# Patient Record
Sex: Female | Born: 1945 | Race: White | Hispanic: No | Smoking: Never smoker
Health system: Southern US, Community
[De-identification: ages and names within clinical notes are randomized; demographics above are authoritative.]

## PROBLEM LIST (undated history)

## (undated) DIAGNOSIS — M1990 Unspecified osteoarthritis, unspecified site: Secondary | ICD-10-CM

## (undated) DIAGNOSIS — E785 Hyperlipidemia, unspecified: Secondary | ICD-10-CM

## (undated) DIAGNOSIS — N189 Chronic kidney disease, unspecified: Secondary | ICD-10-CM

## (undated) DIAGNOSIS — F32A Depression, unspecified: Secondary | ICD-10-CM

## (undated) DIAGNOSIS — I10 Essential (primary) hypertension: Secondary | ICD-10-CM

## (undated) DIAGNOSIS — M810 Age-related osteoporosis without current pathological fracture: Secondary | ICD-10-CM

## (undated) DIAGNOSIS — H269 Unspecified cataract: Secondary | ICD-10-CM

## (undated) DIAGNOSIS — F329 Major depressive disorder, single episode, unspecified: Secondary | ICD-10-CM

## (undated) DIAGNOSIS — R0602 Shortness of breath: Secondary | ICD-10-CM

## (undated) HISTORY — DX: Age-related osteoporosis without current pathological fracture: M81.0

## (undated) HISTORY — DX: Unspecified cataract: H26.9

## (undated) HISTORY — PX: CATARACT EXTRACTION: SUR2

## (undated) HISTORY — PX: ABDOMINAL HYSTERECTOMY: SHX81

## (undated) HISTORY — PX: CHOLECYSTECTOMY: SHX55

## (undated) HISTORY — DX: Shortness of breath: R06.02

## (undated) HISTORY — DX: Essential (primary) hypertension: I10

## (undated) HISTORY — DX: Unspecified osteoarthritis, unspecified site: M19.90

## (undated) HISTORY — PX: BREAST REDUCTION SURGERY: SHX8

## (undated) HISTORY — DX: Hyperlipidemia, unspecified: E78.5

## (undated) HISTORY — PX: REDUCTION MAMMAPLASTY: SUR839

## (undated) HISTORY — PX: LIPOSUCTION EXTREMITIES: SUR830

---

## 2005-02-13 HISTORY — PX: KNEE ARTHROSCOPY: SUR90

## 2008-01-28 ENCOUNTER — Ambulatory Visit (HOSPITAL_COMMUNITY): Admission: RE | Admit: 2008-01-28 | Discharge: 2008-01-28 | Payer: Self-pay | Admitting: Internal Medicine

## 2008-03-16 HISTORY — PX: FACIAL COSMETIC SURGERY: SHX629

## 2008-03-31 ENCOUNTER — Other Ambulatory Visit: Admission: RE | Admit: 2008-03-31 | Discharge: 2008-03-31 | Payer: Self-pay | Admitting: Internal Medicine

## 2008-03-31 ENCOUNTER — Ambulatory Visit: Payer: Self-pay | Admitting: Internal Medicine

## 2008-05-04 ENCOUNTER — Ambulatory Visit: Payer: Self-pay | Admitting: Internal Medicine

## 2008-08-03 ENCOUNTER — Ambulatory Visit: Payer: Self-pay | Admitting: Internal Medicine

## 2008-10-27 ENCOUNTER — Ambulatory Visit: Payer: Self-pay | Admitting: Internal Medicine

## 2009-02-04 ENCOUNTER — Ambulatory Visit (HOSPITAL_COMMUNITY): Admission: RE | Admit: 2009-02-04 | Discharge: 2009-02-04 | Payer: Self-pay | Admitting: Internal Medicine

## 2009-02-17 ENCOUNTER — Encounter: Admission: RE | Admit: 2009-02-17 | Discharge: 2009-02-17 | Payer: Self-pay | Admitting: Internal Medicine

## 2009-06-13 HISTORY — PX: CARPAL TUNNEL RELEASE: SHX101

## 2009-06-13 HISTORY — PX: TRIGGER FINGER RELEASE: SHX641

## 2009-06-22 ENCOUNTER — Ambulatory Visit (HOSPITAL_BASED_OUTPATIENT_CLINIC_OR_DEPARTMENT_OTHER): Admission: RE | Admit: 2009-06-22 | Discharge: 2009-06-22 | Payer: Self-pay | Admitting: Orthopedic Surgery

## 2009-08-03 ENCOUNTER — Ambulatory Visit: Payer: Self-pay | Admitting: Internal Medicine

## 2009-09-07 ENCOUNTER — Ambulatory Visit: Payer: Self-pay | Admitting: Internal Medicine

## 2009-09-16 ENCOUNTER — Ambulatory Visit: Payer: Self-pay | Admitting: Internal Medicine

## 2010-01-19 ENCOUNTER — Ambulatory Visit
Admission: RE | Admit: 2010-01-19 | Discharge: 2010-01-19 | Payer: Self-pay | Source: Home / Self Care | Attending: Orthopedic Surgery | Admitting: Orthopedic Surgery

## 2010-02-08 ENCOUNTER — Ambulatory Visit
Admission: RE | Admit: 2010-02-08 | Discharge: 2010-02-08 | Payer: Self-pay | Source: Home / Self Care | Attending: Orthopedic Surgery | Admitting: Orthopedic Surgery

## 2010-02-11 ENCOUNTER — Ambulatory Visit (HOSPITAL_COMMUNITY)
Admission: RE | Admit: 2010-02-11 | Discharge: 2010-02-11 | Payer: Self-pay | Source: Home / Self Care | Attending: Internal Medicine | Admitting: Internal Medicine

## 2010-02-11 LAB — HM MAMMOGRAPHY

## 2010-03-01 ENCOUNTER — Ambulatory Visit
Admission: RE | Admit: 2010-03-01 | Discharge: 2010-03-01 | Payer: Self-pay | Source: Home / Self Care | Attending: Internal Medicine | Admitting: Internal Medicine

## 2010-03-07 ENCOUNTER — Ambulatory Visit (HOSPITAL_COMMUNITY)
Admission: RE | Admit: 2010-03-07 | Discharge: 2010-03-07 | Payer: Self-pay | Source: Home / Self Care | Attending: Orthopaedic Surgery | Admitting: Orthopaedic Surgery

## 2010-03-10 ENCOUNTER — Ambulatory Visit: Admit: 2010-03-10 | Payer: Self-pay | Admitting: Internal Medicine

## 2010-04-04 NOTE — Op Note (Signed)
  NAME:  Kelly [name], Kelly [name]             ACCOUNT NO.:  [id number]  MEDICAL RECORD NO.:  [id number]          PATIENT TYPE:  AMB  LOCATION:  DSC                          FACILITY:  [facility]  PHYSICIAN:  Cindee Salt, M.D.       DATE OF BIRTH:  10/31/1945  DATE OF PROCEDURE:  01/19/2010 DATE OF DISCHARGE:                              OPERATIVE REPORT   PREOPERATIVE DIAGNOSIS:  Stenosing tenosynovitis, right thumb.  POSTOPERATIVE DIAGNOSIS:  Stenosing tenosynovitis, right thumb.  OPERATION:  Release of A1 pulley right thumb.  SURGEON:  Cindee Salt, MD  ASSISTANT:  Carolyne Fiscal, RN  ANESTHESIA:  General with local infiltration.  ANESTHESIOLOGIST:  Bedelia Person, MD  HISTORY:  The patient is a 65 year old female with a history of triggering of the right thumb.  This has not responded to conservative treatment.  She has elected to undergo surgical decompression.  Pre, peri, postoperative course have been discussed along with risks and complications.  She is aware that there is no guarantee with the surgery, possibility of infection, recurrence of injury to arteries, nerves, tendons, incomplete relief of symptoms, dystrophy.  In the preoperative area, the patient is seen, the extremity marked by both the patient and surgeon, antibiotic given.  PROCEDURE:  The patient was brought to the operating room where a general anesthetic was carried out without difficulty.  She was prepped using ChloraPrep, supine position, right arm free.  A 3-minute dry time was allowed, time-out taken, confirming the patient and procedure, and a transverse incision was made over the A1 pulley of the right thumb, carried down through subcutaneous tissue.  Bleeders were electrocauterized.  Neurovascular structures identified and protected with a right-angle retractors.  A1 pulley was isolated.  An incision was then made on its radial aspect.  This was found to be markedly thickened with the deformity present to the FPL tendon.   On release, full flexion and extension without any further triggering was noted.  The wound was irrigated.  The skin closed with interrupted 5-0 Vicryl Rapide sutures. A local infiltration with 0.25% Marcaine without epinephrine was given, approximately 2 mL was used.  A sterile compressive dressing was applied with the fingers free.  The patient tolerated the procedure well and was taken to the recovery room for observation in satisfactory condition. She will be discharged home to return to the [facility] [place] in 1 week on Vicodin.          ______________________________ Cindee Salt, M.D.     GK/MEDQ  D:  01/19/2010  T:  01/19/2010  Job:  [id number]  cc:   Luanna Cole. Lenord Fellers, M.D.  Electronically Signed by Cindee Salt M.D. on 04/04/2010 12:32:26 PM

## 2010-04-04 NOTE — Op Note (Signed)
NAME:  Kelly [name], Kelly [name]             ACCOUNT NO.:  [id number]  MEDICAL RECORD NO.:  [id number]          PATIENT TYPE:  AMB  LOCATION:  DSC                          FACILITY:  [facility]  PHYSICIAN:  Cindee Salt, M.D.       DATE OF BIRTH:  [birth year]  DATE OF PROCEDURE:  02/08/2010 DATE OF DISCHARGE:                              OPERATIVE REPORT   PREOPERATIVE DIAGNOSES: 1. Carpal tunnel syndrome, left hand. 2. Stenosing tenosynovitis, left thumb.  POSTOPERATIVE DIAGNOSES: 1. Carpal tunnel syndrome, left hand. 2. Stenosing tenosynovitis, left thumb.  OPERATIONS: 1. Release of carpal tunnel, left hand. 2. Release of A1 pulley, left thumb.  SURGEON:  Cindee Salt, MD  ASSISTANT:  Carolyne Fiscal, RN  ANESTHESIA:  Forearm-based IV regional.  ANESTHESIOLOGIST:  Sheldon Silvan, MD  HISTORY:  The patient is a 65 year old female with a history of carpal tunnel syndrome, EMG nerve conductions positive.  She has elected to undergo surgical decompression in the preoperative area.  She is complaining of catching of her left thumb and is desirous having this released having had release done on the opposite side.  Pre, peri, and postoperative course have been discussed along with risks and complications.  She is aware that there is no guarantee with surgery, possibility of infection, recurrence injury to arteries, nerves, and tendons, incomplete relief of symptoms, and dystrophy.  In the preoperative area, the patient is seen, the extremity is marked by both the patient and surgeon, and antibiotic is given.  PROCEDURE:  The patient was brought to the operating room where a forearm-based IV regional anesthetic was carried out without difficulty. She was prepped using ChloraPrep, supine position with left arm free.  A 3-minute dry time was allowed.  A time-out was taken confirming the patient and procedure.  A transverse incision was made over the A1 pulley of left thumb and carried down through the  subcutaneous tissue. Bleeders were electrocauterized with bipolar.  Radial and ulnar neurovascular bundles were identified and protected to the radial side of the A1 pulley.  The A1 pulley was released with sharp dissection. Significant thickening was present.  No further lesions were identified. The thumb was placed through full range motion.  No further triggering was evident.  The wound was irrigated and closed with interrupted 5-0 Vicryl Rapide sutures.  A separate incision was then made longitudinally in the palm and carried down through the subcutaneous tissue.  Bleeders were again electrocauterized with bipolar.  Palmar fascia was split. She had some feeling prior to the incisions.  Both were injected with 0.25% Marcaine without epinephrine.  The wound was then deepened,  the flexor tendon to the ring little finger was identified after identification of the superficial palmar arch to the ulnar side of the carpal retinaculum.  This was incised with sharp dissection.  Right angle and Sewall retractor were placed between skin and forearm fascia. The fascia was released for approximately a centimeter and half proximal to the wrist crease under direct vision.  Canal was explored.  Air compression to the nerve was apparent with thickening of the tenosynovium tissue.  No further lesions were identified.  The wound  was irrigated.  The skin was closed with interrupted 5-0 Vicryl Rapide suture.  A further infiltration with 0.25% Marcaine was given, approximately 6 mL was used in total.  A sterile compressive dressing and splint to the wrist with the fingers and thumb free was applied.  On deflation of the tourniquet, all fingers immediately pinked.  She was taken to the recovery room for observation in satisfactory condition. She will be discharged home to return the [facility] [place] in 1 week on Vicodin.          ______________________________ Cindee Salt, M.D.     GK/MEDQ   D:  02/08/2010  T:  02/08/2010  Job:  [id number]  Electronically Signed by Cindee Salt M.D. on 04/04/2010 12:32:42 PM

## 2010-04-11 ENCOUNTER — Encounter: Payer: Self-pay | Admitting: Internal Medicine

## 2010-04-11 ENCOUNTER — Ambulatory Visit (INDEPENDENT_AMBULATORY_CARE_PROVIDER_SITE_OTHER): Payer: Medicare Other | Admitting: Internal Medicine

## 2010-04-11 ENCOUNTER — Other Ambulatory Visit: Payer: Self-pay | Admitting: Internal Medicine

## 2010-04-11 DIAGNOSIS — R197 Diarrhea, unspecified: Secondary | ICD-10-CM

## 2010-04-11 DIAGNOSIS — D509 Iron deficiency anemia, unspecified: Secondary | ICD-10-CM

## 2010-04-11 DIAGNOSIS — Z9283 Personal history of failed moderate sedation: Secondary | ICD-10-CM

## 2010-04-11 LAB — FERRITIN: Ferritin: 19.1 ng/mL (ref 10.0–291.0)

## 2010-04-12 ENCOUNTER — Ambulatory Visit (INDEPENDENT_AMBULATORY_CARE_PROVIDER_SITE_OTHER): Payer: Medicare Other | Admitting: Internal Medicine

## 2010-04-12 DIAGNOSIS — D649 Anemia, unspecified: Secondary | ICD-10-CM

## 2010-04-14 ENCOUNTER — Telehealth (INDEPENDENT_AMBULATORY_CARE_PROVIDER_SITE_OTHER): Payer: Self-pay

## 2010-04-15 ENCOUNTER — Ambulatory Visit: Payer: Medicare Other | Admitting: Nurse Practitioner

## 2010-04-15 ENCOUNTER — Telehealth: Payer: Self-pay | Admitting: Internal Medicine

## 2010-04-21 NOTE — Assessment & Plan Note (Signed)
Summary:  Iron deficiency anemia , diarrhea. Requesting ECL  (Dr.Baxley)   History of Present Illness Visit Type: Initial Consult Primary GI MD: Yancey Flemings MD Primary Provider: Luanna Cole. Lenord Fellers, MD Requesting Provider: Luanna Cole. Baxley, MD Chief Complaint: Patient complains of diarrhea for about 7 months and she has started taking hydrocodone for her knee and her diarrhea has resolved. She now says that she is constipated. History of Present Illness:    65 year old female with hypertension, hyperlipidemia, and osteoarthritis. She is sent today regarding recent problems with diarrhea and iron deficiency anemia. patient reports chronic use of Aleve for joint pain. She also takes a baby aspirin daily. she was having diarrhea for about 7 months.  due to renal insufficiency, leave was discontinued and hydrocodone started. Diarrhea resolved. Now some constipation. She apparently had a routine colonoscopy greater than 5 years ago [place]. The details. Recent laboratories revealed anemia with hemoglobin of 10.6. Normal MCV. Low at 25. No ferritin. Normal B12. No folate. GI review of systems is otherwise negative. She has had mild weight gain. No melena or hematochezia. No blood  donation.   GI Review of Systems      Denies abdominal pain, acid reflux, belching, bloating, chest pain, dysphagia with liquids, dysphagia with solids, heartburn, loss of appetite, nausea, vomiting, vomiting blood, weight loss, and  weight gain.      Reports change in bowel habits, constipation, and  diarrhea.     Denies anal fissure, black tarry stools, diverticulosis, fecal incontinence, heme positive stool, hemorrhoids, irritable bowel syndrome, jaundice, light color stool, liver problems, rectal bleeding, and  rectal pain. Preventive Screening-Counseling & Management  Alcohol-Tobacco     Smoking Status: never      Drug Use:  no.      Current Medications (verified): 1)  Vicodin 5-500 Mg Tabs  (Hydrocodone-Acetaminophen) .... Take One By Mouth Two Times A Day 2)  Lisinopril 20 Mg Tabs (Lisinopril) .Marland Kitchen.. 1 By Mouth Once Daily 3)  Crestor 20 Mg Tabs (Rosuvastatin Calcium) .Marland Kitchen.. 1 By Mouth Once Daily 4)  Fish Oil 1000 Mg Caps (Omega-3 Fatty Acids) .Marland Kitchen.. 1 By Mouth Once Daily 5)  Calcium 600 1500 Mg Tabs (Calcium Carbonate) .Marland Kitchen.. 1 By Mouth Once Daily 6)  Vitamin D3 1000 Unit Tabs (Cholecalciferol) .Marland Kitchen.. 1 By Mouth Once Daily 7)  Multivitamins  Tabs (Multiple Vitamin) .Marland Kitchen.. 1 By Mouth Once Daily 8)  Vitamin B-12 Cr 2000 Mcg Cr-Tabs (Cyanocobalamin) .Marland Kitchen.. 1 By Mouth Once Daily 9)  Aspirin 81 Mg Tbec (Aspirin) .Marland Kitchen.. 1 By Mouth Once Daily  Allergies (verified): No Known Drug Allergies  Past History:  Past Medical History: Iron Deficiency Anemia HTN Hyperlipidemia  Past Surgical History: Knee Replacement left Carpal Tunnel Release ad trigger fingers bilateral   Family History: No FH of Colon Cancer: Family History of Lung Cancer: Sister (smoker) Family History of Ovarian Cancer: daughter deceased Family History of Heart Disease: sister  Social History: Occupation: retired Patient has never smoked.  Alcohol Use - yes 2 per day Daily Caffeine Use 2 per day Illicit Drug Use - no Smoking Status:  never Drug Use:  no  Review of Systems       The patient complains of arthritis/joint pain and swelling of feet/legs.  The patient denies allergy/sinus, anemia, anxiety-new, back pain, blood in urine, breast changes/lumps, change in vision, confusion, cough, coughing up blood, depression-new, fainting, fatigue, fever, headaches-new, hearing problems, heart murmur, heart rhythm changes, itching, menstrual pain, muscle pains/cramps, night sweats, nosebleeds, pregnancy symptoms,  shortness of breath, skin rash, sleeping problems, sore throat, swollen lymph glands, thirst - excessive , urination - excessive , urination changes/pain, urine leakage, vision changes, and voice change.    Vital  Signs:  Patient profile:   65 year old female Height:      61 inches Weight:      204.6 pounds BMI:     38.80 Pulse rate:   76 / minute Pulse rhythm:   regular BP sitting:   120 / 62  (left arm) Cuff size:   regular  Vitals Entered By: Harlow Mares CMA Duncan Dull) (April 11, 2010 3:44 PM)  Physical Exam  General:  Well developed, well nourished, no acute distress. Head:  Normocephalic and atraumatic. Eyes:  PERRLA, no icterus. Ears:  Normal auditory acuity. Nose:  No deformity, discharge,  or lesions. Mouth:  No deformity or lesions, dentition normal. Neck:  Supple; no masses or thyromegaly. Lungs:  Clear throughout to auscultation. Heart:  Regular rate and rhythm; no murmurs, rubs,  or bruits. Abdomen:  Soft, nontender and nondistended. No masses, hepatosplenomegaly or hernias noted. Normal bowel sounds. Rectal:   deferred until colonoscopy Msk:  Symmetrical with no gross deformities. Normal posture. Pulses:  Normal pulses noted. Extremities:  No clubbing, cyanosis, edema or deformities noted. Neurologic:  Alert and  oriented x4;  grossly normal neurologically. Skin:  Intact without significant lesions or rashes. Psych:  Alert and cooperative. Normal mood and affect.   Impression & Recommendations:  Problem # 1:  UNSPECIFIED IRON DEFICIENCY ANEMIA (ICD-280.9)  anemia, possibly iron deficiency. This in the face of renal insufficiency. rule out GI blood loss. Risk factors include Ansaid/aspirin. Also, with diarrhea , rule out iron absorption problem such as celiac sprue   Plan : #1. tissue trans- glutaminase antibodies , ferritin level  #2. colonoscopy and upper endoscopy. The nature of the procedures as well as the risks, benefits, and alternatives were reviewed. she understood and agreed to proceed.  #3. movi the prep prescribed. The patient instructed on  its use  Problem # 2:  DIARRHEA (ICD-787.91)  previous problems with chronic diarrhea. They have been a side effect  of NSAIDs, such as microscopic colitis. Improve thereafter and with  hydrocodone. also, rule out celiac disease. plan as outlined above.  Problem # 3:  PERSONAL HISTORY OF FAILED MODERATE SEDATION (ICD-V15.80)  difficulties with her last procedure and [place] regarding adequate sedation. Will perform the exam with propofol /MAC  Other Orders: Colon/Endo (Colon/Endo) TLB-Ferritin ([id number]) T-Tissue Transglutamase Ab IgA ([id number])  Patient Instructions: 1)  Colon/Endo [facility] 04/22/10 with propoful at 11:00 am arrive at 10:00 am 2)  Movi prep instructions given. 3)  Movi prep prescription sent to your pharmacy for you to pick up. 4)  Labs ordered for you to have drawn today on basement floor. 5)  Colonoscopy and Flexible Sigmoidoscopy brochure given.  6)  Upper Endoscopy brochure given.  7)  Copy sent to : Luanna Cole. Lenord Fellers, MD 8)  The medication list was reviewed and reconciled.  All changed / newly prescribed medications were explained.  A complete medication list was provided to the patient / caregiver.

## 2010-04-21 NOTE — Letter (Signed)
Summary: [facility] Instructions  [facility]  [address] [place], [place] [address]   Phone: [phone]  Fax: [phone]       Kelly [name]    [birth year]    MRN: [id number]        Procedure Day /Date:FRIDAY, 04/22/10     Arrival Time:10:00 AM     Procedure Time:11:00 AM     Location of Procedure:                    X  [facility] (4th Floor)  PREPARATION FOR COLONOSCOPY WITH MOVIPREP/ENDO/WITH PROPOFUL   Starting 5 days prior to your procedure 04/17/10 do not eat nuts, seeds, popcorn, corn, beans, peas,  salads, or any raw vegetables.  Do not take any fiber supplements (e.g. Metamucil, Citrucel, and Benefiber).  THE DAY BEFORE YOUR PROCEDURE         DATE: 04/21/10 DAY: THURSDAY  1.  Drink clear liquids the entire day-NO SOLID FOOD  2.  Do not drink anything colored red or purple.  Avoid juices with pulp.  No orange juice.  3.  Drink at least 64 oz. (8 glasses) of fluid/clear liquids during the day to prevent dehydration and help the prep work efficiently.  CLEAR LIQUIDS INCLUDE: Water Jello Ice Popsicles Tea (sugar ok, no milk/cream) Powdered fruit flavored drinks Coffee (sugar ok, no milk/cream) Gatorade Juice: apple, white grape, white cranberry  Lemonade Clear bullion, consomm, broth Carbonated beverages (any kind) Strained chicken noodle soup Hard Candy                             4.  In the morning, mix first dose of MoviPrep solution:    Empty 1 Pouch A and 1 Pouch B into the disposable container    Add lukewarm drinking water to the top line of the container. Mix to dissolve    Refrigerate (mixed solution should be used within 24 hrs)  5.  Begin drinking the prep at 5:00 p.m. The MoviPrep container is divided by 4 marks.   Every 15 minutes drink the solution down to the next mark (approximately 8 oz) until the full liter is complete.   6.  Follow completed prep with 16 oz of clear liquid of your choice (Nothing red or  purple).  Continue to drink clear liquids until bedtime.  7.  Before going to bed, mix second dose of MoviPrep solution:    Empty 1 Pouch A and 1 Pouch B into the disposable container    Add lukewarm drinking water to the top line of the container. Mix to dissolve    Refrigerate  THE DAY OF YOUR PROCEDURE      DATE: 3/9/12DAY:FRIDAY  Beginning at 6:00 a.m. (5 hours before procedure):         1. Every 15 minutes, drink the solution down to the next mark (approx 8 oz) until the full liter is complete.  2. Follow completed prep with 16 oz. of clear liquid of your choice.    3. You may drink clear liquids until 9:00 AM (2 HOURS BEFORE PROCEDURE).   MEDICATION INSTRUCTIONS  Unless otherwise instructed, you should take regular prescription medications with a small sip of water   as early as possible the morning of your procedure.           OTHER INSTRUCTIONS  You will need a responsible adult at least 67 years of age to  accompany you and drive you home.   This person must remain in the waiting room during your procedure.  Wear loose fitting clothing that is easily removed.  Leave jewelry and other valuables at home.  However, you may wish to bring a book to read or  an iPod/MP3 player to listen to music as you wait for your procedure to start.  Remove all body piercing jewelry and leave at home.  Total time from sign-in until discharge is approximately 2-3 hours.  You should go home directly after your procedure and rest.  You can resume normal activities the  day after your procedure.  The day of your procedure you should not:   Drive   Make legal decisions   Operate machinery   Drink alcohol   Return to work  You will receive specific instructions about eating, activities and medications before you leave.    The above instructions have been reviewed and explained to me by   _______________________    I fully understand and can verbalize these  instructions _____________________________ Date _________

## 2010-04-21 NOTE — Progress Notes (Signed)
Summary: Diarrhea/abdominal pain  Phone Note Call from Patient Call back at Home Phone [phone]   Caller: Patient Call For: Dr. Marina Goodell Summary of Call: Diarrhea/Cramping  Initial call taken by: Selinda Michaels RN,  April 14, 2010 4:12 PM  Follow-up for Phone Call        Patient states that when she saw Dr. Marina Goodell she was not having pain or diarrhea. Patient states that Tuesday she started having diarrhea and abdominal pain. States that the pain is above her belly button all across her abdomen and it hits her right before she has diarrhea. Patient states she took a whole bottle of Kaopectate Tuesday. She did not eat anything yesterday but today she had her coffee, cereal, and fruit. She has been having diarrhea ever since, about every 10-15 min. Patient scheduled for colon with propofol on 04/22/10. Dr. Marina Goodell please advise. Follow-up by: Selinda Michaels RN,  April 14, 2010 4:15 PM  Additional Follow-up for Phone Call Additional follow up Details #1::        likely a gastroenteritis. Recommend fluids, immodium;  and rest. Make an appt w/ extender tomorrow or Monday. If she's better, she can cancel the extender visit... keep plans for scheduled procedures for now. Additional Follow-up by: Hilarie Fredrickson MD,  April 14, 2010 4:32 PM    Additional Follow-up for Phone Call Additional follow up Details #2::    Patient aware of Dr. Lamar Sprinkles recommendations. Patient given an appointment to see Willette Cluster, RNP 04/15/10@10am . Patient aware of appointment date and time. Follow-up by: Selinda Michaels RN,  April 14, 2010 4:40 PM

## 2010-04-21 NOTE — Progress Notes (Signed)
Summary: Appts  Phone Note Call from Patient Call back at Home Phone [phone]   Caller: Patient Call For: Willette Cluster Reason for Call: Talk to Nurse Summary of Call: Patient canceled her appt for today because she says that she is feeling better but she wants to move her procedure to earlier next week so she doesnt have to wait untill friday Initial call taken by: [place] Johnson,  April 15, 2010 8:29 AM  Follow-up for Phone Call        Spoke with patient and explained that with the "special anesthesia" she would be getting, we cannot move her procedure to eariler in the week.  Follow-up by: Jesse Fall RN,  April 15, 2010 10:37 AM    New/Updated Medications: MOVIPREP 100 GM  SOLR (PEG-KCL-NACL-NASULF-NA ASC-C) As per prep instructions. Prescriptions: MOVIPREP 100 GM  SOLR (PEG-KCL-NACL-NASULF-NA ASC-C) As per prep instructions.  #1 x 0   Entered by:   Milford Cage NCMA   Authorized by:   Hilarie Fredrickson MD   Signed by:   Milford Cage NCMA on 04/15/2010   Method used:   Electronically to        [id number] (retail)       [address] [place], [place]  [address]       Ph: [phone]       Fax: [phone]   RxID:   [id number]

## 2010-04-22 ENCOUNTER — Encounter: Payer: Self-pay | Admitting: Internal Medicine

## 2010-04-22 ENCOUNTER — Other Ambulatory Visit: Payer: Self-pay | Admitting: Internal Medicine

## 2010-04-22 ENCOUNTER — Encounter (AMBULATORY_SURGERY_CENTER): Payer: Medicare Other | Admitting: Internal Medicine

## 2010-04-22 DIAGNOSIS — D649 Anemia, unspecified: Secondary | ICD-10-CM

## 2010-04-22 DIAGNOSIS — D126 Benign neoplasm of colon, unspecified: Secondary | ICD-10-CM

## 2010-04-22 DIAGNOSIS — R197 Diarrhea, unspecified: Secondary | ICD-10-CM

## 2010-04-25 LAB — BASIC METABOLIC PANEL
BUN: 24 mg/dL — ABNORMAL HIGH (ref 6–23)
CO2: 27 mEq/L (ref 19–32)
Calcium: 9.3 mg/dL (ref 8.4–10.5)
Creatinine, Ser: 1.73 mg/dL — ABNORMAL HIGH (ref 0.4–1.2)
GFR calc non Af Amer: 29 mL/min — ABNORMAL LOW (ref >60)
Glucose, Bld: 118 mg/dL — ABNORMAL HIGH (ref 70–99)

## 2010-04-26 LAB — BASIC METABOLIC PANEL
BUN: 14 mg/dL (ref 6–23)
CO2: 26 mEq/L (ref 19–32)
Calcium: 9.3 mg/dL (ref 8.4–10.5)
Creatinine, Ser: 1.36 mg/dL — ABNORMAL HIGH (ref 0.4–1.2)
Glucose, Bld: 98 mg/dL (ref 70–99)
Sodium: 138 mEq/L (ref 135–145)

## 2010-04-26 NOTE — Procedures (Addendum)
Summary: Upper Endoscopy  Patient: Kelly [name] Note: All result statuses are Final unless otherwise noted.  Tests: (1) Upper Endoscopy (EGD)   EGD Upper Endoscopy       DONE     [facility]     [address]     [place], [place]  [address]          ENDOSCOPY PROCEDURE REPORT          PATIENT:  Kelly, [name]  MR#:  [id number]     BIRTHDATE:  April 10, 1945, 64 yrs. old  GENDER:  female          ENDOSCOPIST:  Wilhemina Bonito. Eda Keys, MD     Referred by:  Sharlet Salina, M.D.          PROCEDURE DATE:  04/22/2010     PROCEDURE:  EGD with biopsy, [id number]     ASA CLASS:  Class II     INDICATIONS:  normocytic anemia, diarrhea .  Normal B12, Ferritin,     and TTG-ab          MEDICATIONS:   MAC sedation, administered by CRNA, There was     residual sedation effect present from prior procedure., propofol     (Diprivan) 180 mg IV     TOPICAL ANESTHETIC:  Exactacain Spray          DESCRIPTION OF PROCEDURE:   After the risks benefits and     alternatives of the procedure were thoroughly explained, informed     consent was obtained.  The LB GIF-H180 [id number] endoscope was     introduced through the mouth and advanced to the third portion of     the duodenum, without limitations.  The instrument was slowly     withdrawn as the mucosa was fully examined.     <<PROCEDUREIMAGES>>          The upper, middle, and distal third of the esophagus were     carefully inspected and no abnormalities were noted. The z-line     was well seen at the GEJ. The endoscope was pushed into the fundus     which was normal including a retroflexed view. The antrum,gastric     body (somewhat atrophic), first and second part of the duodenum     were unremarkable.    Retroflexed views revealed no abnormalities.     The scope was then withdrawn from the patient and the procedure     completed.          COMPLICATIONS:  None          ENDOSCOPIC IMPRESSION:     1) Normal EGD     2) No GI cause for  normocytic anemia found          RECOMMENDATIONS:     1) Await biopsy results     2) Return to the care of Dr. Lenord Fellers for further assessment of     anemia (might consider hematology opinion)          ______________________________     Wilhemina Bonito. Eda Keys, MD          CC:  Sharlet Salina, MD;  The Patient          n.     eSIGNED:   Wilhemina Bonito. Eda Keys at 04/22/2010 12:09 PM          Merril Abbe, [id number]  Note: An exclamation mark (!) indicates a result that was not dispersed into  the flowsheet. Document Creation Date: 04/22/2010 12:09 PM _______________________________________________________________________  (1) Order result status: Final Collection or observation date-time: 04/22/2010 11:56 Requested date-time:  Receipt date-time:  Reported date-time:  Referring Physician:   Ordering Physician: Fransico Setters [phone]) Specimen Source:  Source: Launa Grill Order Number: [id number] Lab site:

## 2010-04-26 NOTE — Procedures (Addendum)
Summary: Colonoscopy  Patient: Kelly [name] Note: All result statuses are Final unless otherwise noted.  Tests: (1) Colonoscopy (COL)   COL Colonoscopy           DONE     [facility]     [address]     [place], [place]  [address]          COLONOSCOPY PROCEDURE REPORT          PATIENT:  [name], [name]  MR#:  [id number]     BIRTHDATE:  27-Dec-1945, 64 yrs. old  GENDER:  female     ENDOSCOPIST:  Wilhemina Bonito. Eda Keys, MD     REF. BY:  Sharlet Salina, M.D.     PROCEDURE DATE:  04/22/2010     PROCEDURE:  Colonoscopy with biopsies     ASA CLASS:  Class II     INDICATIONS:  Anemia, unexplained diarrhea     MEDICATIONS:   MAC sedation, administered by CRNA, propofol     (Diprivan) 230 mcg IV          DESCRIPTION OF PROCEDURE:   After the risks benefits and     alternatives of the procedure were thoroughly explained, informed     consent was obtained.  Digital rectal exam was performed and     revealed no abnormalities.   The LB 180AL [id number] endoscope was     introduced through the anus and advanced to the cecum, which was     identified by both the appendix and ileocecal valve, without     limitations.Time to cecum = 7:49 min. The quality of the prep was     good, using MiraLax.  The instrument was then slowly withdrawn     (time = 13:52 min) as the colon was fully examined.     <<PROCEDUREIMAGES>>          FINDINGS:  The terminal ileum appeared normal.  A normal appearing     cecum, ileocecal valve, and appendiceal orifice were identified.     The ascending, hepatic flexure, transverse, splenic flexure,     descending, sigmoid colon, and rectum appeared unremarkable.     Retroflexed views in the rectum revealed no abnormalities.Random     colon bx taken.    The scope was then withdrawn from the patient     and the procedure completed.          COMPLICATIONS:  None          ENDOSCOPIC IMPRESSION:     1) Normal terminal ileum     2) Normal colon       RECOMMENDATIONS:     1) Await biopsy results     2) Continue current colorectal screening recommendations for     "routine risk" patients with a repeat colonoscopy in 10 years.     3) Upper endoscopy will be performed today          ______________________________     Wilhemina Bonito. Eda Keys, MD          CC:  Sharlet Salina, MD The Patient          n.     eSIGNED:   Wilhemina Bonito. Eda Keys at 04/22/2010 11:51 AM          Merril Abbe, [id number]  Note: An exclamation mark (!) indicates a result that was not dispersed into the flowsheet. Document Creation Date: 04/22/2010 11:52 AM _______________________________________________________________________  (1) Order result status: Final Collection  or observation date-time: 04/22/2010 11:43 Requested date-time:  Receipt date-time:  Reported date-time:  Referring Physician:   Ordering Physician: Fransico Setters [phone]) Specimen Source:  Source: Launa Grill Order Number: [id number] Lab site:   Appended Document: Colonoscopy     Procedures Next Due Date:    Colonoscopy: 04/2020

## 2010-04-30 ENCOUNTER — Encounter: Payer: Self-pay | Admitting: Internal Medicine

## 2010-05-03 LAB — BASIC METABOLIC PANEL
CO2: 29 mEq/L (ref 19–32)
Chloride: 108 mEq/L (ref 96–112)
Creatinine, Ser: 1.31 mg/dL — ABNORMAL HIGH (ref 0.4–1.2)
GFR calc Af Amer: 49 mL/min — ABNORMAL LOW (ref >60)
Glucose, Bld: 97 mg/dL (ref 70–99)

## 2010-05-03 NOTE — Letter (Addendum)
Summary: Patient Notice- Colon Biospy Results  [facility]  [address] [place], [place] [address]   Phone: [phone]  Fax: [phone]        April 30, 2010 MRN: [id number]    [facility] [address] [place], [place]  [address]    Dear Kelly [name],  I am pleased to inform you that the biopsies taken during your recent colonoscopy and upper endoscopy were normal did not show any evidence of problems leading to anemia or diarrhea.  Additional information/recommendations:  __No further action is needed at this time.  Please follow-up with      your primary care physician for further assessment of anemia and your other healthcare needs.   __You should have a repeat colonoscopy examination for routine cancer screening in 10 years.    Please call us if you are having persistent problems or have questions about your condition that have not been fully answered at this time.  Sincerely,  Hilarie Fredrickson MD   This letter has been electronically signed by your physician.  Appended Document: Patient Notice- Colon Biospy Results letter mailed

## 2010-06-21 ENCOUNTER — Telehealth: Payer: Self-pay | Admitting: Internal Medicine

## 2010-06-21 NOTE — Telephone Encounter (Signed)
Call in Rx to [facility] as we have no samples. Give 3 month supply. Dose is 5 mg daily.

## 2010-07-13 ENCOUNTER — Other Ambulatory Visit: Payer: Self-pay | Admitting: Internal Medicine

## 2010-07-14 ENCOUNTER — Other Ambulatory Visit: Payer: Self-pay

## 2010-07-14 MED ORDER — HYDROCODONE-ACETAMINOPHEN 5-500 MG PO TABS: 1.0000 | ORAL_TABLET | Freq: Four times a day (QID) | ORAL | Status: DC | PRN

## 2010-10-07 ENCOUNTER — Encounter: Payer: Self-pay | Admitting: Internal Medicine

## 2010-10-11 ENCOUNTER — Other Ambulatory Visit: Payer: Medicare Other | Admitting: Internal Medicine

## 2010-10-11 DIAGNOSIS — E785 Hyperlipidemia, unspecified: Secondary | ICD-10-CM

## 2010-10-11 DIAGNOSIS — E119 Type 2 diabetes mellitus without complications: Secondary | ICD-10-CM

## 2010-10-12 LAB — HEPATIC FUNCTION PANEL
ALT: 11 U/L (ref 0–35)
AST: 16 U/L (ref 0–37)
Albumin: 4.2 g/dL (ref 3.5–5.2)
Total Bilirubin: 0.6 mg/dL (ref 0.3–1.2)

## 2010-10-12 LAB — LIPID PANEL
HDL: 29 mg/dL — ABNORMAL LOW (ref >39)
Total CHOL/HDL Ratio: 6.3 Ratio
VLDL: 45 mg/dL — ABNORMAL HIGH (ref 0–40)

## 2010-10-12 LAB — HEMOGLOBIN A1C: Hgb A1c MFr Bld: 5.7 % — ABNORMAL HIGH (ref <5.7)

## 2010-10-13 ENCOUNTER — Ambulatory Visit (INDEPENDENT_AMBULATORY_CARE_PROVIDER_SITE_OTHER): Payer: Medicare Other | Admitting: Internal Medicine

## 2010-10-13 ENCOUNTER — Encounter: Payer: Self-pay | Admitting: Internal Medicine

## 2010-10-13 VITALS — BP 118/72 | HR 72 | Temp 97.2°F | Ht 61.0 in | Wt 189.0 lb

## 2010-10-13 DIAGNOSIS — D649 Anemia, unspecified: Secondary | ICD-10-CM

## 2010-10-13 DIAGNOSIS — M1990 Unspecified osteoarthritis, unspecified site: Secondary | ICD-10-CM | POA: Insufficient documentation

## 2010-10-13 DIAGNOSIS — I10 Essential (primary) hypertension: Secondary | ICD-10-CM | POA: Insufficient documentation

## 2010-10-13 DIAGNOSIS — E785 Hyperlipidemia, unspecified: Secondary | ICD-10-CM | POA: Insufficient documentation

## 2010-10-13 DIAGNOSIS — E119 Type 2 diabetes mellitus without complications: Secondary | ICD-10-CM

## 2010-10-13 DIAGNOSIS — N289 Disorder of kidney and ureter, unspecified: Secondary | ICD-10-CM

## 2010-10-13 LAB — CBC WITH DIFFERENTIAL/PLATELET
Basophils Relative: 0 % (ref 0–1)
Eosinophils Absolute: 0.1 10*3/uL (ref 0.0–0.7)
MCH: 29.4 pg (ref 26.0–34.0)
MCHC: 32.3 g/dL (ref 30.0–36.0)
Neutrophils Relative %: 50 % (ref 43–77)
Platelets: 245 10*3/uL (ref 150–400)
RBC: 4.25 MIL/uL (ref 3.87–5.11)

## 2010-10-13 NOTE — Progress Notes (Signed)
  Subjective:    Patient ID: Kelly [name], female    DOB: 12/08/1945, 65 y.o.   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 She has had recent fasting lab work which was reviewed with her today and is within normal limits. Hemoglobin A1c is stable on diet alone. She's taking Crestor 10 mg daily because she said Lipitor caused arthralgias. Still has musculoskeletal pain in her knees due to osteoarthritis. We don't want her taking anti-inflammatory medications to have prescribed Vicodin 5/500 #61 by mouth every 12 hours when necessary pain with 3 refills. She had arthroscopic surgery of her left knee January 2012.  History of face lift with laser peel February 2010, total left knee replacement and right knee arthroscopy 2007, cholecystectomy 1965, hysterectomy without oophorectomy at age 28, liposuction of the thighs breast reduction and lift in the remote past, right carpal tunnel release right fourth finger trigger finger performed May 2011    Review of Systems     Objective:   Physical Exam  chest clear to auscultation; cardiac exam regular rate and rhythm; extremities without edema; diabetic foot exam negative        Assessment & Plan:    Hypertension  Hyperlipidemia  Osteoarthritis of the knees  Mild renal insufficiency  Iron 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

## 2010-10-14 ENCOUNTER — Other Ambulatory Visit: Payer: Self-pay | Admitting: Internal Medicine

## 2011-01-09 ENCOUNTER — Other Ambulatory Visit: Payer: Self-pay | Admitting: Internal Medicine

## 2011-03-15 ENCOUNTER — Other Ambulatory Visit: Payer: Self-pay | Admitting: Internal Medicine

## 2011-03-15 DIAGNOSIS — Z1231 Encounter for screening mammogram for malignant neoplasm of breast: Secondary | ICD-10-CM

## 2011-04-11 ENCOUNTER — Ambulatory Visit (HOSPITAL_COMMUNITY)
Admission: RE | Admit: 2011-04-11 | Discharge: 2011-04-11 | Disposition: A | Discharge status: Home / Self Care | Payer: Medicare Other | Source: Ambulatory Visit | Attending: Internal Medicine | Admitting: Internal Medicine

## 2011-04-11 DIAGNOSIS — Z1231 Encounter for screening mammogram for malignant neoplasm of breast: Secondary | ICD-10-CM | POA: Diagnosis not present

## 2011-04-12 ENCOUNTER — Ambulatory Visit (HOSPITAL_COMMUNITY): Payer: Medicare Other

## 2011-04-17 ENCOUNTER — Other Ambulatory Visit: Payer: Self-pay | Admitting: Internal Medicine

## 2011-04-20 ENCOUNTER — Ambulatory Visit (INDEPENDENT_AMBULATORY_CARE_PROVIDER_SITE_OTHER): Payer: Medicare Other | Admitting: Internal Medicine

## 2011-04-20 ENCOUNTER — Encounter: Payer: Self-pay | Admitting: Internal Medicine

## 2011-04-20 DIAGNOSIS — I10 Essential (primary) hypertension: Secondary | ICD-10-CM | POA: Diagnosis not present

## 2011-04-20 DIAGNOSIS — E559 Vitamin D deficiency, unspecified: Secondary | ICD-10-CM | POA: Diagnosis not present

## 2011-04-20 DIAGNOSIS — Z0000 Encounter for general adult medical examination without abnormal findings: Secondary | ICD-10-CM

## 2011-04-20 DIAGNOSIS — E1169 Type 2 diabetes mellitus with other specified complication: Secondary | ICD-10-CM

## 2011-04-20 DIAGNOSIS — E119 Type 2 diabetes mellitus without complications: Secondary | ICD-10-CM

## 2011-04-20 DIAGNOSIS — E785 Hyperlipidemia, unspecified: Secondary | ICD-10-CM | POA: Diagnosis not present

## 2011-04-20 DIAGNOSIS — E669 Obesity, unspecified: Secondary | ICD-10-CM

## 2011-04-20 DIAGNOSIS — Z23 Encounter for immunization: Secondary | ICD-10-CM | POA: Diagnosis not present

## 2011-04-20 LAB — POCT URINALYSIS DIPSTICK
Bilirubin, UA: NEGATIVE
Blood, UA: NEGATIVE
Glucose, UA: NEGATIVE
Nitrite, UA: NEGATIVE
Spec Grav, UA: 1.02

## 2011-04-20 NOTE — Patient Instructions (Signed)
We will review your lab work and make further recommendations. Return in 6 months for labs and office visit. He had been given pneumonia immunization and tetanus immunization today.

## 2011-04-20 NOTE — Progress Notes (Signed)
Subjective:    Patient ID: Kelly [name], female    DOB: 1945-06-15, 66 y.o.   MRN: [id number]  HPI pleasant 66 year old white female presents for health maintenance exam. History of diabetes mellitus, hypertension, hyperlipidemia. History of iron deficiency resolved when she stopped taking NSAIDS. Patient had colonoscopy by Dr. Yancey Flemings on 04/22/2010. She is on lisinopril, Crestor 10 mg daily, baby aspirin daily, hydrocodone 5/500 daily for osteoarthritis pain. History of chronic kidney disease stage I. Creatinine runs around 1.5.  Patient's daughter died of metastatic ovarian cancer. Patient was raised in an orphanage. She formerly worked for the [employer] and has had several jobs since. Was unemployed for a while and just recently found a job at [employer]. It's part-time work but she's pleased to find work at all. Patient says she went in today it to pay for her daughter's medical expenses as she was uninsured.  Patient has had lots of plastic surgery 2002 and 2003 including an abdominoplasty, thigh liposuction, mastopexy, brachioplasties. 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 Merlyn Lot December 2011. History of carpal tunnel syndrome right hand.  History of face lift with laser peel 2010, total left knee replacement and right knee arthroscopy 2007, cholecystectomy 1965, hysterectomy without oophorectomy at age 39.  Family history father died of alcoholism. Mother died of heart disease. One sister with lung cancer. 3 other sisters in good health.  Social history retired from [employer]. Is divorced. Drinks 2 glasses of wine daily. Does not smoke.    Review of Systems only complaints are musculoskeletal pain controlled with one tablet daily of Vicodin. Also beginning to have hot flashes once again.     Objective:   Physical Exam  Nursing note and vitals reviewed. Constitutional: She is oriented to person, place, and time. She  appears well-developed and well-nourished.  HENT:  Head: Normocephalic and atraumatic.  Right Ear: External ear normal.  Left Ear: External ear normal.  Mouth/Throat: Oropharynx is clear and moist.  Eyes: Conjunctivae and EOM are normal. Pupils are equal, round, and reactive to light. Right eye exhibits no discharge. Left eye exhibits no discharge.  Neck: Neck supple. No JVD present. No thyromegaly present.  Cardiovascular: Normal rate, regular rhythm and normal heart sounds.   No murmur heard. Pulmonary/Chest: Effort normal and breath sounds normal. She has no wheezes. She has no rales.       Breasts normal female status post breast reduction  Abdominal: Soft. Bowel sounds are normal. She exhibits no mass. There is no rebound.  Genitourinary:       Bimanual exam normal. Patient is status post hysterectomy. Last Pap smear was 03/31/2008.  Musculoskeletal: She exhibits no edema.  Lymphadenopathy:    She has no cervical adenopathy.  Neurological: She is alert and oriented to person, place, and time. She has normal reflexes. No cranial nerve deficit.  Skin: Skin is warm and dry.  Psychiatric: She has a normal mood and affect. Her behavior is normal.          Assessment & Plan:  Osteoarthritis  Hypertension  Hyperlipidemia  Adult onset diabetes mellitus  Chronic kidney disease stage I  Remote history of iron deficiency anemia likely related to NSAID therapy.  Plan: Tetanus immunization update and Pneumovax immunization given today. She had shingles vaccine done in 2009.  Crestor samples and Crestor savings card given today. New prescription for Crestor 10 mg daily with when necessary one year refills. New prescription for Prinzide  20/25 with refills. Continue to watch serum creatinine. Return in 6 months. Will need lipid panel liver functions hemoglobin A1c in be met. I do note creatinine and February 2010 was 1.23. He July 2011 she had an episode of food poisoning with acute  dehydration and creatinine was elevated at 2.77 but returned to 1.57 on January 2012. Iron and iron binding capacity were normal in February 2012. I do not have old lab records prior to 2010. We started seeing her in 2010. Labs are drawn today and are pending. Will need to make a decision as to whether to keep her on ACE inhibitor or not based on creatinine.

## 2011-04-21 LAB — LIPID PANEL
Cholesterol: 164 mg/dL (ref 0–200)
HDL: 35 mg/dL — ABNORMAL LOW (ref >39)
Total CHOL/HDL Ratio: 4.7 Ratio

## 2011-04-21 LAB — CBC WITH DIFFERENTIAL/PLATELET
Basophils Relative: 0 % (ref 0–1)
Eosinophils Absolute: 0.2 10*3/uL (ref 0.0–0.7)
HCT: 38.4 % (ref 36.0–46.0)
Hemoglobin: 11.7 g/dL — ABNORMAL LOW (ref 12.0–15.0)
MCH: 29.1 pg (ref 26.0–34.0)
MCHC: 30.5 g/dL (ref 30.0–36.0)
Monocytes Absolute: 0.3 10*3/uL (ref 0.1–1.0)
Monocytes Relative: 5 % (ref 3–12)
Neutrophils Relative %: 52 % (ref 43–77)

## 2011-04-21 LAB — COMPREHENSIVE METABOLIC PANEL
Alkaline Phosphatase: 96 U/L (ref 39–117)
BUN: 23 mg/dL (ref 6–23)
Creat: 1.41 mg/dL — ABNORMAL HIGH (ref 0.50–1.10)
Glucose, Bld: 85 mg/dL (ref 70–99)
Total Bilirubin: 0.6 mg/dL (ref 0.3–1.2)

## 2011-04-21 LAB — MICROALBUMIN, URINE: Microalb, Ur: 0.85 mg/dL (ref 0.00–1.89)

## 2011-04-21 LAB — TSH: TSH: 2.468 u[IU]/mL (ref 0.350–4.500)

## 2011-04-21 LAB — HEMOGLOBIN A1C: Mean Plasma Glucose: 114 mg/dL (ref <117)

## 2011-07-08 ENCOUNTER — Other Ambulatory Visit: Payer: Self-pay | Admitting: Internal Medicine

## 2011-07-29 ENCOUNTER — Other Ambulatory Visit: Payer: Self-pay | Admitting: Internal Medicine

## 2011-08-04 DIAGNOSIS — IMO0002 Reserved for concepts with insufficient information to code with codable children: Secondary | ICD-10-CM | POA: Diagnosis not present

## 2011-08-04 DIAGNOSIS — M5137 Other intervertebral disc degeneration, lumbosacral region: Secondary | ICD-10-CM | POA: Diagnosis not present

## 2011-08-04 DIAGNOSIS — M999 Biomechanical lesion, unspecified: Secondary | ICD-10-CM | POA: Diagnosis not present

## 2011-08-04 DIAGNOSIS — M5380 Other specified dorsopathies, site unspecified: Secondary | ICD-10-CM | POA: Diagnosis not present

## 2011-08-07 DIAGNOSIS — M999 Biomechanical lesion, unspecified: Secondary | ICD-10-CM | POA: Diagnosis not present

## 2011-08-07 DIAGNOSIS — M5380 Other specified dorsopathies, site unspecified: Secondary | ICD-10-CM | POA: Diagnosis not present

## 2011-08-07 DIAGNOSIS — IMO0002 Reserved for concepts with insufficient information to code with codable children: Secondary | ICD-10-CM | POA: Diagnosis not present

## 2011-08-07 DIAGNOSIS — M5137 Other intervertebral disc degeneration, lumbosacral region: Secondary | ICD-10-CM | POA: Diagnosis not present

## 2011-08-09 DIAGNOSIS — M5137 Other intervertebral disc degeneration, lumbosacral region: Secondary | ICD-10-CM | POA: Diagnosis not present

## 2011-08-09 DIAGNOSIS — IMO0002 Reserved for concepts with insufficient information to code with codable children: Secondary | ICD-10-CM | POA: Diagnosis not present

## 2011-08-09 DIAGNOSIS — M999 Biomechanical lesion, unspecified: Secondary | ICD-10-CM | POA: Diagnosis not present

## 2011-08-09 DIAGNOSIS — M5380 Other specified dorsopathies, site unspecified: Secondary | ICD-10-CM | POA: Diagnosis not present

## 2011-08-10 DIAGNOSIS — M999 Biomechanical lesion, unspecified: Secondary | ICD-10-CM | POA: Diagnosis not present

## 2011-08-10 DIAGNOSIS — IMO0002 Reserved for concepts with insufficient information to code with codable children: Secondary | ICD-10-CM | POA: Diagnosis not present

## 2011-08-10 DIAGNOSIS — M5137 Other intervertebral disc degeneration, lumbosacral region: Secondary | ICD-10-CM | POA: Diagnosis not present

## 2011-08-10 DIAGNOSIS — M5380 Other specified dorsopathies, site unspecified: Secondary | ICD-10-CM | POA: Diagnosis not present

## 2011-08-14 DIAGNOSIS — M5137 Other intervertebral disc degeneration, lumbosacral region: Secondary | ICD-10-CM | POA: Diagnosis not present

## 2011-08-14 DIAGNOSIS — M5380 Other specified dorsopathies, site unspecified: Secondary | ICD-10-CM | POA: Diagnosis not present

## 2011-08-14 DIAGNOSIS — IMO0002 Reserved for concepts with insufficient information to code with codable children: Secondary | ICD-10-CM | POA: Diagnosis not present

## 2011-08-14 DIAGNOSIS — M999 Biomechanical lesion, unspecified: Secondary | ICD-10-CM | POA: Diagnosis not present

## 2011-08-21 DIAGNOSIS — M5137 Other intervertebral disc degeneration, lumbosacral region: Secondary | ICD-10-CM | POA: Diagnosis not present

## 2011-08-21 DIAGNOSIS — M5380 Other specified dorsopathies, site unspecified: Secondary | ICD-10-CM | POA: Diagnosis not present

## 2011-08-21 DIAGNOSIS — IMO0002 Reserved for concepts with insufficient information to code with codable children: Secondary | ICD-10-CM | POA: Diagnosis not present

## 2011-08-21 DIAGNOSIS — M999 Biomechanical lesion, unspecified: Secondary | ICD-10-CM | POA: Diagnosis not present

## 2011-08-24 DIAGNOSIS — M5137 Other intervertebral disc degeneration, lumbosacral region: Secondary | ICD-10-CM | POA: Diagnosis not present

## 2011-08-24 DIAGNOSIS — M5380 Other specified dorsopathies, site unspecified: Secondary | ICD-10-CM | POA: Diagnosis not present

## 2011-08-24 DIAGNOSIS — IMO0002 Reserved for concepts with insufficient information to code with codable children: Secondary | ICD-10-CM | POA: Diagnosis not present

## 2011-08-24 DIAGNOSIS — M999 Biomechanical lesion, unspecified: Secondary | ICD-10-CM | POA: Diagnosis not present

## 2011-08-27 ENCOUNTER — Other Ambulatory Visit: Payer: Self-pay | Admitting: Internal Medicine

## 2011-08-28 DIAGNOSIS — M5137 Other intervertebral disc degeneration, lumbosacral region: Secondary | ICD-10-CM | POA: Diagnosis not present

## 2011-08-28 DIAGNOSIS — IMO0002 Reserved for concepts with insufficient information to code with codable children: Secondary | ICD-10-CM | POA: Diagnosis not present

## 2011-08-28 DIAGNOSIS — M5380 Other specified dorsopathies, site unspecified: Secondary | ICD-10-CM | POA: Diagnosis not present

## 2011-08-28 DIAGNOSIS — M999 Biomechanical lesion, unspecified: Secondary | ICD-10-CM | POA: Diagnosis not present

## 2011-08-31 DIAGNOSIS — IMO0002 Reserved for concepts with insufficient information to code with codable children: Secondary | ICD-10-CM | POA: Diagnosis not present

## 2011-08-31 DIAGNOSIS — M999 Biomechanical lesion, unspecified: Secondary | ICD-10-CM | POA: Diagnosis not present

## 2011-08-31 DIAGNOSIS — M5380 Other specified dorsopathies, site unspecified: Secondary | ICD-10-CM | POA: Diagnosis not present

## 2011-08-31 DIAGNOSIS — M5137 Other intervertebral disc degeneration, lumbosacral region: Secondary | ICD-10-CM | POA: Diagnosis not present

## 2011-09-04 DIAGNOSIS — M5137 Other intervertebral disc degeneration, lumbosacral region: Secondary | ICD-10-CM | POA: Diagnosis not present

## 2011-09-04 DIAGNOSIS — M999 Biomechanical lesion, unspecified: Secondary | ICD-10-CM | POA: Diagnosis not present

## 2011-09-04 DIAGNOSIS — IMO0002 Reserved for concepts with insufficient information to code with codable children: Secondary | ICD-10-CM | POA: Diagnosis not present

## 2011-09-04 DIAGNOSIS — M5380 Other specified dorsopathies, site unspecified: Secondary | ICD-10-CM | POA: Diagnosis not present

## 2011-09-06 DIAGNOSIS — M5137 Other intervertebral disc degeneration, lumbosacral region: Secondary | ICD-10-CM | POA: Diagnosis not present

## 2011-09-06 DIAGNOSIS — IMO0002 Reserved for concepts with insufficient information to code with codable children: Secondary | ICD-10-CM | POA: Diagnosis not present

## 2011-09-06 DIAGNOSIS — M5380 Other specified dorsopathies, site unspecified: Secondary | ICD-10-CM | POA: Diagnosis not present

## 2011-09-06 DIAGNOSIS — M999 Biomechanical lesion, unspecified: Secondary | ICD-10-CM | POA: Diagnosis not present

## 2011-09-11 DIAGNOSIS — M5380 Other specified dorsopathies, site unspecified: Secondary | ICD-10-CM | POA: Diagnosis not present

## 2011-09-11 DIAGNOSIS — M5137 Other intervertebral disc degeneration, lumbosacral region: Secondary | ICD-10-CM | POA: Diagnosis not present

## 2011-09-11 DIAGNOSIS — M999 Biomechanical lesion, unspecified: Secondary | ICD-10-CM | POA: Diagnosis not present

## 2011-09-11 DIAGNOSIS — IMO0002 Reserved for concepts with insufficient information to code with codable children: Secondary | ICD-10-CM | POA: Diagnosis not present

## 2011-09-22 DIAGNOSIS — M25469 Effusion, unspecified knee: Secondary | ICD-10-CM | POA: Diagnosis not present

## 2011-09-22 DIAGNOSIS — Z96659 Presence of unspecified artificial knee joint: Secondary | ICD-10-CM | POA: Diagnosis not present

## 2011-09-22 DIAGNOSIS — M25569 Pain in unspecified knee: Secondary | ICD-10-CM | POA: Diagnosis not present

## 2011-09-27 ENCOUNTER — Other Ambulatory Visit (HOSPITAL_COMMUNITY): Payer: Self-pay | Admitting: Orthopaedic Surgery

## 2011-09-27 ENCOUNTER — Other Ambulatory Visit: Payer: Self-pay

## 2011-09-27 DIAGNOSIS — M25569 Pain in unspecified knee: Secondary | ICD-10-CM | POA: Diagnosis not present

## 2011-09-27 DIAGNOSIS — M25469 Effusion, unspecified knee: Secondary | ICD-10-CM | POA: Diagnosis not present

## 2011-09-27 MED ORDER — ROSUVASTATIN CALCIUM 10 MG PO TABS: 10.0000 mg | ORAL_TABLET | Freq: Every day | ORAL | Status: DC

## 2011-09-29 ENCOUNTER — Encounter (HOSPITAL_COMMUNITY): Payer: Self-pay

## 2011-10-05 NOTE — Patient Instructions (Signed)
20 Kelly [name]  10/05/2011   Your procedure is scheduled on:  10/12/11 1000am-1248pm  Report to [facility] at 0730 AM.  Call this number if you have problems the morning of surgery: [phone]   Remember:   Do not eat food:After Midnight.  May have clear liquids:until Midnight .  Marland Kitchen  Take these medicines the morning of surgery with A SIP OF WATER:   Do not wear jewelry, make-up or nail polish.  Do not wear lotions, powders, or perfumes. .  Do not shave 48 hours prior to surgery.   Do not bring valuables to the hospital.  Contacts, dentures or bridgework may not be worn into surgery.  Leave suitcase in the car. After surgery it may be brought to your room.  For patients admitted to the hospital, checkout time is 11:00 AM the day of discharge.     Special Instructions: CHG Shower Use Special Wash: 1/2 bottle night before surgery and 1/2 bottle morning of surgery. shower chin to toes with CHG.  Wash face and private parts with regular soap.    Please read over the following fact sheets that you were given: MRSA Information, coughing and deep breathing exercises, leg exercises, Blood Transfusion Fact Sheet,

## 2011-10-06 ENCOUNTER — Encounter (HOSPITAL_COMMUNITY)
Admission: RE | Admit: 2011-10-06 | Discharge: 2011-10-06 | Disposition: A | Discharge status: Home / Self Care | Payer: Medicare Other | Source: Ambulatory Visit | Attending: Orthopaedic Surgery | Admitting: Orthopaedic Surgery

## 2011-10-06 ENCOUNTER — Other Ambulatory Visit: Payer: Self-pay

## 2011-10-06 ENCOUNTER — Encounter (HOSPITAL_COMMUNITY): Payer: Self-pay

## 2011-10-06 ENCOUNTER — Ambulatory Visit (HOSPITAL_COMMUNITY)
Admission: RE | Admit: 2011-10-06 | Discharge: 2011-10-06 | Disposition: A | Discharge status: Home / Self Care | Payer: Medicare Other | Source: Ambulatory Visit | Attending: Orthopaedic Surgery | Admitting: Orthopaedic Surgery

## 2011-10-06 DIAGNOSIS — Z01818 Encounter for other preprocedural examination: Secondary | ICD-10-CM | POA: Diagnosis not present

## 2011-10-06 DIAGNOSIS — T84039A Mechanical loosening of unspecified internal prosthetic joint, initial encounter: Secondary | ICD-10-CM | POA: Insufficient documentation

## 2011-10-06 DIAGNOSIS — Y831 Surgical operation with implant of artificial internal device as the cause of abnormal reaction of the patient, or of later complication, without mention of misadventure at the time of the procedure: Secondary | ICD-10-CM | POA: Insufficient documentation

## 2011-10-06 DIAGNOSIS — I10 Essential (primary) hypertension: Secondary | ICD-10-CM | POA: Insufficient documentation

## 2011-10-06 DIAGNOSIS — Z96659 Presence of unspecified artificial knee joint: Secondary | ICD-10-CM | POA: Insufficient documentation

## 2011-10-06 DIAGNOSIS — Z01812 Encounter for preprocedural laboratory examination: Secondary | ICD-10-CM | POA: Diagnosis not present

## 2011-10-06 LAB — URINALYSIS, ROUTINE W REFLEX MICROSCOPIC
Glucose, UA: NEGATIVE mg/dL
Hgb urine dipstick: NEGATIVE
Nitrite: NEGATIVE
Protein, ur: NEGATIVE mg/dL
Specific Gravity, Urine: 1.023 (ref 1.005–1.030)
Urobilinogen, UA: 0.2 mg/dL (ref 0.0–1.0)
pH: 5 (ref 5.0–8.0)

## 2011-10-06 LAB — CBC
HCT: 35.9 % — ABNORMAL LOW (ref 36.0–46.0)
Hemoglobin: 11.9 g/dL — ABNORMAL LOW (ref 12.0–15.0)
MCH: 30 pg (ref 26.0–34.0)
MCHC: 33.1 g/dL (ref 30.0–36.0)
MCV: 90.4 fL (ref 78.0–100.0)
Platelets: 240 10*3/uL (ref 150–400)
RBC: 3.97 MIL/uL (ref 3.87–5.11)
RDW: 13.9 % (ref 11.5–15.5)
WBC: 6.7 10*3/uL (ref 4.0–10.5)

## 2011-10-06 LAB — PROTIME-INR
INR: 1 (ref 0.00–1.49)
Prothrombin Time: 13.4 seconds (ref 11.6–15.2)

## 2011-10-06 LAB — SURGICAL PCR SCREEN: Staphylococcus aureus: NEGATIVE

## 2011-10-06 LAB — BASIC METABOLIC PANEL
BUN: 29 mg/dL — ABNORMAL HIGH (ref 6–23)
Calcium: 9.9 mg/dL (ref 8.4–10.5)
Creatinine, Ser: 1.55 mg/dL — ABNORMAL HIGH (ref 0.50–1.10)
GFR calc non Af Amer: 34 mL/min — ABNORMAL LOW (ref >90)
Glucose, Bld: 90 mg/dL (ref 70–99)

## 2011-10-06 LAB — APTT: aPTT: 37 seconds (ref 24–37)

## 2011-10-06 LAB — URINE MICROSCOPIC-ADD ON

## 2011-10-06 NOTE — Progress Notes (Signed)
Faxed and confirmation received to Dr Doneen Poisson regarding abnormal lab results.

## 2011-10-12 ENCOUNTER — Encounter (HOSPITAL_COMMUNITY): Payer: Self-pay | Admitting: Anesthesiology

## 2011-10-12 ENCOUNTER — Inpatient Hospital Stay (HOSPITAL_COMMUNITY): Payer: Medicare Other

## 2011-10-12 ENCOUNTER — Encounter (HOSPITAL_COMMUNITY): Payer: Self-pay | Admitting: *Deleted

## 2011-10-12 ENCOUNTER — Inpatient Hospital Stay (HOSPITAL_COMMUNITY)
Admission: RE | Admit: 2011-10-12 | Discharge: 2011-10-17 | DRG: 467 | Disposition: A | Discharge status: Skilled Nursing Facility | Payer: Medicare Other | Source: Ambulatory Visit | Attending: Orthopaedic Surgery | Admitting: Orthopaedic Surgery

## 2011-10-12 ENCOUNTER — Other Ambulatory Visit: Payer: Self-pay

## 2011-10-12 ENCOUNTER — Encounter (HOSPITAL_COMMUNITY)
Admission: RE | Disposition: A | Discharge status: Skilled Nursing Facility | Payer: Self-pay | Source: Ambulatory Visit | Attending: Orthopaedic Surgery

## 2011-10-12 ENCOUNTER — Inpatient Hospital Stay (HOSPITAL_COMMUNITY): Payer: Medicare Other | Admitting: Anesthesiology

## 2011-10-12 DIAGNOSIS — D62 Acute posthemorrhagic anemia: Secondary | ICD-10-CM | POA: Diagnosis not present

## 2011-10-12 DIAGNOSIS — M1710 Unilateral primary osteoarthritis, unspecified knee: Secondary | ICD-10-CM | POA: Diagnosis not present

## 2011-10-12 DIAGNOSIS — Z96659 Presence of unspecified artificial knee joint: Secondary | ICD-10-CM

## 2011-10-12 DIAGNOSIS — E785 Hyperlipidemia, unspecified: Secondary | ICD-10-CM | POA: Diagnosis not present

## 2011-10-12 DIAGNOSIS — N289 Disorder of kidney and ureter, unspecified: Secondary | ICD-10-CM | POA: Diagnosis not present

## 2011-10-12 DIAGNOSIS — I129 Hypertensive chronic kidney disease with stage 1 through stage 4 chronic kidney disease, or unspecified chronic kidney disease: Secondary | ICD-10-CM | POA: Diagnosis present

## 2011-10-12 DIAGNOSIS — N181 Chronic kidney disease, stage 1: Secondary | ICD-10-CM | POA: Diagnosis present

## 2011-10-12 DIAGNOSIS — Y831 Surgical operation with implant of artificial internal device as the cause of abnormal reaction of the patient, or of later complication, without mention of misadventure at the time of the procedure: Secondary | ICD-10-CM | POA: Diagnosis present

## 2011-10-12 DIAGNOSIS — T84099A Other mechanical complication of unspecified internal joint prosthesis, initial encounter: Secondary | ICD-10-CM | POA: Diagnosis not present

## 2011-10-12 DIAGNOSIS — M25569 Pain in unspecified knee: Secondary | ICD-10-CM | POA: Diagnosis not present

## 2011-10-12 DIAGNOSIS — Z471 Aftercare following joint replacement surgery: Secondary | ICD-10-CM | POA: Diagnosis not present

## 2011-10-12 DIAGNOSIS — E119 Type 2 diabetes mellitus without complications: Secondary | ICD-10-CM | POA: Diagnosis present

## 2011-10-12 DIAGNOSIS — T84039A Mechanical loosening of unspecified internal prosthetic joint, initial encounter: Secondary | ICD-10-CM | POA: Diagnosis not present

## 2011-10-12 DIAGNOSIS — S8990XA Unspecified injury of unspecified lower leg, initial encounter: Secondary | ICD-10-CM | POA: Diagnosis not present

## 2011-10-12 DIAGNOSIS — Z5189 Encounter for other specified aftercare: Secondary | ICD-10-CM | POA: Diagnosis not present

## 2011-10-12 DIAGNOSIS — I10 Essential (primary) hypertension: Secondary | ICD-10-CM | POA: Diagnosis not present

## 2011-10-12 DIAGNOSIS — M25469 Effusion, unspecified knee: Secondary | ICD-10-CM | POA: Diagnosis not present

## 2011-10-12 DIAGNOSIS — D649 Anemia, unspecified: Secondary | ICD-10-CM | POA: Diagnosis not present

## 2011-10-12 DIAGNOSIS — M129 Arthropathy, unspecified: Secondary | ICD-10-CM | POA: Diagnosis not present

## 2011-10-12 DIAGNOSIS — T84038A Mechanical loosening of other internal prosthetic joint, initial encounter: Secondary | ICD-10-CM

## 2011-10-12 HISTORY — PX: TOTAL KNEE REVISION: SHX996

## 2011-10-12 LAB — GRAM STAIN: Gram Stain: NONE SEEN

## 2011-10-12 LAB — ABO/RH: ABO/RH(D): O POS

## 2011-10-12 SURGERY — TOTAL KNEE REVISION
Anesthesia: Spinal | Site: Knee | Laterality: Left | Wound class: Clean

## 2011-10-12 MED ORDER — BUPIVACAINE HCL (PF) 0.5 % IJ SOLN
INTRAMUSCULAR | Status: DC | PRN
  Administered 2011-10-12: 3 mL

## 2011-10-12 MED ORDER — FENTANYL CITRATE 0.05 MG/ML IJ SOLN
INTRAMUSCULAR | Status: DC | PRN
  Administered 2011-10-12: 100 ug via INTRAVENOUS

## 2011-10-12 MED ORDER — ONDANSETRON HCL 4 MG/2ML IJ SOLN
INTRAMUSCULAR | Status: DC | PRN
  Administered 2011-10-12: 4 mg via INTRAVENOUS

## 2011-10-12 MED ORDER — PROMETHAZINE HCL 25 MG/ML IJ SOLN: 6.2500 mg | INTRAMUSCULAR | Status: DC | PRN

## 2011-10-12 MED ORDER — LACTATED RINGERS IV SOLN: INTRAVENOUS | Status: DC

## 2011-10-12 MED ORDER — PHENOL 1.4 % MT LIQD
1.0000 | OROMUCOSAL | Status: DC | PRN
  Filled 2011-10-12: qty 177

## 2011-10-12 MED ORDER — MENTHOL 3 MG MT LOZG
1.0000 | LOZENGE | OROMUCOSAL | Status: DC | PRN
  Administered 2011-10-14: 3 mg via ORAL
  Filled 2011-10-12 (×2): qty 9

## 2011-10-12 MED ORDER — MIDAZOLAM HCL 5 MG/5ML IJ SOLN
INTRAMUSCULAR | Status: DC | PRN
  Administered 2011-10-12: 2 mg via INTRAVENOUS

## 2011-10-12 MED ORDER — ALUM & MAG HYDROXIDE-SIMETH 200-200-20 MG/5ML PO SUSP: 30.0000 mL | ORAL | Status: DC | PRN

## 2011-10-12 MED ORDER — LACTATED RINGERS IV SOLN
INTRAVENOUS | Status: DC
  Administered 2011-10-12: 1000 mL via INTRAVENOUS

## 2011-10-12 MED ORDER — ACETAMINOPHEN 10 MG/ML IV SOLN
INTRAVENOUS | Status: DC | PRN
  Administered 2011-10-12: 1000 mg via INTRAVENOUS

## 2011-10-12 MED ORDER — PROPOFOL INFUSION 10 MG/ML OPTIME
INTRAVENOUS | Status: DC | PRN
  Administered 2011-10-12: 140 ug/kg/min via INTRAVENOUS

## 2011-10-12 MED ORDER — MORPHINE SULFATE 2 MG/ML IJ SOLN
2.0000 mg | INTRAMUSCULAR | Status: DC | PRN
  Administered 2011-10-12 – 2011-10-17 (×2): 2 mg via INTRAVENOUS
  Filled 2011-10-12 (×2): qty 1

## 2011-10-12 MED ORDER — ACETAMINOPHEN 325 MG PO TABS
650.0000 mg | ORAL_TABLET | Freq: Four times a day (QID) | ORAL | Status: DC | PRN
  Administered 2011-10-14 – 2011-10-15 (×3): 650 mg via ORAL
  Filled 2011-10-12 (×3): qty 2

## 2011-10-12 MED ORDER — DIPHENHYDRAMINE HCL 12.5 MG/5ML PO ELIX: 12.5000 mg | ORAL_SOLUTION | Freq: Four times a day (QID) | ORAL | Status: DC | PRN

## 2011-10-12 MED ORDER — ONDANSETRON HCL 4 MG PO TABS: 4.0000 mg | ORAL_TABLET | Freq: Four times a day (QID) | ORAL | Status: DC | PRN

## 2011-10-12 MED ORDER — LACTATED RINGERS IV SOLN
INTRAVENOUS | Status: DC | PRN
  Administered 2011-10-12 (×3): via INTRAVENOUS

## 2011-10-12 MED ORDER — HYDROMORPHONE HCL PF 1 MG/ML IJ SOLN
0.2500 mg | INTRAMUSCULAR | Status: DC | PRN
  Administered 2011-10-12 (×2): 0.5 mg via INTRAVENOUS
  Administered 2011-10-12: 0.25 mg via INTRAVENOUS
  Administered 2011-10-12 (×2): 0.5 mg via INTRAVENOUS

## 2011-10-12 MED ORDER — EPHEDRINE SULFATE 50 MG/ML IJ SOLN
INTRAMUSCULAR | Status: DC | PRN
  Administered 2011-10-12 (×4): 5 mg via INTRAVENOUS

## 2011-10-12 MED ORDER — HYDROMORPHONE 0.3 MG/ML IV SOLN
INTRAVENOUS | Status: DC
  Administered 2011-10-12: 3.9 mg via INTRAVENOUS
  Administered 2011-10-12 – 2011-10-13 (×2): via INTRAVENOUS
  Administered 2011-10-13: 3.3 mg via INTRAVENOUS
  Administered 2011-10-13: 2.55 mg via INTRAVENOUS
  Administered 2011-10-13: 2.33 mg via INTRAVENOUS
  Administered 2011-10-13: 3.3 mg via INTRAVENOUS
  Administered 2011-10-14: 2.4 mg via INTRAVENOUS
  Administered 2011-10-14: 3.6 mg via INTRAVENOUS
  Administered 2011-10-14: 0.72 mg via INTRAVENOUS
  Administered 2011-10-14: 1.8 mg via INTRAVENOUS
  Administered 2011-10-14: 2.7 mg via INTRAVENOUS
  Administered 2011-10-14: 02:00:00 via INTRAVENOUS
  Filled 2011-10-12 (×4): qty 25

## 2011-10-12 MED ORDER — SODIUM CHLORIDE 0.9 % IR SOLN
Status: DC | PRN
  Administered 2011-10-12 (×2): 3000 mL
  Administered 2011-10-12: 1000 mL
  Administered 2011-10-12: 3000 mL

## 2011-10-12 MED ORDER — NALOXONE HCL 0.4 MG/ML IJ SOLN: 0.4000 mg | INTRAMUSCULAR | Status: DC | PRN

## 2011-10-12 MED ORDER — LISINOPRIL 20 MG PO TABS
20.0000 mg | ORAL_TABLET | Freq: Every morning | ORAL | Status: DC
  Administered 2011-10-13 – 2011-10-17 (×4): 20 mg via ORAL
  Filled 2011-10-12 (×5): qty 1

## 2011-10-12 MED ORDER — ADULT MULTIVITAMIN W/MINERALS CH
1.0000 | ORAL_TABLET | Freq: Every day | ORAL | Status: DC
  Administered 2011-10-12 – 2011-10-17 (×6): 1 via ORAL
  Filled 2011-10-12 (×6): qty 1

## 2011-10-12 MED ORDER — METHOCARBAMOL 500 MG PO TABS
500.0000 mg | ORAL_TABLET | Freq: Four times a day (QID) | ORAL | Status: DC | PRN
  Administered 2011-10-13 – 2011-10-17 (×14): 500 mg via ORAL
  Filled 2011-10-12 (×15): qty 1

## 2011-10-12 MED ORDER — SODIUM CHLORIDE 0.9 % IJ SOLN: 9.0000 mL | INTRAMUSCULAR | Status: DC | PRN

## 2011-10-12 MED ORDER — DOCUSATE SODIUM 100 MG PO CAPS
100.0000 mg | ORAL_CAPSULE | Freq: Two times a day (BID) | ORAL | Status: DC
  Administered 2011-10-12 – 2011-10-17 (×10): 100 mg via ORAL
  Filled 2011-10-12 (×7): qty 1

## 2011-10-12 MED ORDER — ONDANSETRON HCL 4 MG/2ML IJ SOLN
4.0000 mg | Freq: Four times a day (QID) | INTRAMUSCULAR | Status: DC | PRN
  Administered 2011-10-13 – 2011-10-14 (×3): 4 mg via INTRAVENOUS
  Filled 2011-10-12: qty 2

## 2011-10-12 MED ORDER — ZOLPIDEM TARTRATE 5 MG PO TABS
5.0000 mg | ORAL_TABLET | Freq: Every evening | ORAL | Status: DC | PRN
  Administered 2011-10-16: 5 mg via ORAL
  Filled 2011-10-12: qty 1

## 2011-10-12 MED ORDER — LISINOPRIL 20 MG PO TABS: 20.0000 mg | ORAL_TABLET | Freq: Every morning | ORAL | Status: DC

## 2011-10-12 MED ORDER — FENTANYL CITRATE 0.05 MG/ML IJ SOLN
25.0000 ug | INTRAMUSCULAR | Status: DC | PRN
  Administered 2011-10-12 (×2): 25 ug via INTRAVENOUS

## 2011-10-12 MED ORDER — METHOCARBAMOL 100 MG/ML IJ SOLN
500.0000 mg | Freq: Four times a day (QID) | INTRAVENOUS | Status: DC | PRN
  Administered 2011-10-12: 500 mg via INTRAVENOUS
  Filled 2011-10-12: qty 5

## 2011-10-12 MED ORDER — ASPIRIN EC 325 MG PO TBEC
325.0000 mg | DELAYED_RELEASE_TABLET | Freq: Two times a day (BID) | ORAL | Status: DC
  Administered 2011-10-13 – 2011-10-17 (×9): 325 mg via ORAL
  Filled 2011-10-12 (×13): qty 1

## 2011-10-12 MED ORDER — 0.9 % SODIUM CHLORIDE (POUR BTL) OPTIME
TOPICAL | Status: DC | PRN
  Administered 2011-10-12: 1000 mL

## 2011-10-12 MED ORDER — SODIUM CHLORIDE 0.9 % IV SOLN
INTRAVENOUS | Status: DC
  Administered 2011-10-12 – 2011-10-13 (×2): via INTRAVENOUS

## 2011-10-12 MED ORDER — ONDANSETRON HCL 4 MG/2ML IJ SOLN
4.0000 mg | Freq: Four times a day (QID) | INTRAMUSCULAR | Status: DC | PRN
  Filled 2011-10-12 (×2): qty 2

## 2011-10-12 MED ORDER — DIPHENHYDRAMINE HCL 50 MG/ML IJ SOLN: 12.5000 mg | Freq: Four times a day (QID) | INTRAMUSCULAR | Status: DC | PRN

## 2011-10-12 MED ORDER — MIDAZOLAM HCL 2 MG/2ML IJ SOLN
0.5000 mg | Freq: Once | INTRAMUSCULAR | Status: AC | PRN
Start: 2011-10-12 — End: 2011-10-12
  Administered 2011-10-12 (×2): 1 mg via INTRAVENOUS

## 2011-10-12 MED ORDER — OXYCODONE HCL 5 MG PO TABS
5.0000 mg | ORAL_TABLET | ORAL | Status: DC | PRN
  Administered 2011-10-12: 5 mg via ORAL
  Administered 2011-10-13 – 2011-10-16 (×15): 10 mg via ORAL
  Administered 2011-10-16: 5 mg via ORAL
  Administered 2011-10-16 – 2011-10-17 (×5): 10 mg via ORAL
  Filled 2011-10-12 (×21): qty 2
  Filled 2011-10-12: qty 1

## 2011-10-12 MED ORDER — MEPERIDINE HCL 50 MG/ML IJ SOLN: 6.2500 mg | INTRAMUSCULAR | Status: DC | PRN

## 2011-10-12 MED ORDER — VANCOMYCIN HCL IN DEXTROSE 1-5 GM/200ML-% IV SOLN
1000.0000 mg | Freq: Two times a day (BID) | INTRAVENOUS | Status: AC
  Administered 2011-10-12: 1000 mg via INTRAVENOUS
  Filled 2011-10-12: qty 200

## 2011-10-12 MED ORDER — ATORVASTATIN CALCIUM 20 MG PO TABS
20.0000 mg | ORAL_TABLET | Freq: Every day | ORAL | Status: DC
  Administered 2011-10-12 – 2011-10-16 (×5): 20 mg via ORAL
  Filled 2011-10-12 (×6): qty 1

## 2011-10-12 MED ORDER — METOCLOPRAMIDE HCL 5 MG/ML IJ SOLN
5.0000 mg | Freq: Three times a day (TID) | INTRAMUSCULAR | Status: DC | PRN
  Administered 2011-10-13: 5 mg via INTRAVENOUS
  Filled 2011-10-12: qty 2

## 2011-10-12 MED ORDER — ACETAMINOPHEN 650 MG RE SUPP: 650.0000 mg | Freq: Four times a day (QID) | RECTAL | Status: DC | PRN

## 2011-10-12 MED ORDER — METOCLOPRAMIDE HCL 10 MG PO TABS: 5.0000 mg | ORAL_TABLET | Freq: Three times a day (TID) | ORAL | Status: DC | PRN

## 2011-10-12 MED ORDER — VANCOMYCIN HCL IN DEXTROSE 1-5 GM/200ML-% IV SOLN
1000.0000 mg | INTRAVENOUS | Status: AC
  Administered 2011-10-12: 1000 mg via INTRAVENOUS

## 2011-10-12 MED ORDER — DIPHENHYDRAMINE HCL 12.5 MG/5ML PO ELIX: 12.5000 mg | ORAL_SOLUTION | ORAL | Status: DC | PRN

## 2011-10-12 SURGICAL SUPPLY — 73 items
ADAPTER BOLT FEMORAL +2/-2 (Knees) ×1 IMPLANT
ADPR FEM +2/-2 OFST BOLT (Knees) ×1 IMPLANT
ADPR FEM 5D STRL KN PFC SGM (Orthopedic Implant) ×1 IMPLANT
BAG SPEC THK2 15X12 ZIP CLS (MISCELLANEOUS) ×1
BAG ZIPLOCK 12X15 (MISCELLANEOUS) ×2 IMPLANT
BANDAGE ELASTIC 4 VELCRO ST LF (GAUZE/BANDAGES/DRESSINGS) ×2 IMPLANT
BANDAGE ELASTIC 6 VELCRO ST LF (GAUZE/BANDAGES/DRESSINGS) ×2 IMPLANT
BANDAGE ESMARK 6X9 LF (GAUZE/BANDAGES/DRESSINGS) ×1 IMPLANT
BLADE SAG 18X100X1.27 (BLADE) ×2 IMPLANT
BLADE SAW SGTL 13.0X1.19X90.0M (BLADE) ×2 IMPLANT
BNDG CMPR 9X6 STRL LF SNTH (GAUZE/BANDAGES/DRESSINGS) ×1
BNDG ESMARK 6X9 LF (GAUZE/BANDAGES/DRESSINGS) ×2
BONE CEMENT GENTAMICIN (Cement) ×6 IMPLANT
CEMENT BONE GENTAMICIN 40 (Cement) IMPLANT
CEMENT RESTRICTOR DEPUY SZ 4 (Cement) ×1 IMPLANT
CLOTH BEACON ORANGE TIMEOUT ST (SAFETY) ×2 IMPLANT
CONT SPECI 4OZ STER CLIK (MISCELLANEOUS) ×1 IMPLANT
CUFF TOURN SGL QUICK 34 (TOURNIQUET CUFF) ×2
CUFF TRNQT CYL 34X4X40X1 (TOURNIQUET CUFF) ×1 IMPLANT
DRAPE EXTREMITY T 121X128X90 (DRAPE) ×2 IMPLANT
DRAPE POUCH INSTRU U-SHP 10X18 (DRAPES) ×2 IMPLANT
DRAPE U-SHAPE 47X51 STRL (DRAPES) ×2 IMPLANT
DRSG PAD ABDOMINAL 8X10 ST (GAUZE/BANDAGES/DRESSINGS) ×3 IMPLANT
DURAPREP 26ML APPLICATOR (WOUND CARE) ×2 IMPLANT
ELECT REM PT RETURN 9FT ADLT (ELECTROSURGICAL) ×2
ELECTRODE REM PT RTRN 9FT ADLT (ELECTROSURGICAL) ×1 IMPLANT
EVACUATOR 1/8 PVC DRAIN (DRAIN) ×2 IMPLANT
FACESHIELD LNG OPTICON STERILE (SAFETY) ×10 IMPLANT
FEMORAL ADAPTER (Orthopedic Implant) ×1 IMPLANT
FEMORAL PFC TC3 (Orthopedic Implant) ×1 IMPLANT
GAUZE XEROFORM 5X9 LF (GAUZE/BANDAGES/DRESSINGS) ×2 IMPLANT
GLOVE BIO SURGEON STRL SZ7.5 (GLOVE) ×3 IMPLANT
GLOVE BIOGEL PI IND STRL 7.5 (GLOVE) ×1 IMPLANT
GLOVE BIOGEL PI IND STRL 8 (GLOVE) ×1 IMPLANT
GLOVE BIOGEL PI INDICATOR 7.5 (GLOVE) ×1
GLOVE BIOGEL PI INDICATOR 8 (GLOVE) ×1
GLOVE ECLIPSE 7.0 STRL STRAW (GLOVE) ×2 IMPLANT
GLOVE SURG SS PI 6.5 STRL IVOR (GLOVE) ×2 IMPLANT
GLOVE SURG SS PI 7.5 STRL IVOR (GLOVE) ×2 IMPLANT
GOWN STRL REIN XL XLG (GOWN DISPOSABLE) ×4 IMPLANT
HANDPIECE INTERPULSE COAX TIP (DISPOSABLE) ×2
IMMOBILIZER KNEE 20 (SOFTGOODS) ×2
IMMOBILIZER KNEE 20 THIGH 36 (SOFTGOODS) ×1 IMPLANT
INSERT TC3 TIBIAL SZ2.5 10.0 (Knees) ×1 IMPLANT
KIT BASIN OR (CUSTOM PROCEDURE TRAY) ×2 IMPLANT
NS IRRIG 1000ML POUR BTL (IV SOLUTION) ×2 IMPLANT
PACK TOTAL JOINT (CUSTOM PROCEDURE TRAY) ×2 IMPLANT
PADDING CAST COTTON 6X4 STRL (CAST SUPPLIES) ×3 IMPLANT
POSITIONER SURGICAL ARM (MISCELLANEOUS) ×2 IMPLANT
SET HNDPC FAN SPRY TIP SCT (DISPOSABLE) ×1 IMPLANT
SET PAD KNEE POSITIONER (MISCELLANEOUS) ×2 IMPLANT
SLEEVE UNIV FEM DIST PRO SZ 31 (Sleeve) ×1 IMPLANT
SPONGE GAUZE 4X4 12PLY (GAUZE/BANDAGES/DRESSINGS) ×1 IMPLANT
SPONGE LAP 18X18 X RAY DECT (DISPOSABLE) ×2 IMPLANT
STAPLER VISISTAT 35W (STAPLE) ×2 IMPLANT
STEM TIBIA PFC 13X30MM (Stem) ×1 IMPLANT
STEM UNIVERSAL REVISION 75X14 (Stem) ×1 IMPLANT
SUCTION FRAZIER 12FR DISP (SUCTIONS) ×2 IMPLANT
SUT VIC AB 0 CT1 27 (SUTURE)
SUT VIC AB 0 CT1 27XBRD ANTBC (SUTURE) ×3 IMPLANT
SUT VIC AB 1 CT1 27 (SUTURE) ×6
SUT VIC AB 1 CT1 27XBRD ANTBC (SUTURE) ×3 IMPLANT
SUT VIC AB 2-0 CT1 27 (SUTURE) ×8
SUT VIC AB 2-0 CT1 TAPERPNT 27 (SUTURE) ×3 IMPLANT
SUT VICRYL 4-0 (SUTURE) ×1 IMPLANT
SUT VLOC 180 0 24IN GS25 (SUTURE) ×1 IMPLANT
SWAB COLLECTION DEVICE MRSA (MISCELLANEOUS) ×2 IMPLANT
TOWEL OR 17X26 10 PK STRL BLUE (TOWEL DISPOSABLE) ×6 IMPLANT
TRAY FOLEY CATH 14FRSI W/METER (CATHETERS) ×2 IMPLANT
TRAY REVISION SZ 2.5 (Knees) ×1 IMPLANT
TRAY SLEEVE CEM ML (Knees) ×1 IMPLANT
TUBE ANAEROBIC SPECIMEN COL (MISCELLANEOUS) ×2 IMPLANT
WATER STERILE IRR 1500ML POUR (IV SOLUTION) ×3 IMPLANT

## 2011-10-12 NOTE — Transfer of Care (Signed)
Immediate Anesthesia Transfer of Care Note  Patient: Kelly [name]  Procedure(s) Performed: Procedure(s) (LRB): TOTAL KNEE REVISION (Left)  Patient Location: PACU  Anesthesia Type: Regional  Level of Consciousness: awake, alert  and patient cooperative  Airway & Oxygen Therapy: Patient Spontanous Breathing and Patient connected to face mask oxygen  Post-op Assessment: Report given to PACU RN and Post -op Vital signs reviewed and stable  Post vital signs: Reviewed and stable  Complications: No apparent anesthesia complications

## 2011-10-12 NOTE — Brief Op Note (Signed)
10/12/2011  1:14 PM  PATIENT:  Kelly [name]  66 y.o. female  PRE-OPERATIVE DIAGNOSIS:  Failed, loose total knee replacement  POST-OPERATIVE DIAGNOSIS:  Failed, loose total knee replacement  PROCEDURE:  Procedure(s) (LRB): TOTAL KNEE REVISION (Left)  SURGEON:  Surgeon(s) and Role:    * Kathryne Hitch, MD - Primary  PHYSICIAN ASSISTANT:   ASSISTANTS: OR staff   ANESTHESIA:   spinal  EBL:  Total I/O In: 2400 [I.V.:2400] Out: 600 [Urine:400; Blood:200]  BLOOD ADMINISTERED:none  DRAINS: (medium) Hemovact drain(s) in the knee joint with  Suction Open   LOCAL MEDICATIONS USED:  NONE  SPECIMEN:  No Specimen  DISPOSITION OF SPECIMEN:  N/A  COUNTS:  YES  TOURNIQUET:   Total Tourniquet Time Documented: Thigh (Left) - 122 minutes  DICTATION: .Other Dictation: Dictation Number [id number]  PLAN OF CARE: Admit to inpatient   PATIENT DISPOSITION:  PACU - hemodynamically stable.   Delay start of Pharmacological VTE agent (>24hrs) due to surgical blood loss or risk of bleeding: no

## 2011-10-12 NOTE — Anesthesia Preprocedure Evaluation (Addendum)
Anesthesia Evaluation  Patient identified by MRN, date of birth, ID band Patient awake    Reviewed: Allergy & Precautions, H&P , NPO status , Patient's Chart, lab work & pertinent test results  Airway Mallampati: II TM Distance: >3 FB Neck ROM: Full    Dental No notable dental hx.    Pulmonary neg pulmonary ROS,  breath sounds clear to auscultation  Pulmonary exam normal       Cardiovascular hypertension, Pt. on medications Rhythm:Regular Rate:Normal     Neuro/Psych negative neurological ROS  negative psych ROS   GI/Hepatic negative GI ROS, Neg liver ROS,   Endo/Other  negative endocrine ROS  Renal/GU negative Renal ROS  negative genitourinary   Musculoskeletal negative musculoskeletal ROS (+)   Abdominal   Peds negative pediatric ROS (+)  Hematology negative hematology ROS (+)   Anesthesia Other Findings   Reproductive/Obstetrics negative OB ROS                          Anesthesia Physical Anesthesia Plan  ASA: II  Anesthesia Plan: Spinal   Post-op Pain Management:    Induction:   Airway Management Planned:   Additional Equipment:   Intra-op Plan:   Post-operative Plan:   Informed Consent: I have reviewed the patients History and Physical, chart, labs and discussed the procedure including the risks, benefits and alternatives for the proposed anesthesia with the patient or authorized representative who has indicated his/her understanding and acceptance.   Dental advisory given  Plan Discussed with: CRNA  Anesthesia Plan Comments:         Anesthesia Quick Evaluation

## 2011-10-12 NOTE — Anesthesia Postprocedure Evaluation (Signed)
  Anesthesia Post-op Note  Patient: Kelly [name]  Procedure(s) Performed: Procedure(s) (LRB): TOTAL KNEE REVISION (Left)  Patient Location: PACU  Anesthesia Type: General  Level of Consciousness: awake and alert   Airway and Oxygen Therapy: Patient Spontanous Breathing  Post-op Pain: mild  Post-op Assessment: Post-op Vital signs reviewed, Patient's Cardiovascular Status Stable, Respiratory Function Stable, Patent Airway and No signs of Nausea or vomiting  Post-op Vital Signs: stable  Complications: No apparent anesthesia complications

## 2011-10-12 NOTE — H&P (Signed)
Kelly [name] is an 66 y.o. female.   Chief Complaint:   Left knee pain with recurrent effusions HPI:   66 yo female a few years out from a left total knee replacement done in [facility].  She has several synovectomies and lysis of adhesions as well as scopes of that knee in the year following.  She now has a recurrent effusion and x-rays worrisome for prosthetic loosening vs infection.  She understands the need for an excision of the implants followed by revision pending our intraoperative findings and if infection is present.  She understands fully the risks of blood loss, nerve injury, fracture and the need for a two stage operation.  Past Medical History  Diagnosis Date  . Hyperlipidemia   . Hypertension   . Arthritis   . Anemia   . Diabetes mellitus     not on meds last Aic in [facility] 04/20/11   . Renal insufficiency, mild     CKD- stage I per office visit note of PCP     Past Surgical History  Procedure Date  . Abdominal hysterectomy   . Cholecystectomy   . Facial cosmetic surgery 2/10  . Knee arthroscopy 2007    left & right  . Liposuction extremities     thighs  . Breast reduction surgery     and lift  . Carpal tunnel release 05/11    right  . Trigger finger release 5/11    right 4th finger    Family History  Problem Relation Age of Onset  . Heart disease Mother   . Alcohol abuse Father   . Hypertension Father   . Cancer Sister   . Hypertension Sister    Social History:  reports that she has never smoked. She has never used smokeless tobacco. She reports that she drinks about 8.4 ounces of alcohol per week. She reports that she does not use illicit drugs.  Allergies:  Allergies  Allergen Reactions  . Penicillins     Medications Prior to Admission  Medication Sig Dispense Refill  . aspirin 81 MG tablet Take 81 mg by mouth every morning.       . calcium carbonate (OS-CAL) 600 MG TABS Take 600 mg by mouth every morning.       . Cholecalciferol (VITAMIN D-3 PO)  Take 1 % by mouth daily.       . Cholecalciferol (VITAMIN D3) 1000 UNITS CAPS Take 2,000 Units by mouth every morning.       . Glucosamine-Chondroit-Vit C-Mn (GLUCOSAMINE 1500 COMPLEX PO) Take 1 tablet by mouth every morning.      Marland Kitchen HYDROcodone-acetaminophen (NORCO) 7.5-325 MG per tablet Take 1 tablet by mouth every 6 (six) hours as needed. pain      . ibuprofen (ADVIL,MOTRIN) 800 MG tablet Take 800 mg by mouth every 8 (eight) hours as needed. pain      . lisinopril (PRINIVIL,ZESTRIL) 20 MG tablet TAKE 1 TABLET BY MOUTH EVERY DAY  90 tablet  0  . Multiple Vitamin (MULTIVITAMIN) tablet Take 1 tablet by mouth daily.       . rosuvastatin (CRESTOR) 10 MG tablet Take 1 tablet (10 mg total) by mouth daily.  30 tablet  3  . vitamin B-12 (CYANOCOBALAMIN) 1000 MCG tablet Take 1,000 mcg by mouth 2 (two) times daily.       Marland Kitchen lisinopril (PRINIVIL,ZESTRIL) 20 MG tablet Take 20 mg by mouth every morning.      . rosuvastatin (CRESTOR) 10 MG tablet Take  10 mg by mouth daily.        Results for orders placed during the hospital encounter of 10/12/11 (from the past 48 hour(s))  ABO/RH     Status: Normal   Collection Time   10/12/11  8:00 AM      Component Value Range Comment   ABO/RH(D) O POS     TYPE AND SCREEN     Status: Normal   Collection Time   10/12/11  8:05 AM      Component Value Range Comment   ABO/RH(D) O POS      Antibody Screen NEG      Sample Expiration 10/15/2011      No results found.  Review of Systems  All other systems reviewed and are negative.    There were no vitals taken for this visit. Physical Exam  Constitutional: She is oriented to person, place, and time. She appears well-developed and well-nourished.  HENT:  Head: Normocephalic and atraumatic.  Eyes: EOM are normal. Pupils are equal, round, and reactive to light.  Neck: Normal range of motion. Neck supple.  Cardiovascular: Normal rate and regular rhythm.   Respiratory: Effort normal and breath sounds normal.  GI:  Soft. Bowel sounds are normal.  Musculoskeletal:       Left knee: She exhibits effusion. tenderness found.  Neurological: She is alert and oriented to person, place, and time.  Skin: Skin is warm and dry.  Psychiatric: She has a normal mood and affect.     Assessment/Plan: Questionable loose vs infected left total knee replacement 1) to the OR for removal of left total knee replacement (excision arthroplasty) with possible revision pending findings   Cherokee Clowers Y 10/12/2011, 10:00 AM

## 2011-10-12 NOTE — Anesthesia Procedure Notes (Signed)
Spinal Patient location during procedure: OR Staffing Anesthesiologist: Cyrus Ramsburg Performed by: anesthesiologist  Preanesthetic Checklist Completed: patient identified, site marked, surgical consent, pre-op evaluation, timeout performed, IV checked, risks and benefits discussed and monitors and equipment checked Spinal Block Patient position: sitting Prep: Betadine Patient monitoring: heart rate, continuous pulse ox and blood pressure Approach: right paramedian Location: L4-5 Injection technique: single-shot Needle Needle type: Spinocan  Needle gauge: 22 G Needle length: 9 cm Additional Notes Expiration date of kit checked and confirmed. Patient tolerated procedure well, without complications.     

## 2011-10-13 ENCOUNTER — Encounter (HOSPITAL_COMMUNITY): Payer: Self-pay | Admitting: Orthopaedic Surgery

## 2011-10-13 LAB — BASIC METABOLIC PANEL
BUN: 19 mg/dL (ref 6–23)
CO2: 24 mEq/L (ref 19–32)
Calcium: 8.7 mg/dL (ref 8.4–10.5)
Chloride: 99 mEq/L (ref 96–112)
Creatinine, Ser: 1.21 mg/dL — ABNORMAL HIGH (ref 0.50–1.10)
GFR calc Af Amer: 52 mL/min — ABNORMAL LOW (ref >90)

## 2011-10-13 LAB — CBC
HCT: 26.7 % — ABNORMAL LOW (ref 36.0–46.0)
MCH: 29.9 pg (ref 26.0–34.0)
MCV: 88.7 fL (ref 78.0–100.0)
Platelets: 179 10*3/uL (ref 150–400)
RDW: 13.9 % (ref 11.5–15.5)
WBC: 6 10*3/uL (ref 4.0–10.5)

## 2011-10-13 NOTE — Progress Notes (Signed)
Patient ID: Kelly [name], female   DOB: [birth year], 66 y.o.   MRN: [id number] Although Kelly [name]' sodium is down, this is dilutional and not hyponatremia.  She does not show signs or symptoms of hyponatremia.

## 2011-10-13 NOTE — Clinical Documentation Improvement (Signed)
Abnormal Labs Clarification  THIS DOCUMENT IS NOT A PERMANENT PART OF THE MEDICAL RECORD  TO RESPOND TO THE THIS QUERY, FOLLOW THE INSTRUCTIONS BELOW:  1. If needed, update documentation for the patient's encounter via the notes activity.  2. Access this query again and click edit on the [employer].  3. After updating, or not, click F2 to complete all highlighted (required) fields concerning your review. Select "additional documentation in the medical record" OR "no additional documentation provided".  4. Click Sign note button.  5. The deficiency will fall out of your InBasket *Please let us know if you are not able to complete this workflow by phone or e-mail (listed below).  Please update your documentation within the medical record to reflect your response to this query.                                                                                   10/13/11  Dear Dr. Thayer [facility]  In a better effort to capture your patient's severity of illness, reflect appropriate length of stay and utilization of resources, a review of the medical record has revealed the following indicators.    Based on your clinical judgment, please clarify and document in a progress note and/or discharge summary the clinical condition associated with the following supporting information:  In responding to this query please exercise your independent judgment.  The fact that a query is asked, does not imply that any particular answer is desired or expected.  Abnormal findings (laboratory, x-ray, pathologic, and other diagnostic results) are not coded and reported unless the physician indicates their clinical significance.   The medical record reflects the following clinical findings, please clarify the diagnostic and/or clinical significance:       Noted NA level 132 p/op (8/30)  and 141 on (8/23),  135 on (01/2010),  patient continues on NS @ 75 ml/ hr.  Please document secondary diagnosis  if appropriate.  Thank you   Possible Clinical Conditions?                                   Expected post op Hyponatremia  Mild post op  Hyponatremia  Hyposomolality  Abnormal lab finding  Other Condition               Cannot Clinically Determine     Evaluated: BMET  Treatment: NS @ 75 ml /hr   Reviewed: additional documentation in the medical record   Thank You,  Leonette Most Addison  Clinical Documentation Specialist RN, BSN:  Pager [phone] HIM off # [phone] Health Information Management [place]

## 2011-10-13 NOTE — Progress Notes (Signed)
Clinical Social Work Department BRIEF PSYCHOSOCIAL ASSESSMENT 10/13/2011  Patient:  JANNA, OAK     Account Number:  [id number]     Admit date:  10/12/2011  Clinical Social Worker:  Candie Chroman  Date/Time:  10/13/2011 03:45 PM  Referred by:  Physician  Date Referred:  10/13/2011 Referred for  SNF Placement   Other Referral:   Interview type:  Patient Other interview type:    PSYCHOSOCIAL DATA Living Status:  ALONE Admitted from facility:   Level of care:   Primary support name:   Primary support relationship to patient:  SIBLING Degree of support available:   unclear    CURRENT CONCERNS Current Concerns  Post-Acute Placement   Other Concerns:    SOCIAL WORK ASSESSMENT / PLAN Pt is a 66 yr old female living at home prior to hospitalization. CSW met with pt to assist with d/c planning. ST SNF placement is needed following hospital d/c. Pt is in agreement with plan. SNF search has been initiated and bed offers provided. CSW will continue to follow to assist with d/c planning needs.   Assessment/plan status:  Psychosocial Support/Ongoing Assessment of Needs Other assessment/ plan:   Information/referral to community resources:   SNF list provided.    PATIENT'S/FAMILY'S RESPONSE TO PLAN OF CARE: Pt feels ST rehab will be helpful prior to returning home.    Cori Razor LCSW [phone]

## 2011-10-13 NOTE — Progress Notes (Signed)
Subjective: 1 Day Post-Op Procedure(s) (LRB): TOTAL KNEE REVISION (Left) Patient reports pain as moderate.  Acute blood loss anemia from surgery.  Objective: Vital signs in last 24 hours: Temp:  [97.1 F (36.2 C)-98.7 F (37.1 C)] 98.4 F (36.9 C) (08/30 0504) Pulse Rate:  [57-88] 80  (08/30 0504) Resp:  [5-20] 13  (08/30 0504) BP: (97-142)/(53-101) 117/77 mmHg (08/30 0504) SpO2:  [99 %-100 %] 100 % (08/30 0504) Weight:  [83.915 kg (185 lb)] 83.915 kg (185 lb) (08/29 1833)  Intake/Output from previous day: 08/29 0701 - 08/30 0700 In: 3943.8 [P.O.:600; I.V.:3343.8] Out: 2725 [Urine:1925; Drains:600; Blood:200] Intake/Output this shift: Total I/O In: 1466.3 [P.O.:600; I.V.:866.3] Out: 1305 [Urine:1050; Drains:255]   Basename 10/13/11 0423  HGB 9.0*    Basename 10/13/11 0423  WBC 6.0  RBC 3.01*  HCT 26.7*  PLT 179    Basename 10/13/11 0423  NA 132*  K 4.1  CL 99  CO2 24  BUN 19  CREATININE 1.21*  GLUCOSE 119*  CALCIUM 8.7   No results found for this basename: LABPT:2,INR:2 in the last 72 hours  Sensation intact distally Intact pulses distally Dorsiflexion/Plantar flexion intact Incision: dressing C/D/I Compartment soft  Assessment/Plan: 1 Day Post-Op Procedure(s) (LRB): TOTAL KNEE REVISION (Left) Up with therapy, WBAT, ADL's D/C hemovac Continue PCA  Trenden Hazelrigg Y 10/13/2011, 6:51 AM

## 2011-10-13 NOTE — Progress Notes (Signed)
Physical Therapy Treatment Patient Details Name: Kelly [name] MRN: [id number] DOB: Oct 02, 1945 Today's Date: 10/13/2011 Time: 7829-5621 PT Time Calculation (min): 37 min  PT Assessment / Plan / Recommendation Comments on Treatment Session  Pt c/o dizziness during ambulation.  BP 104/69, HR 91    Follow Up Recommendations  Skilled nursing facility    Barriers to Discharge        Equipment Recommendations  Defer to next venue    Recommendations for Other Services OT consult  Frequency 7X/week   Plan Discharge plan remains appropriate    Precautions / Restrictions Precautions Precautions: Knee Required Braces or Orthoses: Knee Immobilizer - Left Knee Immobilizer - Left: Discontinue once straight leg raise with < 10 degree lag Restrictions Weight Bearing Restrictions: No Other Position/Activity Restrictions: WBAT   Pertinent Vitals/Pain     Mobility  Bed Mobility Bed Mobility: Sit to Supine Sit to Supine: 4: Min assist;3: Mod assist Details for Bed Mobility Assistance: cues for sequence and use of R LE to self assist Transfers Transfers: Stand to Sit;Sit to Stand Sit to Stand: 4: Min assist;3: Mod assist Stand to Sit: 4: Min assist;3: Mod assist Details for Transfer Assistance: cues for use of UEs and for LE management Ambulation/Gait Ambulation/Gait Assistance: 1: +2 Total assist Ambulation/Gait: Patient Percentage: 80% Ambulation Distance (Feet): 35 Feet (and 5) Assistive device: Rolling walker Ambulation/Gait Assistance Details: cues for sequence, posture, position from RW and stride length Gait Pattern: Step-to pattern Gait velocity: sloooow    Exercises     PT Diagnosis:    PT Problem List:   PT Treatment Interventions:     PT Goals Acute Rehab PT Goals PT Goal Formulation: With patient Time For Goal Achievement: 10/19/11 Potential to Achieve Goals: Good Pt will go Supine/Side to Sit: with supervision PT Goal: Supine/Side to Sit - Progress: Goal  set today Pt will go Sit to Supine/Side: with supervision PT Goal: Sit to Supine/Side - Progress: Goal set today Pt will go Sit to Stand: with supervision PT Goal: Sit to Stand - Progress: Progressing toward goal Pt will go Stand to Sit: with supervision PT Goal: Stand to Sit - Progress: Progressing toward goal Pt will Ambulate: 51 - 150 feet;with supervision;with rolling walker PT Goal: Ambulate - Progress: Progressing toward goal  Visit Information  Last PT Received On: 10/13/11 Assistance Needed: +2    Subjective Data  Subjective: I'm doing better.  I want to try Patient Stated Goal: Go to rehab and then home alone   Cognition  Overall Cognitive Status: Appears within functional limits for tasks assessed/performed Arousal/Alertness: Awake/alert Orientation Level: Appears intact for tasks assessed Behavior During Session: [facility] for tasks performed    Balance     End of Session PT - End of Session Equipment Utilized During Treatment: Left knee immobilizer Activity Tolerance: Patient tolerated treatment well Patient left: in bed;with call bell/phone within reach Nurse Communication: Mobility status   GP     Kainen Struckman 10/13/2011, 5:18 PM

## 2011-10-13 NOTE — Evaluation (Signed)
Physical Therapy Evaluation Patient Details Name: Kelly [name] MRN: [id number] DOB: 1946-02-07 Today's Date: 10/13/2011 Time:  -     PT Assessment / Plan / Recommendation Clinical Impression       PT Assessment  Patient needs continued PT services    Follow Up Recommendations  Skilled nursing facility    Barriers to Discharge        Equipment Recommendations  Defer to next venue    Recommendations for Other Services OT consult   Frequency 7X/week    Precautions / Restrictions Precautions Precautions: Knee Required Braces or Orthoses: Knee Immobilizer - Left Knee Immobilizer - Left: Discontinue once straight leg raise with < 10 degree lag Restrictions Weight Bearing Restrictions: No Other Position/Activity Restrictions: WBAT   Pertinent Vitals/Pain       Mobility  Bed Mobility Bed Mobility: Supine to Sit Supine to Sit: 3: Mod assist Details for Bed Mobility Assistance: cues for sequence and use of R LE to self assist Transfers Transfers: Stand to Sit;Sit to Stand Sit to Stand: 3: Mod assist Stand to Sit: 3: Mod assist Details for Transfer Assistance: cues for use of UEs and for LE management Ambulation/Gait Ambulation/Gait Assistance: 1: +2 Total assist Ambulation/Gait: Patient Percentage: 70% Assistive device: Rolling walker Gait Pattern: Step-to pattern    Exercises Total Joint Exercises Ankle Circles/Pumps: AROM;10 reps;Supine;Both Quad Sets: AROM;Both;10 reps;Supine Heel Slides: AAROM;10 reps;Supine;Left Straight Leg Raises: AAROM;10 reps;Left;Supine   PT Diagnosis: Difficulty walking  PT Problem List: Decreased strength;Decreased range of motion;Decreased activity tolerance;Decreased knowledge of use of DME;Pain;Decreased mobility PT Treatment Interventions: DME instruction;Gait training;Stair training;Functional mobility training;Therapeutic activities;Therapeutic exercise;Patient/family education   PT Goals Acute Rehab PT Goals PT Goal  Formulation: With patient Time For Goal Achievement: 10/19/11 Potential to Achieve Goals: Good Pt will go Supine/Side to Sit: with supervision Pt will go Sit to Supine/Side: with supervision Pt will go Sit to Stand: with supervision Pt will go Stand to Sit: with supervision Pt will Ambulate: 51 - 150 feet;with supervision;with rolling walker  Visit Information  Last PT Received On: 10/13/11 Assistance Needed: +2    Subjective Data  Subjective: I had a rough night Patient Stated Goal: Go to rehab and then home alone   Prior Functioning  Home Living Lives With: Alone Communication Communication: No difficulties    Cognition  Overall Cognitive Status: Appears within functional limits for tasks assessed/performed Arousal/Alertness: Awake/alert Orientation Level: Appears intact for tasks assessed Behavior During Session: [facility] for tasks performed    Extremity/Trunk Assessment Right Upper Extremity Assessment RUE ROM/Strength/Tone: [facility] for tasks assessed Left Upper Extremity Assessment LUE ROM/Strength/Tone: WFL for tasks assessed Right Lower Extremity Assessment RLE ROM/Strength/Tone: [facility] for tasks assessed Left Lower Extremity Assessment LLE ROM/Strength/Tone: Deficits LLE ROM/Strength/Tone Deficits: 2-/5 quads, aarom at knee -10 - 30 pain limited   Balance    End of Session PT - End of Session Equipment Utilized During Treatment: Left knee immobilizer Activity Tolerance: Patient tolerated treatment well Patient left: in chair;with call bell/phone within reach Nurse Communication: Mobility status  GP     Kelly [name] 10/13/2011, 12:48 PM

## 2011-10-13 NOTE — Op Note (Signed)
[name], [name]             ACCOUNT NO.:  [id number]  MEDICAL RECORD NO.:  [id number]  LOCATION:  1614                         FACILITY:  [facility]  PHYSICIAN:  Vanita Panda. Magnus Ivan, M.D.DATE OF BIRTH:  02-13-1946  DATE OF PROCEDURE:  10/12/2011 DATE OF DISCHARGE:                              OPERATIVE REPORT   PREOPERATIVE DIAGNOSIS:  Failed/loose left total knee arthroplasty.  POSTOPERATIVE DIAGNOSIS:  Failed/loose left total knee arthroplasty.  PROCEDURE:  Revision total knee arthroplasty, left knee.  IMPLANTS:  DePuy Revision femur size 2.5, Revision tibial tray size 2.5, 10-mm polyethylene insert.  SURGEON:  Vanita Panda. Magnus Ivan, MD  ANESTHESIA:  Spinal.  BLOOD LOSS:  Less than 500 mL.  TOURNIQUET TIME:  2 hours.  ANTIBIOTICS:  1 g IV vancomycin after cultures obtained.  FINDINGS:  Loose femoral and tibial components with large effusion and no evidence of infection.  Stat Gram stain was negative for organisms and white blood cells.  INDICATIONS:  Kelly [name] is a 66 year old female, who several years ago in [facility], underwent a left total knee arthroplasty. 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  I saw her in my office a couple of years ago and recommended she undergo at least an arthroscopic intervention.  She had been doing well for the last 2 years, then developed swelling in her knee.  Radiographic evidence showed evidence of loosening of the components.  Her range of motion is also limited at -5 degrees of full extension to only about 90 degrees of flexion.  I talked about revision arthroplasty, and we drew fluid from the knee to make sure there was no infection and there was infection.  Her infectious labs were all normal as well.  She still could end up needing to have a two-staged revision pending our  intraoperative findings.  She gives informed consent for surgery.  PROCEDURE DESCRIPTION:  After informed consent was obtained, appropriate left leg was marked.  She was brought to the operating room and placed supine on the operating table.  Spinal anesthesia was then obtained.  A Foley catheter was placed.  A nonsterile tourniquet around her upper left thigh.  Her left leg was prepped and draped from the thigh down to the toes with DuraPrep and sterile drapes including sterile stockinette. Time-out was called.  She was identified as correct patient and correct left knee.  We then used an Esmarch to wrap out the leg and tourniquet was elevated to 300 mm of pressure.  A midline incision was then made over previous incision.  We dissected down to the knee joint, and then, I performed a medial parapatellar arthrotomy and encountered a large yellow effusion from the knee.  Cultures were sent, and after cultures were sent, she was given a gram of vancomycin.  I then used 3 L of normal saline solution to lavage the knee using pulsatile lavage.  I did not find any gross evidence of infection, and stat Gram stain came back for negative organisms and white blood cells.  I then removed the polyethylene easily and the tibial tray came out  with just a few hits as well the femur showing as a definite loosening component.  I then cleaned the scar tissue from around the knee and began with a revision process.  I freshened up my tibial cut as well as my distal femoral cut. I drilled for tibia and femoral components as well as made my anterior and posterior femoral cuts and my chamfer cuts.  On the tibia, we drilled for a Revision tibia stem as well as the femoral stem as well. We then trialed a size 2.5 femur followed by 2.5 tibial tray, and I trialed a size 10 mm and 12.5 mm inserts.  I then removed all trial instrumentation and copiously irrigated the knee again with another 3 L of normal saline  solution.  We then cemented the real size 2.5 tibial tray with a stem followed by the real 2.5 femur.  I trialed the 10 and 12.5 mm insert since I had to go with a 10 mm insert because this did give her full extension and the fact that she scarred down so much.  We then let the tourniquet down.  Hemostasis was obtained with electrocautery.  I placed a medium Hemovac drain in the arthrotomy and closed the arthrotomy with interrupted #1 Vicryl suture and a 0 V-Loc suture followed by 2-0 Vicryl in subcutaneous tissue and staples on the skin.  A well-padded sterile dressing was applied.  She was taken to the recovery room in stable condition.  All final counts were correct. There were no complications noted.     Vanita Panda. Magnus Ivan, M.D.     CYB/MEDQ  D:  10/12/2011  T:  10/13/2011  Job:  [id number]

## 2011-10-13 NOTE — Progress Notes (Signed)
Clinical Social Work Department CLINICAL SOCIAL WORK PLACEMENT NOTE 10/13/2011  Patient:  Kelly [name], Kelly [name]  Account Number:  [id number] Admit date:  10/12/2011  Clinical Social Worker:  Cori Razor, LCSW  Date/time:  10/13/2011 04:01 PM  Clinical Social Work is seeking post-discharge placement for this patient at the following level of care:   SKILLED NURSING   (*CSW will update this form in Epic as items are completed)   10/13/2011  Patient/family provided with [facility] Department of Clinical Social Work's list of facilities offering this level of care within the geographic area requested by the patient (or if unable, by the patient's family).  10/13/2011  Patient/family informed of their freedom to choose among providers that offer the needed level of care, that participate in Medicare, Medicaid or managed care program needed by the patient, have an available bed and are willing to accept the patient.    Patient/family informed of [facility]' ownership interest in [facility], as well as of the fact that they are under no obligation to receive care at this facility.  PASARR submitted to EDS on 10/13/2011 PASARR number received from EDS on 10/13/2011  FL2 transmitted to all facilities in geographic area requested by pt/family on  10/13/2011 FL2 transmitted to all facilities within larger geographic area on   Patient informed that his/her managed care company has contracts with or will negotiate with  certain facilities, including the following:     Patient/family informed of bed offers received:  10/13/2011 Patient chooses bed at  Physician recommends and patient chooses bed at    Patient to be transferred to  on   Patient to be transferred to facility by   The following physician request were entered in Epic:   Additional Comments:  Cori Razor LCSW [phone]

## 2011-10-13 NOTE — Progress Notes (Signed)
FL2 in  chart for MD signature. SNF search initiated and bed offers provided. Pt is interested in [facility] ( [facility] ). CSW is waiting for a response from [facility]. CSW will continue to follow to assist with d/c planning to SNF when stable.  Cori Razor LCSW [phone]

## 2011-10-14 LAB — CBC
HCT: 25.6 % — ABNORMAL LOW (ref 36.0–46.0)
MCHC: 33.2 g/dL (ref 30.0–36.0)
MCV: 89.2 fL (ref 78.0–100.0)
Platelets: 169 10*3/uL (ref 150–400)
RDW: 14.1 % (ref 11.5–15.5)

## 2011-10-14 LAB — WOUND CULTURE
Culture: NO GROWTH
Gram Stain: NONE SEEN

## 2011-10-14 NOTE — Evaluation (Signed)
Occupational Therapy Evaluation Patient Details Name: Kelly [name] MRN: [id number] DOB: Aug 08, 1945 Today's Date: 10/14/2011 Time: 4696-2952 OT Time Calculation (min): 44 min  OT Assessment / Plan / Recommendation Clinical Impression  Pt is recovering from L TKA revision.  She progression steadily in mobility in preparation for ADL.  Plans to go to SNF for ST rehab.  Will defer further OT to SNF.    OT Assessment  All further OT needs can be met in the next venue of care    Follow Up Recommendations  Skilled nursing facility    Barriers to Discharge      Equipment Recommendations  Defer to next venue    Recommendations for Other Services    Frequency       Precautions / Restrictions Precautions Precautions: Knee;Fall Required Braces or Orthoses: Knee Immobilizer - Left Knee Immobilizer - Left: Discontinue once straight leg raise with < 10 degree lag Restrictions Weight Bearing Restrictions: No Other Position/Activity Restrictions: WBAT   Pertinent Vitals/Pain     ADL  Eating/Feeding: Simulated;Independent Where Assessed - Eating/Feeding: Chair Grooming: Simulated;Set up Where Assessed - Grooming: Unsupported sitting Upper Body Bathing: Simulated;Set up Where Assessed - Upper Body Bathing: Unsupported sitting Lower Body Bathing: Simulated;Maximal assistance Where Assessed - Lower Body Bathing: Supported sit to stand Upper Body Dressing: Performed;Set up Where Assessed - Upper Body Dressing: Unsupported sitting Lower Body Dressing: Simulated;Maximal assistance Where Assessed - Lower Body Dressing: Supported sit to stand Equipment Used: Rolling walker;Gait belt;Knee Immobilizer Transfers/Ambulation Related to ADLs: Min assist to ambulate with RW, verbal cues for technique. ADL Comments: Pt has had multiple knee surgeries, but does not have AE at home.    OT Diagnosis: Generalized weakness;Acute pain  OT Problem List: Decreased activity tolerance;Decreased  strength;Pain;Impaired balance (sitting and/or standing);Decreased knowledge of use of DME or AE OT Treatment Interventions:     OT Goals    Visit Information  Last OT Received On: 10/14/11 Assistance Needed: +2 PT/OT Co-Evaluation/Treatment: Yes    Subjective Data  Subjective: "Do you want to see my tatoos." Patient Stated Goal: ST rehab at SNF   Prior Functioning  Vision/Perception  Home Living Lives With: Alone Available Help at Discharge: Skilled Nursing Facility Prior Function Level of Independence: Independent with assistive device(s) Communication Communication: No difficulties Dominant Hand: Right      Cognition  Overall Cognitive Status: Appears within functional limits for tasks assessed/performed Arousal/Alertness: Awake/alert Orientation Level: Appears intact for tasks assessed Behavior During Session: [facility] for tasks performed    Extremity/Trunk Assessment Right Upper Extremity Assessment RUE ROM/Strength/Tone: [facility] for tasks assessed Left Upper Extremity Assessment LUE ROM/Strength/Tone: [facility] for tasks assessed   Mobility  Shoulder Instructions  Bed Mobility Bed Mobility: Sit to Supine Supine to Sit: 3: Mod assist Transfers Transfers: Sit to Stand;Stand to Sit Sit to Stand: From bed;4: Min assist Stand to Sit: To chair/3-in-1;3: Mod assist Details for Transfer Assistance: cues for use of UEs and for LE management       Exercise     Balance     End of Session OT - End of Session Activity Tolerance: Patient tolerated treatment well Patient left: in chair;with call bell/phone within reach  GO     Evern Bio 10/14/2011, 1:22 PM [phone]

## 2011-10-14 NOTE — Progress Notes (Signed)
Subjective: 2 Days Post-Op Procedure(s) (LRB): TOTAL KNEE REVISION (Left) Patient reports pain as moderate.  Poor pain tolerance in general that limits her mobility.  Hgb down, but asymptomatic ABLA.  Objective: Vital signs in last 24 hours: Temp:  [97.2 F (36.2 C)-99.5 F (37.5 C)] 99.5 F (37.5 C) (08/31 0659) Pulse Rate:  [79-88] 87  (08/31 0659) Resp:  [10-21] 10  (08/31 0749) BP: (93-123)/(61-76) 93/66 mmHg (08/31 0659) SpO2:  [98 %-100 %] 100 % (08/31 0749)  Intake/Output from previous day: 08/30 0701 - 08/31 0700 In: 2200.2 [P.O.:720; I.V.:1477.2; IV Piggyback:3] Out: 2000 [Urine:2000] Intake/Output this shift: Total I/O In: 240 [P.O.:240] Out: -    Basename 10/14/11 0610 10/13/11 0423  HGB 8.5* 9.0*    Basename 10/14/11 0610 10/13/11 0423  WBC 7.0 6.0  RBC 2.87* 3.01*  HCT 25.6* 26.7*  PLT 169 179    Basename 10/13/11 0423  NA 132*  K 4.1  CL 99  CO2 24  BUN 19  CREATININE 1.21*  GLUCOSE 119*  CALCIUM 8.7   No results found for this basename: LABPT:2,INR:2 in the last 72 hours  Sensation intact distally Intact pulses distally Dorsiflexion/Plantar flexion intact Incision: scant drainage Compartment soft  Assessment/Plan: 2 Days Post-Op Procedure(s) (LRB): TOTAL KNEE REVISION (Left) Up with therapy Discharge to SNF the first of next week.  Kelly [name] 10/14/2011, 9:20 AM

## 2011-10-14 NOTE — Progress Notes (Signed)
Pt transferred to 1527. 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 Shonteria Abeln, [employer]

## 2011-10-14 NOTE — Progress Notes (Signed)
Physical Therapy Treatment Patient Details Name: Kelly [name] MRN: [id number] DOB: [birth year] Today's Date: 10/14/2011 Time: 1440-1540 PT Time Calculation (min): 60 min  PT Assessment / Plan / Recommendation Comments on Treatment Session  Pt continues to progress slowly.  Mentioned that she wants to progress to oral pain meds vs PCA.  RN notified.     Follow Up Recommendations  Skilled nursing facility    Barriers to Discharge        Equipment Recommendations  Defer to next venue    Recommendations for Other Services    Frequency 7X/week   Plan Discharge plan remains appropriate    Precautions / Restrictions Precautions Precautions: Knee;Fall Required Braces or Orthoses: Knee Immobilizer - Left Knee Immobilizer - Left: Discontinue once straight leg raise with < 10 degree lag Restrictions Weight Bearing Restrictions: No Other Position/Activity Restrictions: WBAT   Pertinent Vitals/Pain 10/10, RN aware, ice applied    Mobility  Bed Mobility Bed Mobility: Sit to Supine Supine to Sit: 3: Mod assist Sit to Supine: 4: Min assist;HOB elevated;3: Mod assist Details for Bed Mobility Assistance: Assist for LLE into bed with cues for hand placement for controlled descent of trunk.  Transfers Transfers: Stand to Sit;Sit to Stand Sit to Stand: 4: Min assist;With upper extremity assist;With armrests;From chair/3-in-1 Stand to Sit: 4: Min assist;With upper extremity assist;To bed;To elevated surface Details for Transfer Assistance: Performed x 2 in order to use 3in1 in restroom.  cues for hand placement, safety and LE management.  Ambulation/Gait Ambulation/Gait Assistance: 4: Min assist Ambulation Distance (Feet): 60 Feet Assistive device: Rolling walker Ambulation/Gait Assistance Details: Min cues for sequencing/technique and upright posture.  Gait Pattern: Step-to pattern Gait velocity: sloooow    Exercises Total Joint Exercises Ankle Circles/Pumps: AROM;Both;20  reps Quad Sets: AROM;Left;10 reps Heel Slides: AAROM;10 reps;Supine;Left Hip ABduction/ADduction: AAROM;Left;10 reps Straight Leg Raises: AAROM;10 reps;Left;Supine   PT Diagnosis:    PT Problem List:   PT Treatment Interventions:     PT Goals Acute Rehab PT Goals PT Goal Formulation: With patient Time For Goal Achievement: 10/19/11 Potential to Achieve Goals: Good Pt will go Supine/Side to Sit: with supervision PT Goal: Supine/Side to Sit - Progress: Progressing toward goal Pt will go Sit to Supine/Side: with supervision PT Goal: Sit to Supine/Side - Progress: Progressing toward goal Pt will go Sit to Stand: with supervision PT Goal: Sit to Stand - Progress: Progressing toward goal Pt will go Stand to Sit: with supervision PT Goal: Stand to Sit - Progress: Progressing toward goal Pt will Ambulate: 51 - 150 feet;with supervision;with rolling walker PT Goal: Ambulate - Progress: Progressing toward goal  Visit Information  Last PT Received On: 10/14/11 Assistance Needed: +1    Subjective Data  Subjective: I'm ready to get up again Patient Stated Goal: Go to rehab and then home alone   Cognition  Overall Cognitive Status: Appears within functional limits for tasks assessed/performed Arousal/Alertness: Awake/alert Orientation Level: Appears intact for tasks assessed Behavior During Session: [facility] for tasks performed    Balance     End of Session PT - End of Session Equipment Utilized During Treatment: Left knee immobilizer Activity Tolerance: Patient limited by pain Patient left: in bed;with call bell/phone within reach Nurse Communication: Mobility status   GP     Page, Meribeth Mattes 10/14/2011, 5:02 PM

## 2011-10-14 NOTE — Progress Notes (Signed)
Physical Therapy Treatment Patient Details Name: Kelly [name] MRN: [id number] DOB: 1945/09/04 Today's Date: 10/14/2011 Time: 4098-1191 PT Time Calculation (min): 44 min  PT Assessment / Plan / Recommendation Comments on Treatment Session  Pt with less c/o dizziness, however with increased pain and very slow gait speed.     Follow Up Recommendations  Skilled nursing facility    Barriers to Discharge        Equipment Recommendations  Defer to next venue    Recommendations for Other Services    Frequency 7X/week   Plan Discharge plan remains appropriate    Precautions / Restrictions Precautions Precautions: Knee;Fall Required Braces or Orthoses: Knee Immobilizer - Left Knee Immobilizer - Left: Discontinue once straight leg raise with < 10 degree lag Restrictions Weight Bearing Restrictions: No Other Position/Activity Restrictions: WBAT   Pertinent Vitals/Pain 10/10, ice applied, repositioned.     Mobility  Bed Mobility Bed Mobility: Supine to Sit Supine to Sit: 3: Mod assist Details for Bed Mobility Assistance: Assist for LLE and for trunk with cues for hand placement and technique to self assist trunk.  Transfers Transfers: Stand to Sit;Sit to Stand Sit to Stand: 4: Min assist;From elevated surface;With upper extremity assist;From bed Stand to Sit: 4: Min assist;With upper extremity assist;With armrests;To chair/3-in-1 Details for Transfer Assistance: Cues for hand placement and LE management when sitting/standing.  Ambulation/Gait Ambulation/Gait Assistance: 4: Min assist;3: Mod assist Ambulation Distance (Feet): 60 Feet Assistive device: Rolling walker Ambulation/Gait Assistance Details: Cues for sequencing/technique with RW, upright posture and not to step too far inside of RW.  Gait Pattern: Step-to pattern Gait velocity: sloooow    Exercises     PT Diagnosis:    PT Problem List:   PT Treatment Interventions:     PT Goals Acute Rehab PT Goals PT Goal  Formulation: With patient Time For Goal Achievement: 10/19/11 Potential to Achieve Goals: Good Pt will go Supine/Side to Sit: with supervision PT Goal: Supine/Side to Sit - Progress: Progressing toward goal Pt will go Sit to Stand: with supervision PT Goal: Sit to Stand - Progress: Progressing toward goal Pt will go Stand to Sit: with supervision PT Goal: Stand to Sit - Progress: Progressing toward goal Pt will Ambulate: 51 - 150 feet;with supervision;with rolling walker PT Goal: Ambulate - Progress: Progressing toward goal  Visit Information  Last PT Received On: 10/14/11 Assistance Needed: +2    Subjective Data  Subjective: As long as I can tolerate the pain, I can do it.  Patient Stated Goal: Go to rehab and then home alone   Cognition  Overall Cognitive Status: Appears within functional limits for tasks assessed/performed Arousal/Alertness: Awake/alert Orientation Level: Appears intact for tasks assessed Behavior During Session: [facility] for tasks performed    Balance     End of Session PT - End of Session Equipment Utilized During Treatment: Left knee immobilizer Activity Tolerance: Patient limited by pain Patient left: in chair;with call bell/phone within reach Nurse Communication: Mobility status   GP     Page, Meribeth Mattes 10/14/2011, 1:30 PM

## 2011-10-15 LAB — CBC
MCH: 30.1 pg (ref 26.0–34.0)
MCHC: 33.8 g/dL (ref 30.0–36.0)
Platelets: 154 10*3/uL (ref 150–400)

## 2011-10-15 NOTE — Progress Notes (Signed)
Subjective: 3 Days Post-Op Procedure(s) (LRB): TOTAL KNEE REVISION (Left) Patient reports pain as mild.    Objective: Vital signs in last 24 hours: Temp:  [98.8 F (37.1 C)] 98.8 F (37.1 C) (08/31 2202) Pulse Rate:  [80-86] 80  (08/31 2202) Resp:  [9-18] 16  (09/01 0747) BP: (91-102)/(52-60) 102/52 mmHg (08/31 2202) SpO2:  [95 %-99 %] 97 % (09/01 0413)  Intake/Output from previous day: 08/31 0701 - 09/01 0700 In: 703.3 [P.O.:240; I.V.:463.3] Out: 2350 [Urine:2350] Intake/Output this shift:     Basename 10/15/11 0452 10/14/11 0610 10/13/11 0423  HGB 7.8* 8.5* 9.0*    Basename 10/15/11 0452 10/14/11 0610  WBC 5.8 7.0  RBC 2.59* 2.87*  HCT 23.1* 25.6*  PLT 154 169    Basename 10/13/11 0423  NA 132*  K 4.1  CL 99  CO2 24  BUN 19  CREATININE 1.21*  GLUCOSE 119*  CALCIUM 8.7   No results found for this basename: LABPT:2,INR:2 in the last 72 hours  Neurologically intact  Assessment/Plan: 3 Days Post-Op Procedure(s) (LRB): TOTAL KNEE REVISION (Left) Up with therapy dressing change . Acute blood loss anemia. She is feeling tired , dizzy.   D/C PCA and IV  YATES,MARK C 10/15/2011, 7:56 AM

## 2011-10-15 NOTE — Progress Notes (Signed)
Physical Therapy Treatment Patient Details Name: Kelly [name] MRN: [id number] DOB: [birth year] Today's Date: 10/15/2011 Time: 1324-4010 PT Time Calculation (min): 44 min  PT Assessment / Plan / Recommendation Comments on Treatment Session  Pt progressing with ambulation distance, however very slow gait speed.  Pt c/o some dizziness during ambulation, however was able to continue until end of ambulation and needed to sit.  BP was 91/53 once in chair.     Follow Up Recommendations  Skilled nursing facility    Barriers to Discharge        Equipment Recommendations  Defer to next venue    Recommendations for Other Services    Frequency 7X/week   Plan Discharge plan remains appropriate    Precautions / Restrictions Precautions Precautions: Knee;Fall Required Braces or Orthoses: Knee Immobilizer - Left Knee Immobilizer - Left: Discontinue once straight leg raise with < 10 degree lag Restrictions Weight Bearing Restrictions: No Other Position/Activity Restrictions: WBAT   Pertinent Vitals/Pain 10/10, pain medication given by RN during ambulation, ice packs applied     Mobility  Bed Mobility Bed Mobility: Supine to Sit Supine to Sit: 4: Min assist Details for Bed Mobility Assistance: Assist for LLE out of bed with cues for hand placement on bed vs rails to better simulate home environment.  Transfers Transfers: Stand to Sit;Sit to Stand Sit to Stand: 4: Min assist;With upper extremity assist;From bed;From elevated surface Stand to Sit: 4: Min assist;With armrests;To chair/3-in-1 Details for Transfer Assistance: Min cues for hand placement, LE management and safety.  Ambulation/Gait Ambulation/Gait Assistance: 4: Min assist Ambulation Distance (Feet): 200 Feet (Took several standing rest breaks) Assistive device: Rolling walker Ambulation/Gait Assistance Details: Min cues for relaxed posture, sequencing/technique. Again, took several standing rest breaks due to some  dizziness/fatigue/UE weakness.   Gait Pattern: Step-to pattern Gait velocity: sloooow    Exercises     PT Diagnosis:    PT Problem List:   PT Treatment Interventions:     PT Goals Acute Rehab PT Goals PT Goal Formulation: With patient Time For Goal Achievement: 10/19/11 Potential to Achieve Goals: Good Pt will go Supine/Side to Sit: with supervision PT Goal: Supine/Side to Sit - Progress: Progressing toward goal Pt will go Sit to Stand: with supervision PT Goal: Sit to Stand - Progress: Progressing toward goal Pt will go Stand to Sit: with supervision PT Goal: Stand to Sit - Progress: Progressing toward goal Pt will Ambulate: 51 - 150 feet;with supervision;with rolling walker PT Goal: Ambulate - Progress: Progressing toward goal  Visit Information  Last PT Received On: 10/15/11 Assistance Needed: +1    Subjective Data  Subjective: I'm on less pain medication today, so I want to see how I do.  Patient Stated Goal: Go to rehab and then home alone   Cognition  Overall Cognitive Status: Appears within functional limits for tasks assessed/performed Arousal/Alertness: Awake/alert Orientation Level: Appears intact for tasks assessed Behavior During Session: [facility] for tasks performed    Balance     End of Session PT - End of Session Equipment Utilized During Treatment: Left knee immobilizer Activity Tolerance: Patient limited by pain Patient left: in chair;with call bell/phone within reach Nurse Communication: Mobility status;Other (comment) (Pts BP)   GP     Page, Meribeth Mattes 10/15/2011, 10:27 AM

## 2011-10-15 NOTE — Progress Notes (Signed)
Physical Therapy Treatment Patient Details Name: Kelly [name] MRN: [id number] DOB: 1945/12/28 Today's Date: 10/15/2011 Time: 4098-1191 PT Time Calculation (min): 21 min  PT Assessment / Plan / Recommendation Comments on Treatment Session  Pt with increased pain during pm from earlier ambulation in hallway, therefore deferred second ambulation.  Pt very motivated to do her best, however PT educated on balance of activity to prevent increased soreness.     Follow Up Recommendations  Skilled nursing facility    Barriers to Discharge        Equipment Recommendations  Defer to next venue    Recommendations for Other Services    Frequency 7X/week   Plan Discharge plan remains appropriate    Precautions / Restrictions Precautions Precautions: Knee;Fall Required Braces or Orthoses: Knee Immobilizer - Left Knee Immobilizer - Left: Discontinue once straight leg raise with < 10 degree lag Restrictions Weight Bearing Restrictions: No Other Position/Activity Restrictions: WBAT   Pertinent Vitals/Pain 10/10    Mobility  Bed Mobility Bed Mobility: Not assessed Supine to Sit: 4: Min assist Details for Bed Mobility Assistance: Assist for LLE out of bed with cues for hand placement on bed vs rails to better simulate home environment.  Transfers Transfers: Not assessed Sit to Stand: 4: Min assist;With upper extremity assist;From bed;From elevated surface Stand to Sit: 4: Min assist;With armrests;To chair/3-in-1 Details for Transfer Assistance: Min cues for hand placement, LE management and safety.  Ambulation/Gait Ambulation/Gait Assistance: Not tested (comment) Ambulation Distance (Feet): 200 Feet (Took several standing rest breaks) Assistive device: Rolling walker Ambulation/Gait Assistance Details: Min cues for relaxed posture, sequencing/technique. Again, took several standing rest breaks due to some dizziness/fatigue/UE weakness.   Gait Pattern: Step-to pattern Gait velocity:  sloooow    Exercises Total Joint Exercises Ankle Circles/Pumps: AROM;Both;20 reps Quad Sets: AROM;Left;10 reps Short Arc QuadBarbaraann [name];Left;10 reps Heel Slides: AAROM;Supine;Left;15 reps Hip ABduction/ADduction: AAROM;Left;10 reps Straight Leg Raises: AAROM;10 reps;Left;Supine   PT Diagnosis:    PT Problem List:   PT Treatment Interventions:     PT Goals Acute Rehab PT Goals PT Goal Formulation: With patient Time For Goal Achievement: 10/19/11 Potential to Achieve Goals: Good Pt will go Supine/Side to Sit: with supervision PT Goal: Supine/Side to Sit - Progress: Progressing toward goal Pt will go Sit to Stand: with supervision PT Goal: Sit to Stand - Progress: Progressing toward goal Pt will go Stand to Sit: with supervision PT Goal: Stand to Sit - Progress: Progressing toward goal Pt will Ambulate: 51 - 150 feet;with supervision;with rolling walker PT Goal: Ambulate - Progress: Progressing toward goal  Visit Information  Last PT Received On: 10/15/11 Assistance Needed: +1    Subjective Data  Subjective: I'm just having a moment (started crying while performing exercises).  I'm ok Patient Stated Goal: Go to rehab and then home alone   Cognition  Overall Cognitive Status: Appears within functional limits for tasks assessed/performed Arousal/Alertness: Awake/alert Orientation Level: Appears intact for tasks assessed Behavior During Session: [facility] for tasks performed    Balance     End of Session PT - End of Session Equipment Utilized During Treatment: Left knee immobilizer Activity Tolerance: Patient limited by pain Patient left: in bed;with call bell/phone within reach Nurse Communication: Mobility status;Other (comment) (Pts BP)   GP     Kelly [name], Kelly [name] 10/15/2011, 2:08 PM

## 2011-10-16 LAB — PREPARE RBC (CROSSMATCH)

## 2011-10-16 NOTE — Progress Notes (Signed)
Physical Therapy Treatment Patient Details Name: Kelly [name] MRN: [id number] DOB: Aug 29, 1945 Today's Date: 10/16/2011 Time: 4098-1191 PT Time Calculation (min): 24 min  PT Assessment / Plan / Recommendation Comments on Treatment Session       Follow Up Recommendations  Skilled nursing facility    Barriers to Discharge        Equipment Recommendations  Defer to next venue    Recommendations for Other Services OT consult  Frequency 7X/week   Plan Discharge plan remains appropriate    Precautions / Restrictions Precautions Precautions: Knee;Fall Required Braces or Orthoses: Knee Immobilizer - Left Knee Immobilizer - Left: Discontinue once straight leg raise with < 10 degree lag Restrictions Weight Bearing Restrictions: No Other Position/Activity Restrictions: WBAT   Pertinent Vitals/Pain 10/10 with knee flex; pt premedicated, RN aware, cold packs provided    Mobility  Bed Mobility Bed Mobility: Sit to Supine Sit to Supine: 4: Min assist Details for Bed Mobility Assistance: assist for L LE with min cues for use of UEs Transfers Transfers: Sit to Stand;Stand to Sit Sit to Stand: 4: Min assist Stand to Sit: 4: Min assist Details for Transfer Assistance: min cues for use of UEs; min assist for LE management Ambulation/Gait Ambulation/Gait Assistance: 4: Min assist Ambulation Distance (Feet): 5 Feet Assistive device: Rolling walker Ambulation/Gait Assistance Details: cues for sequence and position from RW Gait Pattern: Step-to pattern    Exercises Total Joint Exercises Ankle Circles/Pumps: AROM;Both;20 reps Quad Sets: AROM;20 reps;Supine;Both Heel Slides: 20 reps;Supine;Left;AAROM Straight Leg Raises: AAROM;20 reps;Left;Supine   PT Diagnosis:    PT Problem List:   PT Treatment Interventions:     PT Goals Acute Rehab PT Goals PT Goal Formulation: With patient Time For Goal Achievement: 10/19/11 Potential to Achieve Goals: Good Pt will go Supine/Side to  Sit: with supervision PT Goal: Supine/Side to Sit - Progress: Progressing toward goal Pt will go Sit to Supine/Side: with supervision PT Goal: Sit to Supine/Side - Progress: Progressing toward goal Pt will go Sit to Stand: with supervision PT Goal: Sit to Stand - Progress: Progressing toward goal Pt will go Stand to Sit: with supervision PT Goal: Stand to Sit - Progress: Progressing toward goal Pt will Ambulate: 51 - 150 feet;with supervision;with rolling walker PT Goal: Ambulate - Progress: Progressing toward goal  Visit Information  Last PT Received On: 10/16/11 Assistance Needed: +1    Subjective Data  Subjective: I'm just so cold Patient Stated Goal: Go to rehab and then home alone   Cognition  Overall Cognitive Status: Appears within functional limits for tasks assessed/performed Arousal/Alertness: Awake/alert Orientation Level: Appears intact for tasks assessed Behavior During Session: [facility] for tasks performed    Balance     End of Session PT - End of Session Equipment Utilized During Treatment: Left knee immobilizer Activity Tolerance: Patient limited by pain Patient left: in bed;with call bell/phone within reach Nurse Communication: Mobility status;Other (comment) CPM Left Knee CPM Left Knee: Off   GP     Yorley Buch 10/16/2011, 9:01 AM

## 2011-10-16 NOTE — Progress Notes (Signed)
Subjective: 4 Days Post-Op Procedure(s) (LRB): TOTAL KNEE REVISION (Left) Patient reports pain as mild.  Hgb down yesterday to 7.8.  She also seemed to be light-headed when up with PT suggesting symptomatic acute blood loss anemia.  Objective: Vital signs in last 24 hours: Temp:  [97.7 F (36.5 C)-99.1 F (37.3 C)] 98.6 F (37 C) (09/02 0428) Pulse Rate:  [76-93] 86  (09/02 0428) Resp:  [16-18] 16  (09/02 0428) BP: (84-121)/(56-65) 106/65 mmHg (09/02 0428) SpO2:  [90 %-95 %] 90 % (09/02 0428)  Intake/Output from previous day: 09/01 0701 - 09/02 0700 In: 889 [P.O.:840; I.V.:49] Out: 2025 [Urine:2025] Intake/Output this shift:     Basename 10/15/11 0452 10/14/11 0610  HGB 7.8* 8.5*    Basename 10/15/11 0452 10/14/11 0610  WBC 5.8 7.0  RBC 2.59* 2.87*  HCT 23.1* 25.6*  PLT 154 169   No results found for this basename: NA:2,K:2,CL:2,CO2:2,BUN:2,CREATININE:2,GLUCOSE:2,CALCIUM:2 in the last 72 hours No results found for this basename: LABPT:2,INR:2 in the last 72 hours  Sensation intact distally Intact pulses distally Dorsiflexion/Plantar flexion intact Incision: dressing C/D/I Compartment soft  Assessment/Plan: 4 Days Post-Op Procedure(s) (LRB): TOTAL KNEE REVISION (Left) Up with therapy Will transfuse one unit of blood today and d/c to ST-SNF tomorrow.  Devinne Epstein Y 10/16/2011, 8:14 AM

## 2011-10-16 NOTE — Progress Notes (Signed)
CSW assisting with d/c planning. Pt would like to have ST rehab at [facility] ( [place] [facility] SNF will have an opening for pt on Tuesday 9/3. CSW will assist with d/c planning to [facility] when ready for d/c.   Cori Razor LCSW [phone]

## 2011-10-16 NOTE — Progress Notes (Signed)
Physical Therapy Treatment Patient Details Name: Kelly [name] MRN: [id number] DOB: [birth year] Today's Date: 10/16/2011 Time: 4098-1191 PT Time Calculation (min): 33 min  PT Assessment / Plan / Recommendation Comments on Treatment Session       Follow Up Recommendations  Skilled nursing facility    Barriers to Discharge        Equipment Recommendations  Defer to next venue    Recommendations for Other Services OT consult  Frequency 7X/week   Plan Discharge plan remains appropriate    Precautions / Restrictions Precautions Precautions: Knee;Fall Required Braces or Orthoses: Knee Immobilizer - Left Knee Immobilizer - Left: Discontinue once straight leg raise with < 10 degree lag Restrictions Weight Bearing Restrictions: No Other Position/Activity Restrictions: WBAT   Pertinent Vitals/Pain 4/10    Mobility  Bed Mobility Bed Mobility: Sit to Supine;Supine to Sit Supine to Sit: 4: Min assist Sit to Supine: 4: Min assist Details for Bed Mobility Assistance: assist for L LE with min cues for use of UEs Transfers Transfers: Sit to Stand;Stand to Sit Sit to Stand: 4: Min assist Stand to Sit: 4: Min assist Details for Transfer Assistance: min cues for use of UEs; min assist for LE management Ambulation/Gait Ambulation/Gait Assistance: 4: Min assist Ambulation Distance (Feet): 176 Feet Assistive device: Rolling walker Ambulation/Gait Assistance Details: min cues for posture and position from RW Gait Pattern: Step-to pattern Gait velocity: sloooow    Exercises     PT Diagnosis:    PT Problem List:   PT Treatment Interventions:     PT Goals Acute Rehab PT Goals PT Goal Formulation: With patient Time For Goal Achievement: 10/19/11 Potential to Achieve Goals: Good Pt will go Supine/Side to Sit: with supervision PT Goal: Supine/Side to Sit - Progress: Progressing toward goal Pt will go Sit to Supine/Side: with supervision PT Goal: Sit to Supine/Side - Progress:  Progressing toward goal Pt will go Sit to Stand: with supervision PT Goal: Sit to Stand - Progress: Progressing toward goal Pt will go Stand to Sit: with supervision PT Goal: Stand to Sit - Progress: Progressing toward goal Pt will Ambulate: 51 - 150 feet;with supervision;with rolling walker PT Goal: Ambulate - Progress: Progressing toward goal  Visit Information  Last PT Received On: 10/16/11 Assistance Needed: +1    Subjective Data  Subjective: I'm feeling much better than this morning Patient Stated Goal: Go to rehab and then home alone   Cognition  Overall Cognitive Status: Appears within functional limits for tasks assessed/performed Arousal/Alertness: Awake/alert Orientation Level: Appears intact for tasks assessed Behavior During Session: [facility] for tasks performed    Balance     End of Session PT - End of Session Equipment Utilized During Treatment: Left knee immobilizer Activity Tolerance: Patient tolerated treatment well Patient left: in bed;with call bell/phone within reach Nurse Communication: Mobility status;Other (comment) CPM Left Knee CPM Left Knee: On   GP     Kelly [name] 10/16/2011, 3:57 PM

## 2011-10-17 DIAGNOSIS — Z5189 Encounter for other specified aftercare: Secondary | ICD-10-CM | POA: Diagnosis not present

## 2011-10-17 DIAGNOSIS — E119 Type 2 diabetes mellitus without complications: Secondary | ICD-10-CM | POA: Diagnosis not present

## 2011-10-17 DIAGNOSIS — S99919A Unspecified injury of unspecified ankle, initial encounter: Secondary | ICD-10-CM | POA: Diagnosis not present

## 2011-10-17 DIAGNOSIS — Z471 Aftercare following joint replacement surgery: Secondary | ICD-10-CM | POA: Diagnosis not present

## 2011-10-17 DIAGNOSIS — M25469 Effusion, unspecified knee: Secondary | ICD-10-CM | POA: Diagnosis not present

## 2011-10-17 DIAGNOSIS — E785 Hyperlipidemia, unspecified: Secondary | ICD-10-CM | POA: Diagnosis not present

## 2011-10-17 DIAGNOSIS — Z96659 Presence of unspecified artificial knee joint: Secondary | ICD-10-CM | POA: Diagnosis not present

## 2011-10-17 DIAGNOSIS — S8990XA Unspecified injury of unspecified lower leg, initial encounter: Secondary | ICD-10-CM | POA: Diagnosis not present

## 2011-10-17 DIAGNOSIS — M25569 Pain in unspecified knee: Secondary | ICD-10-CM | POA: Diagnosis not present

## 2011-10-17 DIAGNOSIS — I10 Essential (primary) hypertension: Secondary | ICD-10-CM | POA: Diagnosis not present

## 2011-10-17 DIAGNOSIS — N181 Chronic kidney disease, stage 1: Secondary | ICD-10-CM | POA: Diagnosis not present

## 2011-10-17 DIAGNOSIS — R112 Nausea with vomiting, unspecified: Secondary | ICD-10-CM | POA: Diagnosis not present

## 2011-10-17 DIAGNOSIS — N289 Disorder of kidney and ureter, unspecified: Secondary | ICD-10-CM | POA: Diagnosis not present

## 2011-10-17 DIAGNOSIS — M129 Arthropathy, unspecified: Secondary | ICD-10-CM | POA: Diagnosis not present

## 2011-10-17 DIAGNOSIS — D649 Anemia, unspecified: Secondary | ICD-10-CM | POA: Diagnosis not present

## 2011-10-17 LAB — TYPE AND SCREEN

## 2011-10-17 LAB — ANAEROBIC CULTURE

## 2011-10-17 MED ORDER — METHOCARBAMOL 500 MG PO TABS: 500.0000 mg | ORAL_TABLET | Freq: Four times a day (QID) | ORAL | Status: AC | PRN

## 2011-10-17 MED ORDER — ASPIRIN 325 MG PO TBEC: 325.0000 mg | DELAYED_RELEASE_TABLET | Freq: Two times a day (BID) | ORAL | Status: AC

## 2011-10-17 MED ORDER — OXYCODONE-ACETAMINOPHEN 5-325 MG PO TABS: 1.0000 | ORAL_TABLET | ORAL | Status: AC | PRN

## 2011-10-17 NOTE — Progress Notes (Signed)
Physical Therapy Treatment Patient Details Name: Kelly [name] MRN: [id number] DOB: 07-02-1945 Today's Date: 10/17/2011 Time: 1000-1055 PT Time Calculation (min): 55 min  PT Assessment / Plan / Recommendation Comments on Treatment Session  POD # 5 TKRevision.  Pt plans to D/C today to [facility] in [place].    Follow Up Recommendations  Skilled nursing facility    Barriers to Discharge        Equipment Recommendations  Defer to next venue    Recommendations for Other Services    Frequency 7X/week   Plan Discharge plan remains appropriate    Precautions / Restrictions Precautions Precautions: Knee Precaution Comments: Pt instructed on KI use for amb Restrictions Weight Bearing Restrictions: No Other Position/Activity Restrictions: WBAT    Pertinent Vitals/Pain C/o 6/10 L knee pain meds requested Applied ICE    Mobility  Bed Mobility Bed Mobility: Supine to Sit Supine to Sit: 4: Min assist Sit to Supine: 4: Min assist Details for Bed Mobility Assistance: Min assist to support L LE on/off bed  Transfers Transfers: Sit to Stand;Stand to Sit Sit to Stand: 4: Min assist;From toilet;From bed;From chair/3-in-1 Stand to Sit: 4: Min assist;To toilet;To chair/3-in-1;To bed Details for Transfer Assistance: <25% VC's on proper tech and hand placement  Ambulation/Gait Ambulation/Gait Assistance: 4: Min guard Ambulation Distance (Feet): 250 Feet Assistive device: Rolling walker Ambulation/Gait Assistance Details: <25% VC's on proper walker to self distance and to increase WB thru L LE. Gait Pattern: Step-to pattern;Decreased stance time - left;Trunk flexed Gait velocity: decreased    Exercises Total Joint Exercises Ankle Circles/Pumps: AROM;Both;10 reps;Supine Quad Sets: AROM;Both;10 reps;Supine Gluteal Sets: AROM;Both;10 reps;Supine Towel Squeeze: AROM;Both;10 reps;Supine Heel Slides: AAROM;Left;10 reps;Supine Hip ABduction/ADduction: AAROM;Left;10 reps;Supine Straight Leg  Raises: AAROM;Left;10 reps;Supine    PT Goals                            progressing    Visit Information  Last PT Received On: 10/17/11                   End of Session PT - End of Session Equipment Utilized During Treatment: Gait belt;Left knee immobilizer Activity Tolerance: Patient tolerated treatment well Patient left: in bed;with call bell/phone within reach (ICE to L knee)   Felecia Shelling  PTA [facility]  Acute  Rehab Pager     [phone]

## 2011-10-17 NOTE — Progress Notes (Signed)
CSW will assist with d/c planning to [facility] ( [place] ) today.  Cori Razor LCSW [phone]

## 2011-10-17 NOTE — Discharge Summary (Signed)
Patient ID: Kelly [name] MRN: [id number] DOB/AGE: Jul 31, 1945 66 y.o.  Admit date: 10/12/2011 Discharge date: 10/17/2011  Admission Diagnoses:  Principal Problem:  *Loose total knee arthroplasty   Discharge Diagnoses:  Same  Past Medical History  Diagnosis Date  . Hyperlipidemia   . Hypertension   . Arthritis   . Anemia   . Diabetes mellitus     not on meds last [facility] in [facility] 04/20/11   . Renal insufficiency, mild     CKD- stage I per office visit note of PCP     Surgeries: Procedure(s): TOTAL KNEE REVISION on 10/12/2011   Consultants:    Discharged Condition: Improved  Hospital Course: Kelly [name] is an 66 y.o. female who was admitted 10/12/2011 for operative treatment ofLoose total knee arthroplasty. Patient has severe unremitting pain that affects sleep, daily activities, and work/hobbies. After pre-op clearance the patient was taken to the operating room on 10/12/2011 and underwent  Procedure(s): TOTAL KNEE REVISION.    Patient was given perioperative antibiotics: Anti-infectives     Start     Dose/Rate Route Frequency Ordered Stop   10/12/11 2200   vancomycin (VANCOCIN) IVPB 1000 mg/200 mL premix        1,000 mg 200 mL/hr over 60 Minutes Intravenous Every 12 hours 10/12/11 1654 10/12/11 2230   10/12/11 0744   vancomycin (VANCOCIN) IVPB 1000 mg/200 mL premix        1,000 mg 200 mL/hr over 60 Minutes Intravenous 60 min pre-op 10/12/11 0744 10/12/11 1054           Patient was given sequential compression devices, early ambulation, and chemoprophylaxis to prevent DVT.  She did receive a transfusion due to symptomatic acute blood loss anemia.  Patient benefited maximally from hospital stay and there were no complications.    Recent vital signs: Patient Vitals for the past 24 hrs:  BP Temp Temp src Pulse Resp SpO2  10/17/11 0423 114/73 mmHg 98.6 F (37 C) Oral 91  20  97 %  10/16/11 2105 93/58 mmHg 99 F (37.2 C) Oral - 18  94 %  10/16/11 1800 98/62 mmHg  98.4 F (36.9 C) Oral 84  18  98 %  10/16/11 1510 89/52 mmHg 98.8 F (37.1 C) Oral 81  18  -  10/16/11 1430 94/54 mmHg 98.5 F (36.9 C) Oral 82  20  -  10/16/11 1330 102/66 mmHg 98.6 F (37 C) Oral 81  17  -  10/16/11 1230 101/65 mmHg 98.5 F (36.9 C) Oral 79  18  -  10/16/11 1158 101/65 mmHg 99.1 F (37.3 C) Oral 80  20  -     Recent laboratory studies:  Basename 10/15/11 0452  WBC 5.8  HGB 7.8*  HCT 23.1*  PLT 154  NA --  K --  CL --  CO2 --  BUN --  CREATININE --  GLUCOSE --  INR --  CALCIUM --     Discharge Medications:   Medication List  As of 10/17/2011  6:46 AM   STOP taking these medications         aspirin 81 MG tablet      HYDROcodone-acetaminophen 7.5-325 MG per tablet      ibuprofen 800 MG tablet         TAKE these medications         aspirin 325 MG EC tablet   Take 1 tablet (325 mg total) by mouth 2 (two) times daily.      calcium  carbonate 600 MG Tabs   Commonly known as: OS-CAL   Take 600 mg by mouth every morning.      GLUCOSAMINE 1500 COMPLEX PO   Take 1 tablet by mouth every morning.      lisinopril 20 MG tablet   Commonly known as: PRINIVIL,ZESTRIL   Take 1 tablet (20 mg total) by mouth every morning.      methocarbamol 500 MG tablet   Commonly known as: ROBAXIN   Take 1 tablet (500 mg total) by mouth every 6 (six) hours as needed.      multivitamin tablet   Take 1 tablet by mouth daily.      oxyCODONE-acetaminophen 5-325 MG per tablet   Commonly known as: PERCOCET/ROXICET   Take 1-2 tablets by mouth every 4 (four) hours as needed for pain.      rosuvastatin 10 MG tablet   Commonly known as: CRESTOR   Take 1 tablet (10 mg total) by mouth daily.      rosuvastatin 10 MG tablet   Commonly known as: CRESTOR   Take 10 mg by mouth daily.      vitamin B-12 1000 MCG tablet   Commonly known as: CYANOCOBALAMIN   Take 1,000 mcg by mouth 2 (two) times daily.      VITAMIN D-3 PO   Take 1 % by mouth daily.      Vitamin D3 1000  UNITS Caps   Take 2,000 Units by mouth every morning.            Diagnostic Studies: Dg Chest 2 View  10/06/2011  *RADIOLOGY REPORT*  Clinical Data: Preop  CHEST - 2 VIEW  Comparison: None.  Findings: Cardiomediastinal silhouette is unremarkable.  No acute infiltrate or pleural effusion.  No pulmonary edema.  Mild degenerative changes mid thoracic spine.  IMPRESSION: No active disease.   Original Report Authenticated By: Natasha Mead, M.D.    X-ray Knee Left Port  10/12/2011  *RADIOLOGY REPORT*  Clinical Data: Postop left knee arthroplasty.  PORTABLE LEFT KNEE - 1-2 VIEW 10/12/2011 1341 hours:  Comparison: None.  Findings: Anatomic alignment post left total knee arthroplasty.  No acute complicating features.  Surgical drain in place.  IMPRESSION: Anatomic alignment post left total knee arthroplasty without acute complicating features.   Original Report Authenticated By: Arnell Sieving, M.D.     Disposition: to skilled nursing facility  Discharge Orders    Future Appointments: Provider: Department: Dept Phone: Center:   10/23/2011 9:45 AM Margaree Mackintosh, MD [facility] [phone]     Future Orders Please Complete By Expires   Diet - low sodium heart healthy      Call MD / Call 911      Comments:   If you experience chest pain or shortness of breath, CALL 911 and be transported to the hospital emergency room.  If you develope a fever above 101 F, pus (white drainage) or increased drainage or redness at the wound, or calf pain, call your surgeon's office.   Constipation Prevention      Comments:   Drink plenty of fluids.  Prune juice may be helpful.  You may use a stool softener, such as Colace (over the counter) 100 mg twice a day.  Use MiraLax (over the counter) for constipation as needed.   Increase activity slowly as tolerated      Discharge instructions      Comments:   Full weight bearing as tolerated left knee; can work on knee  motion. Dry dressing daily left  knee. Can get incision wet in the shower starting 10/19/11. Follow-up in 2 weeks.   Discharge patient         Follow-up Information    Follow up with Kathryne Hitch, MD in 2 weeks.   Contact information:   [facility] [address] [place] [address] [phone]           Signed: Kathryne Hitch 10/17/2011, 6:46 AM

## 2011-10-17 NOTE — Progress Notes (Signed)
Pt to be discharged to SNF for rehab.  Report called to Lawson Fiscal, nurse at [facility], where pt is discharging to.  Pt is to be transported to facility via ambulance.  Discharged instructions reviewed with pt.  No concerns or comments at this time.

## 2011-10-17 NOTE — Care Management Note (Signed)
    Page 1 of 1   10/17/2011     9:16:13 AM   CARE MANAGEMENT NOTE 10/17/2011  Patient:  Kelly [name], Kelly [name]   Account Number:  [id number]  Date Initiated:  10/13/2011  Documentation initiated by:  PEARSON,COOKIE  Subjective/Objective Assessment:   pt admitted for revision total left knee arthroplasty     Action/Plan:   plan to dc to SNF   Anticipated DC Date:  10/17/2011   Anticipated DC Plan:  SKILLED NURSING FACILITY  In-house referral  Clinical Social Worker      DC Planning Services  CM consult      Choice offered to / List presented to:             Status of service:  Completed, signed off Medicare Important Message given?  NA - LOS <3 / Initial given by admissions (If response is "NO", the following Medicare IM given date fields will be blank) Date Medicare IM given:   Date Additional Medicare IM given:    Discharge Disposition:  SKILLED NURSING FACILITY  Per UR Regulation:  Reviewed for med. necessity/level of care/duration of stay  If discussed at Long Length of Stay Meetings, dates discussed:    Comments:  10/13/11 MPearson, RN, BSN Pt plan to dc to SNF.

## 2011-10-18 NOTE — Progress Notes (Signed)
Clinical Social Work Department CLINICAL SOCIAL WORK PLACEMENT NOTE 10/18/2011  Patient:  Kelly [name], Kelly [name]  Account Number:  [id number] Admit date:  10/12/2011  Clinical Social Worker:  Cori Razor, LCSW  Date/time:  10/13/2011 04:01 PM  Clinical Social Work is seeking post-discharge placement for this patient at the following level of care:   SKILLED NURSING   (*CSW will update this form in Epic as items are completed)   10/13/2011  Patient/family provided with [facility] Department of Clinical Social Work's list of facilities offering this level of care within the geographic area requested by the patient (or if unable, by the patient's family).  10/13/2011  Patient/family informed of their freedom to choose among providers that offer the needed level of care, that participate in Medicare, Medicaid or managed care program needed by the patient, have an available bed and are willing to accept the patient.    Patient/family informed of [facility]' ownership interest in [facility], as well as of the fact that they are under no obligation to receive care at this facility.  PASARR submitted to EDS on 10/13/2011 PASARR number received from EDS on 10/13/2011  FL2 transmitted to all facilities in geographic area requested by pt/family on  10/13/2011 FL2 transmitted to all facilities within larger geographic area on   Patient informed that his/her managed care company has contracts with or will negotiate with  certain facilities, including the following:     Patient/family informed of bed offers received:  10/13/2011 Patient chooses bed at [facility], [place] Physician recommends and patient chooses bed at    [facility] [place] on  10/17/2011 Patient to be transferred to facility by [facility]  The following physician request were entered in Epic:   Additional Comments:  Cori Razor LCSW [phone]

## 2011-10-22 DIAGNOSIS — D649 Anemia, unspecified: Secondary | ICD-10-CM | POA: Diagnosis not present

## 2011-10-22 DIAGNOSIS — E119 Type 2 diabetes mellitus without complications: Secondary | ICD-10-CM | POA: Diagnosis not present

## 2011-10-22 DIAGNOSIS — Z96659 Presence of unspecified artificial knee joint: Secondary | ICD-10-CM | POA: Diagnosis not present

## 2011-10-22 DIAGNOSIS — R112 Nausea with vomiting, unspecified: Secondary | ICD-10-CM | POA: Diagnosis not present

## 2011-10-23 ENCOUNTER — Ambulatory Visit: Payer: Medicare Other | Admitting: Internal Medicine

## 2011-10-26 DIAGNOSIS — M25569 Pain in unspecified knee: Secondary | ICD-10-CM | POA: Diagnosis not present

## 2011-10-26 DIAGNOSIS — M25469 Effusion, unspecified knee: Secondary | ICD-10-CM | POA: Diagnosis not present

## 2011-11-02 ENCOUNTER — Other Ambulatory Visit: Payer: Medicare Other | Admitting: Internal Medicine

## 2011-11-02 DIAGNOSIS — R7301 Impaired fasting glucose: Secondary | ICD-10-CM

## 2011-11-02 DIAGNOSIS — Z471 Aftercare following joint replacement surgery: Secondary | ICD-10-CM | POA: Diagnosis not present

## 2011-11-02 DIAGNOSIS — Z96659 Presence of unspecified artificial knee joint: Secondary | ICD-10-CM | POA: Diagnosis not present

## 2011-11-02 DIAGNOSIS — Z79899 Other long term (current) drug therapy: Secondary | ICD-10-CM

## 2011-11-02 DIAGNOSIS — I10 Essential (primary) hypertension: Secondary | ICD-10-CM | POA: Diagnosis not present

## 2011-11-02 DIAGNOSIS — E785 Hyperlipidemia, unspecified: Secondary | ICD-10-CM

## 2011-11-02 DIAGNOSIS — R269 Unspecified abnormalities of gait and mobility: Secondary | ICD-10-CM | POA: Diagnosis not present

## 2011-11-02 DIAGNOSIS — E119 Type 2 diabetes mellitus without complications: Secondary | ICD-10-CM | POA: Diagnosis not present

## 2011-11-02 LAB — LIPID PANEL
HDL: 24 mg/dL — ABNORMAL LOW (ref >39)
LDL Cholesterol: 100 mg/dL — ABNORMAL HIGH (ref 0–99)
Triglycerides: 223 mg/dL — ABNORMAL HIGH (ref <150)
VLDL: 45 mg/dL — ABNORMAL HIGH (ref 0–40)

## 2011-11-02 LAB — HEPATIC FUNCTION PANEL
Albumin: 3.7 g/dL (ref 3.5–5.2)
Alkaline Phosphatase: 117 U/L (ref 39–117)
Indirect Bilirubin: 0.4 mg/dL (ref 0.0–0.9)
Total Bilirubin: 0.5 mg/dL (ref 0.3–1.2)
Total Protein: 6.7 g/dL (ref 6.0–8.3)

## 2011-11-02 LAB — HEMOGLOBIN A1C: Mean Plasma Glucose: 111 mg/dL (ref <117)

## 2011-11-03 DIAGNOSIS — R269 Unspecified abnormalities of gait and mobility: Secondary | ICD-10-CM | POA: Diagnosis not present

## 2011-11-03 DIAGNOSIS — Z96659 Presence of unspecified artificial knee joint: Secondary | ICD-10-CM | POA: Diagnosis not present

## 2011-11-03 DIAGNOSIS — Z471 Aftercare following joint replacement surgery: Secondary | ICD-10-CM | POA: Diagnosis not present

## 2011-11-03 DIAGNOSIS — E119 Type 2 diabetes mellitus without complications: Secondary | ICD-10-CM | POA: Diagnosis not present

## 2011-11-03 DIAGNOSIS — I10 Essential (primary) hypertension: Secondary | ICD-10-CM | POA: Diagnosis not present

## 2011-11-06 ENCOUNTER — Ambulatory Visit (INDEPENDENT_AMBULATORY_CARE_PROVIDER_SITE_OTHER): Payer: Medicare Other | Admitting: Internal Medicine

## 2011-11-06 ENCOUNTER — Encounter: Payer: Self-pay | Admitting: Internal Medicine

## 2011-11-06 VITALS — BP 114/62 | HR 76 | Temp 98.5°F | Ht 60.25 in | Wt 182.0 lb

## 2011-11-06 DIAGNOSIS — E119 Type 2 diabetes mellitus without complications: Secondary | ICD-10-CM

## 2011-11-06 DIAGNOSIS — E785 Hyperlipidemia, unspecified: Secondary | ICD-10-CM

## 2011-11-06 DIAGNOSIS — K5909 Other constipation: Secondary | ICD-10-CM

## 2011-11-06 DIAGNOSIS — F419 Anxiety disorder, unspecified: Secondary | ICD-10-CM

## 2011-11-06 DIAGNOSIS — I10 Essential (primary) hypertension: Secondary | ICD-10-CM

## 2011-11-06 DIAGNOSIS — Z96652 Presence of left artificial knee joint: Secondary | ICD-10-CM | POA: Insufficient documentation

## 2011-11-06 DIAGNOSIS — F32A Depression, unspecified: Secondary | ICD-10-CM | POA: Insufficient documentation

## 2011-11-06 DIAGNOSIS — Z471 Aftercare following joint replacement surgery: Secondary | ICD-10-CM | POA: Diagnosis not present

## 2011-11-06 DIAGNOSIS — Z96659 Presence of unspecified artificial knee joint: Secondary | ICD-10-CM

## 2011-11-06 DIAGNOSIS — F329 Major depressive disorder, single episode, unspecified: Secondary | ICD-10-CM | POA: Insufficient documentation

## 2011-11-06 DIAGNOSIS — K5903 Drug induced constipation: Secondary | ICD-10-CM

## 2011-11-06 DIAGNOSIS — F341 Dysthymic disorder: Secondary | ICD-10-CM

## 2011-11-06 DIAGNOSIS — R269 Unspecified abnormalities of gait and mobility: Secondary | ICD-10-CM | POA: Diagnosis not present

## 2011-11-06 NOTE — Patient Instructions (Addendum)
Continue to look for new employment. May consider vocational rehabilitation as an option. Continue to monitor blood pressure at home. Call if blood pressure becomes elevated. For now stay off of lisinopril and Crestor. Return in 6 months for six-month recheck. For constipation takes Senokot and MiraLAX.

## 2011-11-06 NOTE — Progress Notes (Signed)
  Subjective:    Patient ID: Kelly [name], female    DOB: [birth year], 66 y.o.   MRN: [id number]  HPI 66 year old white female who had recent revision of left knee replacement which was done 5 years ago. Dr. Allie Bossier did the surgery in late August. She then went [facility] for rehabilitation. She has been on Coumadin for a couple of weeks. Home health nurse inquired recently about her being on Crestor and Coumadin but it turns out she's not taking Crestor or any of her medications at the present time except for hydrocodone, Coumadin, and Robaxin. She has stopped Crestor, baby aspirin, and calcium as well as vitamin D. She has also stopped lisinopril. Her blood pressure readings have been very good but reminded her she's not been very physically active. Says she had influenza immunization it [facility] nursing home about a week ago. I think it is entirely possible her blood pressure will elevate once again when she is back doing daily activities as usual. She is a bit depressed today. She recently lost her part-time job prior to her surgery. Says she wants to work and has her daughter's medical bills to pay. Is frustrated with not having full-time employment.    Review of Systems     Objective:   Physical Exam skin is warm and dry. Chest is clear to auscultation. Cardiac exam regular rate and rhythm normal S1 and S2. Neck is supple without JVD thyromegaly or carotid bruits. Extremities without edema.        Assessment & Plan:  History of hypertension-now off lisinopril and blood pressure readings have been acceptable. We'll continue to monitor off lisinopril  Hyperlipidemia-not taking Crestor patient was reminded off of Crestor for now  Recent revision left knee replacement-now on Coumadin therapy and recovering well. Is now back in her home with home health assistance.  Depression over job loss  History of diabetes mellitus diet controlled  Constipation related to pain  medication. Recommend 2 Senokot tablets followed by daily MiraLAX as long as she remains on pain medication.  History of mild renal insufficiency  History of osteoarthritis  History of iron deficiency anemia  Plan: Patient is to return in 6 months for office visit, blood pressure check, fasting lipid panel and hemoglobin A1c. Continue diet and exercise. Expect patient will not remain on Coumadin for more than 2 more weeks. Continue off of Crestor and lisinopril for now. I do think her blood pressure may increase when she becomes more physically active and she may need to go back on lisinopril.

## 2011-11-07 DIAGNOSIS — Z96659 Presence of unspecified artificial knee joint: Secondary | ICD-10-CM | POA: Diagnosis not present

## 2011-11-07 DIAGNOSIS — R269 Unspecified abnormalities of gait and mobility: Secondary | ICD-10-CM | POA: Diagnosis not present

## 2011-11-07 DIAGNOSIS — I10 Essential (primary) hypertension: Secondary | ICD-10-CM | POA: Diagnosis not present

## 2011-11-07 DIAGNOSIS — E119 Type 2 diabetes mellitus without complications: Secondary | ICD-10-CM | POA: Diagnosis not present

## 2011-11-07 DIAGNOSIS — Z471 Aftercare following joint replacement surgery: Secondary | ICD-10-CM | POA: Diagnosis not present

## 2011-11-08 DIAGNOSIS — Z96659 Presence of unspecified artificial knee joint: Secondary | ICD-10-CM | POA: Diagnosis not present

## 2011-11-08 DIAGNOSIS — I10 Essential (primary) hypertension: Secondary | ICD-10-CM | POA: Diagnosis not present

## 2011-11-08 DIAGNOSIS — E119 Type 2 diabetes mellitus without complications: Secondary | ICD-10-CM | POA: Diagnosis not present

## 2011-11-08 DIAGNOSIS — R269 Unspecified abnormalities of gait and mobility: Secondary | ICD-10-CM | POA: Diagnosis not present

## 2011-11-08 DIAGNOSIS — Z471 Aftercare following joint replacement surgery: Secondary | ICD-10-CM | POA: Diagnosis not present

## 2011-11-09 DIAGNOSIS — I10 Essential (primary) hypertension: Secondary | ICD-10-CM | POA: Diagnosis not present

## 2011-11-09 DIAGNOSIS — Z471 Aftercare following joint replacement surgery: Secondary | ICD-10-CM | POA: Diagnosis not present

## 2011-11-09 DIAGNOSIS — Z96659 Presence of unspecified artificial knee joint: Secondary | ICD-10-CM | POA: Diagnosis not present

## 2011-11-09 DIAGNOSIS — E119 Type 2 diabetes mellitus without complications: Secondary | ICD-10-CM | POA: Diagnosis not present

## 2011-11-09 DIAGNOSIS — R269 Unspecified abnormalities of gait and mobility: Secondary | ICD-10-CM | POA: Diagnosis not present

## 2011-11-10 DIAGNOSIS — E119 Type 2 diabetes mellitus without complications: Secondary | ICD-10-CM | POA: Diagnosis not present

## 2011-11-10 DIAGNOSIS — Z471 Aftercare following joint replacement surgery: Secondary | ICD-10-CM | POA: Diagnosis not present

## 2011-11-10 DIAGNOSIS — R269 Unspecified abnormalities of gait and mobility: Secondary | ICD-10-CM | POA: Diagnosis not present

## 2011-11-10 DIAGNOSIS — Z96659 Presence of unspecified artificial knee joint: Secondary | ICD-10-CM | POA: Diagnosis not present

## 2011-11-10 DIAGNOSIS — I10 Essential (primary) hypertension: Secondary | ICD-10-CM | POA: Diagnosis not present

## 2011-11-13 DIAGNOSIS — Z96659 Presence of unspecified artificial knee joint: Secondary | ICD-10-CM | POA: Diagnosis not present

## 2011-11-13 DIAGNOSIS — Z471 Aftercare following joint replacement surgery: Secondary | ICD-10-CM | POA: Diagnosis not present

## 2011-11-13 DIAGNOSIS — E119 Type 2 diabetes mellitus without complications: Secondary | ICD-10-CM | POA: Diagnosis not present

## 2011-11-13 DIAGNOSIS — I10 Essential (primary) hypertension: Secondary | ICD-10-CM | POA: Diagnosis not present

## 2011-11-13 DIAGNOSIS — R269 Unspecified abnormalities of gait and mobility: Secondary | ICD-10-CM | POA: Diagnosis not present

## 2011-11-14 DIAGNOSIS — E119 Type 2 diabetes mellitus without complications: Secondary | ICD-10-CM | POA: Diagnosis not present

## 2011-11-14 DIAGNOSIS — Z96659 Presence of unspecified artificial knee joint: Secondary | ICD-10-CM | POA: Diagnosis not present

## 2011-11-14 DIAGNOSIS — Z471 Aftercare following joint replacement surgery: Secondary | ICD-10-CM | POA: Diagnosis not present

## 2011-11-14 DIAGNOSIS — R269 Unspecified abnormalities of gait and mobility: Secondary | ICD-10-CM | POA: Diagnosis not present

## 2011-11-14 DIAGNOSIS — I10 Essential (primary) hypertension: Secondary | ICD-10-CM | POA: Diagnosis not present

## 2011-11-17 ENCOUNTER — Other Ambulatory Visit: Payer: Self-pay | Admitting: Gastroenterology

## 2011-11-17 DIAGNOSIS — E119 Type 2 diabetes mellitus without complications: Secondary | ICD-10-CM | POA: Diagnosis not present

## 2011-11-17 DIAGNOSIS — I10 Essential (primary) hypertension: Secondary | ICD-10-CM | POA: Diagnosis not present

## 2011-11-17 DIAGNOSIS — R269 Unspecified abnormalities of gait and mobility: Secondary | ICD-10-CM | POA: Diagnosis not present

## 2011-11-17 DIAGNOSIS — Z96659 Presence of unspecified artificial knee joint: Secondary | ICD-10-CM | POA: Diagnosis not present

## 2011-11-17 DIAGNOSIS — Z471 Aftercare following joint replacement surgery: Secondary | ICD-10-CM | POA: Diagnosis not present

## 2011-11-20 ENCOUNTER — Other Ambulatory Visit: Payer: Self-pay

## 2011-11-20 DIAGNOSIS — Z471 Aftercare following joint replacement surgery: Secondary | ICD-10-CM | POA: Diagnosis not present

## 2011-11-20 DIAGNOSIS — Z96659 Presence of unspecified artificial knee joint: Secondary | ICD-10-CM | POA: Diagnosis not present

## 2011-11-20 DIAGNOSIS — R269 Unspecified abnormalities of gait and mobility: Secondary | ICD-10-CM | POA: Diagnosis not present

## 2011-11-20 DIAGNOSIS — I10 Essential (primary) hypertension: Secondary | ICD-10-CM | POA: Diagnosis not present

## 2011-11-20 DIAGNOSIS — E119 Type 2 diabetes mellitus without complications: Secondary | ICD-10-CM | POA: Diagnosis not present

## 2011-11-21 NOTE — Telephone Encounter (Signed)
Patient calls to report her BP is going up. 140/92 today. Thinks she needs to resume her Lisinopril. OK per Dr. Lenord Fellers to restart Lisinopril 20mg  po daily. Refills at pharmacy.

## 2011-11-22 DIAGNOSIS — Z96659 Presence of unspecified artificial knee joint: Secondary | ICD-10-CM | POA: Diagnosis not present

## 2011-11-22 DIAGNOSIS — R269 Unspecified abnormalities of gait and mobility: Secondary | ICD-10-CM | POA: Diagnosis not present

## 2011-11-22 DIAGNOSIS — Z471 Aftercare following joint replacement surgery: Secondary | ICD-10-CM | POA: Diagnosis not present

## 2011-11-22 DIAGNOSIS — E119 Type 2 diabetes mellitus without complications: Secondary | ICD-10-CM | POA: Diagnosis not present

## 2011-11-22 DIAGNOSIS — I10 Essential (primary) hypertension: Secondary | ICD-10-CM | POA: Diagnosis not present

## 2011-11-24 DIAGNOSIS — E119 Type 2 diabetes mellitus without complications: Secondary | ICD-10-CM | POA: Diagnosis not present

## 2011-11-24 DIAGNOSIS — I10 Essential (primary) hypertension: Secondary | ICD-10-CM | POA: Diagnosis not present

## 2011-11-24 DIAGNOSIS — Z471 Aftercare following joint replacement surgery: Secondary | ICD-10-CM | POA: Diagnosis not present

## 2011-11-24 DIAGNOSIS — R269 Unspecified abnormalities of gait and mobility: Secondary | ICD-10-CM | POA: Diagnosis not present

## 2011-11-24 DIAGNOSIS — Z96659 Presence of unspecified artificial knee joint: Secondary | ICD-10-CM | POA: Diagnosis not present

## 2011-11-28 ENCOUNTER — Other Ambulatory Visit: Payer: Self-pay | Admitting: Gastroenterology

## 2011-11-29 DIAGNOSIS — Z471 Aftercare following joint replacement surgery: Secondary | ICD-10-CM | POA: Diagnosis not present

## 2011-11-29 DIAGNOSIS — E119 Type 2 diabetes mellitus without complications: Secondary | ICD-10-CM | POA: Diagnosis not present

## 2011-11-29 DIAGNOSIS — Z96659 Presence of unspecified artificial knee joint: Secondary | ICD-10-CM | POA: Diagnosis not present

## 2011-11-29 DIAGNOSIS — I10 Essential (primary) hypertension: Secondary | ICD-10-CM | POA: Diagnosis not present

## 2011-11-29 DIAGNOSIS — R269 Unspecified abnormalities of gait and mobility: Secondary | ICD-10-CM | POA: Diagnosis not present

## 2011-12-01 DIAGNOSIS — I10 Essential (primary) hypertension: Secondary | ICD-10-CM | POA: Diagnosis not present

## 2011-12-01 DIAGNOSIS — R269 Unspecified abnormalities of gait and mobility: Secondary | ICD-10-CM | POA: Diagnosis not present

## 2011-12-01 DIAGNOSIS — E119 Type 2 diabetes mellitus without complications: Secondary | ICD-10-CM | POA: Diagnosis not present

## 2011-12-01 DIAGNOSIS — Z471 Aftercare following joint replacement surgery: Secondary | ICD-10-CM | POA: Diagnosis not present

## 2011-12-01 DIAGNOSIS — Z96659 Presence of unspecified artificial knee joint: Secondary | ICD-10-CM | POA: Diagnosis not present

## 2011-12-04 DIAGNOSIS — I10 Essential (primary) hypertension: Secondary | ICD-10-CM | POA: Diagnosis not present

## 2011-12-04 DIAGNOSIS — Z471 Aftercare following joint replacement surgery: Secondary | ICD-10-CM | POA: Diagnosis not present

## 2011-12-04 DIAGNOSIS — Z96659 Presence of unspecified artificial knee joint: Secondary | ICD-10-CM | POA: Diagnosis not present

## 2011-12-04 DIAGNOSIS — E119 Type 2 diabetes mellitus without complications: Secondary | ICD-10-CM | POA: Diagnosis not present

## 2011-12-04 DIAGNOSIS — R269 Unspecified abnormalities of gait and mobility: Secondary | ICD-10-CM | POA: Diagnosis not present

## 2011-12-11 DIAGNOSIS — Z96659 Presence of unspecified artificial knee joint: Secondary | ICD-10-CM | POA: Diagnosis not present

## 2011-12-11 DIAGNOSIS — I10 Essential (primary) hypertension: Secondary | ICD-10-CM | POA: Diagnosis not present

## 2011-12-11 DIAGNOSIS — E119 Type 2 diabetes mellitus without complications: Secondary | ICD-10-CM | POA: Diagnosis not present

## 2011-12-11 DIAGNOSIS — Z471 Aftercare following joint replacement surgery: Secondary | ICD-10-CM | POA: Diagnosis not present

## 2011-12-11 DIAGNOSIS — R269 Unspecified abnormalities of gait and mobility: Secondary | ICD-10-CM | POA: Diagnosis not present

## 2011-12-18 DIAGNOSIS — Z96659 Presence of unspecified artificial knee joint: Secondary | ICD-10-CM | POA: Diagnosis not present

## 2011-12-18 DIAGNOSIS — R269 Unspecified abnormalities of gait and mobility: Secondary | ICD-10-CM | POA: Diagnosis not present

## 2011-12-18 DIAGNOSIS — E119 Type 2 diabetes mellitus without complications: Secondary | ICD-10-CM | POA: Diagnosis not present

## 2011-12-18 DIAGNOSIS — Z471 Aftercare following joint replacement surgery: Secondary | ICD-10-CM | POA: Diagnosis not present

## 2011-12-18 DIAGNOSIS — I10 Essential (primary) hypertension: Secondary | ICD-10-CM | POA: Diagnosis not present

## 2011-12-25 DIAGNOSIS — R269 Unspecified abnormalities of gait and mobility: Secondary | ICD-10-CM | POA: Diagnosis not present

## 2011-12-25 DIAGNOSIS — Z96659 Presence of unspecified artificial knee joint: Secondary | ICD-10-CM | POA: Diagnosis not present

## 2011-12-25 DIAGNOSIS — Z471 Aftercare following joint replacement surgery: Secondary | ICD-10-CM | POA: Diagnosis not present

## 2011-12-25 DIAGNOSIS — I10 Essential (primary) hypertension: Secondary | ICD-10-CM | POA: Diagnosis not present

## 2011-12-25 DIAGNOSIS — E119 Type 2 diabetes mellitus without complications: Secondary | ICD-10-CM | POA: Diagnosis not present

## 2012-02-03 DIAGNOSIS — IMO0002 Reserved for concepts with insufficient information to code with codable children: Secondary | ICD-10-CM | POA: Diagnosis not present

## 2012-02-03 DIAGNOSIS — M5137 Other intervertebral disc degeneration, lumbosacral region: Secondary | ICD-10-CM | POA: Diagnosis not present

## 2012-02-03 DIAGNOSIS — M999 Biomechanical lesion, unspecified: Secondary | ICD-10-CM | POA: Diagnosis not present

## 2012-02-03 DIAGNOSIS — M5380 Other specified dorsopathies, site unspecified: Secondary | ICD-10-CM | POA: Diagnosis not present

## 2012-02-06 DIAGNOSIS — M5137 Other intervertebral disc degeneration, lumbosacral region: Secondary | ICD-10-CM | POA: Diagnosis not present

## 2012-02-06 DIAGNOSIS — M999 Biomechanical lesion, unspecified: Secondary | ICD-10-CM | POA: Diagnosis not present

## 2012-02-06 DIAGNOSIS — M47817 Spondylosis without myelopathy or radiculopathy, lumbosacral region: Secondary | ICD-10-CM | POA: Diagnosis not present

## 2012-02-06 DIAGNOSIS — IMO0002 Reserved for concepts with insufficient information to code with codable children: Secondary | ICD-10-CM | POA: Diagnosis not present

## 2012-02-15 DIAGNOSIS — M47817 Spondylosis without myelopathy or radiculopathy, lumbosacral region: Secondary | ICD-10-CM | POA: Diagnosis not present

## 2012-02-15 DIAGNOSIS — M5137 Other intervertebral disc degeneration, lumbosacral region: Secondary | ICD-10-CM | POA: Diagnosis not present

## 2012-02-15 DIAGNOSIS — M999 Biomechanical lesion, unspecified: Secondary | ICD-10-CM | POA: Diagnosis not present

## 2012-02-15 DIAGNOSIS — IMO0002 Reserved for concepts with insufficient information to code with codable children: Secondary | ICD-10-CM | POA: Diagnosis not present

## 2012-02-23 DIAGNOSIS — M5137 Other intervertebral disc degeneration, lumbosacral region: Secondary | ICD-10-CM | POA: Diagnosis not present

## 2012-02-23 DIAGNOSIS — M47817 Spondylosis without myelopathy or radiculopathy, lumbosacral region: Secondary | ICD-10-CM | POA: Diagnosis not present

## 2012-02-23 DIAGNOSIS — IMO0002 Reserved for concepts with insufficient information to code with codable children: Secondary | ICD-10-CM | POA: Diagnosis not present

## 2012-02-23 DIAGNOSIS — M999 Biomechanical lesion, unspecified: Secondary | ICD-10-CM | POA: Diagnosis not present

## 2012-03-15 ENCOUNTER — Other Ambulatory Visit: Payer: Self-pay | Admitting: Internal Medicine

## 2012-03-15 DIAGNOSIS — Z1231 Encounter for screening mammogram for malignant neoplasm of breast: Secondary | ICD-10-CM

## 2012-04-02 DIAGNOSIS — M1710 Unilateral primary osteoarthritis, unspecified knee: Secondary | ICD-10-CM | POA: Diagnosis not present

## 2012-04-02 DIAGNOSIS — M25569 Pain in unspecified knee: Secondary | ICD-10-CM | POA: Diagnosis not present

## 2012-04-11 ENCOUNTER — Ambulatory Visit (HOSPITAL_COMMUNITY)
Admission: RE | Admit: 2012-04-11 | Discharge: 2012-04-11 | Disposition: A | Discharge status: Home / Self Care | Payer: Medicare Other | Source: Ambulatory Visit | Attending: Internal Medicine | Admitting: Internal Medicine

## 2012-04-11 DIAGNOSIS — Z1231 Encounter for screening mammogram for malignant neoplasm of breast: Secondary | ICD-10-CM

## 2012-04-12 ENCOUNTER — Ambulatory Visit (INDEPENDENT_AMBULATORY_CARE_PROVIDER_SITE_OTHER): Payer: Medicare Other | Admitting: Internal Medicine

## 2012-04-12 ENCOUNTER — Telehealth: Payer: Self-pay | Admitting: Internal Medicine

## 2012-04-12 ENCOUNTER — Encounter: Payer: Self-pay | Admitting: Internal Medicine

## 2012-04-12 VITALS — BP 146/100 | HR 76 | Temp 98.5°F | Wt 190.0 lb

## 2012-04-12 DIAGNOSIS — F341 Dysthymic disorder: Secondary | ICD-10-CM

## 2012-04-12 DIAGNOSIS — F32A Depression, unspecified: Secondary | ICD-10-CM

## 2012-04-12 NOTE — Progress Notes (Signed)
  Subjective:    Patient ID: Kelly [name], female    DOB: 1945/05/13, 67 y.o.   MRN: [id number]  HPI 67 year old white female with history of hyperlipidemia, status post revision of prior left knee replacement August 2013, hypertension, type 2 diabetes mellitus. History of anxiety and depression. 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 Has been unable to find work since that time. Her daughter passed away a few years ago and patient agreed to pay her medical bills. These expenses occurred [place] of state and patient evidently signed an agreement to pay those expenses. Says she has about $56,000 left to pay on that debt. She is tearful in the office today. She does have an interview with [employer] next week. Says that she really can't get herself together and has been very depressed. No suicidal ideations. Currently not on any antidepressant therapy. Says she's never taken any antidepressants. Not sleeping well. Doesn't want to file bankruptcy.    Review of Systems     Objective:   Physical Exam Alert and oriented x3. Affect is depressed. She is crying in the office today. Able to articulate her feelings. Feels that she has been subject age discrimination in the job market.        Assessment & Plan:  Anxiety  Depression  Hypertension  Hyperlipidemia  History of type 2 diabetes mellitus  Plan: Begin Lexapro 10 mg daily generic #30 with 1 refill. Xanax 0.5 mg #60 one half to one by mouth twice a day when necessary anxiety with 1 refill. Followup in 6 weeks. TSH checked March 2013 and was within normal limits

## 2012-04-12 NOTE — Patient Instructions (Addendum)
Begin Lexapro 10 mg daily. Take Xanax 0.5 mg one half to one tablet twice daily as needed for anxiety. Return in 6 weeks.

## 2012-05-07 ENCOUNTER — Ambulatory Visit: Payer: Medicare Other | Admitting: Internal Medicine

## 2012-05-27 ENCOUNTER — Other Ambulatory Visit: Payer: Medicare Other | Admitting: Internal Medicine

## 2012-05-27 DIAGNOSIS — E119 Type 2 diabetes mellitus without complications: Secondary | ICD-10-CM

## 2012-05-27 DIAGNOSIS — E785 Hyperlipidemia, unspecified: Secondary | ICD-10-CM

## 2012-05-27 LAB — LIPID PANEL
Cholesterol: 230 mg/dL — ABNORMAL HIGH (ref 0–200)
Total CHOL/HDL Ratio: 7.9 Ratio
Triglycerides: 173 mg/dL — ABNORMAL HIGH (ref <150)
VLDL: 35 mg/dL (ref 0–40)

## 2012-05-27 LAB — HEMOGLOBIN A1C: Mean Plasma Glucose: 117 mg/dL — ABNORMAL HIGH (ref <117)

## 2012-05-28 ENCOUNTER — Ambulatory Visit (INDEPENDENT_AMBULATORY_CARE_PROVIDER_SITE_OTHER): Payer: Medicare Other | Admitting: Internal Medicine

## 2012-05-28 ENCOUNTER — Encounter: Payer: Self-pay | Admitting: Internal Medicine

## 2012-05-28 VITALS — BP 108/72 | HR 80 | Temp 98.3°F | Wt 193.0 lb

## 2012-05-28 DIAGNOSIS — E119 Type 2 diabetes mellitus without complications: Secondary | ICD-10-CM

## 2012-05-28 DIAGNOSIS — I10 Essential (primary) hypertension: Secondary | ICD-10-CM

## 2012-05-28 DIAGNOSIS — E785 Hyperlipidemia, unspecified: Secondary | ICD-10-CM | POA: Diagnosis not present

## 2012-05-28 MED ORDER — LISINOPRIL 20 MG PO TABS: 20.0000 mg | ORAL_TABLET | Freq: Every morning | ORAL | Status: DC

## 2012-05-28 MED ORDER — METHOCARBAMOL 500 MG PO TABS: 500.0000 mg | ORAL_TABLET | Freq: Four times a day (QID) | ORAL | Status: DC

## 2012-05-28 NOTE — Patient Instructions (Addendum)
Continue Lisinopril and Robaxin. Crestor samples provided. Return in 6 months.

## 2012-05-29 LAB — MICROALBUMIN, URINE: Microalb, Ur: 0.55 mg/dL (ref 0.00–1.89)

## 2012-06-05 ENCOUNTER — Other Ambulatory Visit: Payer: Self-pay

## 2012-06-05 MED ORDER — ROSUVASTATIN CALCIUM 10 MG PO TABS: 10.0000 mg | ORAL_TABLET | Freq: Every day | ORAL | Status: DC

## 2012-09-17 DIAGNOSIS — M1710 Unilateral primary osteoarthritis, unspecified knee: Secondary | ICD-10-CM | POA: Diagnosis not present

## 2012-09-17 DIAGNOSIS — M25569 Pain in unspecified knee: Secondary | ICD-10-CM | POA: Diagnosis not present

## 2012-11-14 ENCOUNTER — Encounter: Payer: Self-pay | Admitting: Internal Medicine

## 2012-11-14 NOTE — Progress Notes (Signed)
  Subjective:    Patient ID: Kelly [name], female    DOB: [birth year], 67 y.o.   MRN: [id number]  HPI 67 year old white female in today for followup on type 2 diabetes mellitus, hypertension, hyperlipidemia, anxiety and depression. Is status post left unicompartmental knee replacement. Her left knee has improved considerably. Takes lisinopril for hypertension. I have been providing her with Crestor samples for hyperlipidemia but apparently she is out. Lab work done yesterday shows total cholesterol of 230 with an LDL cholesterol of 166 and triglycerides of 173. Blood pressures under good control on current regimen. Taking Robaxin for musculoskeletal pain.    Review of Systems     Objective:   Physical Exam Neck is supple without JVD thyromegaly or carotid bruits. Chest clear to auscultation. Cardiac exam regular rate and rhythm normal S1 and S2. Extremities without edema. Skin is warm and dry. See diabetic foot exam.        Assessment & Plan:  Type 2 diabetes mellitus control with diet.    A1c 5.7%. Urine for microalbumin is normal  Hypertension-controlled with lisinopril 20 mg daily.  Hyperlipidemia-out of Crestor. Samples provided.  Anxiety depression  History of left unicompartmental knee replacement  Plan: Patient is to return in 6 months for physical examination. Finances are a problem for her.

## 2012-11-22 ENCOUNTER — Other Ambulatory Visit: Payer: Medicare Other | Admitting: Internal Medicine

## 2012-11-22 DIAGNOSIS — Z1329 Encounter for screening for other suspected endocrine disorder: Secondary | ICD-10-CM

## 2012-11-22 DIAGNOSIS — I10 Essential (primary) hypertension: Secondary | ICD-10-CM | POA: Diagnosis not present

## 2012-11-22 DIAGNOSIS — E119 Type 2 diabetes mellitus without complications: Secondary | ICD-10-CM | POA: Diagnosis not present

## 2012-11-22 DIAGNOSIS — Z13228 Encounter for screening for other metabolic disorders: Secondary | ICD-10-CM

## 2012-11-22 DIAGNOSIS — Z130 Encounter for screening for diseases of the blood and blood-forming organs and certain disorders involving the immune mechanism: Secondary | ICD-10-CM

## 2012-11-22 DIAGNOSIS — Z13220 Encounter for screening for lipoid disorders: Secondary | ICD-10-CM

## 2012-11-22 DIAGNOSIS — M810 Age-related osteoporosis without current pathological fracture: Secondary | ICD-10-CM | POA: Diagnosis not present

## 2012-11-22 LAB — LIPID PANEL
LDL Cholesterol: 97 mg/dL (ref 0–99)
Total CHOL/HDL Ratio: 5 Ratio
Triglycerides: 129 mg/dL (ref <150)

## 2012-11-22 LAB — COMPREHENSIVE METABOLIC PANEL
ALT: 13 U/L (ref 0–35)
AST: 18 U/L (ref 0–37)
Albumin: 3.9 g/dL (ref 3.5–5.2)
Alkaline Phosphatase: 103 U/L (ref 39–117)
BUN: 24 mg/dL — ABNORMAL HIGH (ref 6–23)
CO2: 27 mEq/L (ref 19–32)
Calcium: 9.8 mg/dL (ref 8.4–10.5)
Chloride: 104 mEq/L (ref 96–112)
Creat: 1.67 mg/dL — ABNORMAL HIGH (ref 0.50–1.10)
Glucose, Bld: 90 mg/dL (ref 70–99)
Potassium: 4.5 mEq/L (ref 3.5–5.3)
Sodium: 139 mEq/L (ref 135–145)
Total Bilirubin: 0.6 mg/dL (ref 0.3–1.2)
Total Protein: 6.8 g/dL (ref 6.0–8.3)

## 2012-11-22 LAB — CBC WITH DIFFERENTIAL/PLATELET
Basophils Absolute: 0 10*3/uL (ref 0.0–0.1)
HCT: 38.2 % (ref 36.0–46.0)
Hemoglobin: 13.1 g/dL (ref 12.0–15.0)
Lymphocytes Relative: 38 % (ref 12–46)
MCV: 86.6 fL (ref 78.0–100.0)
Monocytes Absolute: 0.4 10*3/uL (ref 0.1–1.0)
Monocytes Relative: 8 % (ref 3–12)
Neutro Abs: 2.6 10*3/uL (ref 1.7–7.7)
WBC: 5 10*3/uL (ref 4.0–10.5)

## 2012-11-22 LAB — TSH: TSH: 1.966 u[IU]/mL (ref 0.350–4.500)

## 2012-11-22 LAB — HEMOGLOBIN A1C
Hgb A1c MFr Bld: 5.9 % — ABNORMAL HIGH (ref <5.7)
Mean Plasma Glucose: 123 mg/dL — ABNORMAL HIGH (ref <117)

## 2012-11-23 LAB — VITAMIN D 25 HYDROXY (VIT D DEFICIENCY, FRACTURES): Vit D, 25-Hydroxy: 65 ng/mL (ref 30–89)

## 2012-11-25 ENCOUNTER — Other Ambulatory Visit (HOSPITAL_COMMUNITY)
Admission: RE | Admit: 2012-11-25 | Discharge: 2012-11-25 | Disposition: A | Discharge status: Home / Self Care | Payer: Medicare Other | Source: Ambulatory Visit | Attending: Internal Medicine | Admitting: Internal Medicine

## 2012-11-25 ENCOUNTER — Ambulatory Visit (INDEPENDENT_AMBULATORY_CARE_PROVIDER_SITE_OTHER): Payer: Medicare Other | Admitting: Internal Medicine

## 2012-11-25 ENCOUNTER — Encounter: Payer: Self-pay | Admitting: Internal Medicine

## 2012-11-25 VITALS — BP 132/80 | HR 68 | Temp 97.8°F | Ht 61.25 in | Wt 195.0 lb

## 2012-11-25 DIAGNOSIS — Z0000 Encounter for general adult medical examination without abnormal findings: Secondary | ICD-10-CM | POA: Diagnosis not present

## 2012-11-25 DIAGNOSIS — Z23 Encounter for immunization: Secondary | ICD-10-CM | POA: Diagnosis not present

## 2012-11-25 DIAGNOSIS — E559 Vitamin D deficiency, unspecified: Secondary | ICD-10-CM | POA: Diagnosis not present

## 2012-11-25 DIAGNOSIS — E785 Hyperlipidemia, unspecified: Secondary | ICD-10-CM | POA: Diagnosis not present

## 2012-11-25 DIAGNOSIS — I10 Essential (primary) hypertension: Secondary | ICD-10-CM | POA: Diagnosis not present

## 2012-11-25 DIAGNOSIS — Z124 Encounter for screening for malignant neoplasm of cervix: Secondary | ICD-10-CM | POA: Diagnosis not present

## 2012-11-25 DIAGNOSIS — E119 Type 2 diabetes mellitus without complications: Secondary | ICD-10-CM

## 2012-11-25 LAB — POCT URINALYSIS DIPSTICK
Glucose, UA: NEGATIVE
Nitrite, UA: NEGATIVE
Spec Grav, UA: 1.02
Urobilinogen, UA: NEGATIVE
pH, UA: 6

## 2012-11-25 NOTE — Progress Notes (Signed)
Subjective:    Patient ID: Kelly [name], female    DOB: [birth year], 67 y.o.   MRN: [id number]  HPI 67 year old White female for health maintenance and evaluation of medical problems. Says she is to have  Right knee replacement by Dr. Magnus Ivan after the first of the year. History of hyperlipidemia, hypertension, controlled type 2 diabetes mellitus. Crestor samples given today.   History of iron deficiency anemia which resolved when she stopped taking nonsteroidal anti-inflammatory medication. She had colonoscopy 04/22/2010. She is on lisinopril, Crestor, baby aspirin, hydrocodone for osteoarthritis pain. History of chronic kidney disease stage I.   Creatinine runs around 1.5.  Patient has had lots of plastic surgery 2002 in 2003 including of abdominoplasty, thigh liposuction, mastopexy ( breast reduction), brachioplasty's. History of release of A1 pulley right thumb for stenosing  tenosynovitis by Dr. Merlyn Lot December 2011. History of carpal tunnel syndrome right hand. History of face lift with laser peel 2010. Total left knee replacement and right knee arthroscopy 2007. Cholecystectomy 1965. Hysterectomy without oophorectomy at age 54.  Social history: Patient's daughter died of metastatic ovarian cancer. Patient was raised in an orphanage. She formerly worked for the [employer] and has had several jobs since. Retired from [employer]. Is divorced. Drinks 2 glasses of wine daily. Does not smoke She was unemployed for a while. 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  Family history: Father died of alcoholism. Mother died of heart disease. One sister with lung cancer still living. 3 other sisters in good health.       Review of Systems     Objective:   Physical Exam  Vitals reviewed. Constitutional: She is oriented to person, place, and time. She appears well-developed and well-nourished. No distress.  HENT:  Head: Normocephalic and atraumatic.   Right Ear: External ear normal.  Left Ear: External ear normal.  Mouth/Throat: Oropharynx is clear and moist.  Eyes: Conjunctivae are normal. Pupils are equal, round, and reactive to light. Right eye exhibits no discharge. Left eye exhibits no discharge. No scleral icterus.  Neck: Neck supple. No JVD present. No thyromegaly present.  Cardiovascular: Normal rate, regular rhythm, normal heart sounds and intact distal pulses.   No murmur heard. Pulmonary/Chest: Effort normal and breath sounds normal. No respiratory distress. She has no wheezes. She has no rales. She exhibits no tenderness.  Breasts normal female status post breast reduction  Abdominal: Soft. Bowel sounds are normal. She exhibits no distension and no mass. There is no tenderness. There is no rebound and no guarding.  Genitourinary:  Status post hysterectomy. Pap taken of vaginal cuff. Bimanual normal. Does not need Pap in the near future just annual bimanual exam  Musculoskeletal: Normal range of motion. She exhibits no edema.  Lymphadenopathy:    She has no cervical adenopathy.  Neurological: She is alert and oriented to person, place, and time. She has normal reflexes. She displays normal reflexes. No cranial nerve deficit. Coordination normal.  Skin: Skin is warm and dry. No rash noted. She is not diaphoretic.  Psychiatric: She has a normal mood and affect. Her behavior is normal. Judgment and thought content normal.          Assessment & Plan:  Hypertension-stable  Controlled type 2 diabetes  Hyperlipidemia-stable on Crestor  History of vitamin D deficiency  Obesity  Osteoarthritis right knee-to have knee replacement in 2015  Plan: RTC in 6 months. Diet exercise and weight loss. Crestor samples given.  Continue same meds. Flu vaccine given.      Subjective:   Patient presents for Medicare Annual/Subsequent preventive examination.   Review Past Medical/Family/Social: see above   Risk Factors  Current  exercise habits: sedentary but does stationary bike 20 minutes daily and gardening Dietary issues discussed: low fat low carb  Cardiac risk factors:HTN, hyperlipidemia, DM  Depression Screen  (Note: if answer to either of the following is "Yes", a more complete depression screening is indicated)   Over the past two weeks, have you felt down, depressed or hopeless? No  Over the past two weeks, have you felt little interest or pleasure in doing things? No Have you lost interest or pleasure in daily life? No Do you often feel hopeless? No Do you cry easily over simple problems? No   Activities of Daily Living  In your present state of health, do you have any difficulty performing the following activities?:   Driving? No  Managing money? No  Feeding yourself? No  Getting from bed to chair? No  Climbing a flight of stairs? No  Preparing food and eating?: No  Bathing or showering? No  Getting dressed: No  Getting to the toilet? No  Using the toilet:No  Moving around from place to place: No  In the past year have you fallen or had a near fall?:No  Are you sexually active? No  Do you have more than one partner? No   Hearing Difficulties: No  Do you often ask people to speak up or repeat themselves? No  Do you experience ringing or noises in your ears? No  Do you have difficulty understanding soft or whispered voices? No  Do you feel that you have a problem with memory? No Do you often misplace items? No    Home Safety:  Do you have a smoke alarm at your residence? Yes Do you have grab bars in the bathroom?yes Do you have throw rugs in your house? yes   Cognitive Testing  Alert? Yes Normal Appearance?Yes  Oriented to person? Yes Place? Yes  Time? Yes  Recall of three objects? Yes  Can perform simple calculations? Yes  Displays appropriate judgment?Yes  Can read the correct time from a watch face?Yes   List the Names of Other Physician/Practitioners you currently use:   See referral list for the physicians patient is currently seeing.   Dr. Allie Bossier- [facility]  Review of Systems: see above   Objective:     General appearance: Appears stated age and mildly obese  Head: Normocephalic, without obvious abnormality, atraumatic  Eyes: conj clear, EOMi PEERLA  Ears: normal TM's and external ear canals both ears  Nose: Nares normal. Septum midline. Mucosa normal. No drainage or sinus tenderness.  Throat: lips, mucosa, and tongue normal; teeth and gums normal  Neck: no adenopathy, no carotid bruit, no JVD, supple, symmetrical, trachea midline and thyroid not enlarged, symmetric, no tenderness/mass/nodules  No CVA tenderness.  Lungs: clear to auscultation bilaterally  Breasts: normal appearance, no masses or tenderness Heart: regular rate and rhythm, S1, S2 normal, no murmur, click, rub or gallop  Abdomen: soft, non-tender; bowel sounds normal; no masses, no organomegaly  Musculoskeletal: ROM normal in all joints, no crepitus, no deformity, Normal muscle strengthen. Back  is symmetric, no curvature. Skin: Skin color, texture, turgor normal. No rashes or lesions  Lymph nodes: Cervical, supraclavicular, and axillary nodes normal.  Neurologic: CN 2 -12 Normal, Normal symmetric reflexes. Normal coordination and gait  Psych: Alert & Oriented  x 3, Mood appear stable.    Assessment:    Annual wellness medicare exam   Plan:    During the course of the visit the patient was educated and counseled about appropriate screening and preventive services including:   Annual mammogram Flu vaccine given Colonoscopy up to date  Status post hysterectomy. Pap of vaginal cuff done today. Does not need repeat Pap smear. Just needs bimanual exam on an annual basis.     Patient Instructions (the written plan) was given to the patient.  Medicare Attestation  I have personally reviewed:  The patient's medical and social history  Their use of alcohol,  tobacco or illicit drugs  Their current medications and supplements  The patient's functional ability including ADLs,fall risks, home safety risks, cognitive, and hearing and visual impairment  Diet and physical activities  Evidence for depression or mood disorders  The patient's weight, height, BMI, and visual acuity have been recorded in the chart. I have made referrals, counseling, and provided education to the patient based on review of the above and I have provided the patient with a written personalized care plan for preventive services.     And

## 2012-11-25 NOTE — Patient Instructions (Signed)
Continue same medications and return in 6 months.  Flu vaccine given today. 

## 2012-11-26 DIAGNOSIS — E785 Hyperlipidemia, unspecified: Secondary | ICD-10-CM | POA: Diagnosis not present

## 2012-11-26 DIAGNOSIS — E119 Type 2 diabetes mellitus without complications: Secondary | ICD-10-CM | POA: Diagnosis not present

## 2012-11-26 DIAGNOSIS — Z23 Encounter for immunization: Secondary | ICD-10-CM | POA: Diagnosis not present

## 2012-11-26 DIAGNOSIS — I10 Essential (primary) hypertension: Secondary | ICD-10-CM | POA: Diagnosis not present

## 2012-11-26 DIAGNOSIS — E559 Vitamin D deficiency, unspecified: Secondary | ICD-10-CM | POA: Diagnosis not present

## 2012-11-26 DIAGNOSIS — Z0000 Encounter for general adult medical examination without abnormal findings: Secondary | ICD-10-CM | POA: Diagnosis not present

## 2013-01-02 ENCOUNTER — Other Ambulatory Visit: Payer: Self-pay | Admitting: Internal Medicine

## 2013-01-11 IMAGING — NM NM BONE 3 PHASE
1 series · 6 of 6 positions shown · non-contrast
Comparison: none

[Series 1: fl flow and static · 4.75mm/px · 6 of 40 frames shown]
[frame 4/40  full-range]
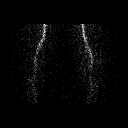
[frame 10/40  full-range]
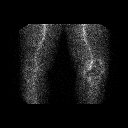
[frame 17/40  full-range]
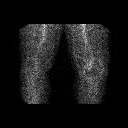
[frame 24/40  full-range]
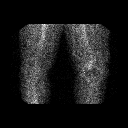
[frame 30/40  full-range]
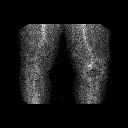
[frame 37/40  full-range]
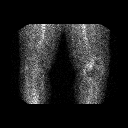

[6 of 6 positions shown; findings below may reference images not displayed]

Canned report from images found in remote index.

Refer to host system for actual result text.

## 2013-03-10 ENCOUNTER — Other Ambulatory Visit: Payer: Self-pay | Admitting: Internal Medicine

## 2013-03-10 DIAGNOSIS — Z1231 Encounter for screening mammogram for malignant neoplasm of breast: Secondary | ICD-10-CM

## 2013-04-14 ENCOUNTER — Other Ambulatory Visit: Payer: Self-pay | Admitting: Internal Medicine

## 2013-04-14 ENCOUNTER — Ambulatory Visit (HOSPITAL_COMMUNITY)
Admission: RE | Admit: 2013-04-14 | Discharge: 2013-04-14 | Disposition: A | Discharge status: Home / Self Care | Payer: Medicare Other | Source: Ambulatory Visit | Attending: Internal Medicine | Admitting: Internal Medicine

## 2013-04-14 DIAGNOSIS — Z1231 Encounter for screening mammogram for malignant neoplasm of breast: Secondary | ICD-10-CM | POA: Insufficient documentation

## 2013-05-26 ENCOUNTER — Other Ambulatory Visit: Payer: Medicare Other | Admitting: Internal Medicine

## 2013-05-27 ENCOUNTER — Ambulatory Visit: Payer: Medicare Other | Admitting: Internal Medicine

## 2013-06-10 ENCOUNTER — Other Ambulatory Visit: Payer: Medicare Other | Admitting: Internal Medicine

## 2013-06-10 DIAGNOSIS — I10 Essential (primary) hypertension: Secondary | ICD-10-CM | POA: Diagnosis not present

## 2013-06-10 DIAGNOSIS — E785 Hyperlipidemia, unspecified: Secondary | ICD-10-CM | POA: Diagnosis not present

## 2013-06-10 DIAGNOSIS — E119 Type 2 diabetes mellitus without complications: Secondary | ICD-10-CM

## 2013-06-10 LAB — HEMOGLOBIN A1C
Hgb A1c MFr Bld: 5.9 % — ABNORMAL HIGH (ref <5.7)
MEAN PLASMA GLUCOSE: 123 mg/dL — AB (ref <117)

## 2013-06-10 LAB — LIPID PANEL
CHOL/HDL RATIO: 6 ratio
CHOLESTEROL: 209 mg/dL — AB (ref 0–200)
HDL: 35 mg/dL — ABNORMAL LOW (ref >39)
LDL Cholesterol: 144 mg/dL — ABNORMAL HIGH (ref 0–99)
TRIGLYCERIDES: 148 mg/dL (ref <150)
VLDL: 30 mg/dL (ref 0–40)

## 2013-06-10 LAB — HEPATIC FUNCTION PANEL
ALT: 15 U/L (ref 0–35)
AST: 19 U/L (ref 0–37)
Albumin: 4 g/dL (ref 3.5–5.2)
Alkaline Phosphatase: 106 U/L (ref 39–117)
Bilirubin, Direct: 0.1 mg/dL (ref 0.0–0.3)
Indirect Bilirubin: 0.5 mg/dL (ref 0.2–1.2)
Total Bilirubin: 0.6 mg/dL (ref 0.2–1.2)
Total Protein: 6.5 g/dL (ref 6.0–8.3)

## 2013-06-13 ENCOUNTER — Ambulatory Visit: Payer: Medicare Other | Admitting: Internal Medicine

## 2013-06-16 ENCOUNTER — Encounter: Payer: Self-pay | Admitting: Internal Medicine

## 2013-06-16 ENCOUNTER — Ambulatory Visit (INDEPENDENT_AMBULATORY_CARE_PROVIDER_SITE_OTHER): Payer: Medicare Other | Admitting: Internal Medicine

## 2013-06-16 VITALS — BP 122/78 | HR 76 | Temp 98.5°F | Wt 196.0 lb

## 2013-06-16 DIAGNOSIS — I10 Essential (primary) hypertension: Secondary | ICD-10-CM

## 2013-06-16 DIAGNOSIS — M1710 Unilateral primary osteoarthritis, unspecified knee: Secondary | ICD-10-CM

## 2013-06-16 DIAGNOSIS — IMO0002 Reserved for concepts with insufficient information to code with codable children: Secondary | ICD-10-CM | POA: Diagnosis not present

## 2013-06-16 DIAGNOSIS — M1712 Unilateral primary osteoarthritis, left knee: Secondary | ICD-10-CM

## 2013-06-16 DIAGNOSIS — E119 Type 2 diabetes mellitus without complications: Secondary | ICD-10-CM

## 2013-06-16 DIAGNOSIS — E785 Hyperlipidemia, unspecified: Secondary | ICD-10-CM | POA: Diagnosis not present

## 2013-06-17 NOTE — Progress Notes (Signed)
   Subjective:    Patient ID: Kelly [name], female    DOB: [birth year], 68 y.o.   MRN: [id number]  HPI 68 year old Female with history of hypertension and hyperlipidemia in today for six-month recheck. 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 She's working out of [employer]. She continues to be active in [place]. Unfortunately has run out of Crestor. She was scheduled to have knee replacement earlier this year but she found full-time employment and put that off and is doing okay with the knee. No complaints or problems. Her blood pressures under excellent control. Good control of diabetes. Course lipid panel is elevated because she's not been taking Crestor. I usually supply her with samples. Have asked her to call me if she runs out of given her more samples today.    Review of Systems     Objective:   Physical Exam Skin warm and dry. Nodes none. Neck supple without JVD thyromegaly or carotid bruits. Chest clear. Cardiac exam regular rate and rhythm normal S1 and S2. Extremities without edema.        Assessment & Plan:  Hypertension-stable on current regimen  Hyperlipidemia-LDL and total cholesterol elevated because she was out of Crestor  Controlled type 2 diabetes mellitus-hemoglobin A1c stable at 5.9%  Osteoarthritis left knee-has deferred knee replacement for now.  Plan: Return in 6 months for physical examination and fasting lab work. Reminded about annual diabetic eye exam.

## 2013-06-21 NOTE — Patient Instructions (Signed)
Take medications as directed. Return in 6 months for physical examination. Crestor samples provided. Call me if you run out of these.

## 2013-07-04 ENCOUNTER — Other Ambulatory Visit: Payer: Self-pay | Admitting: Internal Medicine

## 2013-07-15 ENCOUNTER — Other Ambulatory Visit: Payer: Self-pay | Admitting: Internal Medicine

## 2013-07-22 ENCOUNTER — Telehealth: Payer: Self-pay | Admitting: Internal Medicine

## 2013-07-22 NOTE — Telephone Encounter (Signed)
Pt called has been taking Crestor 10 mg tablet 1 daily from the samples given.  However [employer] has sent her a letter and they will not cover Crestor, needs to be a generic.  Pt would like to know if she can continue to get Crestor samples, or if not, please send in a new script to [address].  Best number to call pt is [phone]

## 2013-07-22 NOTE — Telephone Encounter (Signed)
Patient informed. 

## 2013-07-22 NOTE — Telephone Encounter (Signed)
We have samples for now. Can pick up.

## 2013-07-22 NOTE — Telephone Encounter (Signed)
Patient informed. Will pick up this week.

## 2013-09-23 DIAGNOSIS — M1710 Unilateral primary osteoarthritis, unspecified knee: Secondary | ICD-10-CM | POA: Diagnosis not present

## 2013-09-23 DIAGNOSIS — M25569 Pain in unspecified knee: Secondary | ICD-10-CM | POA: Diagnosis not present

## 2013-10-01 ENCOUNTER — Encounter: Payer: Self-pay | Admitting: Internal Medicine

## 2013-10-09 ENCOUNTER — Other Ambulatory Visit: Payer: Self-pay | Admitting: Internal Medicine

## 2013-11-19 DIAGNOSIS — M65322 Trigger finger, left index finger: Secondary | ICD-10-CM | POA: Diagnosis not present

## 2013-12-09 ENCOUNTER — Encounter: Payer: Self-pay | Admitting: Internal Medicine

## 2013-12-16 ENCOUNTER — Other Ambulatory Visit: Payer: Self-pay | Admitting: Internal Medicine

## 2013-12-16 ENCOUNTER — Other Ambulatory Visit: Payer: Medicare Other | Admitting: Internal Medicine

## 2013-12-16 DIAGNOSIS — E785 Hyperlipidemia, unspecified: Secondary | ICD-10-CM | POA: Diagnosis not present

## 2013-12-16 DIAGNOSIS — Z0000 Encounter for general adult medical examination without abnormal findings: Secondary | ICD-10-CM

## 2013-12-16 DIAGNOSIS — I10 Essential (primary) hypertension: Secondary | ICD-10-CM

## 2013-12-16 DIAGNOSIS — N289 Disorder of kidney and ureter, unspecified: Secondary | ICD-10-CM

## 2013-12-16 DIAGNOSIS — E119 Type 2 diabetes mellitus without complications: Secondary | ICD-10-CM

## 2013-12-16 DIAGNOSIS — D649 Anemia, unspecified: Secondary | ICD-10-CM

## 2013-12-16 LAB — CBC WITH DIFFERENTIAL/PLATELET
BASOS PCT: 0 % (ref 0–1)
Basophils Absolute: 0 10*3/uL (ref 0.0–0.1)
EOS ABS: 0.1 10*3/uL (ref 0.0–0.7)
Eosinophils Relative: 3 % (ref 0–5)
HEMATOCRIT: 32.8 % — AB (ref 36.0–46.0)
Hemoglobin: 10.8 g/dL — ABNORMAL LOW (ref 12.0–15.0)
Lymphocytes Relative: 29 % (ref 12–46)
Lymphs Abs: 1.2 10*3/uL (ref 0.7–4.0)
MCH: 29 pg (ref 26.0–34.0)
MCHC: 32.9 g/dL (ref 30.0–36.0)
MCV: 88.2 fL (ref 78.0–100.0)
MONO ABS: 0.3 10*3/uL (ref 0.1–1.0)
Monocytes Relative: 6 % (ref 3–12)
NEUTROS ABS: 2.7 10*3/uL (ref 1.7–7.7)
Neutrophils Relative %: 62 % (ref 43–77)
Platelets: 217 10*3/uL (ref 150–400)
RBC: 3.72 MIL/uL — ABNORMAL LOW (ref 3.87–5.11)
RDW: 15.3 % (ref 11.5–15.5)
WBC: 4.3 10*3/uL (ref 4.0–10.5)

## 2013-12-16 LAB — LIPID PANEL
CHOL/HDL RATIO: 4.5 ratio
Cholesterol: 153 mg/dL (ref 0–200)
HDL: 34 mg/dL — AB (ref >39)
LDL CALC: 85 mg/dL (ref 0–99)
TRIGLYCERIDES: 168 mg/dL — AB (ref <150)
VLDL: 34 mg/dL (ref 0–40)

## 2013-12-16 LAB — COMPREHENSIVE METABOLIC PANEL
ALBUMIN: 3.9 g/dL (ref 3.5–5.2)
ALK PHOS: 96 U/L (ref 39–117)
ALT: 12 U/L (ref 0–35)
AST: 18 U/L (ref 0–37)
BILIRUBIN TOTAL: 0.4 mg/dL (ref 0.2–1.2)
BUN: 22 mg/dL (ref 6–23)
CO2: 21 mEq/L (ref 19–32)
Calcium: 9.4 mg/dL (ref 8.4–10.5)
Chloride: 107 mEq/L (ref 96–112)
Creat: 1.42 mg/dL — ABNORMAL HIGH (ref 0.50–1.10)
Glucose, Bld: 86 mg/dL (ref 70–99)
POTASSIUM: 3.9 meq/L (ref 3.5–5.3)
Sodium: 138 mEq/L (ref 135–145)
Total Protein: 6.5 g/dL (ref 6.0–8.3)

## 2013-12-16 LAB — HEMOGLOBIN A1C
Hgb A1c MFr Bld: 5.6 % (ref <5.7)
Mean Plasma Glucose: 114 mg/dL (ref <117)

## 2013-12-16 LAB — TSH: TSH: 1.5 u[IU]/mL (ref 0.350–4.500)

## 2013-12-16 NOTE — Addendum Note (Signed)
Addended by: Amado Coe on: 12/16/2013 09:39 AM   Modules accepted: Orders

## 2013-12-17 LAB — MICROALBUMIN, URINE: MICROALB UR: 0.7 mg/dL (ref <2.0)

## 2013-12-18 ENCOUNTER — Ambulatory Visit (INDEPENDENT_AMBULATORY_CARE_PROVIDER_SITE_OTHER): Payer: Medicare Other | Admitting: Internal Medicine

## 2013-12-18 ENCOUNTER — Encounter: Payer: Self-pay | Admitting: Internal Medicine

## 2013-12-18 VITALS — BP 110/68 | HR 75 | Temp 97.9°F | Ht 61.0 in | Wt 193.0 lb

## 2013-12-18 DIAGNOSIS — R319 Hematuria, unspecified: Secondary | ICD-10-CM | POA: Diagnosis not present

## 2013-12-18 DIAGNOSIS — D509 Iron deficiency anemia, unspecified: Secondary | ICD-10-CM

## 2013-12-18 DIAGNOSIS — Z0000 Encounter for general adult medical examination without abnormal findings: Secondary | ICD-10-CM

## 2013-12-18 DIAGNOSIS — I10 Essential (primary) hypertension: Secondary | ICD-10-CM

## 2013-12-18 DIAGNOSIS — R7989 Other specified abnormal findings of blood chemistry: Secondary | ICD-10-CM | POA: Diagnosis not present

## 2013-12-18 DIAGNOSIS — R748 Abnormal levels of other serum enzymes: Secondary | ICD-10-CM | POA: Diagnosis not present

## 2013-12-18 DIAGNOSIS — E119 Type 2 diabetes mellitus without complications: Secondary | ICD-10-CM | POA: Diagnosis not present

## 2013-12-18 DIAGNOSIS — E785 Hyperlipidemia, unspecified: Secondary | ICD-10-CM | POA: Diagnosis not present

## 2013-12-18 LAB — POCT URINALYSIS DIPSTICK
BILIRUBIN UA: NEGATIVE
Glucose, UA: NEGATIVE
KETONES UA: NEGATIVE
LEUKOCYTES UA: NEGATIVE
Nitrite, UA: NEGATIVE
PROTEIN UA: NEGATIVE
Spec Grav, UA: <=1.005
Urobilinogen, UA: NEGATIVE
pH, UA: 5

## 2013-12-18 LAB — IRON AND TIBC
%SAT: 12 % — ABNORMAL LOW (ref 20–55)
Iron: 39 ug/dL — ABNORMAL LOW (ref 42–145)
TIBC: 325 ug/dL (ref 250–470)
UIBC: 286 ug/dL (ref 125–400)

## 2013-12-18 NOTE — Patient Instructions (Addendum)
Repeat B-met today.  Anemia studies to be done. Stop lisinopril and return in December for follow-up. Take iron supplement. Urine culture pending because of abnormal urine dipstick.

## 2013-12-19 LAB — BASIC METABOLIC PANEL
BUN: 22 mg/dL (ref 6–23)
CHLORIDE: 109 meq/L (ref 96–112)
CO2: 25 meq/L (ref 19–32)
CREATININE: 1.49 mg/dL — AB (ref 0.50–1.10)
Calcium: 9.1 mg/dL (ref 8.4–10.5)
GLUCOSE: 90 mg/dL (ref 70–99)
Potassium: 3.9 mEq/L (ref 3.5–5.3)
SODIUM: 139 meq/L (ref 135–145)

## 2013-12-19 LAB — URINALYSIS, MICROSCOPIC ONLY
BACTERIA UA: NONE SEEN
Casts: NONE SEEN
Crystals: NONE SEEN

## 2013-12-19 LAB — CREATININE WITH EST GFR
Creat: 1.57 mg/dL — ABNORMAL HIGH (ref 0.50–1.10)
GFR, EST AFRICAN AMERICAN: 39 mL/min — AB
GFR, EST NON AFRICAN AMERICAN: 33 mL/min — AB

## 2013-12-20 LAB — URINE CULTURE
COLONY COUNT: NO GROWTH
Organism ID, Bacteria: NO GROWTH

## 2013-12-22 ENCOUNTER — Telehealth: Payer: Self-pay

## 2013-12-22 NOTE — Telephone Encounter (Signed)
B12 added.  Patient informed to stop Lisinopril.  Appt schedule for 12/25/13 at 12.  Patient aware.

## 2013-12-22 NOTE — Telephone Encounter (Signed)
-----  Message from Elby Showers, MD sent at 12/22/2013  4:26 PM EST ----- Pt is slightly anemic. Need to add Fe/TIBC to labs as well as B12 level. Dx is anemia. Needs to stop Lisinopril and return to office Friday for follow up. Creatinine is elevated and we will likely need another med plus need to repeat B-met off Lisinopril.

## 2013-12-23 LAB — VITAMIN B12: VITAMIN B 12: 333 pg/mL (ref 211–911)

## 2013-12-25 ENCOUNTER — Encounter: Payer: Self-pay | Admitting: Internal Medicine

## 2013-12-25 ENCOUNTER — Ambulatory Visit (INDEPENDENT_AMBULATORY_CARE_PROVIDER_SITE_OTHER): Payer: Medicare Other | Admitting: Internal Medicine

## 2013-12-25 VITALS — BP 118/76 | HR 73 | Temp 97.8°F | Ht 61.0 in | Wt 193.0 lb

## 2013-12-25 DIAGNOSIS — D509 Iron deficiency anemia, unspecified: Secondary | ICD-10-CM | POA: Diagnosis not present

## 2013-12-25 DIAGNOSIS — R7989 Other specified abnormal findings of blood chemistry: Secondary | ICD-10-CM

## 2013-12-25 DIAGNOSIS — E785 Hyperlipidemia, unspecified: Secondary | ICD-10-CM | POA: Diagnosis not present

## 2013-12-25 DIAGNOSIS — I10 Essential (primary) hypertension: Secondary | ICD-10-CM | POA: Diagnosis not present

## 2013-12-25 DIAGNOSIS — E119 Type 2 diabetes mellitus without complications: Secondary | ICD-10-CM

## 2013-12-25 DIAGNOSIS — N189 Chronic kidney disease, unspecified: Secondary | ICD-10-CM

## 2013-12-25 DIAGNOSIS — R197 Diarrhea, unspecified: Secondary | ICD-10-CM

## 2013-12-25 DIAGNOSIS — A084 Viral intestinal infection, unspecified: Secondary | ICD-10-CM

## 2013-12-25 DIAGNOSIS — R748 Abnormal levels of other serum enzymes: Secondary | ICD-10-CM

## 2013-12-25 LAB — HEMOCCULT GUIAC POC 1CARD (OFFICE)
Card #2 Fecal Occult Blod, POC: POSITIVE
Card #3 Fecal Occult Blood, POC: POSITIVE
FECAL OCCULT BLD: POSITIVE

## 2013-12-25 NOTE — Progress Notes (Signed)
   Subjective:    Patient ID: Kelly [name], female    DOB: 01/29/1946, 68 y.o.   MRN: [id number]  HPI After last visit, lisinopril was stopped due to elevated serum creatinine. Creatinine was 1.57 and previously had been in the 1.4 range. She is here today for follow-up of chronic kidney disease. However, she's had recent bout of diarrhea. Probably has volume depletion and it would be wise not to repeat basic metabolic panel today. Would go to defer that for a month. She was also found to be iron deficient at physical exam at last visit and was started back on iron supplementation.    Review of Systems     Objective:   Physical Exam  Spent 15 minutes speaking with patient. Abdominal exam is unremarkable      Assessment & Plan:  Diarrhea-likely viral. Recommend clear liquids and advance diet slowly  Elevated serum creatinine/chronic kidney disease  Hypertension-stable off lisinopril  Controlled type 2 diabetes mellitus-stable with diet  Hyperlipidemia-treated with Crestor  Iron deficiency anemia-has started iron supplementation  Plan: Patient is to return for repeat basic metabolic panel and approximate 4 weeks. Appointment to follow-up with me in February. Consider nephrology consultation.

## 2014-01-01 ENCOUNTER — Other Ambulatory Visit (INDEPENDENT_AMBULATORY_CARE_PROVIDER_SITE_OTHER): Payer: Medicare Other | Admitting: Internal Medicine

## 2014-01-01 DIAGNOSIS — Z1211 Encounter for screening for malignant neoplasm of colon: Secondary | ICD-10-CM | POA: Diagnosis not present

## 2014-01-01 LAB — HEMOCCULT GUIAC POC 1CARD (OFFICE)
Card #3 Fecal Occult Blood, POC: NEGATIVE
FECAL OCCULT BLD: NEGATIVE
FECAL OCCULT BLD: NEGATIVE

## 2014-01-20 ENCOUNTER — Other Ambulatory Visit: Payer: Medicare Other | Admitting: Internal Medicine

## 2014-01-20 DIAGNOSIS — I10 Essential (primary) hypertension: Secondary | ICD-10-CM

## 2014-01-20 DIAGNOSIS — N289 Disorder of kidney and ureter, unspecified: Secondary | ICD-10-CM

## 2014-01-20 LAB — BASIC METABOLIC PANEL
BUN: 19 mg/dL (ref 6–23)
CALCIUM: 9.7 mg/dL (ref 8.4–10.5)
CO2: 24 mEq/L (ref 19–32)
CREATININE: 1.35 mg/dL — AB (ref 0.50–1.10)
Chloride: 106 mEq/L (ref 96–112)
GLUCOSE: 83 mg/dL (ref 70–99)
Potassium: 4.8 mEq/L (ref 3.5–5.3)
Sodium: 140 mEq/L (ref 135–145)

## 2014-01-27 ENCOUNTER — Telehealth: Payer: Self-pay

## 2014-01-27 NOTE — Telephone Encounter (Signed)
-----   Message from Elby Showers, MD sent at 01/27/2014  9:09 AM EST ----- Creatinine has improved off Lisinopril. How is her blood pressure doing off this med? Has appt in February. Bring in sooner if BP is not doing well off Lisinopril.

## 2014-01-27 NOTE — Telephone Encounter (Signed)
Left message informing patient of lab results.  Advised her that if she is having blood pressure issues to call and make appointment.  Otherwise follow up in February.

## 2014-02-12 DIAGNOSIS — M1711 Unilateral primary osteoarthritis, right knee: Secondary | ICD-10-CM | POA: Diagnosis not present

## 2014-02-17 DIAGNOSIS — H2513 Age-related nuclear cataract, bilateral: Secondary | ICD-10-CM | POA: Diagnosis not present

## 2014-03-17 DIAGNOSIS — L821 Other seborrheic keratosis: Secondary | ICD-10-CM | POA: Diagnosis not present

## 2014-03-17 DIAGNOSIS — L814 Other melanin hyperpigmentation: Secondary | ICD-10-CM | POA: Diagnosis not present

## 2014-03-17 DIAGNOSIS — D225 Melanocytic nevi of trunk: Secondary | ICD-10-CM | POA: Diagnosis not present

## 2014-03-22 NOTE — Patient Instructions (Addendum)
Continue off lisinopril. Return in early December for repeat basic metabolic panel. See me again in February. Monitor blood pressure at home. Clear liquids for diarrhea and advance diet slowly.

## 2014-03-22 NOTE — Progress Notes (Addendum)
Subjective:    Patient ID: Kelly [name], female    DOB: 19-Jul-1945, 69 y.o.   MRN: [id number]  HPI  Pleasant 69 year old White Female in today for health maintenance and evaluation of medical issues. Had flu vaccine in October. History of right knee replacement by Dr. Ninfa Linden . History of hyperlipidemia, hypertension, controlled type 2 diabetes mellitus. Has difficulty affording Crestor so samples given today. History by deficiency anemia which resolved after stopping nonsteroidal anti-inflammatory medication. She had colonoscopy 04/22/2010 which was negative.  History of chronic kidney disease. Creatinine runs around 1.5.  Patient has had lots of plastic surgery in 2002 and 2003 including an abdominoplasty, thigh liposuction, mastopexy (breast reduction), brachioplasties. 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 Ardis Hughs 2011. History of right carpal tunnel syndrome. History of face lift with laser peel 2010. Total left knee replacement and right knee arthroscopy 2007. Cholecystectomy 1965. Hysterectomy without oophorectomy at age 40.  Social history: Patient's daughter died of metastatic ovarian cancer. Patient was left with a lot of bills to pay for her daughter. Patient was raised in an orphanage in [place]. She formerly worked for the [employer] and has had several jobs since that time. She is retired from the [employer]. Now working as a Land. She is divorced. Drinks 2 glasses of wine daily. She does not smoke.  Family history: Father died of alcoholism. Mother died of heart disease. One sister with history of lung cancer still living. 3 other sisters in good health.    Review of Systems     Objective:   Physical Exam  Constitutional: She is oriented to person, place, and time. She appears well-developed and well-nourished. No distress.  HENT:  Head: Normocephalic and atraumatic.  Right Ear: External ear normal.    Left Ear: External ear normal.  Mouth/Throat: Oropharynx is clear and moist. No oropharyngeal exudate.  Eyes: Conjunctivae and EOM are normal. Pupils are equal, round, and reactive to light. Right eye exhibits no discharge. Left eye exhibits no discharge. No scleral icterus.  Neck: Neck supple. No JVD present. No thyromegaly present.  Cardiovascular: Normal rate, regular rhythm, normal heart sounds and intact distal pulses.   No murmur heard. Pulmonary/Chest: Effort normal and breath sounds normal. No respiratory distress. She has no wheezes. She has no rales. She exhibits no tenderness.  Breasts normal female  Abdominal: Soft. Bowel sounds are normal. She exhibits no distension and no mass. There is no tenderness. There is no rebound and no guarding.  Genitourinary:  Bimanual normal. Pap taken 2014. Status post hysterectomy  Lymphadenopathy:    She has no cervical adenopathy.  Neurological: She is alert and oriented to person, place, and time. She has normal reflexes. No cranial nerve deficit.  Skin: Skin is warm and dry. No rash noted. She is not diaphoretic.  Psychiatric: She has a normal mood and affect. Her behavior is normal. Judgment and thought content normal.  Vitals reviewed.         Assessment & Plan:  Essential hypertension  Hyperlipidemia-treated with Crestor -- samples provided  Controlled type 2 diabetes mellitus-stable and well controlled  Remote history of iron deficiency anemia related to NSAIDS-now anemic once again. Has iron deficiency. Recommend iron supplement.  Chronic kidney disease-consider nephrology consult. Likely related to NSAID use, diabetes, hypertension  Plan: Stop ACE inhibitor and follow-up next week.  Subjective:   Patient presents for Medicare Annual/Subsequent preventive examination.   Review Past Medical/Family/Social: See above  Risk Factors  Current exercise habits: Sedentary Dietary issues discussed: Low fat low car  Cardiac  risk factors: Obesity, diabetes, hyperlipidemia  Depression Screen  (Note: if answer to either of the following is "Yes", a more complete depression screening is indicated)  Over the past two weeks, have you felt down, depressed or hopeless? no Over the past two weeks, have you felt little interest or pleasure in doing things? no Have you lost interest or pleasure in daily life? Yes  Do you often feel hopeless? no Do you cry easily over simple problems? No   Activities of Daily Living  In your present state of health, do you have any difficulty performing the following activities?:  Driving? No  Managing money? No  Feeding yourself? No  Getting from bed to chair? No  Climbing a flight of stairs? No  Preparing food and eating?: No  Bathing or showering? No  Getting dressed: No  Getting to the toilet? No  Using the toilet:No  Moving around from place to place: No  In the past year have you fallen or had a near fall?:No  Are you sexually active? yes Do you have more than one partner? No   Hearing Difficulties: No  Do you often ask people to speak up or repeat themselves? No  Do you experience ringing or noises in your ears? no Do you have difficulty understanding soft or whispered voices? No  Do you feel that you have a problem with memory? no  Do you often misplace items? No  Do you feel safe at home? Yes   Cognitive Testing  Alert? Yes Normal Appearance?Yes  Oriented to person? Yes Place? Yes  Time? Yes  Recall of three objects? Yes  Can perform simple calculations? Yes  Displays appropriate judgment?Yes  Can read the correct time from a watch face?Yes      Indicate any recent Medical Services you may have received from other than [facility] providers in the past year (date may be approximate).  N/A Screening Tests / Date see Epic Colonoscopy                     Zostavax  Mammogram  Influenza Vaccine  Tetanus/tdap   Objective:       General appearance: Appears  stated age and mildly obese  Head: Normocephalic, without obvious abnormality, atraumatic  Eyes: conj clear, EOMi PEERLA  Ears: normal TM's and external ear canals both ears  Nose: Nares normal. Septum midline. Mucosa normal. No drainage or sinus tenderness.  Throat: lips, mucosa, and tongue normal; teeth and gums normal  Neck: no adenopathy, no carotid bruit, no JVD, supple, symmetrical, trachea midline and thyroid not enlarged, symmetric, no tenderness/mass/nodules  No CVA tenderness.  Lungs: clear to auscultation bilaterally  Breasts: normal appearance, no masses or tenderness Heart: regular rate and rhythm, S1, S2 normal, no murmur, click, rub or gallop  Abdomen: soft, non-tender; bowel sounds normal; no masses, no organomegaly  Musculoskeletal: ROM normal in all joints, no crepitus, no deformity, Normal muscle strengthen. Back  is symmetric, no curvature. Skin: Skin color, texture, turgor normal. No rashes or lesions  Lymph nodes: Cervical, supraclavicular, and axillary nodes normal.  Neurologic: CN 2 -12 Normal, Normal symmetric reflexes. Normal coordination and gait  Psych: Alert & Oriented x 3, Mood appear stable.    Assessment:    Annual wellness medicare exam   Plan:    During the course of the visit the patient was educated and counseled about  appropriate screening and preventive services including:  Screening mammography  Colorectal cancer screening Diet review for nutrition referral? Yes ____ Not Indicated __x__  Patient Instructions (the written plan) was given to the patient.  Medicare Attestation  I have personally reviewed:  The patient's medical and social history  Their use of alcohol, tobacco or illicit drugs  Their current medications and supplements  The patient's functional ability including ADLs,fall risks, home safety risks, cognitive, and hearing and visual impairment  Diet and physical activities  Evidence for depression or mood disorders  The  patient's weight, height, BMI, and visual acuity have been recorded in the chart. I have made referrals, counseling, and provided education to the patient based on review of the above and I have provided the patient with a written personalized care plan for preventive services.    !

## 2014-03-24 ENCOUNTER — Other Ambulatory Visit: Payer: Medicare Other | Admitting: Internal Medicine

## 2014-03-24 DIAGNOSIS — R7989 Other specified abnormal findings of blood chemistry: Secondary | ICD-10-CM | POA: Diagnosis not present

## 2014-03-24 DIAGNOSIS — R748 Abnormal levels of other serum enzymes: Secondary | ICD-10-CM | POA: Diagnosis not present

## 2014-03-24 DIAGNOSIS — D649 Anemia, unspecified: Secondary | ICD-10-CM

## 2014-03-24 LAB — CBC WITH DIFFERENTIAL/PLATELET
Basophils Absolute: 0 10*3/uL (ref 0.0–0.1)
Basophils Relative: 0 % (ref 0–1)
EOS ABS: 0.1 10*3/uL (ref 0.0–0.7)
Eosinophils Relative: 2 % (ref 0–5)
HCT: 40 % (ref 36.0–46.0)
Hemoglobin: 13.2 g/dL (ref 12.0–15.0)
Lymphocytes Relative: 35 % (ref 12–46)
Lymphs Abs: 1.9 10*3/uL (ref 0.7–4.0)
MCH: 29.2 pg (ref 26.0–34.0)
MCHC: 33 g/dL (ref 30.0–36.0)
MCV: 88.5 fL (ref 78.0–100.0)
MPV: 10.6 fL (ref 8.6–12.4)
Monocytes Absolute: 0.3 10*3/uL (ref 0.1–1.0)
Monocytes Relative: 6 % (ref 3–12)
NEUTROS PCT: 57 % (ref 43–77)
Neutro Abs: 3.1 10*3/uL (ref 1.7–7.7)
Platelets: 202 10*3/uL (ref 150–400)
RBC: 4.52 MIL/uL (ref 3.87–5.11)
RDW: 15 % (ref 11.5–15.5)
WBC: 5.5 10*3/uL (ref 4.0–10.5)

## 2014-03-24 LAB — BASIC METABOLIC PANEL
BUN: 19 mg/dL (ref 6–23)
CALCIUM: 9.6 mg/dL (ref 8.4–10.5)
CO2: 27 mEq/L (ref 19–32)
Chloride: 105 mEq/L (ref 96–112)
Creat: 1.18 mg/dL — ABNORMAL HIGH (ref 0.50–1.10)
GLUCOSE: 91 mg/dL (ref 70–99)
Potassium: 4 mEq/L (ref 3.5–5.3)
Sodium: 141 mEq/L (ref 135–145)

## 2014-04-07 ENCOUNTER — Encounter: Payer: Self-pay | Admitting: Internal Medicine

## 2014-04-07 ENCOUNTER — Ambulatory Visit (INDEPENDENT_AMBULATORY_CARE_PROVIDER_SITE_OTHER): Payer: Medicare Other | Admitting: Internal Medicine

## 2014-04-07 VITALS — BP 144/98 | HR 68 | Temp 97.7°F | Wt 200.0 lb

## 2014-04-07 DIAGNOSIS — R748 Abnormal levels of other serum enzymes: Secondary | ICD-10-CM

## 2014-04-07 DIAGNOSIS — I10 Essential (primary) hypertension: Secondary | ICD-10-CM

## 2014-04-07 DIAGNOSIS — E785 Hyperlipidemia, unspecified: Secondary | ICD-10-CM | POA: Diagnosis not present

## 2014-04-07 DIAGNOSIS — E119 Type 2 diabetes mellitus without complications: Secondary | ICD-10-CM | POA: Diagnosis not present

## 2014-04-07 DIAGNOSIS — R7989 Other specified abnormal findings of blood chemistry: Secondary | ICD-10-CM

## 2014-04-07 MED ORDER — AMLODIPINE BESYLATE 5 MG PO TABS: 5.0000 mg | ORAL_TABLET | Freq: Every day | ORAL | Status: DC

## 2014-04-07 NOTE — Patient Instructions (Signed)
Start Norvasc 5 mg daily and return in 4 weeks.

## 2014-04-07 NOTE — Progress Notes (Signed)
   Subjective:    Patient ID: Kelly [name], female    DOB: [birth year], 69 y.o.   MRN: [id number]  HPI  In today to follow-up on hypertension. Her serum creatinine decreased off lisinopril to 1.18 which was much better. She has not been on any antihypertensive medication for several weeks. She feels well. Samples of Crestor 10 mg given today for hyperlipidemia. However blood pressure is elevated today at 144/98.     Review of Systems     Objective:   Physical Exam  Skin warm and dry. Nodes none. Neck is supple. Chest clear to auscultation. Extremities without edema      Assessment & Plan:  Hypertension  Type 2 diabetes mellitus-controlled  Hyperlipidemia-samples of Crestor given  Plan: She will start Norvasc 5 mg daily and return in 4 weeks for office visit, basic metabolic panel and blood pressure check.

## 2014-05-05 ENCOUNTER — Other Ambulatory Visit: Payer: Medicare Other | Admitting: Internal Medicine

## 2014-05-05 DIAGNOSIS — R7989 Other specified abnormal findings of blood chemistry: Secondary | ICD-10-CM

## 2014-05-05 DIAGNOSIS — R748 Abnormal levels of other serum enzymes: Secondary | ICD-10-CM | POA: Diagnosis not present

## 2014-05-05 LAB — BASIC METABOLIC PANEL
BUN: 23 mg/dL (ref 6–23)
CALCIUM: 9.8 mg/dL (ref 8.4–10.5)
CO2: 30 mEq/L (ref 19–32)
Chloride: 105 mEq/L (ref 96–112)
Creat: 1.15 mg/dL — ABNORMAL HIGH (ref 0.50–1.10)
GLUCOSE: 95 mg/dL (ref 70–99)
Potassium: 4.6 mEq/L (ref 3.5–5.3)
SODIUM: 141 meq/L (ref 135–145)

## 2014-05-06 DIAGNOSIS — M1711 Unilateral primary osteoarthritis, right knee: Secondary | ICD-10-CM | POA: Diagnosis not present

## 2014-05-12 ENCOUNTER — Encounter: Payer: Self-pay | Admitting: Internal Medicine

## 2014-05-12 ENCOUNTER — Ambulatory Visit (INDEPENDENT_AMBULATORY_CARE_PROVIDER_SITE_OTHER): Payer: Medicare Other | Admitting: Internal Medicine

## 2014-05-12 VITALS — BP 132/86 | HR 73 | Temp 98.0°F | Wt 200.0 lb

## 2014-05-12 DIAGNOSIS — R748 Abnormal levels of other serum enzymes: Secondary | ICD-10-CM

## 2014-05-12 DIAGNOSIS — I10 Essential (primary) hypertension: Secondary | ICD-10-CM

## 2014-05-12 DIAGNOSIS — R7989 Other specified abnormal findings of blood chemistry: Secondary | ICD-10-CM

## 2014-05-12 NOTE — Progress Notes (Signed)
   Subjective:    Patient ID: Kelly [name], female    DOB: 02-11-1946, 69 y.o.   MRN: [id number]  HPI  In today to follow-up on elevated serum creatinine. ACE inhibitor was discontinued several weeks ago. Creatinine has now improved. Creatinine is 1.15. Blood pressure is stable. She feels well. She had right knee injected last week with Synovisc. Says diabetes is stable at the present time. Hemoglobin A1c not checked this time. Lipid panel not checked this time. Samples of Crestor provided.    Review of Systems     Objective:   Physical Exam  Skin warm and dry. Nodes none. Chest clear. Cardiac exam regular rate and rhythm. Extremities without edema.        Assessment & Plan:  Elevated serum creatinine-improving off ACE inhibitor  Type 2 diabetes mellitus  Hypertension  Right knee osteoarthritis  Hyperlipidemia  Plan: Return in 4 months at which time she'll have drawn he will have an A1c lipid panel, liver functions and basic metabolic panel.

## 2014-05-12 NOTE — Patient Instructions (Signed)
Continue same medications and return in 4 months

## 2014-06-03 ENCOUNTER — Other Ambulatory Visit: Payer: Self-pay | Admitting: Internal Medicine

## 2014-06-10 ENCOUNTER — Other Ambulatory Visit: Payer: Self-pay | Admitting: Internal Medicine

## 2014-06-10 DIAGNOSIS — Z1231 Encounter for screening mammogram for malignant neoplasm of breast: Secondary | ICD-10-CM

## 2014-06-17 ENCOUNTER — Ambulatory Visit (HOSPITAL_COMMUNITY)
Admission: RE | Admit: 2014-06-17 | Discharge: 2014-06-17 | Disposition: A | Discharge status: Home / Self Care | Payer: Medicare Other | Source: Ambulatory Visit | Attending: Internal Medicine | Admitting: Internal Medicine

## 2014-06-17 DIAGNOSIS — Z1231 Encounter for screening mammogram for malignant neoplasm of breast: Secondary | ICD-10-CM | POA: Diagnosis not present

## 2014-06-18 DIAGNOSIS — M1711 Unilateral primary osteoarthritis, right knee: Secondary | ICD-10-CM | POA: Diagnosis not present

## 2014-08-03 ENCOUNTER — Ambulatory Visit (INDEPENDENT_AMBULATORY_CARE_PROVIDER_SITE_OTHER): Payer: Medicare Other | Admitting: Internal Medicine

## 2014-08-03 ENCOUNTER — Encounter: Payer: Self-pay | Admitting: Internal Medicine

## 2014-08-03 VITALS — BP 120/84 | HR 70 | Temp 97.2°F | Wt 194.5 lb

## 2014-08-03 DIAGNOSIS — M62838 Other muscle spasm: Secondary | ICD-10-CM

## 2014-08-03 DIAGNOSIS — M6248 Contracture of muscle, other site: Secondary | ICD-10-CM

## 2014-08-03 DIAGNOSIS — S1096XA Insect bite of unspecified part of neck, initial encounter: Secondary | ICD-10-CM | POA: Diagnosis not present

## 2014-08-03 DIAGNOSIS — W57XXXA Bitten or stung by nonvenomous insect and other nonvenomous arthropods, initial encounter: Secondary | ICD-10-CM

## 2014-08-03 MED ORDER — DOXYCYCLINE HYCLATE 100 MG PO TABS: 100.0000 mg | ORAL_TABLET | Freq: Two times a day (BID) | ORAL | Status: DC

## 2014-08-03 NOTE — Patient Instructions (Signed)
Take doxycycline as prescribed. Apply ice to neck 20 minutes twice daily. Call if not better in 5-7 days or sooner if worse

## 2014-08-03 NOTE — Progress Notes (Signed)
   Subjective:    Patient ID: Kelly [name], female    DOB: 07-24-1945, 69 y.o.   MRN: [id number]  HPI 69 year old Female was out in the yard and noticed a pimple-like lesion back of neck. Subsequently began to feel discomfort in left neck as well as right neck. Thought there was some fullness in the right neck area. No fever or chills. No headache.    Review of Systems     Objective:   Physical Exam  She has an erythematous macular lesion midline back of neck does not appear to be secondarily infected. She has palpable muscle spasm in the left sternocleidomastoid muscle area that is tender to touch. Similar area right sternocleidomastoid muscle area.      Assessment & Plan:  Insect bite neck  Muscle spasm  Plan: Does not want pain medication or muscle relaxant. Treat with doxycycline 100 mg twice daily for 7 days. Apply ice to neck. Says she will get a massage.

## 2014-08-12 IMAGING — CR DG CHEST 2V
2 series · 2 of 2 positions shown · non-contrast
Comparison: None.

CLINICAL DATA: Preop

CHEST - 2 VIEW

[w chest pa]
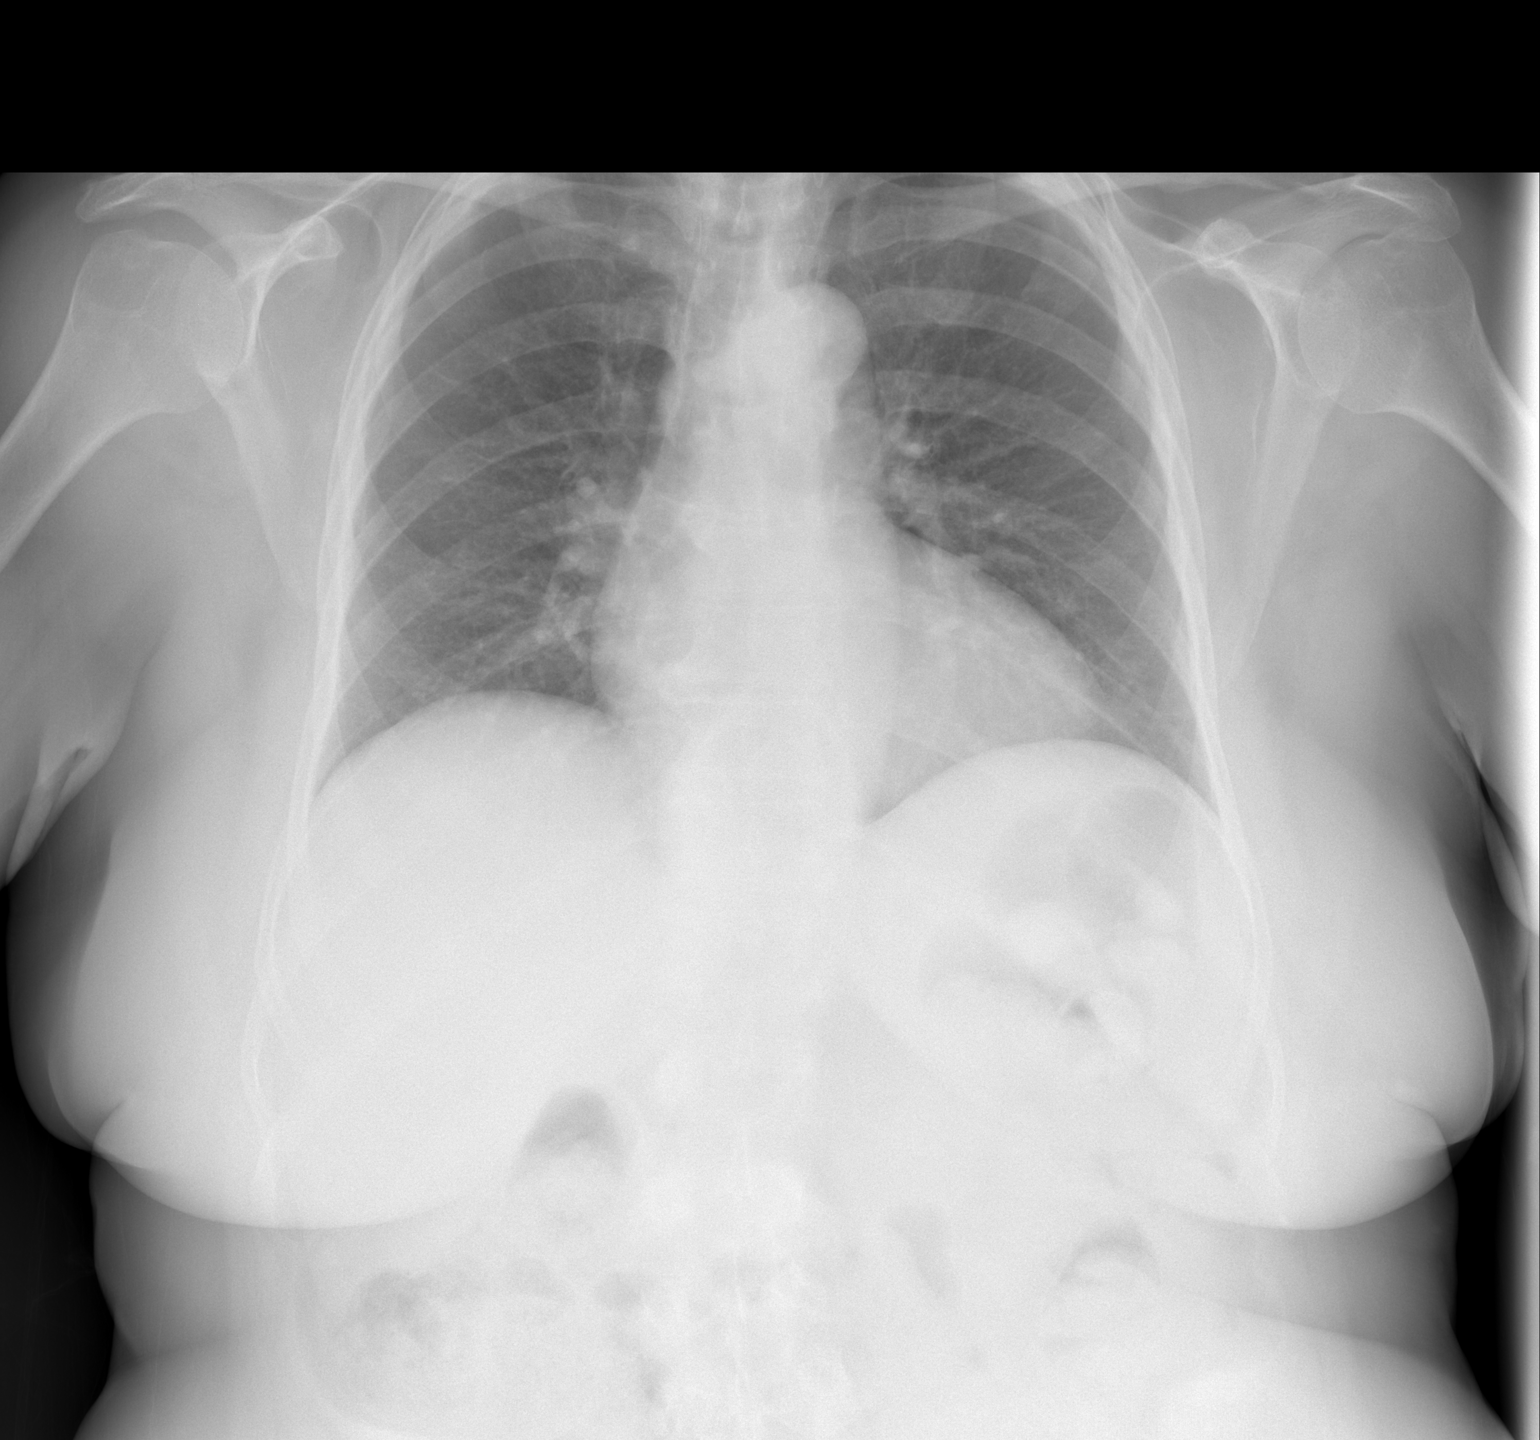

[w chest lat]
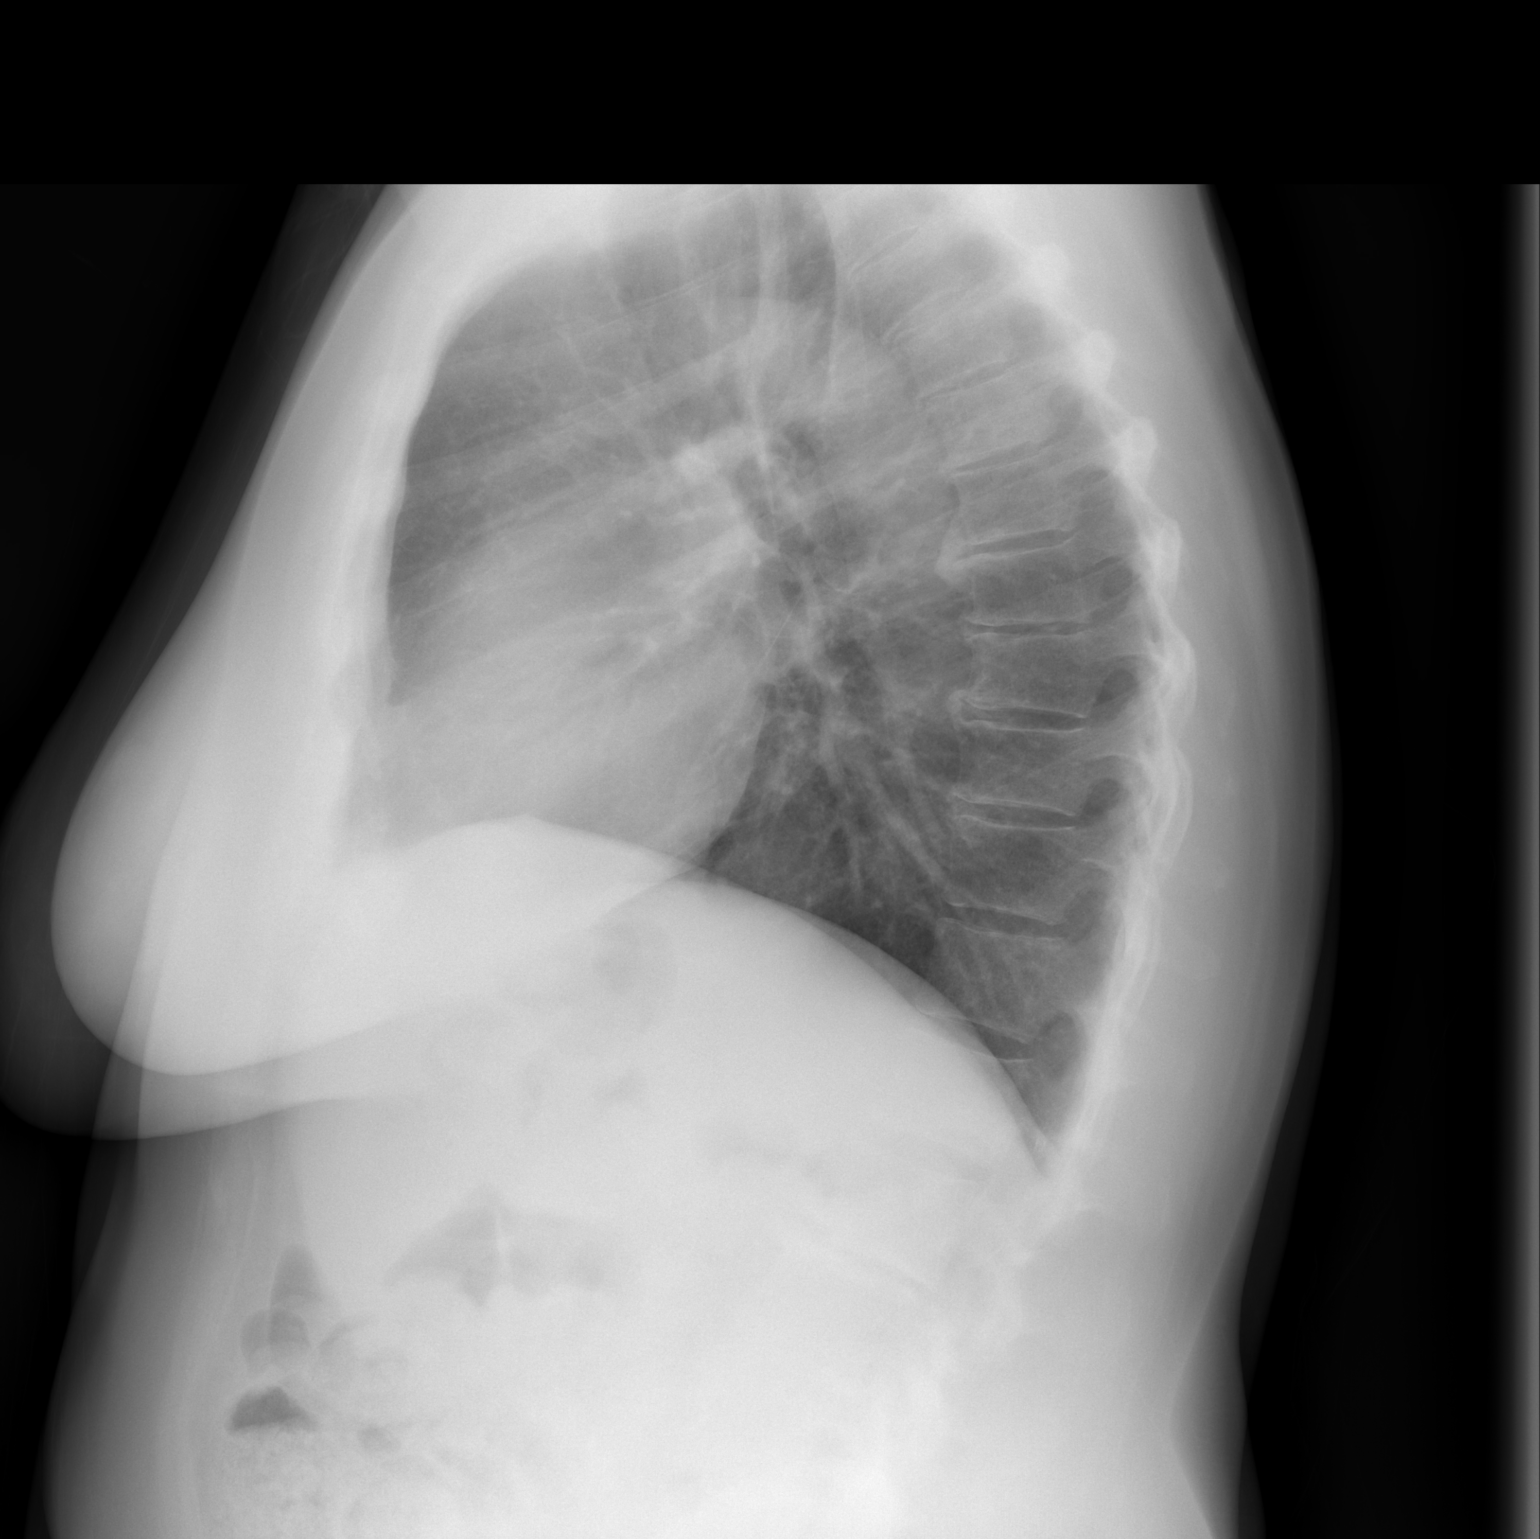

[2 of 2 positions shown; findings below may reference images not displayed]

FINDINGS: Cardiomediastinal silhouette is unremarkable.  No acute
infiltrate or pleural effusion.  No pulmonary edema.  Mild
degenerative changes mid thoracic spine.
IMPRESSION: No active disease.

## 2014-09-03 DIAGNOSIS — M1711 Unilateral primary osteoarthritis, right knee: Secondary | ICD-10-CM | POA: Diagnosis not present

## 2014-09-08 ENCOUNTER — Other Ambulatory Visit: Payer: Medicare Other | Admitting: Internal Medicine

## 2014-09-10 ENCOUNTER — Encounter: Payer: Self-pay | Admitting: Internal Medicine

## 2014-09-10 ENCOUNTER — Other Ambulatory Visit: Payer: Medicare Other | Admitting: Internal Medicine

## 2014-09-10 ENCOUNTER — Ambulatory Visit (INDEPENDENT_AMBULATORY_CARE_PROVIDER_SITE_OTHER): Payer: Medicare Other | Admitting: Internal Medicine

## 2014-09-10 VITALS — BP 120/82 | HR 86 | Temp 97.9°F | Wt 194.5 lb

## 2014-09-10 DIAGNOSIS — Z79899 Other long term (current) drug therapy: Secondary | ICD-10-CM | POA: Diagnosis not present

## 2014-09-10 DIAGNOSIS — I10 Essential (primary) hypertension: Secondary | ICD-10-CM | POA: Diagnosis not present

## 2014-09-10 DIAGNOSIS — E785 Hyperlipidemia, unspecified: Secondary | ICD-10-CM

## 2014-09-10 DIAGNOSIS — E119 Type 2 diabetes mellitus without complications: Secondary | ICD-10-CM | POA: Diagnosis not present

## 2014-09-10 MED ORDER — ROSUVASTATIN CALCIUM 10 MG PO TABS: 10.0000 mg | ORAL_TABLET | Freq: Every day | ORAL | Status: DC

## 2014-09-10 NOTE — Patient Instructions (Signed)
Continue same medications. Good luck with knee surgery. Call for appointment for physical examination to be done late November or early December.

## 2014-09-10 NOTE — Progress Notes (Signed)
   Subjective:    Patient ID: Kelly [name], female    DOB: 12/12/1945, 69 y.o.   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 She is planning to have partial right knee replacement by Dr. Rush Farmer August 19. After surgery, she will go to [facility] for [facility] since she lives alone. Had surgery for failed left knee arthroscopy in 2013.    Review of Systems     Objective:   Physical Exam Neck is supple without JVD thyromegaly or carotid bruits. Chest clear to auscultation without rales or wheezing. Cardiac exam regular rate and rhythm normal S1 and S2 without murmurs or gallops. Extremities without edema  Blood pressure under excellent control with amlodipine alone.     Assessment & Plan:  Controlled type 2 diabetes mellitus-hemoglobin A1c pending  Hyperlipidemia-can switch from non-generic to generic Crestor because of expense. Lipid panel/ liver functions pending  History of renal insufficiency that improved with stopping lisinopril. Basic metabolic panel drawn.  Plan: Surgery for partial right knee replacement August 19 planned. Lab work for medical problems here including renal function, lipid panel and liver functions drawn and pending. Patient is to call regarding physical exam which is due in early November. Would like to give her time to get over knee surgery. Continue same medications.

## 2014-09-11 ENCOUNTER — Telehealth: Payer: Self-pay | Admitting: *Deleted

## 2014-09-11 ENCOUNTER — Ambulatory Visit: Payer: Medicare Other | Admitting: Internal Medicine

## 2014-09-11 ENCOUNTER — Other Ambulatory Visit (HOSPITAL_COMMUNITY): Payer: Self-pay | Admitting: Orthopaedic Surgery

## 2014-09-11 LAB — LIPID PANEL
CHOLESTEROL: 170 mg/dL (ref 125–200)
HDL: 33 mg/dL — AB (ref >=46)
LDL CALC: 97 mg/dL (ref <130)
Total CHOL/HDL Ratio: 5.2 Ratio — ABNORMAL HIGH (ref <=5.0)
Triglycerides: 201 mg/dL — ABNORMAL HIGH (ref <150)
VLDL: 40 mg/dL — AB (ref <30)

## 2014-09-11 LAB — HEPATIC FUNCTION PANEL
ALK PHOS: 117 U/L (ref 33–130)
ALT: 16 U/L (ref 6–29)
AST: 20 U/L (ref 10–35)
Albumin: 4.1 g/dL (ref 3.6–5.1)
Bilirubin, Direct: 0.1 mg/dL (ref <=0.2)
Indirect Bilirubin: 0.7 mg/dL (ref 0.2–1.2)
Total Bilirubin: 0.8 mg/dL (ref 0.2–1.2)
Total Protein: 6.9 g/dL (ref 6.1–8.1)

## 2014-09-11 LAB — BASIC METABOLIC PANEL
BUN: 17 mg/dL (ref 7–25)
CHLORIDE: 104 meq/L (ref 98–110)
CO2: 29 mEq/L (ref 20–31)
Calcium: 10.2 mg/dL (ref 8.6–10.4)
Creat: 1.27 mg/dL — ABNORMAL HIGH (ref 0.60–0.93)
Glucose, Bld: 87 mg/dL (ref 65–99)
POTASSIUM: 4.3 meq/L (ref 3.5–5.3)
Sodium: 141 mEq/L (ref 135–146)

## 2014-09-11 LAB — HEMOGLOBIN A1C
HEMOGLOBIN A1C: 5.8 % — AB (ref <5.7)
Mean Plasma Glucose: 120 mg/dL — ABNORMAL HIGH (ref <117)

## 2014-09-11 NOTE — Telephone Encounter (Signed)
Left message with lab results and instructions to continue crestor and watch diet on patient voice mail.

## 2014-09-23 NOTE — Patient Instructions (Signed)
Kelly [name]  09/23/2014   Your procedure is scheduled on:  October 02, 2014  Report to [facility] Main  Entrance take [place]  elevators to 3rd floor to  [place] at  7:50 AM.  Call this number if you have problems the morning of surgery [phone]   Remember: ONLY 1 PERSON MAY GO WITH YOU TO SHORT STAY TO GET  READY MORNING OF [name].  Do not eat food or drink liquids :After Midnight.     Take these medicines the morning of surgery with A SIP OF WATER: Amlodipine (norvasc)                               You may not have any metal on your body including hair pins and              piercings  Do not wear jewelry, make-up, lotions, powders or perfumes, deodorant             Do not wear nail polish.  Do not shave  48 hours prior to surgery.           .   Do not bring valuables to the hospital. [place].  Contacts, dentures or bridgework may not be worn into surgery.  Leave suitcase in the car. After surgery it may be brought to your room.    Marland Kitchen   Special Instructions:  Coughing and deep breathing exercises, leg exercises.              Please read over the following fact sheets you were given: _____________________________________________________________________             [facility] - Preparing for Surgery Before surgery, you can play an important role.  Because skin is not sterile, your skin needs to be as free of germs as possible.  You can reduce the number of germs on your skin by washing with CHG (chlorahexidine gluconate) soap before surgery.  CHG is an antiseptic cleaner which kills germs and bonds with the skin to continue killing germs even after washing. Please DO NOT use if you have an allergy to CHG or antibacterial soaps.  If your skin becomes reddened/irritated stop using the CHG and inform your nurse when you arrive at Short Stay. Do not shave (including legs and underarms) for  at least 48 hours prior to the first CHG shower.  You may shave your face/neck. Please follow these instructions carefully:  1.  Shower with CHG Soap the night before surgery and the  morning of Surgery.  2.  If you choose to wash your hair, wash your hair first as usual with your  normal  shampoo.  3.  After you shampoo, rinse your hair and body thoroughly to remove the  shampoo.                           4.  Use CHG as you would any other liquid soap.  You can apply chg directly  to the skin and wash                       Gently with a scrungie or clean washcloth.  5.  Apply the CHG Soap to your body ONLY FROM THE NECK DOWN.   Do not use on face/ open                           Wound or open sores. Avoid contact with eyes, ears mouth and genitals (private parts).                       Wash face,  Genitals (private parts) with your normal soap.             6.  Wash thoroughly, paying special attention to the area where your surgery  will be performed.  7.  Thoroughly rinse your body with warm water from the neck down.  8.  DO NOT shower/wash with your normal soap after using and rinsing off  the CHG Soap.                9.  Pat yourself dry with a clean towel.            10.  Wear clean pajamas.            11.  Place clean sheets on your bed the night of your first shower and do not  sleep with pets. Day of Surgery : Do not apply any lotions/deodorants the morning of surgery.  Please wear clean clothes to the hospital/surgery center.  FAILURE TO FOLLOW THESE INSTRUCTIONS MAY RESULT IN THE CANCELLATION OF YOUR SURGERY PATIENT SIGNATURE_________________________________  NURSE SIGNATURE__________________________________  ________________________________________________________________________

## 2014-09-24 ENCOUNTER — Encounter (HOSPITAL_COMMUNITY): Payer: Self-pay

## 2014-09-24 ENCOUNTER — Encounter (HOSPITAL_COMMUNITY)
Admission: RE | Admit: 2014-09-24 | Discharge: 2014-09-24 | Disposition: A | Discharge status: Home / Self Care | Payer: Medicare Other | Source: Ambulatory Visit | Attending: Orthopaedic Surgery | Admitting: Orthopaedic Surgery

## 2014-09-24 DIAGNOSIS — M1990 Unspecified osteoarthritis, unspecified site: Secondary | ICD-10-CM | POA: Diagnosis not present

## 2014-09-24 DIAGNOSIS — Z0183 Encounter for blood typing: Secondary | ICD-10-CM | POA: Diagnosis not present

## 2014-09-24 DIAGNOSIS — R001 Bradycardia, unspecified: Secondary | ICD-10-CM | POA: Diagnosis not present

## 2014-09-24 DIAGNOSIS — Z01812 Encounter for preprocedural laboratory examination: Secondary | ICD-10-CM | POA: Insufficient documentation

## 2014-09-24 DIAGNOSIS — Z79899 Other long term (current) drug therapy: Secondary | ICD-10-CM | POA: Diagnosis not present

## 2014-09-24 HISTORY — DX: Major depressive disorder, single episode, unspecified: F32.9

## 2014-09-24 HISTORY — DX: Depression, unspecified: F32.A

## 2014-09-24 LAB — BASIC METABOLIC PANEL
Anion gap: 9 (ref 5–15)
BUN: 23 mg/dL — AB (ref 6–20)
CALCIUM: 10.2 mg/dL (ref 8.9–10.3)
CO2: 29 mmol/L (ref 22–32)
Chloride: 105 mmol/L (ref 101–111)
Creatinine, Ser: 1.2 mg/dL — ABNORMAL HIGH (ref 0.44–1.00)
GFR calc Af Amer: 52 mL/min — ABNORMAL LOW (ref >60)
GFR, EST NON AFRICAN AMERICAN: 45 mL/min — AB (ref >60)
GLUCOSE: 95 mg/dL (ref 65–99)
Potassium: 4.3 mmol/L (ref 3.5–5.1)
Sodium: 143 mmol/L (ref 135–145)

## 2014-09-24 LAB — CBC
HCT: 43.8 % (ref 36.0–46.0)
Hemoglobin: 14.4 g/dL (ref 12.0–15.0)
MCH: 30.1 pg (ref 26.0–34.0)
MCHC: 32.9 g/dL (ref 30.0–36.0)
MCV: 91.6 fL (ref 78.0–100.0)
PLATELETS: 206 10*3/uL (ref 150–400)
RBC: 4.78 MIL/uL (ref 3.87–5.11)
RDW: 13.1 % (ref 11.5–15.5)
WBC: 5.4 10*3/uL (ref 4.0–10.5)

## 2014-09-24 LAB — PROTIME-INR
INR: 1.13 (ref 0.00–1.49)
Prothrombin Time: 14.7 seconds (ref 11.6–15.2)

## 2014-09-24 LAB — APTT: APTT: 38 s — AB (ref 24–37)

## 2014-09-24 LAB — SURGICAL PCR SCREEN
MRSA, PCR: NEGATIVE
Staphylococcus aureus: NEGATIVE

## 2014-09-24 NOTE — Progress Notes (Signed)
09-24-14 - PTT lab results from preop visit on 09-24-14 faxed to Dr. Ninfa Linden via [facility]

## 2014-09-24 NOTE — Progress Notes (Signed)
09-10-14 - LOV - Dr. Renold Genta (fam.med.) - EPIC 09-10-14 [facility] - EPIC 09-10-14 - Hepatic Function Panel - EPIC

## 2014-09-27 NOTE — Progress Notes (Signed)
09-27-14 - BMP lab results from preop visit on 09-24-14 faxed to Dr. Ninfa Linden via [facility]

## 2014-10-02 ENCOUNTER — Inpatient Hospital Stay (HOSPITAL_COMMUNITY): Payer: Medicare Other | Admitting: Anesthesiology

## 2014-10-02 ENCOUNTER — Inpatient Hospital Stay (HOSPITAL_COMMUNITY)
Admission: RE | Admit: 2014-10-02 | Discharge: 2014-10-05 | DRG: 470 | Disposition: A | Discharge status: Home Health | Payer: Medicare Other | Source: Ambulatory Visit | Attending: Orthopaedic Surgery | Admitting: Orthopaedic Surgery

## 2014-10-02 ENCOUNTER — Inpatient Hospital Stay (HOSPITAL_COMMUNITY): Payer: Medicare Other

## 2014-10-02 ENCOUNTER — Encounter (HOSPITAL_COMMUNITY): Payer: Self-pay | Admitting: Anesthesiology

## 2014-10-02 ENCOUNTER — Encounter (HOSPITAL_COMMUNITY)
Admission: RE | Disposition: A | Discharge status: Home Health | Payer: Self-pay | Source: Ambulatory Visit | Attending: Orthopaedic Surgery

## 2014-10-02 DIAGNOSIS — Z96651 Presence of right artificial knee joint: Secondary | ICD-10-CM

## 2014-10-02 DIAGNOSIS — Z451 Encounter for adjustment and management of infusion pump: Secondary | ICD-10-CM | POA: Diagnosis not present

## 2014-10-02 DIAGNOSIS — Z809 Family history of malignant neoplasm, unspecified: Secondary | ICD-10-CM

## 2014-10-02 DIAGNOSIS — E785 Hyperlipidemia, unspecified: Secondary | ICD-10-CM | POA: Diagnosis present

## 2014-10-02 DIAGNOSIS — M25761 Osteophyte, right knee: Secondary | ICD-10-CM | POA: Diagnosis not present

## 2014-10-02 DIAGNOSIS — Z01810 Encounter for preprocedural cardiovascular examination: Secondary | ICD-10-CM

## 2014-10-02 DIAGNOSIS — Z01812 Encounter for preprocedural laboratory examination: Secondary | ICD-10-CM

## 2014-10-02 DIAGNOSIS — E669 Obesity, unspecified: Secondary | ICD-10-CM | POA: Diagnosis present

## 2014-10-02 DIAGNOSIS — Z880 Allergy status to penicillin: Secondary | ICD-10-CM

## 2014-10-02 DIAGNOSIS — M179 Osteoarthritis of knee, unspecified: Secondary | ICD-10-CM | POA: Diagnosis not present

## 2014-10-02 DIAGNOSIS — N181 Chronic kidney disease, stage 1: Secondary | ICD-10-CM | POA: Diagnosis present

## 2014-10-02 DIAGNOSIS — I129 Hypertensive chronic kidney disease with stage 1 through stage 4 chronic kidney disease, or unspecified chronic kidney disease: Secondary | ICD-10-CM | POA: Diagnosis present

## 2014-10-02 DIAGNOSIS — D649 Anemia, unspecified: Secondary | ICD-10-CM | POA: Diagnosis not present

## 2014-10-02 DIAGNOSIS — F329 Major depressive disorder, single episode, unspecified: Secondary | ICD-10-CM | POA: Diagnosis present

## 2014-10-02 DIAGNOSIS — F419 Anxiety disorder, unspecified: Secondary | ICD-10-CM | POA: Diagnosis present

## 2014-10-02 DIAGNOSIS — M1711 Unilateral primary osteoarthritis, right knee: Principal | ICD-10-CM | POA: Diagnosis present

## 2014-10-02 DIAGNOSIS — E1122 Type 2 diabetes mellitus with diabetic chronic kidney disease: Secondary | ICD-10-CM | POA: Diagnosis not present

## 2014-10-02 DIAGNOSIS — M25461 Effusion, right knee: Secondary | ICD-10-CM | POA: Diagnosis present

## 2014-10-02 DIAGNOSIS — Z8249 Family history of ischemic heart disease and other diseases of the circulatory system: Secondary | ICD-10-CM

## 2014-10-02 DIAGNOSIS — Z9283 Personal history of failed moderate sedation: Secondary | ICD-10-CM

## 2014-10-02 DIAGNOSIS — Z6836 Body mass index (BMI) 36.0-36.9, adult: Secondary | ICD-10-CM

## 2014-10-02 HISTORY — PX: PARTIAL KNEE ARTHROPLASTY: SHX2174

## 2014-10-02 LAB — GLUCOSE, CAPILLARY
GLUCOSE-CAPILLARY: 92 mg/dL (ref 65–99)
Glucose-Capillary: 107 mg/dL — ABNORMAL HIGH (ref 65–99)

## 2014-10-02 LAB — TYPE AND SCREEN
ABO/RH(D): O POS
ANTIBODY SCREEN: NEGATIVE

## 2014-10-02 LAB — APTT: aPTT: 37 seconds (ref 24–37)

## 2014-10-02 SURGERY — ARTHROPLASTY, KNEE, UNICOMPARTMENTAL
Anesthesia: Spinal | Site: Knee | Laterality: Right

## 2014-10-02 MED ORDER — ALUM & MAG HYDROXIDE-SIMETH 200-200-20 MG/5ML PO SUSP: 30.0000 mL | ORAL | Status: DC | PRN

## 2014-10-02 MED ORDER — HYDROMORPHONE HCL 1 MG/ML IJ SOLN
0.2500 mg | INTRAMUSCULAR | Status: DC | PRN
  Administered 2014-10-02 (×4): 0.5 mg via INTRAVENOUS

## 2014-10-02 MED ORDER — PROPOFOL INFUSION 10 MG/ML OPTIME
INTRAVENOUS | Status: DC | PRN
  Administered 2014-10-02: 100 ug/kg/min via INTRAVENOUS

## 2014-10-02 MED ORDER — PROPOFOL 10 MG/ML IV BOLUS
INTRAVENOUS | Status: AC
  Filled 2014-10-02: qty 20

## 2014-10-02 MED ORDER — FENTANYL CITRATE (PF) 100 MCG/2ML IJ SOLN
INTRAMUSCULAR | Status: DC | PRN
  Administered 2014-10-02: 100 ug via INTRAVENOUS

## 2014-10-02 MED ORDER — ROSUVASTATIN CALCIUM 10 MG PO TABS
10.0000 mg | ORAL_TABLET | Freq: Every day | ORAL | Status: DC
  Administered 2014-10-03 – 2014-10-05 (×3): 10 mg via ORAL
  Filled 2014-10-02 (×4): qty 1

## 2014-10-02 MED ORDER — ONDANSETRON HCL 4 MG/2ML IJ SOLN
INTRAMUSCULAR | Status: DC | PRN
Start: 2014-10-02 — End: 2014-10-02
  Administered 2014-10-02: 4 mg via INTRAVENOUS

## 2014-10-02 MED ORDER — ASPIRIN EC 325 MG PO TBEC
325.0000 mg | DELAYED_RELEASE_TABLET | Freq: Two times a day (BID) | ORAL | Status: DC
  Administered 2014-10-03 – 2014-10-05 (×6): 325 mg via ORAL
  Filled 2014-10-02 (×8): qty 1

## 2014-10-02 MED ORDER — DOCUSATE SODIUM 100 MG PO CAPS
100.0000 mg | ORAL_CAPSULE | Freq: Two times a day (BID) | ORAL | Status: DC
  Administered 2014-10-02 – 2014-10-05 (×6): 100 mg via ORAL

## 2014-10-02 MED ORDER — ONDANSETRON HCL 4 MG PO TABS
4.0000 mg | ORAL_TABLET | Freq: Four times a day (QID) | ORAL | Status: DC | PRN
  Administered 2014-10-03 – 2014-10-04 (×2): 4 mg via ORAL
  Filled 2014-10-02 (×2): qty 1

## 2014-10-02 MED ORDER — LACTATED RINGERS IV SOLN
INTRAVENOUS | Status: DC
  Administered 2014-10-02: 1000 mL via INTRAVENOUS
  Administered 2014-10-02: 12:00:00 via INTRAVENOUS

## 2014-10-02 MED ORDER — HYDROMORPHONE HCL 1 MG/ML IJ SOLN
INTRAMUSCULAR | Status: AC
  Filled 2014-10-02: qty 1

## 2014-10-02 MED ORDER — METOCLOPRAMIDE HCL 5 MG PO TABS
5.0000 mg | ORAL_TABLET | Freq: Three times a day (TID) | ORAL | Status: DC | PRN
  Filled 2014-10-02: qty 2

## 2014-10-02 MED ORDER — BUPIVACAINE HCL (PF) 0.75 % IJ SOLN
INTRAMUSCULAR | Status: DC | PRN
  Administered 2014-10-02: 1.6 mL

## 2014-10-02 MED ORDER — SODIUM CHLORIDE 0.9 % IR SOLN
Status: DC | PRN
  Administered 2014-10-02: 1000 mL

## 2014-10-02 MED ORDER — ACETAMINOPHEN 325 MG PO TABS
650.0000 mg | ORAL_TABLET | Freq: Four times a day (QID) | ORAL | Status: DC | PRN
  Administered 2014-10-03 – 2014-10-05 (×3): 650 mg via ORAL
  Filled 2014-10-02 (×3): qty 2

## 2014-10-02 MED ORDER — CLINDAMYCIN PHOSPHATE 600 MG/50ML IV SOLN
600.0000 mg | Freq: Four times a day (QID) | INTRAVENOUS | Status: AC
  Administered 2014-10-02 (×2): 600 mg via INTRAVENOUS
  Filled 2014-10-02 (×2): qty 50

## 2014-10-02 MED ORDER — HYDROMORPHONE HCL 1 MG/ML IJ SOLN
0.2500 mg | INTRAMUSCULAR | Status: DC | PRN
  Administered 2014-10-02 (×2): 0.5 mg via INTRAVENOUS

## 2014-10-02 MED ORDER — SODIUM CHLORIDE 0.9 % IV SOLN
INTRAVENOUS | Status: DC
  Administered 2014-10-02: 75 mL/h via INTRAVENOUS
  Administered 2014-10-03: 06:00:00 via INTRAVENOUS

## 2014-10-02 MED ORDER — FERROUS GLUCONATE 324 (38 FE) MG PO TABS
324.0000 mg | ORAL_TABLET | Freq: Two times a day (BID) | ORAL | Status: DC
  Administered 2014-10-03 – 2014-10-05 (×5): 324 mg via ORAL
  Filled 2014-10-02 (×8): qty 1

## 2014-10-02 MED ORDER — METOCLOPRAMIDE HCL 5 MG/ML IJ SOLN
5.0000 mg | Freq: Three times a day (TID) | INTRAMUSCULAR | Status: DC | PRN
  Administered 2014-10-04: 10 mg via INTRAVENOUS
  Filled 2014-10-02: qty 2

## 2014-10-02 MED ORDER — DIPHENHYDRAMINE HCL 12.5 MG/5ML PO ELIX: 12.5000 mg | ORAL_SOLUTION | ORAL | Status: DC | PRN

## 2014-10-02 MED ORDER — ONDANSETRON HCL 4 MG/2ML IJ SOLN
4.0000 mg | Freq: Four times a day (QID) | INTRAMUSCULAR | Status: DC | PRN
  Administered 2014-10-02 – 2014-10-04 (×2): 4 mg via INTRAVENOUS
  Filled 2014-10-02 (×2): qty 2

## 2014-10-02 MED ORDER — KETOROLAC TROMETHAMINE 15 MG/ML IJ SOLN
7.5000 mg | Freq: Four times a day (QID) | INTRAMUSCULAR | Status: AC
  Administered 2014-10-02 – 2014-10-03 (×4): 7.5 mg via INTRAVENOUS
  Filled 2014-10-02 (×5): qty 1

## 2014-10-02 MED ORDER — 0.9 % SODIUM CHLORIDE (POUR BTL) OPTIME
TOPICAL | Status: DC | PRN
  Administered 2014-10-02: 1000 mL

## 2014-10-02 MED ORDER — CLINDAMYCIN PHOSPHATE 900 MG/50ML IV SOLN
900.0000 mg | INTRAVENOUS | Status: AC
  Administered 2014-10-02: 900 mg via INTRAVENOUS

## 2014-10-02 MED ORDER — METHOCARBAMOL 1000 MG/10ML IJ SOLN
500.0000 mg | Freq: Four times a day (QID) | INTRAVENOUS | Status: DC | PRN
  Administered 2014-10-02 – 2014-10-03 (×2): 500 mg via INTRAVENOUS
  Filled 2014-10-02 (×4): qty 5

## 2014-10-02 MED ORDER — OXYCODONE HCL 5 MG PO TABS
5.0000 mg | ORAL_TABLET | ORAL | Status: DC | PRN
  Administered 2014-10-02: 15 mg via ORAL
  Administered 2014-10-02: 10 mg via ORAL
  Administered 2014-10-02 – 2014-10-03 (×2): 15 mg via ORAL
  Administered 2014-10-03 – 2014-10-04 (×5): 10 mg via ORAL
  Administered 2014-10-04: 15 mg via ORAL
  Administered 2014-10-04: 10 mg via ORAL
  Administered 2014-10-04: 15 mg via ORAL
  Administered 2014-10-04 – 2014-10-05 (×2): 10 mg via ORAL
  Administered 2014-10-05 (×2): 15 mg via ORAL
  Administered 2014-10-05: 10 mg via ORAL
  Filled 2014-10-02: qty 3
  Filled 2014-10-02: qty 2
  Filled 2014-10-02 (×3): qty 3
  Filled 2014-10-02 (×2): qty 2
  Filled 2014-10-02 (×2): qty 3
  Filled 2014-10-02 (×4): qty 2
  Filled 2014-10-02 (×4): qty 3

## 2014-10-02 MED ORDER — ACETAMINOPHEN 650 MG RE SUPP: 650.0000 mg | Freq: Four times a day (QID) | RECTAL | Status: DC | PRN

## 2014-10-02 MED ORDER — VITAMIN D3 25 MCG (1000 UNIT) PO TABS
1000.0000 [IU] | ORAL_TABLET | Freq: Every day | ORAL | Status: DC
  Administered 2014-10-03 – 2014-10-05 (×3): 1000 [IU] via ORAL
  Filled 2014-10-02 (×4): qty 1

## 2014-10-02 MED ORDER — DEXAMETHASONE SODIUM PHOSPHATE 10 MG/ML IJ SOLN
INTRAMUSCULAR | Status: DC | PRN
  Administered 2014-10-02: 10 mg via INTRAVENOUS

## 2014-10-02 MED ORDER — DEXAMETHASONE SODIUM PHOSPHATE 10 MG/ML IJ SOLN
INTRAMUSCULAR | Status: AC
  Filled 2014-10-02: qty 1

## 2014-10-02 MED ORDER — MENTHOL 3 MG MT LOZG: 1.0000 | LOZENGE | OROMUCOSAL | Status: DC | PRN

## 2014-10-02 MED ORDER — ONDANSETRON HCL 4 MG/2ML IJ SOLN
INTRAMUSCULAR | Status: AC
  Filled 2014-10-02: qty 2

## 2014-10-02 MED ORDER — METHOCARBAMOL 500 MG PO TABS
500.0000 mg | ORAL_TABLET | Freq: Four times a day (QID) | ORAL | Status: DC | PRN
  Administered 2014-10-02 – 2014-10-05 (×8): 500 mg via ORAL
  Filled 2014-10-02 (×8): qty 1

## 2014-10-02 MED ORDER — CLINDAMYCIN PHOSPHATE 900 MG/50ML IV SOLN
INTRAVENOUS | Status: AC
Start: 2014-10-02 — End: 2014-10-02
  Filled 2014-10-02: qty 50

## 2014-10-02 MED ORDER — CEFAZOLIN SODIUM-DEXTROSE 2-3 GM-% IV SOLR
INTRAVENOUS | Status: AC
  Filled 2014-10-02: qty 50

## 2014-10-02 MED ORDER — MIDAZOLAM HCL 2 MG/2ML IJ SOLN
INTRAMUSCULAR | Status: AC
  Filled 2014-10-02: qty 4

## 2014-10-02 MED ORDER — HYDROMORPHONE HCL 1 MG/ML IJ SOLN
1.0000 mg | INTRAMUSCULAR | Status: DC | PRN
  Administered 2014-10-02 – 2014-10-05 (×5): 1 mg via INTRAVENOUS
  Filled 2014-10-02 (×5): qty 1

## 2014-10-02 MED ORDER — FENTANYL CITRATE (PF) 100 MCG/2ML IJ SOLN
INTRAMUSCULAR | Status: AC
  Filled 2014-10-02: qty 4

## 2014-10-02 MED ORDER — PHENYLEPHRINE 40 MCG/ML (10ML) SYRINGE FOR IV PUSH (FOR BLOOD PRESSURE SUPPORT)
PREFILLED_SYRINGE | INTRAVENOUS | Status: AC
  Filled 2014-10-02: qty 10

## 2014-10-02 MED ORDER — PHENYLEPHRINE HCL 10 MG/ML IJ SOLN
INTRAMUSCULAR | Status: DC | PRN
  Administered 2014-10-02: 40 ug via INTRAVENOUS
  Administered 2014-10-02: 80 ug via INTRAVENOUS
  Administered 2014-10-02: 40 ug via INTRAVENOUS
  Administered 2014-10-02 (×3): 80 ug via INTRAVENOUS

## 2014-10-02 MED ORDER — MIDAZOLAM HCL 5 MG/5ML IJ SOLN
INTRAMUSCULAR | Status: DC | PRN
  Administered 2014-10-02: 2 mg via INTRAVENOUS

## 2014-10-02 MED ORDER — ZOLPIDEM TARTRATE 5 MG PO TABS: 5.0000 mg | ORAL_TABLET | Freq: Every evening | ORAL | Status: DC | PRN

## 2014-10-02 MED ORDER — AMLODIPINE BESYLATE 5 MG PO TABS
5.0000 mg | ORAL_TABLET | Freq: Every day | ORAL | Status: DC
  Administered 2014-10-03 – 2014-10-05 (×3): 5 mg via ORAL
  Filled 2014-10-02 (×4): qty 1

## 2014-10-02 MED ORDER — PHENOL 1.4 % MT LIQD
1.0000 | OROMUCOSAL | Status: DC | PRN
  Filled 2014-10-02: qty 177

## 2014-10-02 SURGICAL SUPPLY — 62 items
APL SKNCLS STERI-STRIP NONHPOA (GAUZE/BANDAGES/DRESSINGS) ×1
BAG SPEC THK2 15X12 ZIP CLS (MISCELLANEOUS)
BAG ZIPLOCK 12X15 (MISCELLANEOUS) IMPLANT
BANDAGE ELASTIC 6 VELCRO ST LF (GAUZE/BANDAGES/DRESSINGS) ×2 IMPLANT
BANDAGE ESMARK 6X9 LF (GAUZE/BANDAGES/DRESSINGS) ×1 IMPLANT
BENZOIN TINCTURE PRP APPL 2/3 (GAUZE/BANDAGES/DRESSINGS) ×1 IMPLANT
BIT DRILL QUICK REL 1/8 2PK SL (DRILL) IMPLANT
BNDG CMPR 9X6 STRL LF SNTH (GAUZE/BANDAGES/DRESSINGS) ×1
BNDG ESMARK 6X9 LF (GAUZE/BANDAGES/DRESSINGS) ×2
BONE CEMENT PALACOSE (Orthopedic Implant) ×2 IMPLANT
BOWL SMART MIX CTS (DISPOSABLE) ×2 IMPLANT
CAPT KNEE PARTIAL 2 ×1 IMPLANT
CEMENT BONE PALACOSE (Orthopedic Implant) IMPLANT
CUFF TOURN SGL QUICK 34 (TOURNIQUET CUFF) ×2
CUFF TRNQT CYL 34X4X40X1 (TOURNIQUET CUFF) ×1 IMPLANT
DRAPE EXTREMITY T 121X128X90 (DRAPE) ×2 IMPLANT
DRAPE POUCH INSTRU U-SHP 10X18 (DRAPES) ×2 IMPLANT
DRAPE U-SHAPE 47X51 STRL (DRAPES) ×2 IMPLANT
DRILL QUICK RELEASE 1/8 INCH (DRILL) ×1
DRSG AQUACEL AG ADV 3.5X10 (GAUZE/BANDAGES/DRESSINGS) ×1 IMPLANT
DRSG PAD ABDOMINAL 8X10 ST (GAUZE/BANDAGES/DRESSINGS) ×2 IMPLANT
DURAPREP 26ML APPLICATOR (WOUND CARE) ×2 IMPLANT
ELECT REM PT RETURN 9FT ADLT (ELECTROSURGICAL) ×2
ELECTRODE REM PT RTRN 9FT ADLT (ELECTROSURGICAL) ×1 IMPLANT
FACESHIELD WRAPAROUND (MASK) ×8 IMPLANT
FACESHIELD WRAPAROUND OR TEAM (MASK) ×4 IMPLANT
GAUZE SPONGE 4X4 12PLY STRL (GAUZE/BANDAGES/DRESSINGS) ×1 IMPLANT
GAUZE XEROFORM 1X8 LF (GAUZE/BANDAGES/DRESSINGS) IMPLANT
GLOVE BIO SURGEON STRL SZ7.5 (GLOVE) ×2 IMPLANT
GLOVE BIOGEL PI IND STRL 7.0 (GLOVE) IMPLANT
GLOVE BIOGEL PI IND STRL 7.5 (GLOVE) IMPLANT
GLOVE BIOGEL PI IND STRL 8 (GLOVE) ×2 IMPLANT
GLOVE BIOGEL PI INDICATOR 7.0 (GLOVE) ×1
GLOVE BIOGEL PI INDICATOR 7.5 (GLOVE) ×1
GLOVE BIOGEL PI INDICATOR 8 (GLOVE) ×2
GLOVE ECLIPSE 8.0 STRL XLNG CF (GLOVE) ×2 IMPLANT
GLOVE SURG SS PI 7.0 STRL IVOR (GLOVE) ×1 IMPLANT
GLOVE SURG SS PI 7.5 STRL IVOR (GLOVE) ×1 IMPLANT
GOWN STRL REUS W/TWL LRG LVL3 (GOWN DISPOSABLE) ×1 IMPLANT
GOWN STRL REUS W/TWL XL LVL3 (GOWN DISPOSABLE) ×5 IMPLANT
HANDPIECE INTERPULSE COAX TIP (DISPOSABLE) ×2
KIT BASIN OR (CUSTOM PROCEDURE TRAY) ×2 IMPLANT
LEGGING LITHOTOMY PAIR STRL (DRAPES) ×2 IMPLANT
MANIFOLD NEPTUNE II (INSTRUMENTS) ×2 IMPLANT
PACK BLADE SAW RECIP 70 3 PT (BLADE) ×1 IMPLANT
PACK TOTAL JOINT (CUSTOM PROCEDURE TRAY) ×2 IMPLANT
PADDING CAST COTTON 6X4 STRL (CAST SUPPLIES) ×1 IMPLANT
PEN SKIN MARKING BROAD (MISCELLANEOUS) ×2 IMPLANT
SET HNDPC FAN SPRY TIP SCT (DISPOSABLE) ×1 IMPLANT
STAPLER VISISTAT 35W (STAPLE) IMPLANT
STRIP CLOSURE SKIN 1/2X4 (GAUZE/BANDAGES/DRESSINGS) ×1 IMPLANT
SUCTION FRAZIER 12FR DISP (SUCTIONS) ×2 IMPLANT
SUT MNCRL AB 4-0 PS2 18 (SUTURE) IMPLANT
SUT VIC AB 0 CT1 36 (SUTURE) ×1 IMPLANT
SUT VIC AB 1 CT1 36 (SUTURE) ×2 IMPLANT
SUT VIC AB 2-0 CT1 27 (SUTURE) ×4
SUT VIC AB 2-0 CT1 TAPERPNT 27 (SUTURE) ×1 IMPLANT
TOWEL OR 17X26 10 PK STRL BLUE (TOWEL DISPOSABLE) ×2 IMPLANT
TOWEL OR NON WOVEN STRL DISP B (DISPOSABLE) ×2 IMPLANT
TRAY FOLEY W/METER SILVER 14FR (SET/KITS/TRAYS/PACK) ×2 IMPLANT
WRAP KNEE MAXI GEL POST OP (GAUZE/BANDAGES/DRESSINGS) ×2 IMPLANT
YANKAUER SUCT BULB TIP NO VENT (SUCTIONS) ×2 IMPLANT

## 2014-10-02 NOTE — H&P (Signed)
TOTAL KNEE ADMISSION H&P  Patient is being admitted for right partial knee arthroplasty.  Subjective:  Chief Complaint:right knee pain.  HPI: Kelly [name], 69 y.o. female, has a history of pain and functional disability in the right knee due to arthritis and has failed non-surgical conservative treatments for greater than 12 weeks to includeNSAID's and/or analgesics, corticosteriod injections, viscosupplementation injections, flexibility and strengthening excercises, use of assistive devices, weight reduction as appropriate and activity modification.  Onset of symptoms was gradual, starting 5 years ago with gradually worsening course since that time. The patient noted no past surgery on the right knee(s).  Patient currently rates pain in the right knee(s) at 10 out of 10 with activity. Patient has night pain, worsening of pain with activity and weight bearing, pain that interferes with activities of daily living and pain with passive range of motion.  Patient has evidence of joint space narrowing of the medial compartment by imaging studies. There is no active infection.  Patient Active Problem List   Diagnosis Date Noted  . Osteoarthritis of right knee medial compartment 10/02/2014  . S/P left unicompartmental knee replacement 11/06/2011  . Constipation due to pain medication 11/06/2011  . Anxiety and depression 11/06/2011  . Hypertension 10/13/2010  . Hyperlipidemia 10/13/2010  . Diabetes mellitus 10/13/2010  . Renal insufficiency 10/13/2010  . Osteoarthritis 10/13/2010  . UNSPECIFIED IRON DEFICIENCY ANEMIA 04/11/2010  . DIARRHEA 04/11/2010  . PERSONAL HISTORY OF FAILED MODERATE SEDATION 04/11/2010   Past Medical History  Diagnosis Date  . Hyperlipidemia   . Hypertension   . Arthritis   . Anemia   . Diabetes mellitus     not on meds last Aic in [facility] 04/20/11   . Renal insufficiency, mild     CKD- stage I per office visit note of PCP   . Depression     Past Surgical History   Procedure Laterality Date  . Abdominal hysterectomy    . Cholecystectomy    . Facial cosmetic surgery  2/10  . Knee arthroscopy  2007    left & right  . Liposuction extremities      thighs  . Breast reduction surgery      and lift  . Carpal tunnel release  05/11    right  . Trigger finger release  5/11    right 4th finger  . Total knee revision  10/12/2011    Procedure: TOTAL KNEE REVISION;  Surgeon: Mcarthur Rossetti, MD;  Location: [facility] ORS;  Service: Orthopedics;  Laterality: Left;  Left Total Knee Revision Arthroplasty    No prescriptions prior to admission   Allergies  Allergen Reactions  . Penicillins Hives    Had to use an epi pen    Social History  Substance Use Topics  . Smoking status: Never Smoker   . Smokeless tobacco: Never Used  . Alcohol Use: 8.4 oz/week    14 Glasses of wine per week     Comment: daily 1- 2    Family History  Problem Relation Age of Onset  . Heart disease Mother   . Alcohol abuse Father   . Hypertension Father   . Cancer Sister   . Hypertension Sister      Review of Systems  Musculoskeletal: Positive for joint pain.  All other systems reviewed and are negative.   Objective:  Physical Exam  Constitutional: She is oriented to person, place, and time. She appears well-developed and well-nourished.  HENT:  Head: Normocephalic and atraumatic.  Eyes: EOM  are normal. Pupils are equal, round, and reactive to light.  Neck: Normal range of motion. Neck supple.  Cardiovascular: Normal rate and regular rhythm.   Respiratory: Effort normal and breath sounds normal.  GI: Soft. Bowel sounds are normal.  Musculoskeletal:       Right knee: She exhibits swelling. Tenderness found. Medial joint line tenderness noted.  Neurological: She is alert and oriented to person, place, and time.  Skin: Skin is warm and dry.  Psychiatric: She has a normal mood and affect.    Vital signs in last 24 hours:    Labs:   Estimated body mass index  is 36.77 kg/(m^2) as calculated from the following:   Height as of 12/25/13: 5\' 1"  (1.549 m).   Weight as of 08/03/14: 88.225 kg (194 lb 8 oz).   Imaging Review Plain radiographs demonstrate severe degenerative joint disease of the right knee medial compartment. The overall alignment ismild varus. The bone quality appears to be good for age and reported activity level.  Assessment/Plan:  End stage arthritis, right knee   The patient history, physical examination, clinical judgment of the provider and imaging studies are consistent with end stage degenerative joint disease of the right knee(s) and a partial knee arthroplasty is deemed medically necessary. The treatment options including medical management, injection therapy arthroscopy and arthroplasty were discussed at length. The risks and benefits of total knee arthroplasty were presented and reviewed. The risks due to aseptic loosening, infection, stiffness, patella tracking problems, thromboembolic complications and other imponderables were discussed. The patient acknowledged the explanation, agreed to proceed with the plan and consent was signed. Patient is being admitted for inpatient treatment for surgery, pain control, PT, OT, prophylactic antibiotics, VTE prophylaxis, progressive ambulation and ADL's and discharge planning. The patient is planning to be discharged home with home health services

## 2014-10-02 NOTE — Brief Op Note (Signed)
10/02/2014  12:28 PM  PATIENT:  Randalyn Rhea  69 y.o. female  PRE-OPERATIVE DIAGNOSIS:  Right knee medial compartment arthritis  POST-OPERATIVE DIAGNOSIS:  Right knee medial compartment arthritis  PROCEDURE:  Procedure(s): RIGHT KNEE MEDIAL UNICOMPARTMENTAL ARTHROPLASTY (Right)  SURGEON:  Surgeon(s) and Role:    * Mcarthur Rossetti, MD - Primary  PHYSICIAN ASSISTANT: Benita Stabile, PA-C  ANESTHESIA:   spinal  EBL:  Total I/O In: 1000 [I.V.:1000] Out: 150 [Urine:150]  BLOOD ADMINISTERED:none  DRAINS: none   LOCAL MEDICATIONS USED:  NONE  SPECIMEN:  No Specimen  DISPOSITION OF SPECIMEN:  N/A  COUNTS:  YES  TOURNIQUET:  * Missing tourniquet times found for documented tourniquets in log:  [id number] *  DICTATION: .Other Dictation: Dictation Number [id number]  PLAN OF CARE:   Admit to inpatient  PATIENT DISPOSITION:  PACU - hemodynamically stable.   Delay start of Pharmacological VTE agent (>24hrs) due to surgical blood loss or risk of bleeding: no

## 2014-10-02 NOTE — Anesthesia Postprocedure Evaluation (Signed)
  Anesthesia Post-op Note  Patient: Kelly [name]  Procedure(s) Performed: Procedure(s) (LRB): RIGHT KNEE MEDIAL UNICOMPARTMENTAL ARTHROPLASTY (Right)  Patient Location: PACU  Anesthesia Type: Spinal  Level of Consciousness: awake and alert   Airway and Oxygen Therapy: Patient Spontanous Breathing  Post-op Pain: mild  Post-op Assessment: Post-op Vital signs reviewed, Patient's Cardiovascular Status Stable, Respiratory Function Stable, Patent Airway and No signs of Nausea or vomiting. Spinal is receding.  Last Vitals:  Filed Vitals:   10/02/14 1551  BP: 151/81  Pulse: 64  Temp: 36.4 C  Resp: 20    Post-op Vital Signs: stable   Complications: No apparent anesthesia complications

## 2014-10-02 NOTE — Progress Notes (Signed)
X-ray results noted 

## 2014-10-02 NOTE — Progress Notes (Signed)
Portable AP and Lateral Right Knee X-rays done. 

## 2014-10-02 NOTE — Anesthesia Procedure Notes (Signed)
Spinal Patient location during procedure: OR End time: 10/02/2014 11:02 AM Staffing Resident/CRNA: Noralyn Pick D Performed by: anesthesiologist  Preanesthetic Checklist Completed: patient identified, site marked, surgical consent, pre-op evaluation, timeout performed, IV checked, risks and benefits discussed and monitors and equipment checked Spinal Block Patient position: sitting Prep: Betadine Patient monitoring: heart rate, continuous pulse ox and blood pressure Approach: midline Location: L3-4 Injection technique: single-shot Needle Needle type: Sprotte  Needle gauge: 24 G Needle length: 9 cm Additional Notes Expiration date of kit checked and confirmed. Patient tolerated procedure well, without complications.

## 2014-10-02 NOTE — Anesthesia Preprocedure Evaluation (Addendum)
Anesthesia Evaluation  Patient identified by MRN, date of birth, ID band Patient awake    Reviewed: Allergy & Precautions, NPO status , Patient's Chart, lab work & pertinent test results  Airway Mallampati: II  TM Distance: >3 FB Neck ROM: Full    Dental no notable dental hx.    Pulmonary neg pulmonary ROS,  breath sounds clear to auscultation  Pulmonary exam normal       Cardiovascular Exercise Tolerance: Good hypertension, Pt. on medications Normal cardiovascular examRhythm:Regular Rate:Normal     Neuro/Psych PSYCHIATRIC DISORDERS Depression negative neurological ROS     GI/Hepatic negative GI ROS, Neg liver ROS,   Endo/Other  diabetes, Type 2, Oral Hypoglycemic AgentsPatient denies knowledge of diabetes. No meds.  Renal/GU Renal InsufficiencyRenal diseaseCr 1.2 K 4.3  negative genitourinary   Musculoskeletal  (+) Arthritis -,   Abdominal (+) + obese,   Peds negative pediatric ROS (+)  Hematology  (+) anemia ,   Anesthesia Other Findings   Reproductive/Obstetrics negative OB ROS                          Anesthesia Physical Anesthesia Plan  ASA: III  Anesthesia Plan: Spinal   Post-op Pain Management:    Induction: Intravenous  Airway Management Planned: Natural Airway  Additional Equipment:   Intra-op Plan:   Post-operative Plan:   Informed Consent: I have reviewed the patients History and Physical, chart, labs and discussed the procedure including the risks, benefits and alternatives for the proposed anesthesia with the patient or authorized representative who has indicated his/her understanding and acceptance.   Dental advisory given  Plan Discussed with: CRNA  Anesthesia Plan Comments: (Discussed risks and benefits of and differences between spinal and general. Discussed risks of spinal including headache, backache, failure, bleeding and hematoma, infection, and nerve  damage and paralysis. Patient consents to spinal. Questions answered. Coagulation studies and platelet count acceptable. )       Anesthesia Quick Evaluation

## 2014-10-02 NOTE — Transfer of Care (Signed)
Immediate Anesthesia Transfer of Care Note  Patient: Kelly [name]  Procedure(s) Performed: Procedure(s): RIGHT KNEE MEDIAL UNICOMPARTMENTAL ARTHROPLASTY (Right)  Patient Location: PACU  Anesthesia Type:Spinal  Level of Consciousness: sedated  Airway & Oxygen Therapy: Patient Spontanous Breathing and Patient connected to face mask oxygen  Post-op Assessment: Report given to RN and Post -op Vital signs reviewed and stable  Post vital signs: Reviewed and stable  Last Vitals:  Filed Vitals:   10/02/14 0814  BP: 148/92  Pulse: 71  Temp: 36.6 C  Resp: 18    Complications: No apparent anesthesia complications

## 2014-10-02 NOTE — Op Note (Signed)
NAMEMarland Kitchen  Kelly [name]             ACCOUNT NO.:  [id number]  MEDICAL RECORD NO.:  [id number]  LOCATION:  [place]                         FACILITY:  [facility]  PHYSICIAN:  Kelly [name], M.D.DATE OF BIRTH:  1945-08-13  DATE OF PROCEDURE:  10/02/2014 DATE OF DISCHARGE:                              OPERATIVE REPORT   PREOPERATIVE DIAGNOSIS:  Right knee medial compartment arthritis.  POSTOPERATIVE DIAGNOSIS:  Right knee medial compartment arthritis.  PROCEDURE:  Right unicompartmental partial knee arthroplasty.  IMPLANTS:  Biomet Oxford knee with small femur, size small B right medial tibial tray, size 4 polyethylene insert.  SURGEON:  Kelly [name], M.D.  ASSISTANT:  Erskine Emery, PA-C.  ANESTHESIA:  Spinal.  ANTIBIOTICS:  900 mg of IV clindamycin.  TOURNIQUET TIME:  Less than 1 hour.  BLOOD LOSS:  Minimal.  COMPLICATIONS:  None.  INDICATIONS:  Kelly [name] is a 69 year old female with known medial compartment arthritis of her right knee and a varus deformity, does correct with valgus stressing, she has intact ACL and PCL, minimal patellofemoral arthritic changes and no lateral compartment pain.  She struggled with total knee arthroplasty on her left side and wishes for only a partially knee replacement for pain control purposes on the right.  I believe this is the right surgery for her, we talked in detail about the risks and benefits of surgery and she does wish to proceed.  PROCEDURE DESCRIPTION:  After informed consent was obtained, appropriate right knee was marked.  She was brought to the operating room where spinal anesthesia was obtained.  She was then laid in a supine position with her right operative leg placed in a special stirrup with protecting at the popliteal area.  There was a nonsterile tourniquet was placed as well.  Foley catheter was placed as well as her left nonoperative leg and an appropriate stirrup with padding.  Her right operative  leg was then prepped and draped with DuraPrep and sterile drapes including a sterile stockinette.  Time-out was called and she was identified as correct patient and correct right knee.  I then used an Esmarch to wrap out the leg and tourniquet was inflated to 300 mm of pressure.  I then made an incision from the medial superior part of the patella down the medial aspect of the knee to the tibial tubercle performing a medial parapatellar arthrotomy finding a large joint effusion, meniscal tearing and complete loss of the medial compartment cartilage.  We removed remnants of the medial meniscus and removed osteophytes from the medial compartment.  We then set our tibial cutting guide, referencing of a small and correcting for varus and valgus and our slope.  We made this cut without difficulty using the sagittal saw and  reciprocating saw removing from top of her tibia.  We then sized for size B right small tibial tray.  We then went to the femur and repaired our femur with a 0 Mill.  We put our small femur, component in place and then trialed a 4- mm polyethylene insert in flexion and went up to extension and a 1-mm so we felt that milling to a 3 would be appropriate, which we did.  We then placed  the trial component in place and repaired our tibia as well.  We also made our posterior femoral cut.  After we prepared the keel for the tibia, went back to the femoral component, put this on and we trialed with a 4-mm polyethylene insert in flexion and extension and we were pleased with the stability and the range of motion.  We then removed all trial components, removed all osteophytes from the knee as well as using the impingement device.  We then mixed our cement and after irrigating the knee with normal saline solution, mixture of cement and cemented the real Biomet Oxford knee tibial tray followed by the real femur and placed a 4-mm polyethylene insert.  Once the cement had hardened,  we then irrigated the knee again with normal saline solution and placed the real polyethylene.  We then closed the arthrotomy with interrupted #1 Vicryl suture followed by 0 Vicryl in the deep tissue, 2-0 Vicryl in the subcutaneous tissue and interrupted 4-0 Monocryl in the skin.  Well- padded Steri-Strips and well-padded sterile dressing were applied.  She was taken to the recovery room in stable condition.  All final counts were correct.  There were no complications noted.  Of note, Erskine Emery, PA-C assisted the entire case and his assistance was crucial for facilitating all aspects of this case.     Kelly [name], M.D.     CYB/MEDQ  D:  10/02/2014  T:  10/02/2014  Job:  [id number]

## 2014-10-03 DIAGNOSIS — I129 Hypertensive chronic kidney disease with stage 1 through stage 4 chronic kidney disease, or unspecified chronic kidney disease: Secondary | ICD-10-CM | POA: Diagnosis present

## 2014-10-03 DIAGNOSIS — Z809 Family history of malignant neoplasm, unspecified: Secondary | ICD-10-CM | POA: Diagnosis not present

## 2014-10-03 DIAGNOSIS — Z8249 Family history of ischemic heart disease and other diseases of the circulatory system: Secondary | ICD-10-CM | POA: Diagnosis not present

## 2014-10-03 DIAGNOSIS — Z6836 Body mass index (BMI) 36.0-36.9, adult: Secondary | ICD-10-CM | POA: Diagnosis not present

## 2014-10-03 DIAGNOSIS — M1711 Unilateral primary osteoarthritis, right knee: Secondary | ICD-10-CM | POA: Diagnosis present

## 2014-10-03 DIAGNOSIS — N181 Chronic kidney disease, stage 1: Secondary | ICD-10-CM | POA: Diagnosis present

## 2014-10-03 DIAGNOSIS — F329 Major depressive disorder, single episode, unspecified: Secondary | ICD-10-CM | POA: Diagnosis present

## 2014-10-03 DIAGNOSIS — M25461 Effusion, right knee: Secondary | ICD-10-CM | POA: Diagnosis present

## 2014-10-03 DIAGNOSIS — E1122 Type 2 diabetes mellitus with diabetic chronic kidney disease: Secondary | ICD-10-CM | POA: Diagnosis present

## 2014-10-03 DIAGNOSIS — M25761 Osteophyte, right knee: Secondary | ICD-10-CM | POA: Diagnosis present

## 2014-10-03 DIAGNOSIS — Z9283 Personal history of failed moderate sedation: Secondary | ICD-10-CM | POA: Diagnosis not present

## 2014-10-03 DIAGNOSIS — Z01812 Encounter for preprocedural laboratory examination: Secondary | ICD-10-CM | POA: Diagnosis not present

## 2014-10-03 DIAGNOSIS — E785 Hyperlipidemia, unspecified: Secondary | ICD-10-CM | POA: Diagnosis present

## 2014-10-03 DIAGNOSIS — M25561 Pain in right knee: Secondary | ICD-10-CM | POA: Diagnosis not present

## 2014-10-03 DIAGNOSIS — E669 Obesity, unspecified: Secondary | ICD-10-CM | POA: Diagnosis present

## 2014-10-03 DIAGNOSIS — F419 Anxiety disorder, unspecified: Secondary | ICD-10-CM | POA: Diagnosis present

## 2014-10-03 DIAGNOSIS — Z01810 Encounter for preprocedural cardiovascular examination: Secondary | ICD-10-CM | POA: Diagnosis not present

## 2014-10-03 DIAGNOSIS — Z880 Allergy status to penicillin: Secondary | ICD-10-CM | POA: Diagnosis not present

## 2014-10-03 NOTE — Progress Notes (Signed)
OT Cancellation Note  Patient Details Name: PADDY WALTHALL MRN: [id number] DOB: [birth year]   Cancelled Treatment:    Reason Eval/Treat Not Completed: Other (comment).  Pt with increased pain and didn't sleep well.  Will check back tomorrow am.  Emilie Carp 10/03/2014, 1:56 PM  Lesle Chris, OTR/L [phone] 10/03/2014

## 2014-10-03 NOTE — Progress Notes (Signed)
Subjective: 1 Day Post-Op Procedure(s) (LRB): RIGHT KNEE MEDIAL UNICOMPARTMENTAL ARTHROPLASTY (Right) Patient reports pain as moderate.    Objective: Vital signs in last 24 hours: Temp:  [97.5 F (36.4 C)-98.3 F (36.8 C)] 97.8 F (36.6 C) (08/20 0610) Pulse Rate:  [59-78] 59 (08/20 0610) Resp:  [12-22] 16 (08/20 0610) BP: (97-151)/(60-86) 125/75 mmHg (08/20 0610) SpO2:  [95 %-100 %] 100 % (08/20 0610)  Intake/Output from previous day: 08/19 0701 - 08/20 0700 In: 4076.3 [P.O.:920; I.V.:3001.3; IV Piggyback:155] Out: 2225 [Urine:2225] Intake/Output this shift: Total I/O In: 240 [P.O.:240] Out: 50 [Urine:50]  No results for input(s): HGB in the last 72 hours. No results for input(s): WBC, RBC, HCT, PLT in the last 72 hours. No results for input(s): NA, K, CL, CO2, BUN, CREATININE, GLUCOSE, CALCIUM in the last 72 hours. No results for input(s): LABPT, INR in the last 72 hours.  Sensation intact distally Intact pulses distally Dorsiflexion/Plantar flexion intact Incision: scant drainage No cellulitis present Compartment soft  Assessment/Plan: 1 Day Post-Op Procedure(s) (LRB): RIGHT KNEE MEDIAL UNICOMPARTMENTAL ARTHROPLASTY (Right) Up with therapy Discharge home with home health Monday  BLACKMAN,CHRISTOPHER Y 10/03/2014, 10:00 AM

## 2014-10-03 NOTE — Progress Notes (Signed)
Physical Therapy Treatment Patient Details Name: Kelly [name] MRN: [id number] DOB: 1945-05-01 Today's Date: 10/29/2014    History of Present Illness R UKR; s/p L TKR with multiple revisions    PT Comments    Pt motivated and progressing steadily with mobility.  Follow Up Recommendations  SNF     Equipment Recommendations  Rolling walker with 5" wheels    Recommendations for Other Services OT consult     Precautions / Restrictions Precautions Precautions: Knee;Fall Restrictions Weight Bearing Restrictions: No Other Position/Activity Restrictions: WBAT    Mobility  Bed Mobility Overal bed mobility: Needs Assistance Bed Mobility: Supine to Sit;Sit to Supine     Supine to sit: Min guard Sit to supine: Min guard   General bed mobility comments: cues for sequence and use of L LE to self assist  Transfers Overall transfer level: Needs assistance Equipment used: Rolling walker (2 wheeled) Transfers: Sit to/from Stand Sit to Stand: Min guard         General transfer comment: cues for LE management and use of UEs to self assist  Ambulation/Gait Ambulation/Gait assistance: Min assist;Min guard Ambulation Distance (Feet): 150 Feet (and 19' to bathroom) Assistive device: Rolling walker (2 wheeled) Gait Pattern/deviations: Step-to pattern;Decreased step length - right;Decreased step length - left;Shuffle;Trunk flexed Gait velocity: decr   General Gait Details: cues for sequence, posture and position from [name]            Wheelchair Mobility    Modified Rankin (Stroke Patients Only)       Balance                                    Cognition Arousal/Alertness: Awake/alert Behavior During Therapy: WFL for tasks assessed/performed Overall Cognitive Status: Within Functional Limits for tasks assessed                      Exercises      General Comments        Pertinent Vitals/Pain Pain Assessment: 0-10 Pain  Score: 4  Pain Location: R knee Pain Descriptors / Indicators: Aching;Sore Pain Intervention(s): Limited activity within patient's tolerance;Monitored during session;Premedicated before session;Ice applied    Home Living                      Prior Function            PT Goals (current goals can now be found in the care plan section) Acute Rehab PT Goals 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

## 2014-10-03 NOTE — Care Management Note (Signed)
Case Management Note  Patient Details  Name: Kelly [name] MRN: [id number] Date of Birth: December 09, 1945  Subjective/Objective:                  Partial Knee Replacement  Action/Plan: [place]  Expected Discharge Date:    10/05/14              Expected Discharge Plan:  [place]  In-House Referral:     Discharge planning Services  CM Consult  Post Acute Care Choice:  Home Health Choice offered to:  Patient  DME Arranged:  N/A DME Agency:     HH Arranged:  PT HH Agency:  [place]  Status of Service:  In process, will continue to follow  Medicare Important Message Given:    Date Medicare IM Given:    Medicare IM give by:    Date Additional Medicare IM Given:    Additional Medicare Important Message give by:     If discussed at [place] of Stay Meetings, dates discussed:    Additional Comments: CM spoke with patient at the bedside. Patient reports she planned to go to [facility] for rehab but was informed today her insurance will not cover it due to her surgery being a partial knee replacement. [facility] was pre-operatively arranged for HHPT. Patient has a 3N1 and RW at home. She lives alone and is concerned about being there after discharge without assistance.  Apolonio Schneiders, RN 10/03/2014, 4:47 PM

## 2014-10-03 NOTE — Evaluation (Signed)
Physical Therapy Evaluation Patient Details Name: Kelly [name] MRN: [id number] DOB: 01/09/1946 Today's Date: 10/03/2014   History of Present Illness  R UKR; s/p L TKR with multiple revisions  Clinical Impression  Pt s/p R UKR presents with decreased R LE strength/ROM and post op pain limiting functional mobility.  Pt would benefit from follow up rehab at SNF level to maximize IND and safety prior to return home ALONE.   Follow Up Recommendations SNF    Equipment Recommendations  Rolling walker with 5" wheels    Recommendations for Other Services OT consult     Precautions / Restrictions Precautions Precautions: Knee;Fall Restrictions Weight Bearing Restrictions: No Other Position/Activity Restrictions: WBAT      Mobility  Bed Mobility Overal bed mobility: Needs Assistance Bed Mobility: Supine to Sit     Supine to sit: Min assist     General bed mobility comments: cues for sequence and use of L LE to self assist  Transfers Overall transfer level: Needs assistance Equipment used: Rolling walker (2 wheeled) Transfers: Sit to/from Stand Sit to Stand: Min assist         General transfer comment: cues for LE management and use of UEs to self assist  Ambulation/Gait Ambulation/Gait assistance: Min assist Ambulation Distance (Feet): 19 Feet (twice) Assistive device: Rolling walker (2 wheeled) Gait Pattern/deviations: Step-to pattern;Decreased step length - right;Decreased step length - left;Shuffle;Trunk flexed Gait velocity: decr   General Gait Details: cues for sequence, posture and position from [employer]            Wheelchair Mobility    Modified Rankin (Stroke Patients Only)       Balance                                             Pertinent Vitals/Pain Pain Assessment: 0-10 Pain Score: 4  Pain Location: R knee Pain Descriptors / Indicators: Aching;Sore Pain Intervention(s): Limited activity within patient's  tolerance;Monitored during session;Premedicated before session;Ice applied    Home Living Family/patient expects to be discharged to:: Skilled nursing facility Living Arrangements: Alone                    Prior Function Level of Independence: Independent               Hand Dominance        Extremity/Trunk Assessment   Upper Extremity Assessment: Overall WFL for tasks assessed           Lower Extremity Assessment: RLE deficits/detail;LLE deficits/detail RLE Deficits / Details: 3/5 quads with pt performing IND SLR;   AAROM -10 - 40 LLE Deficits / Details: Strength WFL with AROM to ~50 flex  Cervical / Trunk Assessment: Normal  Communication   Communication: No difficulties  Cognition Arousal/Alertness: Awake/alert Behavior During Therapy: WFL for tasks assessed/performed Overall Cognitive Status: Within Functional Limits for tasks assessed                      General Comments      Exercises Total Joint Exercises Ankle Circles/Pumps: AROM;Both;15 reps;Supine Quad Sets: AROM;Both;10 reps;Supine Heel Slides: AAROM;Right;15 reps;Supine Straight Leg Raises: AAROM;AROM;Right;15 reps;Supine      Assessment/Plan    PT Assessment Patient needs continued PT services  PT Diagnosis Difficulty walking   PT Problem List Decreased strength;Decreased range of motion;Decreased activity tolerance;Decreased mobility;Decreased knowledge of  use of DME;Pain  PT Treatment Interventions DME instruction;Gait training;Stair training;Functional mobility training;Therapeutic activities;Therapeutic exercise;Patient/family education   PT Goals (Current goals can be found in the Care Plan section) Acute Rehab PT Goals 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 Goal Formulation: With patient Time For Goal Achievement: 10/10/14 Potential to Achieve Goals: Good    Frequency 7X/week   Barriers to discharge Decreased caregiver support No  assist at home    Co-evaluation               End of Session Equipment Utilized During Treatment: Gait belt Activity Tolerance: Patient tolerated treatment well Patient left: in chair;with call bell/phone within reach Nurse Communication: Mobility status    Functional Assessment Tool Used: clinical judgement Functional Limitation: Mobility: Walking and moving around Mobility: Walking and Moving Around Current Status (H1505): At least 20 percent but less than 40 percent impaired, limited or restricted Mobility: Walking and Moving Around Goal Status [phone]): At least 1 percent but less than 20 percent impaired, limited or restricted    Time: 0819-0852 PT Time Calculation (min) (ACUTE ONLY): 33 min   Charges:   PT Evaluation $Initial PT Evaluation Tier I: 1 Procedure PT Treatments $Therapeutic Exercise: 8-22 mins   PT G Codes:   PT G-Codes **NOT FOR INPATIENT CLASS** Functional Assessment Tool Used: clinical judgement Functional Limitation: Mobility: Walking and moving around Mobility: Walking and Moving Around Current Status (I0165): At least 20 percent but less than 40 percent impaired, limited or restricted Mobility: Walking and Moving Around Goal Status [phone]): At least 1 percent but less than 20 percent impaired, limited or restricted    Kelly [name] 10/03/2014, 10:06 AM

## 2014-10-04 LAB — GLUCOSE, CAPILLARY: GLUCOSE-CAPILLARY: 84 mg/dL (ref 65–99)

## 2014-10-04 NOTE — Discharge Instructions (Signed)

## 2014-10-04 NOTE — Progress Notes (Signed)
Physical Therapy Treatment Patient Details Name: Kelly [name] MRN: [id number] DOB: 26-Apr-1945 Today's Date: 10/04/2014    History of Present Illness R UKR; s/p L TKR with multiple revisions    PT Comments    Pt continues motivated but ltd this pm by fatigue and pain level.  Follow Up Recommendations  SNF;Home health PT (dependent on acute stay progress and assist level at home)     Equipment Recommendations  Rolling walker with 5" wheels    Recommendations for Other Services OT consult     Precautions / Restrictions Precautions Precautions: Knee;Fall Restrictions Weight Bearing Restrictions: No Other Position/Activity Restrictions: WBAT    Mobility  Bed Mobility Overal bed mobility: Needs Assistance Bed Mobility: Supine to Sit;Sit to Supine     Supine to sit: Supervision Sit to supine: Min assist   General bed mobility comments: pt crossed LLE under R ankle to assist but still required assist 2* increased pain  Transfers Overall transfer level: Needs assistance Equipment used: Rolling walker (2 wheeled) Transfers: Sit to/from Stand Sit to Stand: Supervision         General transfer comment: for safety; pt cued herself for hand placement  Ambulation/Gait Ambulation/Gait assistance: Min guard;Supervision Ambulation Distance (Feet): 40 Feet Assistive device: Rolling walker (2 wheeled) Gait Pattern/deviations: Step-to pattern;Decreased step length - right;Decreased step length - left;Shuffle;Trunk flexed Gait velocity: decr   General Gait Details: cues for posture and position from RW.  Multiple rests 2* nausea to complete task   Stairs            Wheelchair Mobility    Modified Rankin (Stroke Patients Only)       Balance                                    Cognition Arousal/Alertness: Awake/alert Behavior During Therapy: WFL for tasks assessed/performed Overall Cognitive Status: Within Functional Limits for tasks  assessed                      Exercises Total Joint Exercises Ankle Circles/Pumps: AROM;Both;15 reps;Supine Quad Sets: AROM;Both;10 reps;Supine Heel Slides: AAROM;Right;Supine;10 reps Straight Leg Raises: AAROM;Right;Supine;10 reps    General Comments        Pertinent Vitals/Pain Pain Assessment: 0-10 Pain Score: 6  Pain Location: R knee Pain Descriptors / Indicators: Aching;Sore Pain Intervention(s): Premedicated before session;Monitored during session;Limited activity within patient's tolerance;Ice applied    Home Living                      Prior Function            PT Goals (current goals can now be found in the care plan section) Acute Rehab PT Goals 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 Goal Formulation: With patient Time For Goal Achievement: 10/10/14 Potential to Achieve Goals: Good Progress towards PT goals: Progressing toward goals    Frequency  7X/week    PT Plan Discharge plan needs to be updated    Co-evaluation             End of Session Equipment Utilized During Treatment: Gait belt Activity Tolerance: No increased pain;Patient limited by fatigue Patient left: in bed;with call bell/phone within reach     Time: 2774-1287 PT Time Calculation (min) (ACUTE ONLY): 27 min  Charges:  $Gait Training: 8-22 mins $Therapeutic Exercise: 8-22 mins  G Codes:      [name] 2014-10-26, 3:32 PM

## 2014-10-04 NOTE — Clinical Social Work Note (Addendum)
Clinical Social Work Assessment  Patient Details  Name: Kelly [name] MRN: [id number] Date of Birth: 08-Apr-1945  Date of referral:  10/04/14               Reason for consult:  Facility Placement                Permission sought to share information with:  Facility Art therapist granted to share information::  Yes, Verbal Permission Granted  Name::        Agency::     Relationship::     Contact Information:     Housing/Transportation Living arrangements for the past 2 months:  Single Family Home Source of Information:  Patient Patient Interpreter Needed:    Criminal Activity/Legal Involvement Pertinent to Current Situation/Hospitalization:    Significant Relationships:  Siblings Lives with:  Self Do you feel safe going back to the place where you live?    Need for family participation in patient care:     Care giving concerns:  No caregiver   Social Worker assessment / plan:  CSW met with pt to discuss discharge plans.  CSW prompted pt to discuss her history, home life and current needs.  CSW provided active and supportive listening and explained SNF protocol.  CSW encouraged pt to explore her thoughts and feelings relatd to her diagnoses and rehab needs.  CSW will provide information for pt discharge needs.  Weekday CSW will monitor in pt status and if pt has her 3 overnights will look to get authorization for SNF at discharge  Employment status:  Retired Forensic scientist:  Medicare, Other (Comment Required) (blue cross federal supplement) PT Recommendations:  [place] / Referral to community resources:     Patient/Family's Response to care:  Pt stated that she had been pre registered at [facility] and was planning for rehab there but has  been told her rehab needs will not being covered under her insurance.  Pt discussed living alone and not having any help at home.  Pt's sister currently has her walker and she is concerned  about this if she goes home.  Pt stated that she should not have to pay out of pocket for SNF if needed.  Pt appreciative of hospital staff helping to get her rehab needs met  Patient/Family's Understanding of and Emotional Response to Diagnosis, Current Treatment, and Prognosis:  Pt understands her diagnoses and feels that she should be covered for her SNF needs.  Pt worried about what she will do at discharge if her 3 days are not met.  Pt grateful for help from CSW in trying to obtain authorization for SNF if she stays at hospital for her 3 over night stays.  Emotional Assessment Appearance:  Appears younger than stated age Attitude/Demeanor/Rapport:  Apprehensive Affect (typically observed):  Accepting Orientation:  Oriented to Self, Oriented to Place, Oriented to  Time, Oriented to Situation Alcohol / Substance use:    Psych involvement (Current and /or in the community):     Discharge Needs  Concerns to be addressed:    Readmission within the last 30 days:    Current discharge risk:    Barriers to Discharge:      Carlean Jews, LCSW 10/04/2014, 5:00 PM

## 2014-10-04 NOTE — Evaluation (Signed)
Occupational Therapy Evaluation Patient Details Name: Kelly [name] MRN: [id number] DOB: [birth year] Today's Date: 10/04/2014    History of Present Illness R UKR; s/p L TKR with multiple revisions   Clinical Impression   This 69 year old female was admitted for the above surgery. She is at home alone and would benefit from continued OT services.  Will address BADLs with mod I level goals in acute. Recommend SNF vs HHOT for IADLs.    Follow Up Recommendations  SNF;Home health OT (if home)    Equipment Recommendations  None recommended by OT    Recommendations for Other Services       Precautions / Restrictions Precautions Precautions: Knee;Fall Restrictions Other Position/Activity Restrictions: WBAT      Mobility Bed Mobility         Supine to sit: Supervision Sit to supine: Min guard   General bed mobility comments: pt crossed LLE under R ankle to assist  Transfers   Equipment used: Rolling walker (2 wheeled) Transfers: Sit to/from Stand Sit to Stand: Min guard         General transfer comment: for safety; pt cued herself for hand placement    Balance                                            ADL Overall ADL's : Needs assistance/impaired     Grooming: Min guard;Standing (without use of hands to put earrings in)   Upper Body Bathing: Set up;Sitting   Lower Body Bathing: Min guard;Sit to/from stand   Upper Body Dressing : Set up;Sitting   Lower Body Dressing: Min guard;Sit to/from stand (pants only)   Toilet Transfer: Min guard;Ambulation             General ADL Comments: pt dressed in her own clothing and ambulated to bathroom to don jewelry.  Pt very nauseous at end of session:  RN aware     Vision     Perception     Praxis      Pertinent Vitals/Pain Pain Score: 4  Pain Location: R knee Pain Descriptors / Indicators: Aching Pain Intervention(s): Limited activity within patient's tolerance;Monitored  during session;Premedicated before session;Repositioned     Hand Dominance     Extremity/Trunk Assessment Upper Extremity Assessment Upper Extremity Assessment: Overall WFL for tasks assessed           Communication Communication Communication: No difficulties   Cognition Arousal/Alertness: Awake/alert Behavior During Therapy: WFL for tasks assessed/performed Overall Cognitive Status: Within Functional Limits for tasks assessed                     General Comments       Exercises       Shoulder Instructions      Home Living Family/patient expects to be discharged to:: Skilled nursing facility (vs HHOT)                   Bathroom Shower/Tub: Occupational psychologist: Standard     Home Equipment: Shower seat, BSC          Prior Functioning/Environment Level of Independence: Independent             OT Diagnosis: Acute pain;Generalized weakness   OT Problem List: Decreased strength;Decreased activity tolerance;Pain   OT Treatment/Interventions: Self-care/ADL training;DME and/or AE instruction;Patient/family education;Balance training  OT Goals(Current goals can be found in the care plan section) Acute Rehab OT Goals 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 Goal Formulation: With patient Time For Goal Achievement: 10/11/14 Potential to Achieve Goals: Good ADL Goals Additional ADL Goal #1: pt will be mod I gathering supplies for adls and with toilet and shower transfers  OT Frequency: Min 2X/week   Barriers to D/C:            Co-evaluation              End of Session Nurse Communication:  (nausea)  Activity Tolerance:  (nauseous at end) Patient left: in bed;with call bell/phone within reach   Time: 0837-0908 OT Time Calculation (min): 31 min Charges:  OT General Charges $OT Visit: 1 Procedure OT Evaluation $Initial OT Evaluation Tier I: 1 Procedure OT Treatments $Self  Care/Home Management : 8-22 mins G-Codes:    [name] 12-Oct-2014, 9:23 AM Lesle Chris, OTR/L [phone] 10/12/2014

## 2014-10-04 NOTE — Progress Notes (Signed)
Patient ID: Kelly [name], female   DOB: [birth year], 69 y.o.   MRN: [id number] No acute changes.  Slow mobility with therapy.  Patient motivated to get moving and try for more progress today.  Vitals stable.  Calf soft.  Dressing clean.

## 2014-10-04 NOTE — Progress Notes (Signed)
Physical Therapy Treatment Patient Details Name: Kelly [name] MRN: [id number] DOB: 1945/11/10 Today's Date: 10/18/14    History of Present Illness R UKR; s/p L TKR with multiple revisions    PT Comments    Pt motivated but ltd this am by nausea  Follow Up Recommendations  SNF     Equipment Recommendations  Rolling walker with 5" wheels    Recommendations for Other Services OT consult     Precautions / Restrictions Precautions Precautions: Knee;Fall Restrictions Weight Bearing Restrictions: No Other Position/Activity Restrictions: WBAT    Mobility  Bed Mobility Overal bed mobility: Needs Assistance Bed Mobility: Sit to Supine     Supine to sit: Supervision Sit to supine: Min guard   General bed mobility comments: pt crossed LLE under R ankle to assist  Transfers Overall transfer level: Needs assistance Equipment used: Rolling walker (2 wheeled) Transfers: Sit to/from Stand Sit to Stand: Min guard         General transfer comment: for safety; pt cued herself for hand placement  Ambulation/Gait Ambulation/Gait assistance: Min guard Ambulation Distance (Feet): 150 Feet Assistive device: Rolling walker (2 wheeled) Gait Pattern/deviations: Step-to pattern;Step-through pattern;Decreased step length - right;Decreased step length - left;Shuffle;Trunk flexed Gait velocity: decr   General Gait Details: cues for posture and position from RW.  Multiple rests 2* nausea to complete task   Stairs            Wheelchair Mobility    Modified Rankin (Stroke Patients Only)       Balance                                    Cognition Arousal/Alertness: Awake/alert Behavior During Therapy: WFL for tasks assessed/performed Overall Cognitive Status: Within Functional Limits for tasks assessed                      Exercises      General Comments        Pertinent Vitals/Pain Pain Assessment: 0-10 Pain Score: 4  Pain  Location: R knee Pain Descriptors / Indicators: Aching;Sore Pain Intervention(s): Limited activity within patient's tolerance;Monitored during session;Premedicated before session;Ice applied    Home Living Family/patient expects to be discharged to:: Skilled nursing facility (vs HHOT)             Home Equipment: Shower seat      Prior Function Level of Independence: Independent          PT Goals (current goals can now be found in the care plan section) Acute Rehab PT Goals 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

## 2014-10-05 ENCOUNTER — Encounter (HOSPITAL_COMMUNITY): Payer: Self-pay | Admitting: Orthopaedic Surgery

## 2014-10-05 MED ORDER — OXYCODONE-ACETAMINOPHEN 5-325 MG PO TABS: 1.0000 | ORAL_TABLET | ORAL | Status: DC | PRN

## 2014-10-05 MED ORDER — ASPIRIN 325 MG PO TBEC: 325.0000 mg | DELAYED_RELEASE_TABLET | Freq: Two times a day (BID) | ORAL | Status: DC

## 2014-10-05 MED ORDER — DOCUSATE SODIUM 100 MG PO CAPS: 100.0000 mg | ORAL_CAPSULE | Freq: Two times a day (BID) | ORAL | Status: DC

## 2014-10-05 NOTE — Clinical Social Work Note (Signed)
CSW faxed pt information to SNF's .  Waiting to see what decision pt and her MD decide regarding rehab versus going home with home health.   Also need to follow up with insurance company as pt may not qualify for SNF for her recent surgery.     Dede Query, LCSW [place] Worker - Weekend Coverage cell #: [phone]

## 2014-10-05 NOTE — Care Management Note (Signed)
Case Management Note  Patient Details  Name: DAYONA SHAHEEN MRN: [id number] Date of Birth: [birth year]  Subjective/Objective:          Right unicompartmental partial knee arthroplasty          Action/Plan: Discharge planning, spoke with patient at bedside. Has chosen [place] for [facility] services, does not want to go to rehab. Needs a RW, requested from [facility]. Notified [facility] of patient likely d/c this pm.   Expected Discharge Date:                  Expected Discharge Plan:  [place]  In-House Referral:  Clinical Social Work  Discharge planning Services  CM Consult  Post Acute Care Choice:  Home Health Choice offered to:  Patient  DME Arranged:  Walker rolling DME Agency:  [place] Arranged:  PT [place]:  [place]  Status of Service:  Completed, signed off  Medicare Important Message Given:    Date Medicare IM Given:    Medicare IM give by:    Date Additional Medicare IM Given:    Additional Medicare Important Message give by:     If discussed at [place] of Stay Meetings, dates discussed:    Additional Comments:  Guadalupe Maple, RN 10/05/2014, 4:26 PM

## 2014-10-05 NOTE — Progress Notes (Addendum)
Physical Therapy Treatment Patient Details Name: Kelly [name] MRN: [id number] DOB: 09-07-1945 Today's Date: 10/05/2014    History of Present Illness R UKR; s/p L TKR with multiple revisions    PT Comments    Pt reports less pain this morning, however pt is quite groggy and  frequently during PT session would have difficulty focusing and had multiple episodes where she'd "space out" (nearly fall asleep) but not lose conciousness . Significantly increased time to perform all mobility, compared to previous PT sessions over the past few days. She had difficulty recalling instructions and decreased safety awareness. Vital signs stable. Pt oriented to month, year, self, situation. Pt's speech was slow, she'd sometimes stop talking mid sentence. Not safe to DC home alone at present.    Follow Up Recommendations  SNF;Home health PT (dependent on acute stay progress and assist level at home)     Equipment Recommendations  Rolling walker with 5" wheels    Recommendations for Other Services OT consult     Precautions / Restrictions Precautions Precautions: Knee;Fall Restrictions Weight Bearing Restrictions: No Other Position/Activity Restrictions: WBAT    Mobility  Bed Mobility Overal bed mobility: Needs Assistance Bed Mobility: Supine to Sit     Supine to sit: Min assist     General bed mobility comments: pt crossed LLE under R ankle to assist but still required assist 2* increased pain  Transfers Overall transfer level: Needs assistance Equipment used: Rolling walker (2 wheeled) Transfers: Sit to/from Stand Sit to Stand: Min assist         General transfer comment: increased time, multiple verbal and manual cues for hand placement and technique, pt seems to have difficulty processing instructions  Ambulation/Gait Ambulation/Gait assistance: Min guard Ambulation Distance (Feet): 45 Feet Assistive device: Rolling walker (2 wheeled) Gait Pattern/deviations: Step-to  pattern;Decreased step length - left;Decreased step length - right Gait velocity: slower than yesterday Gait velocity interpretation: Below normal speed for age/gender General Gait Details: frequent verbal cues for sequencing, increased time for processing instructions (this is a change from yesterday)   Stairs Stairs: Yes Stairs assistance: Min assist Stair Management: Two rails;Step to pattern;Forwards Number of Stairs: 3 General stair comments: Multiple frequent Verbal and manual cues for sequencing/technique, pt had difficulty retaining sequencing instructions, increased time  Wheelchair Mobility    Modified Rankin (Stroke Patients Only)       Balance                                    Cognition Arousal/Alertness: Awake/alert Behavior During Therapy: WFL for tasks assessed/performed Overall Cognitive Status: Within Functional Limits for tasks assessed                      Exercises Total Joint Exercises Ankle Circles/Pumps: AROM;Both;15 reps;Supine Quad Sets: AROM;Both;10 reps;Supine Short Arc Quad: AAROM;Right;10 reps;Supine Heel Slides: AAROM;Right;Supine;10 reps Hip ABduction/ADduction: AAROM;Right;10 reps;Supine Straight Leg Raises: AAROM;Right;Supine;10 reps Goniometric ROM: 10-45 R knee AAROM    General Comments        Pertinent Vitals/Pain Pain Assessment: 0-10 Pain Score: 4  Pain Location: R knee Pain Descriptors / Indicators: Sore Pain Intervention(s): Limited activity within patient's tolerance;Monitored during session;Premedicated before session;Ice applied    Home Living                      Prior Function  PT Goals (current goals can now be found in the care plan section) Acute Rehab PT Goals 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 Goal Formulation: With patient Time For Goal Achievement: 10/10/14 Potential to Achieve Goals: Good Progress towards PT goals: Not  progressing toward goals - comment (change in cognitive status)    Frequency  7X/week    PT Plan Current plan remains appropriate    Co-evaluation             End of Session Equipment Utilized During Treatment: Gait belt Activity Tolerance: No increased pain;Patient limited by fatigue Patient left: with call bell/phone within reach;in chair;with chair alarm set     Time: 8413-2440 PT Time Calculation (min) (ACUTE ONLY): 35 min  Charges:  $Gait Training: 8-22 mins $Therapeutic Exercise: 8-22 mins                    G Codes:      Philomena Doheny 10/05/2014, 10:35 AM [phone]

## 2014-10-05 NOTE — Progress Notes (Signed)
Physical Therapy Treatment Patient Details Name: Kelly [name] MRN: [id number] DOB: 10-17-1945 Today's Date: 10/05/2014    History of Present Illness R UKR; s/p L TKR with multiple revisions    PT Comments    Pt's mental status is much improved this afternoon. She tolerated walking 160' with RW and supervision. Will review stairs tomorrow morning.    Follow Up Recommendations  SNF;Home health PT (dependent on acute stay progress and assist level at home)     Equipment Recommendations  Rolling walker with 5" wheels    Recommendations for Other Services OT consult     Precautions / Restrictions Precautions Precautions: Knee;Fall Restrictions Weight Bearing Restrictions: No Other Position/Activity Restrictions: WBAT    Mobility  Bed Mobility Overal bed mobility: Needs Assistance Bed Mobility: Supine to Sit     Supine to sit: Supervision Sit to supine: Min assist   General bed mobility comments: pt crossed LLE under R ankle to assist, min A to bring BLEs up into bed  Transfers Overall transfer level: Needs assistance Equipment used: Rolling walker (2 wheeled) Transfers: Sit to/from Stand Sit to Stand: Supervision         General transfer comment: verbal cues for hand placement  Ambulation/Gait Ambulation/Gait assistance: Supervision Ambulation Distance (Feet): 160 Feet Assistive device: Rolling walker (2 wheeled) Gait Pattern/deviations: Step-to pattern;Decreased step length - right;Decreased step length - left Gait velocity: improved from this morning   General Gait Details: verbal cues for positioning in RW   Stairs            Wheelchair Mobility    Modified Rankin (Stroke Patients Only)       Balance                                    Cognition                            Exercises      General Comments        Pertinent Vitals/Pain Pain Score: 7  Pain Location: R knee Pain Descriptors / Indicators:  Sore Pain Intervention(s): Limited activity within patient's tolerance;Monitored during session;Ice applied    Home Living                      Prior Function            PT Goals (current goals can now be found in the care plan section) Acute Rehab PT Goals 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 Goal Formulation: With patient Time For Goal Achievement: 10/10/14 Potential to Achieve Goals: Good Progress towards PT goals: Progressing toward goals    Frequency  7X/week    PT Plan Current plan remains appropriate    Co-evaluation             End of Session Equipment Utilized During Treatment: Gait belt Activity Tolerance: Patient tolerated treatment well Patient left: with call bell/phone within reach;in bed;with bed alarm set     Time: 1347-1429 PT Time Calculation (min) (ACUTE ONLY): 42 min  Charges:  $Gait Training: 23-37 mins $Therapeutic Activity: 8-22 mins                    G Codes:      Kelly [name] 10/05/2014, 2:39 PM [phone]

## 2014-10-05 NOTE — Progress Notes (Signed)
CSW met with pt / sister to assist with d/c planning. Pt will return home with [place] when stable for d/c. MD expects to d/c pt tomorrow. Pt's sister will spend 1-2 days with pt following d/c. RNCM will assist with d/c planning needs.  Werner Lean LCSW [phone]

## 2014-10-05 NOTE — Progress Notes (Signed)
Subjective: 3 Days Post-Op Procedure(s) (LRB): RIGHT KNEE MEDIAL UNICOMPARTMENTAL ARTHROPLASTY (Right) Patient reports pain as severe.   Reports no one at home to help with ADLs wants to work with PT today and d/c home tomorrow.  Objective: Vital signs in last 24 hours: Temp:  [99.3 F (37.4 C)-99.7 F (37.6 C)] 99.6 F (37.6 C) (08/22 0501) Pulse Rate:  [70-78] 70 (08/22 0501) Resp:  [16-17] 17 (08/22 0501) BP: (105-152)/(67-77) 105/67 mmHg (08/22 0501) SpO2:  [92 %-94 %] 92 % (08/22 0501)  Intake/Output from previous day: 08/21 0701 - 08/22 0700 In: 1180 [P.O.:1180] Out: -  Intake/Output this shift:    No results for input(s): HGB in the last 72 hours. No results for input(s): WBC, RBC, HCT, PLT in the last 72 hours. No results for input(s): NA, K, CL, CO2, BUN, CREATININE, GLUCOSE, CALCIUM in the last 72 hours. No results for input(s): LABPT, INR in the last 72 hours.  Sensation intact distally Intact pulses distally Incision: no drainage Compartment soft  Assessment/Plan: 3 Days Post-Op Procedure(s) (LRB): RIGHT KNEE MEDIAL UNICOMPARTMENTAL ARTHROPLASTY (Right) Up with therapy  Plan d/c to home tomorrow with home health.  Erskine Emery 10/05/2014, 7:58 AM

## 2014-10-05 NOTE — Progress Notes (Signed)
Occupational Therapy Treatment Patient Details Name: Kelly [name] MRN: [id number] DOB: 01-20-1946 Today's Date: 10/05/2014    History of present illness R UKR; s/p L TKR with multiple revisions   OT comments  Pt slow to respond- but is aware.  Pt is not safe to DC home alone  Follow Up Recommendations  Supervision - Intermittent;Home health OT;Supervision/Assistance - 24 hour    Equipment Recommendations  3 in 1 bedside comode    Recommendations for Other Services      Precautions / Restrictions Precautions Precautions: Knee;Fall Restrictions Weight Bearing Restrictions: No Other Position/Activity Restrictions: WBAT       Mobility Bed Mobility Overal bed mobility: Needs Assistance Bed Mobility: Supine to Sit     Supine to sit: Min assist     General bed mobility comments: pt in chair  Transfers Overall transfer level: Needs assistance Equipment used: Rolling walker (2 wheeled) Transfers: Sit to/from Stand Sit to Stand: Min assist         General transfer comment: increased time! pt has called sister to come as she is aware she is not thinking clearly                        Cognition   Behavior During Therapy: WFL for tasks assessed/performed Overall Cognitive Status: Within Functional Limits for tasks assessed                               General Comments  pt not safe to Dc home alone.  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

## 2014-10-05 NOTE — Discharge Summary (Signed)
Patient ID: Kelly [name] MRN: [id number] DOB/AGE: [birth year] 69 y.o.  Admit date: 10/02/2014 Discharge date: 10/05/2014  Admission Diagnoses:  Principal Problem:   Osteoarthritis of right knee medial compartment Active Problems:   Status post right partial knee replacement   Discharge Diagnoses:  Same  Past Medical History  Diagnosis Date  . Hyperlipidemia   . Hypertension   . Arthritis   . Anemia   . Diabetes mellitus     not on meds last [facility] in [facility] 04/20/11   . Renal insufficiency, mild     CKD- stage I per office visit note of PCP   . Depression     Surgeries: Procedure(s): RIGHT KNEE MEDIAL UNICOMPARTMENTAL ARTHROPLASTY on 10/02/2014   Consultants:    Discharged Condition: Improved  Hospital Course: Kelly [name] is an 69 y.o. female who was admitted 10/02/2014 for operative treatment ofOsteoarthritis of right knee. Patient has severe unremitting pain that affects sleep, daily activities, and work/hobbies. After pre-op clearance the patient was taken to the operating room on 10/02/2014 and underwent  Procedure(s): RIGHT KNEE MEDIAL UNICOMPARTMENTAL ARTHROPLASTY.    Patient was given perioperative antibiotics: Anti-infectives    Start     Dose/Rate Route Frequency Ordered Stop   10/02/14 1700  clindamycin (CLEOCIN) IVPB 600 mg     600 mg 100 mL/hr over 30 Minutes Intravenous Every 6 hours 10/02/14 1455 10/03/14 0007   10/02/14 0803  clindamycin (CLEOCIN) IVPB 900 mg     900 mg 100 mL/hr over 30 Minutes Intravenous On call to O.R. 10/02/14 0803 10/02/14 1105       Patient was given sequential compression devices, early ambulation, and chemoprophylaxis to prevent DVT.  Patient benefited maximally from hospital stay and there were no complications.    Recent vital signs: Patient Vitals for the past 24 hrs:  BP Temp Temp src Pulse Resp SpO2  10/05/14 1536 135/68 mmHg 98.2 F (36.8 C) Oral 82 16 95 %  10/05/14 1028 123/69 mmHg - - 79 - 93 %  10/05/14  0840 125/75 mmHg - - - - -  10/05/14 0501 105/67 mmHg 99.6 F (37.6 C) Oral 70 17 92 %  10/04/14 2116 (!) 152/76 mmHg 99.7 F (37.6 C) Oral 77 16 94 %     Recent laboratory studies: No results for input(s): WBC, HGB, HCT, PLT, NA, K, CL, CO2, BUN, CREATININE, GLUCOSE, INR, CALCIUM in the last 72 hours.  Invalid input(s): PT, 2   Discharge Medications:     Medication List    STOP taking these medications        aspirin 81 MG tablet  Replaced by:  aspirin 325 MG EC tablet     traMADol 50 MG tablet  Commonly known as:  ULTRAM      TAKE these medications        amLODipine 5 MG tablet  Commonly known as:  NORVASC  TAKE 1 TABLET BY MOUTH EVERY DAY     aspirin 325 MG EC tablet  Take 1 tablet (325 mg total) by mouth 2 (two) times daily after a meal.     cholecalciferol 1000 UNITS tablet  Commonly known as:  VITAMIN D  Take 1,000 Units by mouth daily.     docusate sodium 100 MG capsule  Commonly known as:  COLACE  Take 1 capsule (100 mg total) by mouth 2 (two) times daily.     ferrous gluconate 324 MG tablet  Commonly known as:  FERGON  Take 200 mg by mouth  2 (two) times daily with a meal.     methocarbamol 500 MG tablet  Commonly known as:  ROBAXIN  TAKE 1 TABLET BY MOUTH FOUR TIMES DAILY AS NEEDED     methylPREDNISolone 4 MG tablet  Commonly known as:  MEDROL  Take 4 mg by mouth daily as needed (pain).     oxyCODONE-acetaminophen 5-325 MG per tablet  Commonly known as:  ROXICET  Take 1-2 tablets by mouth every 4 (four) hours as needed for severe pain.     rosuvastatin 10 MG tablet  Commonly known as:  CRESTOR  Take 1 tablet (10 mg total) by mouth daily.        Diagnostic Studies: Dg Knee Right Port  10/02/2014   CLINICAL DATA:  Right knee replacement.  EXAM: PORTABLE RIGHT KNEE - 1-2 VIEW  COMPARISON:  None.  FINDINGS: A hemiarthroplasty is noted along the medial aspect of the right knee. The knee is located. The components are well seated. Fluid and gas are  noted within the joint space. Degenerative changes are present within the palpable femoral compartment.  IMPRESSION: Right knee hemiarthroplasty without radiographic evidence for complication.   Electronically Signed   By: San Morelle M.D.   On: 10/02/2014 14:04    Disposition: to home       Discharge Instructions    Discharge patient    Complete by:  As directed      Weight bearing as tolerated    Complete by:  As directed   Laterality:  right  Extremity:  Lower           Follow-up Information    Follow up with Mcarthur Rossetti, MD.   Specialty:  Orthopedic Surgery   Contact information:   [place] [address] [phone]       Follow up with [facility].   Why:  home health physical therapy   Contact information:   [place] [address] [phone]       Follow up with Mcarthur Rossetti, MD. Schedule an appointment as soon as possible for a visit in 2 weeks.   Specialty:  Orthopedic Surgery   Contact information:   [place] [address] [phone]        Signed: Mcarthur Rossetti 10/05/2014, 5:20 PM   !

## 2014-10-06 ENCOUNTER — Encounter (HOSPITAL_COMMUNITY): Payer: Self-pay | Admitting: Orthopaedic Surgery

## 2014-10-07 DIAGNOSIS — N181 Chronic kidney disease, stage 1: Secondary | ICD-10-CM | POA: Diagnosis not present

## 2014-10-07 DIAGNOSIS — M1990 Unspecified osteoarthritis, unspecified site: Secondary | ICD-10-CM | POA: Diagnosis not present

## 2014-10-07 DIAGNOSIS — I129 Hypertensive chronic kidney disease with stage 1 through stage 4 chronic kidney disease, or unspecified chronic kidney disease: Secondary | ICD-10-CM | POA: Diagnosis not present

## 2014-10-07 DIAGNOSIS — Z471 Aftercare following joint replacement surgery: Secondary | ICD-10-CM | POA: Diagnosis not present

## 2014-10-07 DIAGNOSIS — E119 Type 2 diabetes mellitus without complications: Secondary | ICD-10-CM | POA: Diagnosis not present

## 2014-10-07 DIAGNOSIS — F419 Anxiety disorder, unspecified: Secondary | ICD-10-CM | POA: Diagnosis not present

## 2014-10-08 DIAGNOSIS — N181 Chronic kidney disease, stage 1: Secondary | ICD-10-CM | POA: Diagnosis not present

## 2014-10-08 DIAGNOSIS — Z471 Aftercare following joint replacement surgery: Secondary | ICD-10-CM | POA: Diagnosis not present

## 2014-10-08 DIAGNOSIS — F419 Anxiety disorder, unspecified: Secondary | ICD-10-CM | POA: Diagnosis not present

## 2014-10-08 DIAGNOSIS — E119 Type 2 diabetes mellitus without complications: Secondary | ICD-10-CM | POA: Diagnosis not present

## 2014-10-08 DIAGNOSIS — I129 Hypertensive chronic kidney disease with stage 1 through stage 4 chronic kidney disease, or unspecified chronic kidney disease: Secondary | ICD-10-CM | POA: Diagnosis not present

## 2014-10-08 DIAGNOSIS — M1990 Unspecified osteoarthritis, unspecified site: Secondary | ICD-10-CM | POA: Diagnosis not present

## 2014-10-09 DIAGNOSIS — E119 Type 2 diabetes mellitus without complications: Secondary | ICD-10-CM | POA: Diagnosis not present

## 2014-10-09 DIAGNOSIS — N181 Chronic kidney disease, stage 1: Secondary | ICD-10-CM | POA: Diagnosis not present

## 2014-10-09 DIAGNOSIS — M1990 Unspecified osteoarthritis, unspecified site: Secondary | ICD-10-CM | POA: Diagnosis not present

## 2014-10-09 DIAGNOSIS — Z471 Aftercare following joint replacement surgery: Secondary | ICD-10-CM | POA: Diagnosis not present

## 2014-10-09 DIAGNOSIS — F419 Anxiety disorder, unspecified: Secondary | ICD-10-CM | POA: Diagnosis not present

## 2014-10-09 DIAGNOSIS — I129 Hypertensive chronic kidney disease with stage 1 through stage 4 chronic kidney disease, or unspecified chronic kidney disease: Secondary | ICD-10-CM | POA: Diagnosis not present

## 2014-10-12 DIAGNOSIS — E119 Type 2 diabetes mellitus without complications: Secondary | ICD-10-CM | POA: Diagnosis not present

## 2014-10-12 DIAGNOSIS — F419 Anxiety disorder, unspecified: Secondary | ICD-10-CM | POA: Diagnosis not present

## 2014-10-12 DIAGNOSIS — I129 Hypertensive chronic kidney disease with stage 1 through stage 4 chronic kidney disease, or unspecified chronic kidney disease: Secondary | ICD-10-CM | POA: Diagnosis not present

## 2014-10-12 DIAGNOSIS — M1990 Unspecified osteoarthritis, unspecified site: Secondary | ICD-10-CM | POA: Diagnosis not present

## 2014-10-12 DIAGNOSIS — N181 Chronic kidney disease, stage 1: Secondary | ICD-10-CM | POA: Diagnosis not present

## 2014-10-12 DIAGNOSIS — Z471 Aftercare following joint replacement surgery: Secondary | ICD-10-CM | POA: Diagnosis not present

## 2014-10-13 DIAGNOSIS — N181 Chronic kidney disease, stage 1: Secondary | ICD-10-CM | POA: Diagnosis not present

## 2014-10-13 DIAGNOSIS — F419 Anxiety disorder, unspecified: Secondary | ICD-10-CM | POA: Diagnosis not present

## 2014-10-13 DIAGNOSIS — M1990 Unspecified osteoarthritis, unspecified site: Secondary | ICD-10-CM | POA: Diagnosis not present

## 2014-10-13 DIAGNOSIS — E119 Type 2 diabetes mellitus without complications: Secondary | ICD-10-CM | POA: Diagnosis not present

## 2014-10-13 DIAGNOSIS — Z471 Aftercare following joint replacement surgery: Secondary | ICD-10-CM | POA: Diagnosis not present

## 2014-10-13 DIAGNOSIS — I129 Hypertensive chronic kidney disease with stage 1 through stage 4 chronic kidney disease, or unspecified chronic kidney disease: Secondary | ICD-10-CM | POA: Diagnosis not present

## 2014-10-14 DIAGNOSIS — E119 Type 2 diabetes mellitus without complications: Secondary | ICD-10-CM | POA: Diagnosis not present

## 2014-10-14 DIAGNOSIS — F419 Anxiety disorder, unspecified: Secondary | ICD-10-CM | POA: Diagnosis not present

## 2014-10-14 DIAGNOSIS — N181 Chronic kidney disease, stage 1: Secondary | ICD-10-CM | POA: Diagnosis not present

## 2014-10-14 DIAGNOSIS — I129 Hypertensive chronic kidney disease with stage 1 through stage 4 chronic kidney disease, or unspecified chronic kidney disease: Secondary | ICD-10-CM | POA: Diagnosis not present

## 2014-10-14 DIAGNOSIS — M1990 Unspecified osteoarthritis, unspecified site: Secondary | ICD-10-CM | POA: Diagnosis not present

## 2014-10-14 DIAGNOSIS — Z471 Aftercare following joint replacement surgery: Secondary | ICD-10-CM | POA: Diagnosis not present

## 2014-10-15 DIAGNOSIS — M1711 Unilateral primary osteoarthritis, right knee: Secondary | ICD-10-CM | POA: Diagnosis not present

## 2014-10-16 DIAGNOSIS — F419 Anxiety disorder, unspecified: Secondary | ICD-10-CM | POA: Diagnosis not present

## 2014-10-16 DIAGNOSIS — I129 Hypertensive chronic kidney disease with stage 1 through stage 4 chronic kidney disease, or unspecified chronic kidney disease: Secondary | ICD-10-CM | POA: Diagnosis not present

## 2014-10-16 DIAGNOSIS — M1990 Unspecified osteoarthritis, unspecified site: Secondary | ICD-10-CM | POA: Diagnosis not present

## 2014-10-16 DIAGNOSIS — Z471 Aftercare following joint replacement surgery: Secondary | ICD-10-CM | POA: Diagnosis not present

## 2014-10-16 DIAGNOSIS — N181 Chronic kidney disease, stage 1: Secondary | ICD-10-CM | POA: Diagnosis not present

## 2014-10-16 DIAGNOSIS — E119 Type 2 diabetes mellitus without complications: Secondary | ICD-10-CM | POA: Diagnosis not present

## 2014-10-21 DIAGNOSIS — E119 Type 2 diabetes mellitus without complications: Secondary | ICD-10-CM | POA: Diagnosis not present

## 2014-10-21 DIAGNOSIS — F419 Anxiety disorder, unspecified: Secondary | ICD-10-CM | POA: Diagnosis not present

## 2014-10-21 DIAGNOSIS — N181 Chronic kidney disease, stage 1: Secondary | ICD-10-CM | POA: Diagnosis not present

## 2014-10-21 DIAGNOSIS — Z471 Aftercare following joint replacement surgery: Secondary | ICD-10-CM | POA: Diagnosis not present

## 2014-10-21 DIAGNOSIS — I129 Hypertensive chronic kidney disease with stage 1 through stage 4 chronic kidney disease, or unspecified chronic kidney disease: Secondary | ICD-10-CM | POA: Diagnosis not present

## 2014-10-21 DIAGNOSIS — M1990 Unspecified osteoarthritis, unspecified site: Secondary | ICD-10-CM | POA: Diagnosis not present

## 2014-10-22 DIAGNOSIS — N181 Chronic kidney disease, stage 1: Secondary | ICD-10-CM | POA: Diagnosis not present

## 2014-10-22 DIAGNOSIS — I129 Hypertensive chronic kidney disease with stage 1 through stage 4 chronic kidney disease, or unspecified chronic kidney disease: Secondary | ICD-10-CM | POA: Diagnosis not present

## 2014-10-22 DIAGNOSIS — F419 Anxiety disorder, unspecified: Secondary | ICD-10-CM | POA: Diagnosis not present

## 2014-10-22 DIAGNOSIS — M1990 Unspecified osteoarthritis, unspecified site: Secondary | ICD-10-CM | POA: Diagnosis not present

## 2014-10-22 DIAGNOSIS — E119 Type 2 diabetes mellitus without complications: Secondary | ICD-10-CM | POA: Diagnosis not present

## 2014-10-22 DIAGNOSIS — Z471 Aftercare following joint replacement surgery: Secondary | ICD-10-CM | POA: Diagnosis not present

## 2014-10-23 DIAGNOSIS — F419 Anxiety disorder, unspecified: Secondary | ICD-10-CM | POA: Diagnosis not present

## 2014-10-23 DIAGNOSIS — N181 Chronic kidney disease, stage 1: Secondary | ICD-10-CM | POA: Diagnosis not present

## 2014-10-23 DIAGNOSIS — E119 Type 2 diabetes mellitus without complications: Secondary | ICD-10-CM | POA: Diagnosis not present

## 2014-10-23 DIAGNOSIS — M1990 Unspecified osteoarthritis, unspecified site: Secondary | ICD-10-CM | POA: Diagnosis not present

## 2014-10-23 DIAGNOSIS — I129 Hypertensive chronic kidney disease with stage 1 through stage 4 chronic kidney disease, or unspecified chronic kidney disease: Secondary | ICD-10-CM | POA: Diagnosis not present

## 2014-10-23 DIAGNOSIS — Z471 Aftercare following joint replacement surgery: Secondary | ICD-10-CM | POA: Diagnosis not present

## 2014-11-18 ENCOUNTER — Ambulatory Visit: Payer: Medicare Other | Admitting: Internal Medicine

## 2014-11-25 ENCOUNTER — Ambulatory Visit (INDEPENDENT_AMBULATORY_CARE_PROVIDER_SITE_OTHER): Payer: Medicare Other | Admitting: Internal Medicine

## 2014-11-25 VITALS — Temp 98.0°F

## 2014-11-25 DIAGNOSIS — Z23 Encounter for immunization: Secondary | ICD-10-CM

## 2014-11-26 ENCOUNTER — Ambulatory Visit: Payer: Medicare Other | Admitting: Internal Medicine

## 2014-12-19 ENCOUNTER — Other Ambulatory Visit: Payer: Self-pay | Admitting: Internal Medicine

## 2015-01-08 DIAGNOSIS — H2513 Age-related nuclear cataract, bilateral: Secondary | ICD-10-CM | POA: Diagnosis not present

## 2015-01-08 DIAGNOSIS — H40033 Anatomical narrow angle, bilateral: Secondary | ICD-10-CM | POA: Diagnosis not present

## 2015-01-08 LAB — HM DIABETES EYE EXAM

## 2015-04-23 ENCOUNTER — Encounter: Payer: Self-pay | Admitting: *Deleted

## 2015-04-28 ENCOUNTER — Other Ambulatory Visit: Payer: Self-pay | Admitting: Internal Medicine

## 2015-04-28 NOTE — Telephone Encounter (Signed)
Pt was to have called about PE in November. Past due for Medicare CPE. Please refill x 30 days and call her to book.

## 2015-04-29 ENCOUNTER — Other Ambulatory Visit: Payer: Self-pay | Admitting: Internal Medicine

## 2015-05-11 ENCOUNTER — Telehealth: Payer: Self-pay | Admitting: Internal Medicine

## 2015-05-11 NOTE — Telephone Encounter (Signed)
ERROR

## 2015-05-18 ENCOUNTER — Other Ambulatory Visit: Payer: Self-pay

## 2015-05-18 DIAGNOSIS — Z1231 Encounter for screening mammogram for malignant neoplasm of breast: Secondary | ICD-10-CM

## 2015-05-18 DIAGNOSIS — Z9889 Other specified postprocedural states: Secondary | ICD-10-CM

## 2015-06-18 ENCOUNTER — Ambulatory Visit
Admission: RE | Admit: 2015-06-18 | Discharge: 2015-06-18 | Disposition: A | Discharge status: Home / Self Care | Payer: Medicare Other | Source: Ambulatory Visit

## 2015-06-18 DIAGNOSIS — Z9889 Other specified postprocedural states: Secondary | ICD-10-CM

## 2015-06-18 DIAGNOSIS — Z1231 Encounter for screening mammogram for malignant neoplasm of breast: Secondary | ICD-10-CM

## 2015-07-29 ENCOUNTER — Other Ambulatory Visit: Payer: Self-pay | Admitting: Internal Medicine

## 2015-07-29 NOTE — Telephone Encounter (Signed)
Not seen since Jul28, 2016. Please call and book CPE before refilling.

## 2015-07-30 NOTE — Telephone Encounter (Signed)
Pt booked

## 2015-08-05 DIAGNOSIS — M5414 Radiculopathy, thoracic region: Secondary | ICD-10-CM | POA: Diagnosis not present

## 2015-08-05 DIAGNOSIS — M9902 Segmental and somatic dysfunction of thoracic region: Secondary | ICD-10-CM | POA: Diagnosis not present

## 2015-08-05 DIAGNOSIS — M5032 Other cervical disc degeneration, mid-cervical region, unspecified level: Secondary | ICD-10-CM | POA: Diagnosis not present

## 2015-08-05 DIAGNOSIS — M9901 Segmental and somatic dysfunction of cervical region: Secondary | ICD-10-CM | POA: Diagnosis not present

## 2015-08-11 DIAGNOSIS — M5414 Radiculopathy, thoracic region: Secondary | ICD-10-CM | POA: Diagnosis not present

## 2015-08-11 DIAGNOSIS — M9902 Segmental and somatic dysfunction of thoracic region: Secondary | ICD-10-CM | POA: Diagnosis not present

## 2015-08-11 DIAGNOSIS — M9901 Segmental and somatic dysfunction of cervical region: Secondary | ICD-10-CM | POA: Diagnosis not present

## 2015-08-11 DIAGNOSIS — M5032 Other cervical disc degeneration, mid-cervical region, unspecified level: Secondary | ICD-10-CM | POA: Diagnosis not present

## 2015-08-12 ENCOUNTER — Encounter: Payer: Self-pay | Admitting: Internal Medicine

## 2015-08-12 ENCOUNTER — Ambulatory Visit (INDEPENDENT_AMBULATORY_CARE_PROVIDER_SITE_OTHER): Payer: Medicare Other | Admitting: Internal Medicine

## 2015-08-12 ENCOUNTER — Other Ambulatory Visit: Payer: Medicare Other | Admitting: Internal Medicine

## 2015-08-12 VITALS — BP 104/60 | HR 74 | Temp 97.8°F | Resp 18 | Ht 61.0 in | Wt 193.5 lb

## 2015-08-12 DIAGNOSIS — E785 Hyperlipidemia, unspecified: Secondary | ICD-10-CM

## 2015-08-12 DIAGNOSIS — Z0000 Encounter for general adult medical examination without abnormal findings: Secondary | ICD-10-CM | POA: Diagnosis not present

## 2015-08-12 DIAGNOSIS — Z1329 Encounter for screening for other suspected endocrine disorder: Secondary | ICD-10-CM

## 2015-08-12 DIAGNOSIS — E119 Type 2 diabetes mellitus without complications: Secondary | ICD-10-CM | POA: Diagnosis not present

## 2015-08-12 DIAGNOSIS — E669 Obesity, unspecified: Secondary | ICD-10-CM

## 2015-08-12 DIAGNOSIS — N183 Chronic kidney disease, stage 3 unspecified: Secondary | ICD-10-CM

## 2015-08-12 DIAGNOSIS — E559 Vitamin D deficiency, unspecified: Secondary | ICD-10-CM | POA: Diagnosis not present

## 2015-08-12 DIAGNOSIS — D649 Anemia, unspecified: Secondary | ICD-10-CM | POA: Diagnosis not present

## 2015-08-12 DIAGNOSIS — I10 Essential (primary) hypertension: Secondary | ICD-10-CM | POA: Diagnosis not present

## 2015-08-12 DIAGNOSIS — K5900 Constipation, unspecified: Secondary | ICD-10-CM | POA: Diagnosis not present

## 2015-08-12 DIAGNOSIS — M1990 Unspecified osteoarthritis, unspecified site: Secondary | ICD-10-CM | POA: Diagnosis not present

## 2015-08-12 DIAGNOSIS — R7302 Impaired glucose tolerance (oral): Secondary | ICD-10-CM | POA: Diagnosis not present

## 2015-08-12 DIAGNOSIS — D509 Iron deficiency anemia, unspecified: Secondary | ICD-10-CM | POA: Diagnosis not present

## 2015-08-12 LAB — COMPLETE METABOLIC PANEL WITH GFR
ALBUMIN: 3.9 g/dL (ref 3.6–5.1)
ALK PHOS: 115 U/L (ref 33–130)
ALT: 12 U/L (ref 6–29)
AST: 17 U/L (ref 10–35)
BILIRUBIN TOTAL: 0.6 mg/dL (ref 0.2–1.2)
BUN: 20 mg/dL (ref 7–25)
CALCIUM: 10.1 mg/dL (ref 8.6–10.4)
CO2: 28 mmol/L (ref 20–31)
Chloride: 103 mmol/L (ref 98–110)
Creat: 1.09 mg/dL — ABNORMAL HIGH (ref 0.60–0.93)
GFR, EST NON AFRICAN AMERICAN: 51 mL/min — AB (ref >=60)
GFR, Est African American: 59 mL/min — ABNORMAL LOW (ref >=60)
GLUCOSE: 81 mg/dL (ref 65–99)
Potassium: 4.5 mmol/L (ref 3.5–5.3)
SODIUM: 142 mmol/L (ref 135–146)
TOTAL PROTEIN: 6.7 g/dL (ref 6.1–8.1)

## 2015-08-12 LAB — CBC WITH DIFFERENTIAL/PLATELET
BASOS PCT: 0 %
Basophils Absolute: 0 cells/uL (ref 0–200)
EOS ABS: 138 {cells}/uL (ref 15–500)
EOS PCT: 3 %
HCT: 40.8 % (ref 35.0–45.0)
Hemoglobin: 13.7 g/dL (ref 11.7–15.5)
LYMPHS PCT: 36 %
Lymphs Abs: 1656 cells/uL (ref 850–3900)
MCH: 30.3 pg (ref 27.0–33.0)
MCHC: 33.6 g/dL (ref 32.0–36.0)
MCV: 90.3 fL (ref 80.0–100.0)
MONOS PCT: 7 %
MPV: 10.8 fL (ref 7.5–12.5)
Monocytes Absolute: 322 cells/uL (ref 200–950)
NEUTROS ABS: 2484 {cells}/uL (ref 1500–7800)
Neutrophils Relative %: 54 %
PLATELETS: 197 10*3/uL (ref 140–400)
RBC: 4.52 MIL/uL (ref 3.80–5.10)
RDW: 14.4 % (ref 11.0–15.0)
WBC: 4.6 10*3/uL (ref 3.8–10.8)

## 2015-08-12 LAB — LIPID PANEL
CHOL/HDL RATIO: 4 ratio (ref <=5.0)
CHOLESTEROL: 157 mg/dL (ref 125–200)
HDL: 39 mg/dL — AB (ref >=46)
LDL Cholesterol: 88 mg/dL (ref <130)
Triglycerides: 148 mg/dL (ref <150)
VLDL: 30 mg/dL (ref <30)

## 2015-08-12 LAB — TSH: TSH: 1.6 m[IU]/L

## 2015-08-12 NOTE — Patient Instructions (Signed)
It was a pleasure to see you today. Continue same medications. Lab work is pending. Return in 6 months. Please consider mammogram. You need to have diabetic eye exam.

## 2015-08-12 NOTE — Progress Notes (Addendum)
Subjective:    Patient ID: Kelly [name], female    DOB: 1945-10-08, 70 y.o.   MRN: [id number]  HPI 70 year old Female in today for health maintenance exam and evaluation of medical issues. She has a history of hyperlipidemia maintained on generic Crestor. History of essential hypertension and controlled type 2 diabetes mellitus without complication. History of elevated serum creatinine. Creatinine runs around 1.5. Have explained to her this is likely due to history of diabetes and essential hypertension.  Patient has had lots of plastic surgery in 2002 in 2003 including an abdominoplasty, thigh liposuction, mastopexy (breast reduction) brachioplasties Fasting lab work drawn and pending.  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 Fredna Dow in 2011.  History of iron deficiency anemia which resolved after stopping anti-inflammatory medication. She had colonoscopy 04/22/2010 which was negative.  History of right carpal tunnel syndrome.  History of rhytindectomy (face lift) with laser peel 2010.  Total left knee replacement 2013 . Right knee arthroscopic surgery 2007. Cholecystectomy 1965. Hysterectomy without oophorectomy at age 42. Right knee replacement by Dr. Ninfa Linden 2016.  Social history: Patient was raised in an orphanage in [place]. Patient's daughter died of metastatic ovarian cancer. Patient was left with a lot of bills to pay for her daughter. Patient formerly worked for the [employer] and has had several jobs since that time. She is retired from the [employer]. Now working as a Land. She is divorced. Drinks 2 glasses of wine daily. Does not smoke.  Family history: Father died of alcoholism. Mother died of heart disease. One sister with history of lung cancer still living. 3 other sisters in good health.    Review of Systems  HENT: Negative.   Respiratory: Negative.   Cardiovascular: Negative for chest pain and leg swelling.   Gastrointestinal:       Some constipation-takes probiotic.       Objective:   Physical Exam  Constitutional: She is oriented to person, place, and time. She appears well-developed and well-nourished. No distress.  HENT:  Head: Normocephalic and atraumatic.  Right Ear: External ear normal.  Left Ear: External ear normal.  Mouth/Throat: Oropharynx is clear and moist. No oropharyngeal exudate.  Eyes: Conjunctivae and EOM are normal. Pupils are equal, round, and reactive to light. Right eye exhibits no discharge. Left eye exhibits no discharge. No scleral icterus.  Neck: Neck supple. No JVD present. No thyromegaly present.  Cardiovascular: Normal rate, regular rhythm, normal heart sounds and intact distal pulses.   No murmur heard. Pulmonary/Chest: Effort normal and breath sounds normal. No respiratory distress. She has no wheezes. She has no rales.  Breasts normal female without masses  Abdominal: Soft. Bowel sounds are normal. She exhibits no distension and no mass. There is no tenderness. There is no rebound and no guarding.  Genitourinary:  Pap deferred due to age. Also has had hysterectomy. Bimanual normal. Still has ovaries.  Musculoskeletal: She exhibits no edema.  Lymphadenopathy:    She has no cervical adenopathy.  Neurological: She is alert and oriented to person, place, and time. She has normal reflexes. No cranial nerve deficit. Coordination normal.  Skin: Skin is warm and dry. No rash noted. She is not diaphoretic.  Psychiatric: She has a normal mood and affect. Her behavior is normal. Judgment and thought content normal.  Vitals reviewed.         Assessment & Plan:  Hyperlipidemia  Essential hypertension  Controlled Type 2 diabetes mellitus  Elevated serum creatinine-chronic  kidney disease stage 3 followed by Dr. Justin Mend  Obesity  Plan: Continue same medications and return in 6 months. Encouraged diet exercise and weight loss. Fasting labs are pending. Cannot  comment on them at this time. She just had them drawn today. Her blood pressure is stable. She needs to have Prevnar 13 vaccine in the near future. She has had pneumococcal 23 vaccine and Tdap vaccine is up-to-date.    Subjective:   Patient presents for Medicare Annual/Subsequent preventive examination.  Review Past Medical/Family/Social:See above   Risk Factors  Current exercise habits: Mostly sedentary Dietary issues discussed: Low fat low carbohydrate  Cardiac risk factors:Hypertension, hyperlipidemia, diabetes mellitus  Depression Screen  (Note: if answer to either of the following is "Yes", a more complete depression screening is indicated)   Over the past two weeks, have you felt down, depressed or hopeless? No  Over the past two weeks, have you felt little interest or pleasure in doing things? No Have you lost interest or pleasure in daily life? No Do you often feel hopeless? No Do you cry easily over simple problems? No   Activities of Daily Living  In your present state of health, do you have any difficulty performing the following activities?:   Driving? No  Managing money? No  Feeding yourself? No  Getting from bed to chair? No  Climbing a flight of stairs? No  Preparing food and eating?: No  Bathing or showering? No  Getting dressed: No  Getting to the toilet? No  Using the toilet:No  Moving around from place to place: No  In the past year have you fallen or had a near fall?:No  Are you sexually active? No  Do you have more than one partner? No   Hearing Difficulties: No  Do you often ask people to speak up or repeat themselves? No  Do you experience ringing or noises in your ears? No  Do you have difficulty understanding soft or whispered voices? No  Do you feel that you have a problem with memory? No Do you often misplace items? No    Home Safety:  Do you have a smoke alarm at your residence? Yes Do you have grab bars in the bathroom?Yes Do you have  throw rugs in your house? Yes   Cognitive Testing  Alert? Yes Normal Appearance?Yes  Oriented to person? Yes Place? Yes  Time? Yes  Recall of three objects? Yes  Can perform simple calculations? Yes  Displays appropriate judgment?Yes  Can read the correct time from a watch face?Yes   List the Names of Other Physician/Practitioners you currently use:  See referral list for the physicians patient is currently seeing.  Dr. Justin Mend   Review of Systems: See above   Objective:     General appearance: Appears stated age and mildly obese  Head: Normocephalic, without obvious abnormality, atraumatic  Eyes: conj clear, EOMi PEERLA  Ears: normal TM's and external ear canals both ears  Nose: Nares normal. Septum midline. Mucosa normal. No drainage or sinus tenderness.  Throat: lips, mucosa, and tongue normal; teeth and gums normal  Neck: no adenopathy, no carotid bruit, no JVD, supple, symmetrical, trachea midline and thyroid not enlarged, symmetric, no tenderness/mass/nodules  No CVA tenderness.  Lungs: clear to auscultation bilaterally  Breasts: normal appearance, no masses or tenderness Heart: regular rate and rhythm, S1, S2 normal, no murmur, click, rub or gallop  Abdomen: soft, non-tender; bowel sounds normal; no masses, no organomegaly  Musculoskeletal: ROM normal in all joints,  no crepitus, no deformity, Normal muscle strengthen. Back  is symmetric, no curvature. Skin: Skin color, texture, turgor normal. No rashes or lesions  Lymph nodes: Cervical, supraclavicular, and axillary nodes normal.  Neurologic: CN 2 -12 Normal, Normal symmetric reflexes. Normal coordination and gait  Psych: Alert & Oriented x 3, Mood appear stable.    Assessment:    Annual wellness medicare exam   Plan:    During the course of the visit the patient was educated and counseled about appropriate screening and preventive services including:   Annual flu vaccine  Prevnar 13     Patient  Instructions (the written plan) was given to the patient.  Medicare Attestation  I have personally reviewed:  The patient's medical and social history  Their use of alcohol, tobacco or illicit drugs  Their current medications and supplements  The patient's functional ability including ADLs,fall risks, home safety risks, cognitive, and hearing and visual impairment  Diet and physical activities  Evidence for depression or mood disorders  The patient's weight, height, BMI, and visual acuity have been recorded in the chart. I have made referrals, counseling, and provided education to the patient based on review of the above and I have provided the patient with a written personalized care plan for preventive services.

## 2015-08-13 DIAGNOSIS — M5032 Other cervical disc degeneration, mid-cervical region, unspecified level: Secondary | ICD-10-CM | POA: Diagnosis not present

## 2015-08-13 DIAGNOSIS — M9902 Segmental and somatic dysfunction of thoracic region: Secondary | ICD-10-CM | POA: Diagnosis not present

## 2015-08-13 DIAGNOSIS — M5414 Radiculopathy, thoracic region: Secondary | ICD-10-CM | POA: Diagnosis not present

## 2015-08-13 DIAGNOSIS — M9901 Segmental and somatic dysfunction of cervical region: Secondary | ICD-10-CM | POA: Diagnosis not present

## 2015-08-13 LAB — MICROALBUMIN, URINE: Microalb, Ur: 1.8 mg/dL

## 2015-08-13 LAB — VITAMIN D 25 HYDROXY (VIT D DEFICIENCY, FRACTURES): Vit D, 25-Hydroxy: 60 ng/mL (ref 30–100)

## 2015-08-13 LAB — HEMOGLOBIN A1C
Hgb A1c MFr Bld: 5.6 % (ref <5.7)
Mean Plasma Glucose: 114 mg/dL

## 2015-08-18 DIAGNOSIS — M9902 Segmental and somatic dysfunction of thoracic region: Secondary | ICD-10-CM | POA: Diagnosis not present

## 2015-08-18 DIAGNOSIS — M5032 Other cervical disc degeneration, mid-cervical region, unspecified level: Secondary | ICD-10-CM | POA: Diagnosis not present

## 2015-08-18 DIAGNOSIS — M9901 Segmental and somatic dysfunction of cervical region: Secondary | ICD-10-CM | POA: Diagnosis not present

## 2015-08-18 DIAGNOSIS — M5414 Radiculopathy, thoracic region: Secondary | ICD-10-CM | POA: Diagnosis not present

## 2015-08-25 DIAGNOSIS — M5032 Other cervical disc degeneration, mid-cervical region, unspecified level: Secondary | ICD-10-CM | POA: Diagnosis not present

## 2015-08-25 DIAGNOSIS — M9902 Segmental and somatic dysfunction of thoracic region: Secondary | ICD-10-CM | POA: Diagnosis not present

## 2015-08-25 DIAGNOSIS — M5414 Radiculopathy, thoracic region: Secondary | ICD-10-CM | POA: Diagnosis not present

## 2015-08-25 DIAGNOSIS — M9901 Segmental and somatic dysfunction of cervical region: Secondary | ICD-10-CM | POA: Diagnosis not present

## 2015-09-10 DIAGNOSIS — M5032 Other cervical disc degeneration, mid-cervical region, unspecified level: Secondary | ICD-10-CM | POA: Diagnosis not present

## 2015-09-10 DIAGNOSIS — M9901 Segmental and somatic dysfunction of cervical region: Secondary | ICD-10-CM | POA: Diagnosis not present

## 2015-09-10 DIAGNOSIS — M9902 Segmental and somatic dysfunction of thoracic region: Secondary | ICD-10-CM | POA: Diagnosis not present

## 2015-09-10 DIAGNOSIS — M5414 Radiculopathy, thoracic region: Secondary | ICD-10-CM | POA: Diagnosis not present

## 2015-09-15 ENCOUNTER — Telehealth: Payer: Self-pay

## 2015-09-15 NOTE — Telephone Encounter (Signed)
Spoke to patient scheduled appt. Based off of patient's work schedule.

## 2015-09-15 NOTE — Telephone Encounter (Signed)
Called patient to schedule PCV 13 injection. No answer. Will try later.

## 2015-09-22 ENCOUNTER — Ambulatory Visit (INDEPENDENT_AMBULATORY_CARE_PROVIDER_SITE_OTHER): Payer: Medicare Other | Admitting: Internal Medicine

## 2015-09-22 DIAGNOSIS — Z23 Encounter for immunization: Secondary | ICD-10-CM | POA: Diagnosis not present

## 2015-09-22 NOTE — Progress Notes (Signed)
Prevnar 13 given today left arm. [facility] [id number]  Lot [id number]  Expiration July 2018

## 2015-09-22 NOTE — Patient Instructions (Signed)
Prevnar 13 administered today without complication.

## 2015-10-29 ENCOUNTER — Other Ambulatory Visit: Payer: Self-pay | Admitting: Internal Medicine

## 2015-11-19 ENCOUNTER — Ambulatory Visit (INDEPENDENT_AMBULATORY_CARE_PROVIDER_SITE_OTHER): Payer: Medicare Other | Admitting: Internal Medicine

## 2015-11-19 DIAGNOSIS — Z23 Encounter for immunization: Secondary | ICD-10-CM | POA: Diagnosis not present

## 2015-12-17 DIAGNOSIS — M9902 Segmental and somatic dysfunction of thoracic region: Secondary | ICD-10-CM | POA: Diagnosis not present

## 2015-12-17 DIAGNOSIS — M5386 Other specified dorsopathies, lumbar region: Secondary | ICD-10-CM | POA: Diagnosis not present

## 2015-12-17 DIAGNOSIS — M5032 Other cervical disc degeneration, mid-cervical region, unspecified level: Secondary | ICD-10-CM | POA: Diagnosis not present

## 2015-12-17 DIAGNOSIS — M9903 Segmental and somatic dysfunction of lumbar region: Secondary | ICD-10-CM | POA: Diagnosis not present

## 2015-12-17 DIAGNOSIS — M5414 Radiculopathy, thoracic region: Secondary | ICD-10-CM | POA: Diagnosis not present

## 2015-12-17 DIAGNOSIS — M9901 Segmental and somatic dysfunction of cervical region: Secondary | ICD-10-CM | POA: Diagnosis not present

## 2015-12-22 DIAGNOSIS — M9902 Segmental and somatic dysfunction of thoracic region: Secondary | ICD-10-CM | POA: Diagnosis not present

## 2015-12-22 DIAGNOSIS — M5032 Other cervical disc degeneration, mid-cervical region, unspecified level: Secondary | ICD-10-CM | POA: Diagnosis not present

## 2015-12-22 DIAGNOSIS — M5414 Radiculopathy, thoracic region: Secondary | ICD-10-CM | POA: Diagnosis not present

## 2015-12-22 DIAGNOSIS — M9901 Segmental and somatic dysfunction of cervical region: Secondary | ICD-10-CM | POA: Diagnosis not present

## 2015-12-22 DIAGNOSIS — M5386 Other specified dorsopathies, lumbar region: Secondary | ICD-10-CM | POA: Diagnosis not present

## 2015-12-22 DIAGNOSIS — M9903 Segmental and somatic dysfunction of lumbar region: Secondary | ICD-10-CM | POA: Diagnosis not present

## 2015-12-31 ENCOUNTER — Other Ambulatory Visit: Payer: Self-pay | Admitting: Internal Medicine

## 2016-01-20 DIAGNOSIS — I83893 Varicose veins of bilateral lower extremities with other complications: Secondary | ICD-10-CM | POA: Diagnosis not present

## 2016-01-20 DIAGNOSIS — I83813 Varicose veins of bilateral lower extremities with pain: Secondary | ICD-10-CM | POA: Diagnosis not present

## 2016-01-21 ENCOUNTER — Other Ambulatory Visit: Payer: Self-pay

## 2016-02-09 ENCOUNTER — Other Ambulatory Visit (INDEPENDENT_AMBULATORY_CARE_PROVIDER_SITE_OTHER): Payer: Medicare Other | Admitting: Internal Medicine

## 2016-02-09 DIAGNOSIS — I10 Essential (primary) hypertension: Secondary | ICD-10-CM | POA: Diagnosis not present

## 2016-02-09 DIAGNOSIS — E119 Type 2 diabetes mellitus without complications: Secondary | ICD-10-CM | POA: Diagnosis not present

## 2016-02-09 DIAGNOSIS — E785 Hyperlipidemia, unspecified: Secondary | ICD-10-CM | POA: Diagnosis not present

## 2016-02-09 LAB — HEPATIC FUNCTION PANEL
ALT: 22 U/L (ref 6–29)
AST: 26 U/L (ref 10–35)
Albumin: 4.2 g/dL (ref 3.6–5.1)
Alkaline Phosphatase: 116 U/L (ref 33–130)
BILIRUBIN DIRECT: 0.1 mg/dL (ref <=0.2)
BILIRUBIN TOTAL: 0.7 mg/dL (ref 0.2–1.2)
Indirect Bilirubin: 0.6 mg/dL (ref 0.2–1.2)
Total Protein: 7 g/dL (ref 6.1–8.1)

## 2016-02-09 LAB — LIPID PANEL
CHOL/HDL RATIO: 5.1 ratio — AB (ref <5.0)
Cholesterol: 178 mg/dL (ref <200)
HDL: 35 mg/dL — ABNORMAL LOW (ref >50)
LDL CALC: 100 mg/dL — AB (ref <100)
Triglycerides: 214 mg/dL — ABNORMAL HIGH (ref <150)
VLDL: 43 mg/dL — AB (ref <30)

## 2016-02-09 LAB — HEMOGLOBIN A1C
HEMOGLOBIN A1C: 5.4 % (ref <5.7)
MEAN PLASMA GLUCOSE: 108 mg/dL

## 2016-02-10 ENCOUNTER — Ambulatory Visit (INDEPENDENT_AMBULATORY_CARE_PROVIDER_SITE_OTHER): Payer: Medicare Other | Admitting: Internal Medicine

## 2016-02-10 ENCOUNTER — Encounter: Payer: Self-pay | Admitting: Internal Medicine

## 2016-02-10 ENCOUNTER — Other Ambulatory Visit: Payer: Medicare Other | Admitting: Internal Medicine

## 2016-02-10 VITALS — BP 138/92 | HR 71 | Temp 98.0°F | Ht 61.0 in | Wt 202.0 lb

## 2016-02-10 DIAGNOSIS — R7302 Impaired glucose tolerance (oral): Secondary | ICD-10-CM | POA: Diagnosis not present

## 2016-02-10 DIAGNOSIS — I10 Essential (primary) hypertension: Secondary | ICD-10-CM

## 2016-02-10 DIAGNOSIS — N183 Chronic kidney disease, stage 3 unspecified: Secondary | ICD-10-CM

## 2016-02-10 DIAGNOSIS — E782 Mixed hyperlipidemia: Secondary | ICD-10-CM

## 2016-02-10 MED ORDER — ROSUVASTATIN CALCIUM 10 MG PO TABS: 10.0000 mg | ORAL_TABLET | Freq: Every day | ORAL | 1 refills | Status: DC

## 2016-02-10 MED ORDER — TOLTERODINE TARTRATE ER 2 MG PO CP24: 2.0000 mg | ORAL_CAPSULE | Freq: Every day | ORAL | 2 refills | Status: DC

## 2016-02-10 NOTE — Progress Notes (Signed)
   Subjective:    Patient ID: Kelly [name], female    DOB: Apr 22, 1945, 70 y.o.   MRN: [id number]  HPI  70 year old Female for 6 month follow up on hyperlipidemia and hypertension. Says blood pressure is elevated and that she's gained some weight. Says she ate too much over the holidays. Now weighs 202 pounds. In June she weighed 193 1/2 pounds. Has been under fair amount of stress working a lot of hours. Sometimes other caretakers did not show up to look after Mr. Richardson Landry.  She has a history of impaired glucose tolerance.  Had Prevnar 26 September 2015.  Review of Systems see above     Objective:   Physical Exam Blood pressure elevated at 138/92. Neck is supple without JVD thyromegaly or carotid bruits. Chest clear to auscultation. Cardiac exam regular rate and rhythm normal S1 and S2. Extremities without edema       Assessment & Plan:  Impaired glucose tolerance-11 A1c excellent at 5.4%  Hyperlipidemia-triglycerides increased from 148-214. LDL cholesterol has increased from 88-100. Patient has not been following a strict diet. Total cholesterol is 178 and previously was 157. Liver functions are normal on statin medication  Hypertension-blood pressure elevated today. She is a bit anxious. She will need to monitor this and let me know if persistently elevated. I think it will respond to diet and exercise.  Plan: She'll be due for physical examination June 2018.

## 2016-02-11 ENCOUNTER — Encounter: Payer: Self-pay | Admitting: Internal Medicine

## 2016-02-11 ENCOUNTER — Ambulatory Visit: Payer: Medicare Other | Admitting: Internal Medicine

## 2016-02-11 ENCOUNTER — Ambulatory Visit (INDEPENDENT_AMBULATORY_CARE_PROVIDER_SITE_OTHER): Payer: Medicare Other | Admitting: Internal Medicine

## 2016-02-11 ENCOUNTER — Other Ambulatory Visit: Payer: Self-pay | Admitting: Internal Medicine

## 2016-02-11 VITALS — BP 144/82 | HR 90 | Resp 12 | Wt 202.0 lb

## 2016-02-11 DIAGNOSIS — R0789 Other chest pain: Secondary | ICD-10-CM

## 2016-02-11 DIAGNOSIS — F411 Generalized anxiety disorder: Secondary | ICD-10-CM

## 2016-02-11 DIAGNOSIS — I10 Essential (primary) hypertension: Secondary | ICD-10-CM | POA: Diagnosis not present

## 2016-02-11 MED ORDER — INDOMETHACIN 50 MG PO CAPS: 50.0000 mg | ORAL_CAPSULE | Freq: Two times a day (BID) | ORAL | 0 refills | Status: DC

## 2016-02-11 NOTE — Telephone Encounter (Signed)
Does not need 90 day supply

## 2016-02-11 NOTE — Progress Notes (Signed)
   Subjective:    Patient ID: Kelly [name], female    DOB: 1945-05-30, 70 y.o.   MRN: [id number]  HPI  She called this morning saying she would like to have her blood pressure rechecked since it was high yesterday. We told her to come by. After arriving, she told staff she had some chest pain. 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  Blood pressure yesterday was 138/92. She weighed 202 pounds. Admitted she had gained some weight over Christmas.  Denies shortness of breath. Blood pressure on arrival was 144/82.  EKG showed no acute changes.  No prior cardiac history. Had physical exam June 2017. At that time she weighed 193 1/2 pounds. She has a history of hyperlipidemia maintained on Crestor.  Mother died of heart disease.  Does not smoke. Social alcohol consumption.         Review of Systems no radiation of chest pain. Pain is in the right parasternal area just below the sternal notch     Objective:   Physical Exam  Palpable right parasternal chest wall tenderness at about the 4th rib adjacent to the sternum. Symptoms consistent with costochondritis. Neck is supple without JVD thyromegaly or carotid bruits. Chest clear to auscultation without rales or wheezing. Cardiac exam regular rate and rhythm normal S1 and S2 without murmurs or gallops. Extremities without edema.  Repeat blood pressure 120/82 after she had calmed daily    Assessment & Plan:  Chest wall pain-costochondritis  Anxiety regarding chest pain  Essential hypertension  Hyperlipidemia  Plan: Indocin 50 mg twice daily for 5-7 days. May put heating pad or ice to chest wall which ever feels better. Rest with no heavy lifting for several days. She is off for the [employer] holiday for couple of days.

## 2016-02-11 NOTE — Patient Instructions (Signed)
Please work on diet and exercise and return in 6 months. Continue same medications and watch blood pressure.

## 2016-02-11 NOTE — Patient Instructions (Signed)
Indocin 50 mg twice daily for approximately one week or until chest pain resolves. Take it easy and rest. No heavy lifting. Blood pressure is acceptable.

## 2016-02-18 ENCOUNTER — Encounter: Payer: Self-pay | Admitting: Internal Medicine

## 2016-02-19 ENCOUNTER — Other Ambulatory Visit: Payer: Self-pay | Admitting: Internal Medicine

## 2016-02-21 NOTE — Congregational Nurse Program (Signed)
Congregational Nurse Program Note  Date of Encounter: 02/21/2016  Past Medical History: Past Medical History:  Diagnosis Date  . Anemia   . Arthritis   . Depression   . Diabetes mellitus    not on meds last [facility] in [facility] 04/20/11   . Hyperlipidemia   . Hypertension   . Renal insufficiency, mild    CKD- stage I per office visit note of PCP     Encounter Details:     CNP Questionnaire - 02/21/16 1227      Patient Demographics   Is this a new or existing patient? New   Patient is considered a/an Not Applicable   Race Caucasian/White     Patient Assistance   Location of Patient Assistance Not Applicable   Patient's financial/insurance status Medicare   Uninsured Patient ([place] [employer]/Care Connects) No   Patient referred to apply for the following financial assistance Not Applicable   Food insecurities addressed Not Applicable   Transportation assistance No   Assistance securing medications No   Educational health offerings Exercise/physical activity     Encounter Details   Primary purpose of visit Education/Health Concerns   Was an Emergency Department visit averted? No   Does patient have a medical provider? Yes   Patient referred to Not Applicable   Was a mental health screening completed? (GAINS tool) No   Does patient have dental issues? No   Does patient have vision issues? No   Does your patient have an abnormal blood pressure today? No   Since previous encounter, have you referred patient for abnormal blood pressure that resulted in a new diagnosis or medication change? No   Does your patient have an abnormal blood glucose today? No   Since previous encounter, have you referred patient for abnormal blood glucose that resulted in a new diagnosis or medication change? No   Was there a life-saving intervention made? No    First time visit to chair yoga class.

## 2016-02-25 DIAGNOSIS — I83892 Varicose veins of left lower extremities with other complications: Secondary | ICD-10-CM | POA: Diagnosis not present

## 2016-02-25 DIAGNOSIS — I83812 Varicose veins of left lower extremities with pain: Secondary | ICD-10-CM | POA: Diagnosis not present

## 2016-02-25 DIAGNOSIS — I83811 Varicose veins of right lower extremities with pain: Secondary | ICD-10-CM | POA: Diagnosis not present

## 2016-04-05 DIAGNOSIS — I83891 Varicose veins of right lower extremities with other complications: Secondary | ICD-10-CM | POA: Diagnosis not present

## 2016-04-05 DIAGNOSIS — I83811 Varicose veins of right lower extremities with pain: Secondary | ICD-10-CM | POA: Diagnosis not present

## 2016-04-05 DIAGNOSIS — I8311 Varicose veins of right lower extremity with inflammation: Secondary | ICD-10-CM | POA: Diagnosis not present

## 2016-04-07 DIAGNOSIS — I8311 Varicose veins of right lower extremity with inflammation: Secondary | ICD-10-CM | POA: Diagnosis not present

## 2016-04-12 ENCOUNTER — Other Ambulatory Visit: Payer: Self-pay | Admitting: Internal Medicine

## 2016-05-05 DIAGNOSIS — I8311 Varicose veins of right lower extremity with inflammation: Secondary | ICD-10-CM | POA: Diagnosis not present

## 2016-05-05 DIAGNOSIS — I83891 Varicose veins of right lower extremities with other complications: Secondary | ICD-10-CM | POA: Diagnosis not present

## 2016-05-09 ENCOUNTER — Other Ambulatory Visit: Payer: Self-pay | Admitting: Internal Medicine

## 2016-05-09 DIAGNOSIS — Z1231 Encounter for screening mammogram for malignant neoplasm of breast: Secondary | ICD-10-CM

## 2016-06-14 DIAGNOSIS — M5414 Radiculopathy, thoracic region: Secondary | ICD-10-CM | POA: Diagnosis not present

## 2016-06-14 DIAGNOSIS — M9902 Segmental and somatic dysfunction of thoracic region: Secondary | ICD-10-CM | POA: Diagnosis not present

## 2016-06-14 DIAGNOSIS — M9901 Segmental and somatic dysfunction of cervical region: Secondary | ICD-10-CM | POA: Diagnosis not present

## 2016-06-14 DIAGNOSIS — M5032 Other cervical disc degeneration, mid-cervical region, unspecified level: Secondary | ICD-10-CM | POA: Diagnosis not present

## 2016-06-14 DIAGNOSIS — M9903 Segmental and somatic dysfunction of lumbar region: Secondary | ICD-10-CM | POA: Diagnosis not present

## 2016-06-14 DIAGNOSIS — M5417 Radiculopathy, lumbosacral region: Secondary | ICD-10-CM | POA: Diagnosis not present

## 2016-06-16 DIAGNOSIS — I83891 Varicose veins of right lower extremities with other complications: Secondary | ICD-10-CM | POA: Diagnosis not present

## 2016-06-16 DIAGNOSIS — M9903 Segmental and somatic dysfunction of lumbar region: Secondary | ICD-10-CM | POA: Diagnosis not present

## 2016-06-16 DIAGNOSIS — M9901 Segmental and somatic dysfunction of cervical region: Secondary | ICD-10-CM | POA: Diagnosis not present

## 2016-06-16 DIAGNOSIS — M9902 Segmental and somatic dysfunction of thoracic region: Secondary | ICD-10-CM | POA: Diagnosis not present

## 2016-06-16 DIAGNOSIS — I8311 Varicose veins of right lower extremity with inflammation: Secondary | ICD-10-CM | POA: Diagnosis not present

## 2016-06-16 DIAGNOSIS — M5414 Radiculopathy, thoracic region: Secondary | ICD-10-CM | POA: Diagnosis not present

## 2016-06-16 DIAGNOSIS — M5032 Other cervical disc degeneration, mid-cervical region, unspecified level: Secondary | ICD-10-CM | POA: Diagnosis not present

## 2016-06-16 DIAGNOSIS — M5417 Radiculopathy, lumbosacral region: Secondary | ICD-10-CM | POA: Diagnosis not present

## 2016-06-21 DIAGNOSIS — M5414 Radiculopathy, thoracic region: Secondary | ICD-10-CM | POA: Diagnosis not present

## 2016-06-21 DIAGNOSIS — M9902 Segmental and somatic dysfunction of thoracic region: Secondary | ICD-10-CM | POA: Diagnosis not present

## 2016-06-21 DIAGNOSIS — M9903 Segmental and somatic dysfunction of lumbar region: Secondary | ICD-10-CM | POA: Diagnosis not present

## 2016-06-21 DIAGNOSIS — M5417 Radiculopathy, lumbosacral region: Secondary | ICD-10-CM | POA: Diagnosis not present

## 2016-06-21 DIAGNOSIS — M9901 Segmental and somatic dysfunction of cervical region: Secondary | ICD-10-CM | POA: Diagnosis not present

## 2016-06-21 DIAGNOSIS — M5032 Other cervical disc degeneration, mid-cervical region, unspecified level: Secondary | ICD-10-CM | POA: Diagnosis not present

## 2016-06-22 ENCOUNTER — Ambulatory Visit: Payer: Medicare Other

## 2016-06-23 ENCOUNTER — Ambulatory Visit: Payer: Medicare Other

## 2016-06-28 DIAGNOSIS — M5417 Radiculopathy, lumbosacral region: Secondary | ICD-10-CM | POA: Diagnosis not present

## 2016-06-28 DIAGNOSIS — M9901 Segmental and somatic dysfunction of cervical region: Secondary | ICD-10-CM | POA: Diagnosis not present

## 2016-06-28 DIAGNOSIS — M5032 Other cervical disc degeneration, mid-cervical region, unspecified level: Secondary | ICD-10-CM | POA: Diagnosis not present

## 2016-06-28 DIAGNOSIS — M9903 Segmental and somatic dysfunction of lumbar region: Secondary | ICD-10-CM | POA: Diagnosis not present

## 2016-06-28 DIAGNOSIS — M9902 Segmental and somatic dysfunction of thoracic region: Secondary | ICD-10-CM | POA: Diagnosis not present

## 2016-06-28 DIAGNOSIS — M5414 Radiculopathy, thoracic region: Secondary | ICD-10-CM | POA: Diagnosis not present

## 2016-06-30 DIAGNOSIS — I83892 Varicose veins of left lower extremities with other complications: Secondary | ICD-10-CM | POA: Diagnosis not present

## 2016-06-30 DIAGNOSIS — I8312 Varicose veins of left lower extremity with inflammation: Secondary | ICD-10-CM | POA: Diagnosis not present

## 2016-07-12 DIAGNOSIS — M5032 Other cervical disc degeneration, mid-cervical region, unspecified level: Secondary | ICD-10-CM | POA: Diagnosis not present

## 2016-07-12 DIAGNOSIS — M5414 Radiculopathy, thoracic region: Secondary | ICD-10-CM | POA: Diagnosis not present

## 2016-07-12 DIAGNOSIS — M9902 Segmental and somatic dysfunction of thoracic region: Secondary | ICD-10-CM | POA: Diagnosis not present

## 2016-07-12 DIAGNOSIS — M5417 Radiculopathy, lumbosacral region: Secondary | ICD-10-CM | POA: Diagnosis not present

## 2016-07-12 DIAGNOSIS — M9903 Segmental and somatic dysfunction of lumbar region: Secondary | ICD-10-CM | POA: Diagnosis not present

## 2016-07-12 DIAGNOSIS — M9901 Segmental and somatic dysfunction of cervical region: Secondary | ICD-10-CM | POA: Diagnosis not present

## 2016-07-27 DIAGNOSIS — I8311 Varicose veins of right lower extremity with inflammation: Secondary | ICD-10-CM | POA: Diagnosis not present

## 2016-08-02 ENCOUNTER — Ambulatory Visit
Admission: RE | Admit: 2016-08-02 | Discharge: 2016-08-02 | Disposition: A | Discharge status: Home / Self Care | Payer: Medicare Other | Source: Ambulatory Visit | Attending: Internal Medicine | Admitting: Internal Medicine

## 2016-08-02 DIAGNOSIS — Z1231 Encounter for screening mammogram for malignant neoplasm of breast: Secondary | ICD-10-CM | POA: Diagnosis not present

## 2016-08-03 ENCOUNTER — Other Ambulatory Visit: Payer: Self-pay | Admitting: Internal Medicine

## 2016-08-03 DIAGNOSIS — R928 Other abnormal and inconclusive findings on diagnostic imaging of breast: Secondary | ICD-10-CM

## 2016-08-09 ENCOUNTER — Ambulatory Visit
Admission: RE | Admit: 2016-08-09 | Discharge: 2016-08-09 | Disposition: A | Discharge status: Home / Self Care | Payer: Medicare Other | Source: Ambulatory Visit | Attending: Internal Medicine | Admitting: Internal Medicine

## 2016-08-09 DIAGNOSIS — R928 Other abnormal and inconclusive findings on diagnostic imaging of breast: Secondary | ICD-10-CM

## 2016-08-22 ENCOUNTER — Other Ambulatory Visit: Payer: Medicare Other | Admitting: Internal Medicine

## 2016-08-22 ENCOUNTER — Other Ambulatory Visit: Payer: Self-pay | Admitting: Internal Medicine

## 2016-08-22 DIAGNOSIS — R921 Mammographic calcification found on diagnostic imaging of breast: Secondary | ICD-10-CM

## 2016-08-24 ENCOUNTER — Encounter: Payer: Federal, State, Local not specified - PPO | Admitting: Internal Medicine

## 2016-10-23 ENCOUNTER — Other Ambulatory Visit: Payer: Medicare Other | Admitting: Internal Medicine

## 2016-10-23 DIAGNOSIS — E785 Hyperlipidemia, unspecified: Secondary | ICD-10-CM | POA: Diagnosis not present

## 2016-10-23 DIAGNOSIS — E118 Type 2 diabetes mellitus with unspecified complications: Secondary | ICD-10-CM

## 2016-10-23 DIAGNOSIS — F329 Major depressive disorder, single episode, unspecified: Secondary | ICD-10-CM

## 2016-10-23 DIAGNOSIS — D509 Iron deficiency anemia, unspecified: Secondary | ICD-10-CM | POA: Diagnosis not present

## 2016-10-23 DIAGNOSIS — F419 Anxiety disorder, unspecified: Secondary | ICD-10-CM

## 2016-10-23 DIAGNOSIS — Z0000 Encounter for general adult medical examination without abnormal findings: Secondary | ICD-10-CM | POA: Diagnosis not present

## 2016-10-23 DIAGNOSIS — I10 Essential (primary) hypertension: Secondary | ICD-10-CM | POA: Diagnosis not present

## 2016-10-23 DIAGNOSIS — F32A Depression, unspecified: Secondary | ICD-10-CM

## 2016-10-24 LAB — COMPLETE METABOLIC PANEL WITH GFR
AG RATIO: 1.4 (calc) (ref 1.0–2.5)
ALT: 12 U/L (ref 6–29)
AST: 16 U/L (ref 10–35)
Albumin: 4 g/dL (ref 3.6–5.1)
Alkaline phosphatase (APISO): 122 U/L (ref 33–130)
BILIRUBIN TOTAL: 0.6 mg/dL (ref 0.2–1.2)
BUN/Creatinine Ratio: 16 (calc) (ref 6–22)
BUN: 19 mg/dL (ref 7–25)
CHLORIDE: 105 mmol/L (ref 98–110)
CO2: 31 mmol/L (ref 20–32)
Calcium: 10.2 mg/dL (ref 8.6–10.4)
Creat: 1.2 mg/dL — ABNORMAL HIGH (ref 0.60–0.93)
GFR, EST AFRICAN AMERICAN: 52 mL/min/{1.73_m2} — AB (ref >=60)
GFR, EST NON AFRICAN AMERICAN: 45 mL/min/{1.73_m2} — AB (ref >=60)
GLOBULIN: 2.8 g/dL (ref 1.9–3.7)
Glucose, Bld: 90 mg/dL (ref 65–99)
POTASSIUM: 5 mmol/L (ref 3.5–5.3)
SODIUM: 139 mmol/L (ref 135–146)
Total Protein: 6.8 g/dL (ref 6.1–8.1)

## 2016-10-24 LAB — CBC WITH DIFFERENTIAL/PLATELET
BASOS ABS: 11 {cells}/uL (ref 0–200)
Basophils Relative: 0.2 %
EOS ABS: 92 {cells}/uL (ref 15–500)
Eosinophils Relative: 1.7 %
HEMATOCRIT: 40.3 % (ref 35.0–45.0)
Hemoglobin: 13.4 g/dL (ref 11.7–15.5)
LYMPHS ABS: 1669 {cells}/uL (ref 850–3900)
MCH: 29.5 pg (ref 27.0–33.0)
MCHC: 33.3 g/dL (ref 32.0–36.0)
MCV: 88.8 fL (ref 80.0–100.0)
MPV: 11.2 fL (ref 7.5–12.5)
Monocytes Relative: 5.6 %
NEUTROS ABS: 3326 {cells}/uL (ref 1500–7800)
NEUTROS PCT: 61.6 %
PLATELETS: 206 10*3/uL (ref 140–400)
RBC: 4.54 10*6/uL (ref 3.80–5.10)
RDW: 13.7 % (ref 11.0–15.0)
Total Lymphocyte: 30.9 %
WBC mixed population: 302 cells/uL (ref 200–950)
WBC: 5.4 10*3/uL (ref 3.8–10.8)

## 2016-10-24 LAB — MICROALBUMIN / CREATININE URINE RATIO
Creatinine, Urine: 133 mg/dL (ref 20–275)
MICROALB UR: 9.7 mg/dL
MICROALB/CREAT RATIO: 73 ug/mg{creat} — AB (ref <30)

## 2016-10-24 LAB — LIPID PANEL
Cholesterol: 174 mg/dL (ref <200)
HDL: 29 mg/dL — AB (ref >50)
LDL Cholesterol (Calc): 109 mg/dL (calc) — ABNORMAL HIGH
NON-HDL CHOLESTEROL (CALC): 145 mg/dL — AB (ref <130)
Total CHOL/HDL Ratio: 6 (calc) — ABNORMAL HIGH (ref <5.0)
Triglycerides: 235 mg/dL — ABNORMAL HIGH (ref <150)

## 2016-10-24 LAB — HEMOGLOBIN A1C
HEMOGLOBIN A1C: 5.5 %{Hb} (ref <5.7)
Mean Plasma Glucose: 111 (calc)
eAG (mmol/L): 6.2 (calc)

## 2016-10-24 LAB — TSH: TSH: 2.57 m[IU]/L (ref 0.40–4.50)

## 2016-10-27 ENCOUNTER — Encounter: Payer: Medicare Other | Admitting: Internal Medicine

## 2016-10-31 ENCOUNTER — Ambulatory Visit (INDEPENDENT_AMBULATORY_CARE_PROVIDER_SITE_OTHER): Payer: Medicare Other | Admitting: Internal Medicine

## 2016-10-31 ENCOUNTER — Other Ambulatory Visit: Payer: Self-pay | Admitting: Internal Medicine

## 2016-10-31 ENCOUNTER — Encounter: Payer: Self-pay | Admitting: Internal Medicine

## 2016-10-31 VITALS — BP 120/82 | HR 73 | Temp 97.3°F | Ht 60.5 in | Wt 207.0 lb

## 2016-10-31 DIAGNOSIS — R7989 Other specified abnormal findings of blood chemistry: Secondary | ICD-10-CM

## 2016-10-31 DIAGNOSIS — K5900 Constipation, unspecified: Secondary | ICD-10-CM | POA: Diagnosis not present

## 2016-10-31 DIAGNOSIS — N183 Chronic kidney disease, stage 3 unspecified: Secondary | ICD-10-CM | POA: Insufficient documentation

## 2016-10-31 DIAGNOSIS — E782 Mixed hyperlipidemia: Secondary | ICD-10-CM | POA: Diagnosis not present

## 2016-10-31 DIAGNOSIS — N3941 Urge incontinence: Secondary | ICD-10-CM | POA: Diagnosis not present

## 2016-10-31 DIAGNOSIS — Z23 Encounter for immunization: Secondary | ICD-10-CM

## 2016-10-31 DIAGNOSIS — R7302 Impaired glucose tolerance (oral): Secondary | ICD-10-CM | POA: Diagnosis not present

## 2016-10-31 DIAGNOSIS — I10 Essential (primary) hypertension: Secondary | ICD-10-CM | POA: Diagnosis not present

## 2016-10-31 DIAGNOSIS — Z0000 Encounter for general adult medical examination without abnormal findings: Secondary | ICD-10-CM

## 2016-10-31 LAB — BASIC METABOLIC PANEL WITH GFR
BUN / CREAT RATIO: 12 (calc) (ref 6–22)
BUN: 15 mg/dL (ref 7–25)
CALCIUM: 9.9 mg/dL (ref 8.6–10.4)
CHLORIDE: 102 mmol/L (ref 98–110)
CO2: 28 mmol/L (ref 20–32)
Creat: 1.25 mg/dL — ABNORMAL HIGH (ref 0.60–0.93)
GFR, EST AFRICAN AMERICAN: 50 mL/min/{1.73_m2} — AB (ref >=60)
GFR, Est Non African American: 43 mL/min/{1.73_m2} — ABNORMAL LOW (ref >=60)
Glucose, Bld: 102 mg/dL — ABNORMAL HIGH (ref 65–99)
Potassium: 3.7 mmol/L (ref 3.5–5.3)
Sodium: 140 mmol/L (ref 135–146)

## 2016-10-31 LAB — POCT URINALYSIS DIPSTICK
Bilirubin, UA: NEGATIVE
Glucose, UA: NEGATIVE
KETONES UA: NEGATIVE
LEUKOCYTES UA: NEGATIVE
Nitrite, UA: NEGATIVE
PH UA: 7 (ref 5.0–8.0)
PROTEIN UA: NEGATIVE
RBC UA: NEGATIVE
SPEC GRAV UA: 1.025 (ref 1.010–1.025)
Urobilinogen, UA: 0.2 E.U./dL

## 2016-10-31 LAB — EXTRA LAV TOP TUBE

## 2016-10-31 MED ORDER — OXYBUTYNIN CHLORIDE ER 10 MG PO TB24: 10.0000 mg | ORAL_TABLET | Freq: Every day | ORAL | 1 refills | Status: DC

## 2016-10-31 NOTE — Progress Notes (Signed)
Subjective:    Patient ID: Kelly [name], female    DOB: Jan 14, 1946, 71 y.o.   MRN: [id number]  HPI 71 year old female in today for health maintenance exam, Medicare wellness exam and evaluation of medical issues. She has a history of hyperlipidemia maintained on generic Crestor. History of essential hypertension and impaired glucose tolerance without complication. History of elevated serum creatinine.  Recent creatinine was 1.2 and was 1.09 in 2017. 2 years ago was 1.20. In 2015 was 1.57. In 2014 it was 1.67.  Patient has had lots of plastic surgery in 2002 and 2003 including abdominoplasty, thigh liposuction, mastopexy (breast reduction) brachioplasties. 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 Fredna Dow in 2011  History of iron deficiency anemia which resolved after stopping anti-inflammatory medication.  She had colonoscopy March 2012 which was normal.  History of right carpal tunnel syndrome.  Total left knee replacement 2013. Right knee arthroscopic surgery 2007. Cholecystectomy 1965. Hysterectomy without oophorectomy at age 83. Right knee replacement by Dr. Ninfa Linden 2016.  Social history: Patient was raised in an orphanage in [place]. Patient's daughter died of metastatic ovarian cancer. Patient was left with a lot of bills to pay for her daughter. 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 She is retired from the [employer]. 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 Drinks 2 glasses of wine daily. Does not smoke.  Family history: Father died of alcoholism. Mother died of heart disease. One sister with history of lung cancer still living. 3 other sisters in good health.    Review of Systems  Constitutional: Positive for fatigue.  HENT: Negative.   Respiratory: Negative.   Cardiovascular:       Right anterior chest pain with palpation    Gastrointestinal:       Constipation-to try MiraLAX  Genitourinary:       Urge urinary incontinence not responsive to Detrol  Musculoskeletal: Positive for arthralgias.  Neurological: Negative.   Psychiatric/Behavioral: Negative.        Objective:   Physical Exam  Constitutional: She is oriented to person, place, and time. She appears well-developed and well-nourished. No distress.  HENT:  Head: Normocephalic and atraumatic.  Right Ear: External ear normal.  Left Ear: External ear normal.  Mouth/Throat: Oropharynx is clear and moist.  Eyes: Pupils are equal, round, and reactive to light. Conjunctivae and EOM are normal. Right eye exhibits no discharge. Left eye exhibits no discharge. No scleral icterus.  Neck: Neck supple. No JVD present. No thyromegaly present.  Cardiovascular: Normal rate, regular rhythm, normal heart sounds and intact distal pulses.   No murmur heard. Pulmonary/Chest: Effort normal and breath sounds normal. No respiratory distress. She has no wheezes. She exhibits no tenderness.  Breast without masses  Abdominal: Soft. Bowel sounds are normal. She exhibits no distension and no mass. There is no tenderness. There is no rebound and no guarding.  Genitourinary:  Genitourinary Comments: Bimanual normal, Pap deferred due to age and has had hysterectomy without oophorectomy..  Musculoskeletal: She exhibits no edema.  Lymphadenopathy:    She has no cervical adenopathy.  Neurological: She is alert and oriented to person, place, and time. She has normal reflexes. No cranial nerve deficit. Coordination normal.  Skin: Skin is warm and dry. No rash noted. She is not diaphoretic.  Psychiatric: She has a normal mood and affect. Her behavior is  normal. Judgment and thought content normal.  Vitals reviewed.         Assessment & Plan:  Elevated serum creatinine-stable at 1.2 compared to several years ago when it was higher. We did repeat it today when she is nonfasting.  Has seen Dr. Justin Mend for stage III chronic kidney disease.  Hyperlipidemia  Essential hypertension  Impaired glucose tolerance-hemoglobin A1c is normal  Right chest wall pain-is persistent. I don't want her taking anti-inflammatory medications because of her creatinine. She may take Tylenol and apply ice to it.  Urge urinary incontinence-change Detrol for plan. If not improving will need to refer to urologist. I'm not sure she can afford estrogen cream.  Plan: Follow-up in 6 months. Repeat creatinine done today. Recommend annual mammogram. Flu shot given.  Subjective:   Patient presents for Medicare Annual/Subsequent preventive examination.  Review Past Medical/Family/Social:   Risk Factors  Current exercise habits: Gardening Dietary issues discussed: Low fat low carbohydrate  Cardiac risk factors: Hyperlipidemia  Depression Screen  (Note: if answer to either of the following is "Yes", a more complete depression screening is indicated)   Over the past two weeks, have you felt down, depressed or hopeless? No  Over the past two weeks, have you felt little interest or pleasure in doing things? No Have you lost interest or pleasure in daily life? No Do you often feel hopeless? No Do you cry easily over simple problems? No   Activities of Daily Living  In your present state of health, do you have any difficulty performing the following activities?:   Driving? No  Managing money? No  Feeding yourself? No  Getting from bed to chair? No  Climbing a flight of stairs? No  Preparing food and eating?: No  Bathing or showering? No  Getting dressed: No  Getting to the toilet? Yes due to urge incontinence Using the toilet:No  Moving around from place to place: No  In the past year have you fallen or had a near fall?:No  Are you sexually active? No  Do you have more than one partner? No   Hearing Difficulties: No  Do you often ask people to speak up or repeat themselves? No  Do  you experience ringing or noises in your ears? No  Do you have difficulty understanding soft or whispered voices? No  Do you feel that you have a problem with memory? No Do you often misplace items? No    Home Safety:  Do you have a smoke alarm at your residence? Yes Do you have grab bars in the bathroom?Yes Do you have throw rugs in your house? Yes   Cognitive Testing  Alert? Yes Normal Appearance?Yes  Oriented to person? Yes Place? Yes  Time? Yes  Recall of three objects? Yes  Can perform simple calculations? Yes  Displays appropriate judgment?Yes  Can read the correct time from a watch face?Yes   List the Names of Other Physician/Practitioners you currently use:  See referral list for the physicians patient is currently seeing.     Review of Systems: See above   Objective:     General appearance: Appears younger than stated age and mildly obese  Head: Normocephalic, without obvious abnormality, atraumatic  Eyes: conj clear, EOMi PEERLA  Ears: normal TM's and external ear canals both ears  Nose: Nares normal. Septum midline. Mucosa normal. No drainage or sinus tenderness.  Throat: lips, mucosa, and tongue normal; teeth and gums normal  Neck: no adenopathy, no carotid bruit, no JVD, supple,  symmetrical, trachea midline and thyroid not enlarged, symmetric, no tenderness/mass/nodules  No CVA tenderness.  Lungs: clear to auscultation bilaterally  Breasts: normal appearance, no masses or tenderness Heart: regular rate and rhythm, S1, S2 normal, no murmur, click, rub or gallop  Abdomen: soft, non-tender; bowel sounds normal; no masses, no organomegaly  Musculoskeletal: ROM normal in all joints, no crepitus, no deformity, Normal muscle strengthen. Back  is symmetric, no curvature. Skin: Skin color, texture, turgor normal. No rashes or lesions  Lymph nodes: Cervical, supraclavicular, and axillary nodes normal.  Neurologic: CN 2 -12 Normal, Normal symmetric reflexes. Normal  coordination and gait  Psych: Alert & Oriented x 3, Mood appear stable.    Assessment:    Annual wellness medicare exam   Plan:    During the course of the visit the patient was educated and counseled about appropriate screening and preventive services including:   See above  Flu shot given     Patient Instructions (the written plan) was given to the patient.  Medicare Attestation  I have personally reviewed:  The patient's medical and social history  Their use of alcohol, tobacco or illicit drugs  Their current medications and supplements  The patient's functional ability including ADLs,fall risks, home safety risks, cognitive, and hearing and visual impairment  Diet and physical activities  Evidence for depression or mood disorders  The patient's weight, height, BMI, and visual acuity have been recorded in the chart. I have made referrals, counseling, and provided education to the patient based on review of the above and I have provided the patient with a written personalized care plan for preventive services.

## 2016-10-31 NOTE — Patient Instructions (Signed)
Continues to avoid Advil and Aleve products because of kidney disease. Flu vaccine given. Watch diet. Try to exercise. Return in 6 months. Flu vaccine given.

## 2016-10-31 NOTE — Telephone Encounter (Signed)
Do not want to give 90 days until see if works so please call pharmacy

## 2017-01-15 DIAGNOSIS — M9903 Segmental and somatic dysfunction of lumbar region: Secondary | ICD-10-CM | POA: Diagnosis not present

## 2017-01-15 DIAGNOSIS — M5386 Other specified dorsopathies, lumbar region: Secondary | ICD-10-CM | POA: Diagnosis not present

## 2017-01-15 DIAGNOSIS — M9902 Segmental and somatic dysfunction of thoracic region: Secondary | ICD-10-CM | POA: Diagnosis not present

## 2017-01-15 DIAGNOSIS — M9901 Segmental and somatic dysfunction of cervical region: Secondary | ICD-10-CM | POA: Diagnosis not present

## 2017-01-15 DIAGNOSIS — M5414 Radiculopathy, thoracic region: Secondary | ICD-10-CM | POA: Diagnosis not present

## 2017-01-15 DIAGNOSIS — M5032 Other cervical disc degeneration, mid-cervical region, unspecified level: Secondary | ICD-10-CM | POA: Diagnosis not present

## 2017-01-29 DIAGNOSIS — M9901 Segmental and somatic dysfunction of cervical region: Secondary | ICD-10-CM | POA: Diagnosis not present

## 2017-01-29 DIAGNOSIS — M5414 Radiculopathy, thoracic region: Secondary | ICD-10-CM | POA: Diagnosis not present

## 2017-01-29 DIAGNOSIS — M9902 Segmental and somatic dysfunction of thoracic region: Secondary | ICD-10-CM | POA: Diagnosis not present

## 2017-01-29 DIAGNOSIS — M9903 Segmental and somatic dysfunction of lumbar region: Secondary | ICD-10-CM | POA: Diagnosis not present

## 2017-01-29 DIAGNOSIS — M5032 Other cervical disc degeneration, mid-cervical region, unspecified level: Secondary | ICD-10-CM | POA: Diagnosis not present

## 2017-01-29 DIAGNOSIS — M5386 Other specified dorsopathies, lumbar region: Secondary | ICD-10-CM | POA: Diagnosis not present

## 2017-02-16 ENCOUNTER — Other Ambulatory Visit: Payer: Self-pay | Admitting: Internal Medicine

## 2017-02-16 ENCOUNTER — Ambulatory Visit
Admission: RE | Admit: 2017-02-16 | Discharge: 2017-02-16 | Disposition: A | Discharge status: Home / Self Care | Payer: Medicare Other | Source: Ambulatory Visit | Attending: Internal Medicine | Admitting: Internal Medicine

## 2017-02-16 DIAGNOSIS — R921 Mammographic calcification found on diagnostic imaging of breast: Secondary | ICD-10-CM

## 2017-03-06 ENCOUNTER — Other Ambulatory Visit: Payer: Self-pay | Admitting: Internal Medicine

## 2017-04-23 ENCOUNTER — Other Ambulatory Visit: Payer: Self-pay | Admitting: Internal Medicine

## 2017-04-27 ENCOUNTER — Other Ambulatory Visit: Payer: Medicare Other | Admitting: Internal Medicine

## 2017-04-27 DIAGNOSIS — R7302 Impaired glucose tolerance (oral): Secondary | ICD-10-CM

## 2017-04-27 DIAGNOSIS — I10 Essential (primary) hypertension: Secondary | ICD-10-CM | POA: Diagnosis not present

## 2017-04-27 DIAGNOSIS — E785 Hyperlipidemia, unspecified: Secondary | ICD-10-CM | POA: Diagnosis not present

## 2017-04-28 LAB — BASIC METABOLIC PANEL
BUN/Creatinine Ratio: 13 (calc) (ref 6–22)
BUN: 20 mg/dL (ref 7–25)
CHLORIDE: 104 mmol/L (ref 98–110)
CO2: 31 mmol/L (ref 20–32)
CREATININE: 1.5 mg/dL — AB (ref 0.60–0.93)
Calcium: 9.5 mg/dL (ref 8.6–10.4)
Glucose, Bld: 86 mg/dL (ref 65–99)
POTASSIUM: 3.8 mmol/L (ref 3.5–5.3)
SODIUM: 141 mmol/L (ref 135–146)

## 2017-04-28 LAB — HEMOGLOBIN A1C
HEMOGLOBIN A1C: 5.6 %{Hb} (ref <5.7)
Mean Plasma Glucose: 114 (calc)
eAG (mmol/L): 6.3 (calc)

## 2017-04-28 LAB — LIPID PANEL
CHOL/HDL RATIO: 4.7 (calc) (ref <5.0)
Cholesterol: 159 mg/dL (ref <200)
HDL: 34 mg/dL — AB (ref >50)
LDL CHOLESTEROL (CALC): 100 mg/dL — AB
Non-HDL Cholesterol (Calc): 125 mg/dL (calc) (ref <130)
Triglycerides: 151 mg/dL — ABNORMAL HIGH (ref <150)

## 2017-04-28 LAB — HEPATIC FUNCTION PANEL
AG Ratio: 1.6 (calc) (ref 1.0–2.5)
ALBUMIN MSPROF: 4 g/dL (ref 3.6–5.1)
ALT: 21 U/L (ref 6–29)
AST: 24 U/L (ref 10–35)
Alkaline phosphatase (APISO): 125 U/L (ref 33–130)
BILIRUBIN TOTAL: 0.7 mg/dL (ref 0.2–1.2)
Bilirubin, Direct: 0.1 mg/dL (ref 0.0–0.2)
GLOBULIN: 2.5 g/dL (ref 1.9–3.7)
Indirect Bilirubin: 0.6 mg/dL (calc) (ref 0.2–1.2)
TOTAL PROTEIN: 6.5 g/dL (ref 6.1–8.1)

## 2017-04-30 ENCOUNTER — Ambulatory Visit: Payer: Medicare Other | Admitting: Internal Medicine

## 2017-05-01 ENCOUNTER — Ambulatory Visit: Payer: Medicare Other | Admitting: Internal Medicine

## 2017-05-04 ENCOUNTER — Ambulatory Visit (INDEPENDENT_AMBULATORY_CARE_PROVIDER_SITE_OTHER): Payer: Medicare Other | Admitting: Internal Medicine

## 2017-05-04 ENCOUNTER — Encounter: Payer: Self-pay | Admitting: Internal Medicine

## 2017-05-04 VITALS — BP 122/90 | HR 72 | Ht 60.5 in | Wt 204.0 lb

## 2017-05-04 DIAGNOSIS — F439 Reaction to severe stress, unspecified: Secondary | ICD-10-CM

## 2017-05-04 DIAGNOSIS — I10 Essential (primary) hypertension: Secondary | ICD-10-CM

## 2017-05-04 DIAGNOSIS — F458 Other somatoform disorders: Secondary | ICD-10-CM

## 2017-05-04 DIAGNOSIS — N183 Chronic kidney disease, stage 3 unspecified: Secondary | ICD-10-CM

## 2017-05-04 DIAGNOSIS — N3941 Urge incontinence: Secondary | ICD-10-CM | POA: Diagnosis not present

## 2017-05-04 DIAGNOSIS — R7302 Impaired glucose tolerance (oral): Secondary | ICD-10-CM | POA: Diagnosis not present

## 2017-05-04 DIAGNOSIS — E782 Mixed hyperlipidemia: Secondary | ICD-10-CM | POA: Diagnosis not present

## 2017-05-04 DIAGNOSIS — K580 Irritable bowel syndrome with diarrhea: Secondary | ICD-10-CM | POA: Diagnosis not present

## 2017-05-04 DIAGNOSIS — R0989 Other specified symptoms and signs involving the circulatory and respiratory systems: Secondary | ICD-10-CM

## 2017-05-04 DIAGNOSIS — R7989 Other specified abnormal findings of blood chemistry: Secondary | ICD-10-CM | POA: Diagnosis not present

## 2017-05-04 MED ORDER — ALPRAZOLAM 0.5 MG PO TABS: ORAL_TABLET | ORAL | 1 refills | Status: DC

## 2017-05-11 ENCOUNTER — Encounter: Payer: Self-pay | Admitting: Internal Medicine

## 2017-05-11 NOTE — Patient Instructions (Signed)
Try Imodium or Xanax for irritable bowel syndrome.  Continue Xanax for stress.  Continue Norvasc for hypertension and Crestor for hyperlipidemia.  Continue to watch diet for impaired glucose tolerance.  Return in 6 months.  Xanax will treat globus sensation but may try PPI.  Let me know if symptoms do not improve.

## 2017-05-11 NOTE — Progress Notes (Signed)
   Subjective:    Patient ID: Kelly [name], female    DOB: 1946-01-28, 72 y.o.   MRN: [id number]  HPI 72 year old Female in today for 61-month recheck.  Situational stress with sister who is in poor health and requiring a lot of attention.  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  She is been having some issues lately that are symptoms of stress in my opinion.  She has a globus type sensation in her throat.  She does not take PPI but can try that to see if it will help symptoms.  She does have Xanax for anxiety.  She has a history of hypertension and chronic kidney disease as well as hyperlipidemia.  Her triglycerides of actually improved from 235-151.  Total cholesterol and LDL cholesterol values are stable.  She has impaired glucose tolerance but A1c recently checked was normal at 5.6%.  Liver panel is normal.  Creatinine has increased a bit from 1.25-1.50.  It will continue to be monitored.  Potassium is normal.  Also having some issues with diarrhea from time to time but sounds like irritable bowel symptoms.  Review of Systems see above     Objective:   Physical Exam Skin warm and dry.  Nodes none.  Chest clear.  Neck is supple.  No JVD or thyromegaly.    Abdomen: No hepatosplenomegaly masses or tenderness       Assessment & Plan:  Irritable bowel syndrome-can be treated with Imodium or Xanax  Chronic kidney disease-continue to monitor-slight increase noted in creatinine  Globus sensation-treat with Xanax  Impaired glucose tolerance-stable with diet alone  Hypertension stable on current regimen-amlodipine  Hyperlipidemia-stable on current regimen of Crestor  Plan: Return in 6 months or as needed.  She will let me know if the symptoms do not improve

## 2017-06-07 ENCOUNTER — Other Ambulatory Visit: Payer: Self-pay | Admitting: Internal Medicine

## 2017-07-11 DIAGNOSIS — M5032 Other cervical disc degeneration, mid-cervical region, unspecified level: Secondary | ICD-10-CM | POA: Diagnosis not present

## 2017-07-11 DIAGNOSIS — M5414 Radiculopathy, thoracic region: Secondary | ICD-10-CM | POA: Diagnosis not present

## 2017-07-11 DIAGNOSIS — M9903 Segmental and somatic dysfunction of lumbar region: Secondary | ICD-10-CM | POA: Diagnosis not present

## 2017-07-11 DIAGNOSIS — M9901 Segmental and somatic dysfunction of cervical region: Secondary | ICD-10-CM | POA: Diagnosis not present

## 2017-07-11 DIAGNOSIS — M5386 Other specified dorsopathies, lumbar region: Secondary | ICD-10-CM | POA: Diagnosis not present

## 2017-07-11 DIAGNOSIS — M9902 Segmental and somatic dysfunction of thoracic region: Secondary | ICD-10-CM | POA: Diagnosis not present

## 2017-07-13 DIAGNOSIS — M9901 Segmental and somatic dysfunction of cervical region: Secondary | ICD-10-CM | POA: Diagnosis not present

## 2017-07-13 DIAGNOSIS — M9903 Segmental and somatic dysfunction of lumbar region: Secondary | ICD-10-CM | POA: Diagnosis not present

## 2017-07-13 DIAGNOSIS — M5032 Other cervical disc degeneration, mid-cervical region, unspecified level: Secondary | ICD-10-CM | POA: Diagnosis not present

## 2017-07-13 DIAGNOSIS — M5414 Radiculopathy, thoracic region: Secondary | ICD-10-CM | POA: Diagnosis not present

## 2017-07-13 DIAGNOSIS — M9902 Segmental and somatic dysfunction of thoracic region: Secondary | ICD-10-CM | POA: Diagnosis not present

## 2017-07-13 DIAGNOSIS — M5386 Other specified dorsopathies, lumbar region: Secondary | ICD-10-CM | POA: Diagnosis not present

## 2017-07-18 DIAGNOSIS — M9901 Segmental and somatic dysfunction of cervical region: Secondary | ICD-10-CM | POA: Diagnosis not present

## 2017-07-18 DIAGNOSIS — M9903 Segmental and somatic dysfunction of lumbar region: Secondary | ICD-10-CM | POA: Diagnosis not present

## 2017-07-18 DIAGNOSIS — M5032 Other cervical disc degeneration, mid-cervical region, unspecified level: Secondary | ICD-10-CM | POA: Diagnosis not present

## 2017-07-18 DIAGNOSIS — M5414 Radiculopathy, thoracic region: Secondary | ICD-10-CM | POA: Diagnosis not present

## 2017-07-18 DIAGNOSIS — M9902 Segmental and somatic dysfunction of thoracic region: Secondary | ICD-10-CM | POA: Diagnosis not present

## 2017-07-18 DIAGNOSIS — M5386 Other specified dorsopathies, lumbar region: Secondary | ICD-10-CM | POA: Diagnosis not present

## 2017-07-23 ENCOUNTER — Other Ambulatory Visit: Payer: Self-pay

## 2017-07-23 DIAGNOSIS — M5386 Other specified dorsopathies, lumbar region: Secondary | ICD-10-CM | POA: Diagnosis not present

## 2017-07-23 DIAGNOSIS — M9901 Segmental and somatic dysfunction of cervical region: Secondary | ICD-10-CM | POA: Diagnosis not present

## 2017-07-23 DIAGNOSIS — M5032 Other cervical disc degeneration, mid-cervical region, unspecified level: Secondary | ICD-10-CM | POA: Diagnosis not present

## 2017-07-23 DIAGNOSIS — M9902 Segmental and somatic dysfunction of thoracic region: Secondary | ICD-10-CM | POA: Diagnosis not present

## 2017-07-23 DIAGNOSIS — M9903 Segmental and somatic dysfunction of lumbar region: Secondary | ICD-10-CM | POA: Diagnosis not present

## 2017-07-23 DIAGNOSIS — M5414 Radiculopathy, thoracic region: Secondary | ICD-10-CM | POA: Diagnosis not present

## 2017-07-23 DIAGNOSIS — R7989 Other specified abnormal findings of blood chemistry: Secondary | ICD-10-CM

## 2017-07-28 ENCOUNTER — Other Ambulatory Visit: Payer: Self-pay | Admitting: Internal Medicine

## 2017-08-07 ENCOUNTER — Other Ambulatory Visit: Payer: Medicare Other | Admitting: Internal Medicine

## 2017-08-08 ENCOUNTER — Other Ambulatory Visit (INDEPENDENT_AMBULATORY_CARE_PROVIDER_SITE_OTHER): Payer: Medicare Other | Admitting: Internal Medicine

## 2017-08-08 DIAGNOSIS — R7989 Other specified abnormal findings of blood chemistry: Secondary | ICD-10-CM | POA: Diagnosis not present

## 2017-08-08 LAB — CREATININE, SERUM: CREATININE: 1.33 mg/dL — AB (ref 0.60–0.93)

## 2017-08-10 ENCOUNTER — Ambulatory Visit (INDEPENDENT_AMBULATORY_CARE_PROVIDER_SITE_OTHER): Payer: Medicare Other | Admitting: Internal Medicine

## 2017-08-10 ENCOUNTER — Encounter: Payer: Self-pay | Admitting: Internal Medicine

## 2017-08-10 VITALS — BP 110/80 | HR 77 | Ht 60.5 in | Wt 199.0 lb

## 2017-08-10 DIAGNOSIS — M9902 Segmental and somatic dysfunction of thoracic region: Secondary | ICD-10-CM | POA: Diagnosis not present

## 2017-08-10 DIAGNOSIS — I10 Essential (primary) hypertension: Secondary | ICD-10-CM

## 2017-08-10 DIAGNOSIS — M5414 Radiculopathy, thoracic region: Secondary | ICD-10-CM | POA: Diagnosis not present

## 2017-08-10 DIAGNOSIS — N183 Chronic kidney disease, stage 3 unspecified: Secondary | ICD-10-CM

## 2017-08-10 DIAGNOSIS — M9903 Segmental and somatic dysfunction of lumbar region: Secondary | ICD-10-CM | POA: Diagnosis not present

## 2017-08-10 DIAGNOSIS — M5032 Other cervical disc degeneration, mid-cervical region, unspecified level: Secondary | ICD-10-CM | POA: Diagnosis not present

## 2017-08-10 DIAGNOSIS — M9901 Segmental and somatic dysfunction of cervical region: Secondary | ICD-10-CM | POA: Diagnosis not present

## 2017-08-10 DIAGNOSIS — R7989 Other specified abnormal findings of blood chemistry: Secondary | ICD-10-CM | POA: Diagnosis not present

## 2017-08-10 DIAGNOSIS — M5386 Other specified dorsopathies, lumbar region: Secondary | ICD-10-CM | POA: Diagnosis not present

## 2017-08-10 NOTE — Progress Notes (Signed)
   Subjective:    Patient ID: Kelly [name], female    DOB: 1945-04-12, 72 y.o.   MRN: [id number]  HPI 72 year old Female for follow up of CKD. Creatinine improved from 1.50 3 months ago to 1.33.  Used to take a lot of NSAIDs for musculoskeletal pain.  Also history of impaired glucose tolerance and hypertension.  Says she is been hypertensive for many years even as a young adult.  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  She is working in her yard and enjoying herself.  She looks more rested  Hx HTN  and BP stable at 110/80.     Review of Systems-no new complaints     Objective:   Physical Exam Skin warm and dry.  Nodes none.  Chest clear.  Cardiac exam regular rate and rhythm.  Extremities without edema.       Assessment & Plan:  Essential hypertension  Chronic kidney disease stage III  History of impaired glucose tolerance with recent hemoglobin A1c's being normal  Plan: She has a trip planned for late October.  She will schedule her physical exam for early October here.  Continue same medications.

## 2017-08-10 NOTE — Patient Instructions (Signed)
Continue same medications and return in early October for physical exam.

## 2017-08-17 ENCOUNTER — Ambulatory Visit
Admission: RE | Admit: 2017-08-17 | Discharge: 2017-08-17 | Disposition: A | Discharge status: Home / Self Care | Payer: Medicare Other | Source: Ambulatory Visit | Attending: Internal Medicine | Admitting: Internal Medicine

## 2017-08-17 DIAGNOSIS — R921 Mammographic calcification found on diagnostic imaging of breast: Secondary | ICD-10-CM

## 2017-08-21 ENCOUNTER — Other Ambulatory Visit: Payer: Self-pay | Admitting: Internal Medicine

## 2017-09-10 ENCOUNTER — Other Ambulatory Visit: Payer: Self-pay | Admitting: Internal Medicine

## 2017-09-17 ENCOUNTER — Telehealth: Payer: Self-pay

## 2017-09-17 NOTE — Telephone Encounter (Signed)
Patient called states the OXYBUTYNIN is causing her mouth to be extremely dried and does not want to take this medication at all. She would like to know if something else can be prescribed for her bladder.

## 2017-09-17 NOTE — Telephone Encounter (Signed)
Referral faxed to [facility] with documentation/demographic information @ [phone].

## 2017-09-17 NOTE — Telephone Encounter (Signed)
Refer patient to Urologist per Dr. Renold Genta.  Spoke with patient and advised that we will be happy to make referral.  Suggested patient continue medication until referral can be made as we cannot get any other medications approved through insurance hence the referral to Urology.    Patient advised that she is not going to take any more of this medication because it makes her feel so poorly.  States that she will put up with getting up and going to the bathroom until she can get in to Urologist.  Advised that I will get busy on her referral to Urology this evening.

## 2017-09-17 NOTE — Telephone Encounter (Signed)
All of these meds cause dry mouth- that is a side effect. Does it work?

## 2017-09-21 NOTE — Telephone Encounter (Signed)
Referral placed in Epic, pt has appointment scheduled with Dr Pilar Jarvis on 09/24/2017 @ 10:00 am

## 2017-09-24 DIAGNOSIS — N3281 Overactive bladder: Secondary | ICD-10-CM | POA: Diagnosis not present

## 2017-10-19 DIAGNOSIS — M9903 Segmental and somatic dysfunction of lumbar region: Secondary | ICD-10-CM | POA: Diagnosis not present

## 2017-10-19 DIAGNOSIS — M9902 Segmental and somatic dysfunction of thoracic region: Secondary | ICD-10-CM | POA: Diagnosis not present

## 2017-10-19 DIAGNOSIS — M5386 Other specified dorsopathies, lumbar region: Secondary | ICD-10-CM | POA: Diagnosis not present

## 2017-10-19 DIAGNOSIS — M9901 Segmental and somatic dysfunction of cervical region: Secondary | ICD-10-CM | POA: Diagnosis not present

## 2017-10-19 DIAGNOSIS — M5414 Radiculopathy, thoracic region: Secondary | ICD-10-CM | POA: Diagnosis not present

## 2017-10-19 DIAGNOSIS — M5032 Other cervical disc degeneration, mid-cervical region, unspecified level: Secondary | ICD-10-CM | POA: Diagnosis not present

## 2017-10-29 ENCOUNTER — Ambulatory Visit (INDEPENDENT_AMBULATORY_CARE_PROVIDER_SITE_OTHER): Payer: Medicare Other | Admitting: Orthopaedic Surgery

## 2017-10-29 ENCOUNTER — Ambulatory Visit (INDEPENDENT_AMBULATORY_CARE_PROVIDER_SITE_OTHER): Payer: Medicare Other

## 2017-10-29 ENCOUNTER — Encounter (INDEPENDENT_AMBULATORY_CARE_PROVIDER_SITE_OTHER): Payer: Self-pay | Admitting: Orthopaedic Surgery

## 2017-10-29 DIAGNOSIS — M25561 Pain in right knee: Secondary | ICD-10-CM | POA: Diagnosis not present

## 2017-10-29 DIAGNOSIS — R8279 Other abnormal findings on microbiological examination of urine: Secondary | ICD-10-CM | POA: Diagnosis not present

## 2017-10-29 DIAGNOSIS — Z96651 Presence of right artificial knee joint: Secondary | ICD-10-CM | POA: Diagnosis not present

## 2017-10-29 DIAGNOSIS — N3281 Overactive bladder: Secondary | ICD-10-CM | POA: Diagnosis not present

## 2017-10-29 DIAGNOSIS — R351 Nocturia: Secondary | ICD-10-CM | POA: Diagnosis not present

## 2017-10-29 NOTE — Progress Notes (Signed)
Office Visit Note   Patient: Kelly [name]           Date of Birth: [birth year]           MRN: [id number] Visit Date: 10/29/2017              Requested by: Elby Showers, MD [address] [place], [place] [id number] PCP: Elby Showers, MD   Assessment & Plan: Visit Diagnoses:  1. Acute pain of right knee   2. Status post right partial knee replacement     Plan: I gave her reassurance that thus far the implant looks fine.  There is arthritic changes in her knee and I did offer steroid injection but she declined this.  She does feel that she is getting better overall and this was a visit to have reassurance that she can proceed with her trip to [place].  All question concerns were answered and addressed.  Follow-up will be as needed.  Follow-Up Instructions: Return if symptoms worsen or fail to improve.   Orders:  Orders Placed This Encounter  Procedures  . XR KNEE 3 VIEW RIGHT   No orders of the defined types were placed in this encounter.     Procedures: No procedures performed   Clinical Data: No additional findings.   Subjective: Chief Complaint  Patient presents with  . Right Knee - Pain  The patient is well-known to me.  She is 3 years out from a partial knee arthroplasty of the right knee medial compartment.  She says she is done very well over a long period time but then fell 2 weeks ago over a stump when she tripped.  She had some pain in her knee since then has been using knee brace.  She is also a little bit of popping.  The knee has been coming down with CBD oil.  She was concerned that there was something going on with the partial knee replacement she she want to get this checked out.  She is leaving for trip to [place] soon and wants to be fine traveling.  She describes the pain is only being mild though.  HPI  Review of Systems She currently denies any headache, chest pain, shortness of breath, fever, chills, nausea,  vomiting.  Objective: Vital Signs: There were no vitals taken for this visit.  Physical Exam She is alert and oriented x3 and in no acute distress Ortho Exam Examination of her right knee shows a well-healed anterior-medial incision from her previous partial knee arthroplasty.  It is well-healed.  There is no knee joint effusion.  She has full flexion to about 100 degrees extension.  This is similar to what she had postoperative.  She is moderately obese.  The knee feels ligamentously stable.  There is slight patellofemoral crepitation.  She can hold her knee fully extended. Specialty Comments:  No specialty comments available.  Imaging: Xr Knee 3 View Right  Result Date: 10/29/2017 3 views of the right knee show a partial knee arthroplasty with no evidence of loosening or hardware failure in light of an acute fall.  There is evidence of arthritis in general knee.    PMFS History: Patient Active Problem List   Diagnosis Date Noted  . Constipation 10/31/2016  . Impaired glucose tolerance 10/31/2016  . Stage III chronic kidney disease ([place]) 10/31/2016  . Urge urinary incontinence 10/31/2016  . Status post right partial knee replacement 10/02/2014  . Anxiety and depression 11/06/2011  . Hypertension 10/13/2010  .  Hyperlipidemia 10/13/2010  . Osteoarthritis 10/13/2010  . UNSPECIFIED IRON DEFICIENCY ANEMIA 04/11/2010  . DIARRHEA 04/11/2010   Past Medical History:  Diagnosis Date  . Depression   . Hyperlipidemia   . Hypertension     Family History  Problem Relation Age of Onset  . Heart disease Mother   . Alcohol abuse Father   . Hypertension Father   . Cancer Sister   . Hypertension Sister   . Breast cancer Neg Hx     Past Surgical History:  Procedure Laterality Date  . ABDOMINAL HYSTERECTOMY    . BREAST REDUCTION SURGERY     and lift  . BREAST REDUCTION SURGERY Bilateral   . CARPAL TUNNEL RELEASE  05/11   right  . CHOLECYSTECTOMY    . FACIAL COSMETIC SURGERY   2/10  . KNEE ARTHROSCOPY  2007   left & right  . LIPOSUCTION EXTREMITIES     thighs  . PARTIAL KNEE ARTHROPLASTY Right 10/02/2014   Procedure: RIGHT KNEE MEDIAL UNICOMPARTMENTAL ARTHROPLASTY;  Surgeon: Mcarthur Rossetti, MD;  Location: [facility];  Service: Orthopedics;  Laterality: Right;  . REDUCTION MAMMAPLASTY     bilateral  . TOTAL KNEE REVISION  10/12/2011   Procedure: TOTAL KNEE REVISION;  Surgeon: Mcarthur Rossetti, MD;  Location: [facility];  Service: Orthopedics;  Laterality: Left;  Left Total Knee Revision Arthroplasty  . TRIGGER FINGER RELEASE  5/11   right 4th finger   Social History   Occupational History  . Not on file  Tobacco Use  . Smoking status: Never Smoker  . Smokeless tobacco: Never Used  Substance and Sexual Activity  . Alcohol use: Yes    Alcohol/week: 14.0 standard drinks    Types: 14 Glasses of wine per week    Comment: daily 1- 2  . Drug use: No  . Sexual activity: Not on file

## 2017-11-09 ENCOUNTER — Other Ambulatory Visit: Payer: Self-pay | Admitting: Internal Medicine

## 2017-11-09 DIAGNOSIS — N183 Chronic kidney disease, stage 3 unspecified: Secondary | ICD-10-CM

## 2017-11-09 DIAGNOSIS — Z0000 Encounter for general adult medical examination without abnormal findings: Secondary | ICD-10-CM

## 2017-11-09 DIAGNOSIS — E782 Mixed hyperlipidemia: Secondary | ICD-10-CM

## 2017-11-09 DIAGNOSIS — I10 Essential (primary) hypertension: Secondary | ICD-10-CM

## 2017-11-09 DIAGNOSIS — M1990 Unspecified osteoarthritis, unspecified site: Secondary | ICD-10-CM

## 2017-11-09 DIAGNOSIS — N3941 Urge incontinence: Secondary | ICD-10-CM

## 2017-11-09 DIAGNOSIS — R7302 Impaired glucose tolerance (oral): Secondary | ICD-10-CM

## 2017-11-13 ENCOUNTER — Other Ambulatory Visit: Payer: Medicare Other | Admitting: Internal Medicine

## 2017-11-13 DIAGNOSIS — R7302 Impaired glucose tolerance (oral): Secondary | ICD-10-CM | POA: Diagnosis not present

## 2017-11-13 DIAGNOSIS — M1990 Unspecified osteoarthritis, unspecified site: Secondary | ICD-10-CM

## 2017-11-13 DIAGNOSIS — Z0000 Encounter for general adult medical examination without abnormal findings: Secondary | ICD-10-CM

## 2017-11-13 DIAGNOSIS — N3941 Urge incontinence: Secondary | ICD-10-CM

## 2017-11-13 DIAGNOSIS — N183 Chronic kidney disease, stage 3 unspecified: Secondary | ICD-10-CM

## 2017-11-13 DIAGNOSIS — E782 Mixed hyperlipidemia: Secondary | ICD-10-CM

## 2017-11-13 DIAGNOSIS — I10 Essential (primary) hypertension: Secondary | ICD-10-CM

## 2017-11-13 MED ORDER — DICLOFENAC SODIUM 1 % TD GEL: 2.0000 g | Freq: Four times a day (QID) | TRANSDERMAL | 2 refills | Status: DC

## 2017-11-13 NOTE — Addendum Note (Signed)
Addended by: Elby Showers on: 11/13/2017 09:48 AM   Modules accepted: Orders

## 2017-11-13 NOTE — Progress Notes (Signed)
Pt going to [place] and wants Rx for Voltaren gel for knee pain

## 2017-11-14 LAB — COMPLETE METABOLIC PANEL WITH GFR
AG RATIO: 1.5 (calc) (ref 1.0–2.5)
ALT: 14 U/L (ref 6–29)
AST: 19 U/L (ref 10–35)
Albumin: 4 g/dL (ref 3.6–5.1)
Alkaline phosphatase (APISO): 117 U/L (ref 33–130)
BILIRUBIN TOTAL: 0.6 mg/dL (ref 0.2–1.2)
BUN/Creatinine Ratio: 14 (calc) (ref 6–22)
BUN: 19 mg/dL (ref 7–25)
CALCIUM: 9.6 mg/dL (ref 8.6–10.4)
CO2: 26 mmol/L (ref 20–32)
Chloride: 104 mmol/L (ref 98–110)
Creat: 1.37 mg/dL — ABNORMAL HIGH (ref 0.60–0.93)
GFR, EST AFRICAN AMERICAN: 44 mL/min/{1.73_m2} — AB (ref >=60)
GFR, Est Non African American: 38 mL/min/{1.73_m2} — ABNORMAL LOW (ref >=60)
GLOBULIN: 2.7 g/dL (ref 1.9–3.7)
Glucose, Bld: 107 mg/dL — ABNORMAL HIGH (ref 65–99)
POTASSIUM: 4.1 mmol/L (ref 3.5–5.3)
SODIUM: 140 mmol/L (ref 135–146)
TOTAL PROTEIN: 6.7 g/dL (ref 6.1–8.1)

## 2017-11-14 LAB — TSH: TSH: 3.47 mIU/L (ref 0.40–4.50)

## 2017-11-14 LAB — LIPID PANEL
CHOL/HDL RATIO: 5.2 (calc) — AB (ref <5.0)
CHOLESTEROL: 170 mg/dL (ref <200)
HDL: 33 mg/dL — AB (ref >50)
LDL Cholesterol (Calc): 109 mg/dL (calc) — ABNORMAL HIGH
Non-HDL Cholesterol (Calc): 137 mg/dL (calc) — ABNORMAL HIGH (ref <130)
TRIGLYCERIDES: 158 mg/dL — AB (ref <150)

## 2017-11-14 LAB — CBC WITH DIFFERENTIAL/PLATELET
BASOS ABS: 31 {cells}/uL (ref 0–200)
Basophils Relative: 0.6 %
EOS PCT: 2.1 %
Eosinophils Absolute: 107 cells/uL (ref 15–500)
HEMATOCRIT: 39.8 % (ref 35.0–45.0)
HEMOGLOBIN: 13 g/dL (ref 11.7–15.5)
LYMPHS ABS: 1464 {cells}/uL (ref 850–3900)
MCH: 28.5 pg (ref 27.0–33.0)
MCHC: 32.7 g/dL (ref 32.0–36.0)
MCV: 87.3 fL (ref 80.0–100.0)
MPV: 11.4 fL (ref 7.5–12.5)
Monocytes Relative: 5.1 %
NEUTROS ABS: 3239 {cells}/uL (ref 1500–7800)
NEUTROS PCT: 63.5 %
Platelets: 212 10*3/uL (ref 140–400)
RBC: 4.56 10*6/uL (ref 3.80–5.10)
RDW: 13.8 % (ref 11.0–15.0)
Total Lymphocyte: 28.7 %
WBC: 5.1 10*3/uL (ref 3.8–10.8)
WBCMIX: 260 {cells}/uL (ref 200–950)

## 2017-11-14 LAB — MICROALBUMIN / CREATININE URINE RATIO
CREATININE, URINE: 129 mg/dL (ref 20–275)
MICROALB/CREAT RATIO: 27 ug/mg{creat} (ref <30)
Microalb, Ur: 3.5 mg/dL

## 2017-11-14 LAB — HEMOGLOBIN A1C
Hgb A1c MFr Bld: 5.7 % of total Hgb — ABNORMAL HIGH (ref <5.7)
Mean Plasma Glucose: 117 (calc)
eAG (mmol/L): 6.5 (calc)

## 2017-11-15 ENCOUNTER — Other Ambulatory Visit: Payer: Medicare Other | Admitting: Internal Medicine

## 2017-11-15 ENCOUNTER — Encounter: Payer: Self-pay | Admitting: Internal Medicine

## 2017-11-15 ENCOUNTER — Ambulatory Visit (INDEPENDENT_AMBULATORY_CARE_PROVIDER_SITE_OTHER): Payer: Medicare Other | Admitting: Internal Medicine

## 2017-11-15 VITALS — BP 100/70 | HR 70 | Temp 98.3°F | Ht 60.5 in | Wt 198.0 lb

## 2017-11-15 DIAGNOSIS — R8290 Unspecified abnormal findings in urine: Secondary | ICD-10-CM

## 2017-11-15 DIAGNOSIS — Z0000 Encounter for general adult medical examination without abnormal findings: Secondary | ICD-10-CM

## 2017-11-15 DIAGNOSIS — N3941 Urge incontinence: Secondary | ICD-10-CM | POA: Diagnosis not present

## 2017-11-15 DIAGNOSIS — R7302 Impaired glucose tolerance (oral): Secondary | ICD-10-CM

## 2017-11-15 DIAGNOSIS — M1990 Unspecified osteoarthritis, unspecified site: Secondary | ICD-10-CM | POA: Diagnosis not present

## 2017-11-15 DIAGNOSIS — I10 Essential (primary) hypertension: Secondary | ICD-10-CM

## 2017-11-15 DIAGNOSIS — E782 Mixed hyperlipidemia: Secondary | ICD-10-CM

## 2017-11-15 DIAGNOSIS — N183 Chronic kidney disease, stage 3 unspecified: Secondary | ICD-10-CM

## 2017-11-15 LAB — POCT URINALYSIS DIPSTICK
APPEARANCE: NEGATIVE
Bilirubin, UA: NEGATIVE
Glucose, UA: NEGATIVE
Ketones, UA: NEGATIVE
NITRITE UA: NEGATIVE
ODOR: NEGATIVE
PROTEIN UA: POSITIVE — AB
Spec Grav, UA: 1.01 (ref 1.010–1.025)
Urobilinogen, UA: 0.2 E.U./dL
pH, UA: 6.5 (ref 5.0–8.0)

## 2017-11-15 NOTE — Progress Notes (Signed)
Subjective:    Patient ID: Kelly [name], female    DOB: 1945-08-12, 72 y.o.   MRN: [id number]  HPI 72 year old Female for health maintenance exam and evaluation of medical issues.   Has nocturia  that did not respond to Myrbetriq.Will need to consider Urology referral.    Flu shot to be given after upcoming trip to [place].  She has a history of hyperlipidemia maintained on generic Crestor.  History of essential hypertension, impaired glucose tolerance without complications.  History of elevated serum creatinine.  Patient has had multiple plastic surgery procedures in 2002 in 2003 including abdominoplasty, thigh liposuction, breast reduction surgery and bilateral brachioplasty.  History of facelift with laser peel 2010.  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 Fredna Dow in 2011.  History of iron deficiency anemia which resolved after stopping anti-inflammatory medication.  Had colonoscopy in March 2012 which was normal.  History of right carpal tunnel syndrome.  Total left knee arthroplasty 2013.  Right knee arthroscopic surgery 2007.  Cholecystectomy 1965.  Hysterectomy without oophorectomy at age 39.  Right knee arthroplasty by Dr. Ninfa Linden 2016.  Social history: Patient was raised in an orphanage in [place].  Patient's daughter died of metastatic ovarian cancer.  Patient was left with many bills to pay for her daughter.  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  She is retired from the [employer].  Has been working as a Land.  She is divorced.  Drinks 2 glasses of wine daily.  Does not smoke.  Family history: Father died of alcoholism.  Mother died of heart disease.  One sister with history of lung cancer still living.  3 other sisters in good health.    Review of Systems  Constitutional: Negative.   Respiratory: Negative.   Cardiovascular: Negative.   Gastrointestinal:  Negative.   Genitourinary:       Urge urinary incontinence not responsive to Myrbetriq or Detrol  Neurological: Negative.   Psychiatric/Behavioral: Negative.        Objective:   Physical Exam  Constitutional: She is oriented to person, place, and time. She appears well-developed and well-nourished.  HENT:  Head: Normocephalic and atraumatic.  Right Ear: External ear normal.  Left Ear: External ear normal.  Mouth/Throat: Oropharynx is clear and moist. No oropharyngeal exudate.  Eyes: Pupils are equal, round, and reactive to light. EOM are normal. Right eye exhibits no discharge. Left eye exhibits no discharge.  Neck: Neck supple. No JVD present. No thyromegaly present.  Cardiovascular: Normal rate, regular rhythm, normal heart sounds and intact distal pulses.  No murmur heard. Pulmonary/Chest: No stridor. No respiratory distress. She has no wheezes. She has no rales.  Breasts normal female without masses  Abdominal: Soft. Bowel sounds are normal. She exhibits no distension and no mass. There is no tenderness. There is no rebound and no guarding.  Genitourinary:  Genitourinary Comments: Has had hysterectomy without oophorectomy.  Manual normal.  Pap deferred due to age.  Musculoskeletal: She exhibits no edema.  Lymphadenopathy:    She has no cervical adenopathy.  Neurological: She is alert and oriented to person, place, and time. She displays normal reflexes. No cranial nerve deficit. She exhibits normal muscle tone. Coordination normal.  Skin: Skin is warm and dry. No rash noted.  Psychiatric: She has a normal mood and affect. Her behavior is normal. Judgment and thought content normal.  Vitals reviewed.  Assessment & Plan:  Urge urinary incontinence-consider evaluation with urologist  Elevated serum creatinine-history of chronic kidney disease and has seen Dr. Justin Mend in the past for stage III CKD.  This is stable and likely result of hypertension and use of nonsteroidal  anti-inflammatory medications.  Creatinine is 1.37  Hyperlipidemia-stable on Crestor 10 mg daily  Essential hypertension-stable on amlodipine  Impaired glucose tolerance-hemoglobin A1c stable at 5.7%.  Continue diet and exercise.  History of anxiety but no longer taking Xanax  Abnormal dipstick urinalysis-culture revealed no growth  Plan: Return in 6 months or as needed.  Continue same medications.  To get flu vaccine when she returns from [place].  Reminded about annual eye exam.  Subjective:   Patient presents for Medicare Annual/Subsequent preventive examination.  Review Past Medical/Family/Social: See above   Risk Factors  Current exercise habits: Is exercise about her home and her yard Dietary issues discussed: Low-fat low carbohydrate  Cardiac risk factors: Hyperlipidemia, impaired glucose tolerance, family history of mother  Depression Screen  (Note: if answer to either of the following is "Yes", a more complete depression screening is indicated)   Over the past two weeks, have you felt down, depressed or hopeless? No  Over the past two weeks, have you felt little interest or pleasure in doing things? No Have you lost interest or pleasure in daily life? No Do you often feel hopeless? No Do you cry easily over simple problems? No   Activities of Daily Living  In your present state of health, do you have any difficulty performing the following activities?:   Driving? No  Managing money? No  Feeding yourself? No  Getting from bed to chair? No  Climbing a flight of stairs? No  Preparing food and eating?: No  Bathing or showering? No  Getting dressed: No  Getting to the toilet? No  Using the toilet:No  Moving around from place to place: No  In the past year have you fallen or had a near fall?:  Yes 1 without injury in yard-tripped over stump while watering flowers Are you sexually active? No  Do you have more than one partner? No   Hearing Difficulties: No  Do  you often ask people to speak up or repeat themselves? No  Do you experience ringing or noises in your ears? No  Do you have difficulty understanding soft or whispered voices? No  Do you feel that you have a problem with memory? No Do you often misplace items? No    Home Safety:  Do you have a smoke alarm at your residence? Yes Do you have grab bars in the bathroom?  Yes Do you have throw rugs in your house?  Yes   Cognitive Testing  Alert? Yes Normal Appearance?Yes  Oriented to person? Yes Place? Yes  Time? Yes  Recall of three objects? Yes  Can perform simple calculations? Yes  Displays appropriate judgment?Yes  Can read the correct time from a watch face?Yes   List the Names of Other Physician/Practitioners you currently use:  See referral list for the physicians patient is currently seeing.     Review of Systems: See above   Objective:     General appearance: Appears stated age and mildly obese  Head: Normocephalic, without obvious abnormality, atraumatic  Eyes: conj clear, EOMi PEERLA  Ears: normal TM's and external ear canals both ears  Nose: Nares normal. Septum midline. Mucosa normal. No drainage or sinus tenderness.  Throat: lips, mucosa, and tongue normal; teeth and gums  normal  Neck: no adenopathy, no carotid bruit, no JVD, supple, symmetrical, trachea midline and thyroid not enlarged, symmetric, no tenderness/mass/nodules  No CVA tenderness.  Lungs: clear to auscultation bilaterally  Breasts: normal appearance without masses Heart: regular rate and rhythm, S1, S2 normal, no murmur, click, rub or gallop  Abdomen: soft, non-tender; bowel sounds normal; no masses, no organomegaly  Musculoskeletal: ROM normal in all joints, no crepitus, no deformity, Normal muscle strengthen. Back  is symmetric, no curvature. Skin: Skin color, texture, turgor normal. No rashes or lesions  Lymph nodes: Cervical, supraclavicular, and axillary nodes normal.  Neurologic: CN 2 -12  Normal, Normal symmetric reflexes. Normal coordination and gait  Psych: Alert & Oriented x 3, Mood appear stable.    Assessment:    Annual wellness medicare exam   Plan:    During the course of the visit the patient was educated and counseled about appropriate screening and preventive services including:   Annual mammogram  Annual flu vaccine     Patient Instructions (the written plan) was given to the patient.  Medicare Attestation  I have personally reviewed:  The patient's medical and social history  Their use of alcohol, tobacco or illicit drugs  Their current medications and supplements  The patient's functional ability including ADLs,fall risks, home safety risks, cognitive, and hearing and visual impairment  Diet and physical activities  Evidence for depression or mood disorders  The patient's weight, height, BMI, and visual acuity have been recorded in the chart. I have made referrals, counseling, and provided education to the patient based on review of the above and I have provided the patient with a written personalized care plan for preventive services.

## 2017-11-16 LAB — URINE CULTURE
MICRO NUMBER:: 91189740
RESULT: NO GROWTH
SPECIMEN QUALITY:: ADEQUATE

## 2017-11-19 ENCOUNTER — Encounter: Payer: Medicare Other | Admitting: Internal Medicine

## 2017-12-02 NOTE — Patient Instructions (Signed)
It was a pleasure to see you today.  Please return after her trip from [place] to have flu vaccine.  Continue same medications and follow-up in 6 months

## 2017-12-10 ENCOUNTER — Ambulatory Visit (INDEPENDENT_AMBULATORY_CARE_PROVIDER_SITE_OTHER): Payer: Medicare Other | Admitting: Internal Medicine

## 2017-12-10 DIAGNOSIS — Z23 Encounter for immunization: Secondary | ICD-10-CM | POA: Diagnosis not present

## 2017-12-10 NOTE — Progress Notes (Signed)
Flu vaccine by CMA 

## 2017-12-10 NOTE — Patient Instructions (Signed)
Flu vaccine given by CMA 

## 2017-12-12 ENCOUNTER — Telehealth: Payer: Self-pay | Admitting: Internal Medicine

## 2017-12-12 NOTE — Telephone Encounter (Signed)
Patient dropped off information about a shot given every 6 months for bladder control.  The current medication that she takes by mouth makes her constipated and she has very bad dry mouth.  She would like to consider doing this injection.  However, she doesn't know the name of the medication, she only dropped the name of the physician off that is providing the shot to a friend of a friend.    Dr. Tennis Must [address] [place], [place] She wanted Dr. Renold Genta to contact this office and ask what the shot/name of the injection was and potentially begin giving this to her.    Advised patient that she needs to contact her friend, Laticha and ask her to go with the patient to the next appointment and ask the provider what the name of the shot is and then call us back and tell us what the name is and we can go from there.  Dr. Renold Genta will not hunt the information down for her.  The shot is given every 6 months.    She will call back.

## 2017-12-12 NOTE — Telephone Encounter (Signed)
Up to Date info reviewed. This may be botulinum toxin injections. She needs to investigate further.

## 2017-12-13 NOTE — Telephone Encounter (Signed)
Patient called back this morning.  She found out that it is Botox.  She had an appointment with the Urologist for today, but she cancelled it because she didn't want to be put on yet more oral medications.  I told her that we didn't do Botox here in the office.  Suggested that she call the Urologist office back and see if they did Botox injections and if so, reschedule that appointment.  She called back and said they do Botox and she made an appointment.  She is very excited that she can get these shots every 6 months for bladder control.

## 2017-12-19 DIAGNOSIS — N3281 Overactive bladder: Secondary | ICD-10-CM | POA: Diagnosis not present

## 2017-12-19 DIAGNOSIS — R351 Nocturia: Secondary | ICD-10-CM | POA: Diagnosis not present

## 2018-01-01 ENCOUNTER — Other Ambulatory Visit: Payer: Self-pay

## 2018-01-02 DIAGNOSIS — M62838 Other muscle spasm: Secondary | ICD-10-CM | POA: Diagnosis not present

## 2018-01-02 DIAGNOSIS — N3281 Overactive bladder: Secondary | ICD-10-CM | POA: Diagnosis not present

## 2018-01-02 DIAGNOSIS — M6281 Muscle weakness (generalized): Secondary | ICD-10-CM | POA: Diagnosis not present

## 2018-01-02 DIAGNOSIS — R151 Fecal smearing: Secondary | ICD-10-CM | POA: Diagnosis not present

## 2018-01-18 DIAGNOSIS — N3281 Overactive bladder: Secondary | ICD-10-CM | POA: Diagnosis not present

## 2018-01-18 DIAGNOSIS — R351 Nocturia: Secondary | ICD-10-CM | POA: Diagnosis not present

## 2018-01-18 DIAGNOSIS — M6281 Muscle weakness (generalized): Secondary | ICD-10-CM | POA: Diagnosis not present

## 2018-01-18 DIAGNOSIS — M62838 Other muscle spasm: Secondary | ICD-10-CM | POA: Diagnosis not present

## 2018-01-18 DIAGNOSIS — R151 Fecal smearing: Secondary | ICD-10-CM | POA: Diagnosis not present

## 2018-01-23 DIAGNOSIS — M9903 Segmental and somatic dysfunction of lumbar region: Secondary | ICD-10-CM | POA: Diagnosis not present

## 2018-01-23 DIAGNOSIS — M5032 Other cervical disc degeneration, mid-cervical region, unspecified level: Secondary | ICD-10-CM | POA: Diagnosis not present

## 2018-01-23 DIAGNOSIS — M5386 Other specified dorsopathies, lumbar region: Secondary | ICD-10-CM | POA: Diagnosis not present

## 2018-01-23 DIAGNOSIS — M9902 Segmental and somatic dysfunction of thoracic region: Secondary | ICD-10-CM | POA: Diagnosis not present

## 2018-01-23 DIAGNOSIS — M5414 Radiculopathy, thoracic region: Secondary | ICD-10-CM | POA: Diagnosis not present

## 2018-01-23 DIAGNOSIS — M9901 Segmental and somatic dysfunction of cervical region: Secondary | ICD-10-CM | POA: Diagnosis not present

## 2018-02-17 ENCOUNTER — Other Ambulatory Visit: Payer: Self-pay | Admitting: Internal Medicine

## 2018-02-19 DIAGNOSIS — R151 Fecal smearing: Secondary | ICD-10-CM | POA: Diagnosis not present

## 2018-02-19 DIAGNOSIS — N3281 Overactive bladder: Secondary | ICD-10-CM | POA: Diagnosis not present

## 2018-02-19 DIAGNOSIS — M6281 Muscle weakness (generalized): Secondary | ICD-10-CM | POA: Diagnosis not present

## 2018-02-19 DIAGNOSIS — M62838 Other muscle spasm: Secondary | ICD-10-CM | POA: Diagnosis not present

## 2018-03-05 DIAGNOSIS — M5032 Other cervical disc degeneration, mid-cervical region, unspecified level: Secondary | ICD-10-CM | POA: Diagnosis not present

## 2018-03-05 DIAGNOSIS — M9903 Segmental and somatic dysfunction of lumbar region: Secondary | ICD-10-CM | POA: Diagnosis not present

## 2018-03-05 DIAGNOSIS — M9902 Segmental and somatic dysfunction of thoracic region: Secondary | ICD-10-CM | POA: Diagnosis not present

## 2018-03-05 DIAGNOSIS — M5386 Other specified dorsopathies, lumbar region: Secondary | ICD-10-CM | POA: Diagnosis not present

## 2018-03-05 DIAGNOSIS — M5414 Radiculopathy, thoracic region: Secondary | ICD-10-CM | POA: Diagnosis not present

## 2018-03-05 DIAGNOSIS — M9901 Segmental and somatic dysfunction of cervical region: Secondary | ICD-10-CM | POA: Diagnosis not present

## 2018-03-25 DIAGNOSIS — N3281 Overactive bladder: Secondary | ICD-10-CM | POA: Diagnosis not present

## 2018-03-25 DIAGNOSIS — R351 Nocturia: Secondary | ICD-10-CM | POA: Diagnosis not present

## 2018-04-25 DIAGNOSIS — H05019 Cellulitis of unspecified orbit: Secondary | ICD-10-CM | POA: Diagnosis not present

## 2018-05-13 DIAGNOSIS — R151 Fecal smearing: Secondary | ICD-10-CM | POA: Diagnosis not present

## 2018-05-13 DIAGNOSIS — H00021 Hordeolum internum right upper eyelid: Secondary | ICD-10-CM | POA: Diagnosis not present

## 2018-05-13 DIAGNOSIS — M62838 Other muscle spasm: Secondary | ICD-10-CM | POA: Diagnosis not present

## 2018-05-13 DIAGNOSIS — M6281 Muscle weakness (generalized): Secondary | ICD-10-CM | POA: Diagnosis not present

## 2018-05-13 DIAGNOSIS — L905 Scar conditions and fibrosis of skin: Secondary | ICD-10-CM | POA: Diagnosis not present

## 2018-05-13 DIAGNOSIS — N3281 Overactive bladder: Secondary | ICD-10-CM | POA: Diagnosis not present

## 2018-07-04 DIAGNOSIS — M9903 Segmental and somatic dysfunction of lumbar region: Secondary | ICD-10-CM | POA: Diagnosis not present

## 2018-07-04 DIAGNOSIS — M9901 Segmental and somatic dysfunction of cervical region: Secondary | ICD-10-CM | POA: Diagnosis not present

## 2018-07-04 DIAGNOSIS — M5032 Other cervical disc degeneration, mid-cervical region, unspecified level: Secondary | ICD-10-CM | POA: Diagnosis not present

## 2018-07-04 DIAGNOSIS — M5386 Other specified dorsopathies, lumbar region: Secondary | ICD-10-CM | POA: Diagnosis not present

## 2018-07-04 DIAGNOSIS — M9902 Segmental and somatic dysfunction of thoracic region: Secondary | ICD-10-CM | POA: Diagnosis not present

## 2018-07-04 DIAGNOSIS — M5414 Radiculopathy, thoracic region: Secondary | ICD-10-CM | POA: Diagnosis not present

## 2018-07-09 DIAGNOSIS — M9901 Segmental and somatic dysfunction of cervical region: Secondary | ICD-10-CM | POA: Diagnosis not present

## 2018-07-09 DIAGNOSIS — M9902 Segmental and somatic dysfunction of thoracic region: Secondary | ICD-10-CM | POA: Diagnosis not present

## 2018-07-09 DIAGNOSIS — M5414 Radiculopathy, thoracic region: Secondary | ICD-10-CM | POA: Diagnosis not present

## 2018-07-09 DIAGNOSIS — M5032 Other cervical disc degeneration, mid-cervical region, unspecified level: Secondary | ICD-10-CM | POA: Diagnosis not present

## 2018-07-09 DIAGNOSIS — M5386 Other specified dorsopathies, lumbar region: Secondary | ICD-10-CM | POA: Diagnosis not present

## 2018-07-09 DIAGNOSIS — M9903 Segmental and somatic dysfunction of lumbar region: Secondary | ICD-10-CM | POA: Diagnosis not present

## 2018-07-18 DIAGNOSIS — M9901 Segmental and somatic dysfunction of cervical region: Secondary | ICD-10-CM | POA: Diagnosis not present

## 2018-07-18 DIAGNOSIS — M5386 Other specified dorsopathies, lumbar region: Secondary | ICD-10-CM | POA: Diagnosis not present

## 2018-07-18 DIAGNOSIS — M5414 Radiculopathy, thoracic region: Secondary | ICD-10-CM | POA: Diagnosis not present

## 2018-07-18 DIAGNOSIS — M9903 Segmental and somatic dysfunction of lumbar region: Secondary | ICD-10-CM | POA: Diagnosis not present

## 2018-07-18 DIAGNOSIS — M9902 Segmental and somatic dysfunction of thoracic region: Secondary | ICD-10-CM | POA: Diagnosis not present

## 2018-07-18 DIAGNOSIS — M5032 Other cervical disc degeneration, mid-cervical region, unspecified level: Secondary | ICD-10-CM | POA: Diagnosis not present

## 2018-07-19 ENCOUNTER — Other Ambulatory Visit: Payer: Self-pay | Admitting: Internal Medicine

## 2018-07-19 DIAGNOSIS — R921 Mammographic calcification found on diagnostic imaging of breast: Secondary | ICD-10-CM

## 2018-07-25 DIAGNOSIS — M9902 Segmental and somatic dysfunction of thoracic region: Secondary | ICD-10-CM | POA: Diagnosis not present

## 2018-07-25 DIAGNOSIS — M9901 Segmental and somatic dysfunction of cervical region: Secondary | ICD-10-CM | POA: Diagnosis not present

## 2018-07-25 DIAGNOSIS — M5386 Other specified dorsopathies, lumbar region: Secondary | ICD-10-CM | POA: Diagnosis not present

## 2018-07-25 DIAGNOSIS — M9903 Segmental and somatic dysfunction of lumbar region: Secondary | ICD-10-CM | POA: Diagnosis not present

## 2018-07-25 DIAGNOSIS — M5414 Radiculopathy, thoracic region: Secondary | ICD-10-CM | POA: Diagnosis not present

## 2018-07-25 DIAGNOSIS — M5032 Other cervical disc degeneration, mid-cervical region, unspecified level: Secondary | ICD-10-CM | POA: Diagnosis not present

## 2018-08-05 DIAGNOSIS — M5032 Other cervical disc degeneration, mid-cervical region, unspecified level: Secondary | ICD-10-CM | POA: Diagnosis not present

## 2018-08-05 DIAGNOSIS — M9903 Segmental and somatic dysfunction of lumbar region: Secondary | ICD-10-CM | POA: Diagnosis not present

## 2018-08-05 DIAGNOSIS — M5386 Other specified dorsopathies, lumbar region: Secondary | ICD-10-CM | POA: Diagnosis not present

## 2018-08-05 DIAGNOSIS — M9901 Segmental and somatic dysfunction of cervical region: Secondary | ICD-10-CM | POA: Diagnosis not present

## 2018-08-05 DIAGNOSIS — M5414 Radiculopathy, thoracic region: Secondary | ICD-10-CM | POA: Diagnosis not present

## 2018-08-05 DIAGNOSIS — M9902 Segmental and somatic dysfunction of thoracic region: Secondary | ICD-10-CM | POA: Diagnosis not present

## 2018-08-19 ENCOUNTER — Other Ambulatory Visit: Payer: Self-pay

## 2018-08-19 ENCOUNTER — Ambulatory Visit
Admission: RE | Admit: 2018-08-19 | Discharge: 2018-08-19 | Disposition: A | Discharge status: Home / Self Care | Payer: Medicare Other | Source: Ambulatory Visit | Attending: Internal Medicine | Admitting: Internal Medicine

## 2018-08-19 DIAGNOSIS — R921 Mammographic calcification found on diagnostic imaging of breast: Secondary | ICD-10-CM | POA: Diagnosis not present

## 2018-08-19 MED ORDER — ROSUVASTATIN CALCIUM 10 MG PO TABS: 10.0000 mg | ORAL_TABLET | Freq: Every day | ORAL | 3 refills | Status: DC

## 2018-09-23 DIAGNOSIS — M5414 Radiculopathy, thoracic region: Secondary | ICD-10-CM | POA: Diagnosis not present

## 2018-09-23 DIAGNOSIS — M5032 Other cervical disc degeneration, mid-cervical region, unspecified level: Secondary | ICD-10-CM | POA: Diagnosis not present

## 2018-09-23 DIAGNOSIS — M9902 Segmental and somatic dysfunction of thoracic region: Secondary | ICD-10-CM | POA: Diagnosis not present

## 2018-09-23 DIAGNOSIS — M9903 Segmental and somatic dysfunction of lumbar region: Secondary | ICD-10-CM | POA: Diagnosis not present

## 2018-09-23 DIAGNOSIS — M9901 Segmental and somatic dysfunction of cervical region: Secondary | ICD-10-CM | POA: Diagnosis not present

## 2018-09-23 DIAGNOSIS — M5386 Other specified dorsopathies, lumbar region: Secondary | ICD-10-CM | POA: Diagnosis not present

## 2018-11-11 ENCOUNTER — Other Ambulatory Visit: Payer: Medicare Other | Admitting: Internal Medicine

## 2018-11-12 ENCOUNTER — Other Ambulatory Visit: Payer: Self-pay

## 2018-11-12 ENCOUNTER — Other Ambulatory Visit: Payer: Medicare Other | Admitting: Internal Medicine

## 2018-11-12 DIAGNOSIS — Z0000 Encounter for general adult medical examination without abnormal findings: Secondary | ICD-10-CM

## 2018-11-12 DIAGNOSIS — E782 Mixed hyperlipidemia: Secondary | ICD-10-CM

## 2018-11-12 DIAGNOSIS — M1990 Unspecified osteoarthritis, unspecified site: Secondary | ICD-10-CM

## 2018-11-12 DIAGNOSIS — I10 Essential (primary) hypertension: Secondary | ICD-10-CM

## 2018-11-12 DIAGNOSIS — R7302 Impaired glucose tolerance (oral): Secondary | ICD-10-CM

## 2018-11-13 LAB — LIPID PANEL
Cholesterol: 179 mg/dL (ref <200)
HDL: 32 mg/dL — ABNORMAL LOW (ref >=50)
LDL Cholesterol (Calc): 119 mg/dL (calc) — ABNORMAL HIGH
Non-HDL Cholesterol (Calc): 147 mg/dL (calc) — ABNORMAL HIGH (ref <130)
Total CHOL/HDL Ratio: 5.6 (calc) — ABNORMAL HIGH (ref <5.0)
Triglycerides: 160 mg/dL — ABNORMAL HIGH (ref <150)

## 2018-11-13 LAB — COMPLETE METABOLIC PANEL WITH GFR
AG Ratio: 1.5 (calc) (ref 1.0–2.5)
ALT: 10 U/L (ref 6–29)
AST: 16 U/L (ref 10–35)
Albumin: 4 g/dL (ref 3.6–5.1)
Alkaline phosphatase (APISO): 103 U/L (ref 37–153)
BUN/Creatinine Ratio: 20 (calc) (ref 6–22)
BUN: 25 mg/dL (ref 7–25)
CO2: 29 mmol/L (ref 20–32)
Calcium: 10.2 mg/dL (ref 8.6–10.4)
Chloride: 105 mmol/L (ref 98–110)
Creat: 1.24 mg/dL — ABNORMAL HIGH (ref 0.60–0.93)
GFR, Est African American: 50 mL/min/{1.73_m2} — ABNORMAL LOW (ref >=60)
GFR, Est Non African American: 43 mL/min/{1.73_m2} — ABNORMAL LOW (ref >=60)
Globulin: 2.7 g/dL (calc) (ref 1.9–3.7)
Glucose, Bld: 85 mg/dL (ref 65–99)
Potassium: 4.2 mmol/L (ref 3.5–5.3)
Sodium: 142 mmol/L (ref 135–146)
Total Bilirubin: 0.7 mg/dL (ref 0.2–1.2)
Total Protein: 6.7 g/dL (ref 6.1–8.1)

## 2018-11-13 LAB — CBC WITH DIFFERENTIAL/PLATELET
Absolute Monocytes: 323 cells/uL (ref 200–950)
Basophils Absolute: 10 cells/uL (ref 0–200)
Basophils Relative: 0.2 %
Eosinophils Absolute: 69 cells/uL (ref 15–500)
Eosinophils Relative: 1.4 %
HCT: 39.1 % (ref 35.0–45.0)
Hemoglobin: 12.8 g/dL (ref 11.7–15.5)
Lymphs Abs: 1544 cells/uL (ref 850–3900)
MCH: 28.5 pg (ref 27.0–33.0)
MCHC: 32.7 g/dL (ref 32.0–36.0)
MCV: 87.1 fL (ref 80.0–100.0)
MPV: 11.8 fL (ref 7.5–12.5)
Monocytes Relative: 6.6 %
Neutro Abs: 2955 cells/uL (ref 1500–7800)
Neutrophils Relative %: 60.3 %
Platelets: 217 10*3/uL (ref 140–400)
RBC: 4.49 10*6/uL (ref 3.80–5.10)
RDW: 13.7 % (ref 11.0–15.0)
Total Lymphocyte: 31.5 %
WBC: 4.9 10*3/uL (ref 3.8–10.8)

## 2018-11-13 LAB — HEMOGLOBIN A1C
Hgb A1c MFr Bld: 5.6 % of total Hgb (ref <5.7)
Mean Plasma Glucose: 114 (calc)
eAG (mmol/L): 6.3 (calc)

## 2018-11-13 LAB — MICROALBUMIN / CREATININE URINE RATIO
Creatinine, Urine: 127 mg/dL (ref 20–275)
Microalb Creat Ratio: 20 mcg/mg creat (ref <30)
Microalb, Ur: 2.5 mg/dL

## 2018-11-13 LAB — TSH: TSH: 4.25 mIU/L (ref 0.40–4.50)

## 2018-11-14 ENCOUNTER — Other Ambulatory Visit: Payer: Self-pay | Admitting: Internal Medicine

## 2018-11-18 ENCOUNTER — Other Ambulatory Visit: Payer: Self-pay

## 2018-11-18 ENCOUNTER — Ambulatory Visit (INDEPENDENT_AMBULATORY_CARE_PROVIDER_SITE_OTHER): Payer: Medicare Other | Admitting: Internal Medicine

## 2018-11-18 ENCOUNTER — Encounter: Payer: Self-pay | Admitting: Internal Medicine

## 2018-11-18 VITALS — BP 132/80 | HR 70 | Temp 98.9°F | Ht 61.5 in | Wt 193.0 lb

## 2018-11-18 DIAGNOSIS — R7302 Impaired glucose tolerance (oral): Secondary | ICD-10-CM | POA: Diagnosis not present

## 2018-11-18 DIAGNOSIS — J309 Allergic rhinitis, unspecified: Secondary | ICD-10-CM

## 2018-11-18 DIAGNOSIS — E782 Mixed hyperlipidemia: Secondary | ICD-10-CM

## 2018-11-18 DIAGNOSIS — Z0000 Encounter for general adult medical examination without abnormal findings: Secondary | ICD-10-CM | POA: Diagnosis not present

## 2018-11-18 DIAGNOSIS — N3941 Urge incontinence: Secondary | ICD-10-CM | POA: Diagnosis not present

## 2018-11-18 DIAGNOSIS — N1831 Chronic kidney disease, stage 3a: Secondary | ICD-10-CM | POA: Diagnosis not present

## 2018-11-18 DIAGNOSIS — Z6835 Body mass index (BMI) 35.0-35.9, adult: Secondary | ICD-10-CM | POA: Diagnosis not present

## 2018-11-18 DIAGNOSIS — I10 Essential (primary) hypertension: Secondary | ICD-10-CM

## 2018-11-18 MED ORDER — METHYLPREDNISOLONE ACETATE 80 MG/ML IJ SUSP
80.0000 mg | Freq: Once | INTRAMUSCULAR | Status: AC
  Administered 2018-11-18: 80 mg via INTRAMUSCULAR

## 2018-11-18 NOTE — Progress Notes (Signed)
Subjective:    Patient ID: Kelly [name], female    DOB: 06-20-1945, 73 y.o.   MRN: [id number]  HPI 73 year old for health maintenance exam, Medicare wellness, and evaluation of medical issues.  She has a history of hyperlipidemia maintained on generic Crestor.  History of essential hypertension and impaired glucose tolerance.  History of elevated serum creatinine likely has chronic kidney disease due to years of hypertension.  Patient has had multiple plastic surgery procedures in 2002 and 2003 including abdominoplasty, followed by suction, breast reduction surgery of bilateral brachioplasty.  History of facial cosmetic surgery  with laser peel 2010.  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 Fredna Dow in 2011.  History of iron deficiency anemia which resolved after stopping anti-inflammatory medication.  Colonoscopy 2012 which was normal.  History of right carpal tunnel syndrome.  Total left knee arthroplasty 2013.  Right knee arthroscopic surgery 2007.  Cholecystectomy 1965.  Hysterectomy without oophorectomy at age 54.  Right knee arthroplasty by Dr. Ninfa Linden in 2016.  Social history: Patient was raised in an orphanage in [place].  Patient's daughter died of metastatic ovarian cancer.  Patient formally worked for the [employer] and has had several jobs since that time.  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  She is divorced.  Drinks 2 glasses of wine daily.  Does not smoke.  Family history: Father died of alcoholism.  Mother died of heart disease.  1 sister with history of lung cancer still living.  3 other sisters in good health.     Review of Systems  Constitutional: Negative.   Respiratory: Negative.   Cardiovascular: Negative.   Gastrointestinal: Negative.   Neurological: Negative.   Psychiatric/Behavioral: Negative.    history of urge urinary incontinence that did not respond to Myrbetriq or  Detrol     Objective:   Physical Exam Blood pressure 132/80 pulse 70 temperature 98.9 pulse oximetry 96% weight 193 pounds.  BMI 35.88  Skin warm and dry.  Nodes none.  TMs and pharynx are clear.  Neck is supple without JVD thyromegaly or carotid bruits.  Chest clear to auscultation.  Cardiac exam regular rate and rhythm normal S1 and S2 without murmurs or gallops.  Breast without masses.  Abdomen soft nondistended without hepatosplenomegaly masses or tenderness.  Bimanual exam normal.  She is status post hysterectomy without oophorectomy.  No lower extremity edema.  No lymphadenopathy.  No focal deficits on brief neurological exam.  Normal mood and affect.  Behavior is normal.  Judgment and thought content normal.       Assessment & Plan:  Essential hypertension-stable on current regimen  Chronic kidney disease stage III likely result of hypertension and use of nonsteroidal anti-inflammatory medications.  Creatinine is stable  Hyperlipidemia stable on statin medication  Essential hypertension treated with amlodipine  Impaired glucose tolerance with stable hemoglobin A1c which is normal this year at 5.6% and was 5.7% in October 2019  History of anxiety but no longer taking Xanax  BMI 35.88-has lost 5 pounds since October 2019  Urge urinary incontinence seen by [facility] February 2020 and pelvic PT recommended  Plan: Continue current medications and follow-up in 6 months.  Encourage diet exercise and weight loss.  Pleased with 5 pound weight loss.  Subjective:   Patient presents for Medicare Annual/Subsequent preventive examination.  Review Past Medical/Family/Social: See above  Risk Factors  Current exercise habits: Physically active Dietary issues  discussed: Low-fat low carbohydrate  Cardiac risk factors: Hyperlipidemia, family history mother  Depression Screen  (Note: if answer to either of the following is "Yes", a more complete depression screening is indicated)    Over the past two weeks, have you felt down, depressed or hopeless? No  Over the past two weeks, have you felt little interest or pleasure in doing things? No Have you lost interest or pleasure in daily life? No Do you often feel hopeless? No Do you cry easily over simple problems? No   Activities of Daily Living  In your present state of health, do you have any difficulty performing the following activities?:   Driving? No  Managing money? No  Feeding yourself? No  Getting from bed to chair? No  Climbing a flight of stairs? No  Preparing food and eating?: No  Bathing or showering? No  Getting dressed: No  Getting to the toilet? No  Using the toilet:No  Moving around from place to place: No  In the past year have you fallen or had a near fall?:No  Are you sexually active? No  Do you have more than one partner? No   Hearing Difficulties: No  Do you often ask people to speak up or repeat themselves? No  Do you experience ringing or noises in your ears? No  Do you have difficulty understanding soft or whispered voices? No  Do you feel that you have a problem with memory? No Do you often misplace items? No    Home Safety:  Do you have a smoke alarm at your residence? Yes Do you have grab bars in the bathroom?  Yes Do you have throw rugs in your house?  Yes   Cognitive Testing  Alert? Yes Normal Appearance?Yes  Oriented to person? Yes Place? Yes  Time? Yes  Recall of three objects? Yes  Can perform simple calculations? Yes  Displays appropriate judgment?Yes  Can read the correct time from a watch face?Yes   List the Names of Other Physician/Practitioners you currently use:  See referral list for the physicians patient is currently seeing.     Review of Systems: See above  Objective:     General appearance: Appears stated age and mildly obese  Head: Normocephalic, without obvious abnormality, atraumatic  Eyes: conj clear, EOMi PEERLA  Ears: normal TM's and  external ear canals both ears  Nose: Nares normal. Septum midline. Mucosa normal. No drainage or sinus tenderness.  Throat: lips, mucosa, and tongue normal; teeth and gums normal  Neck: no adenopathy, no carotid bruit, no JVD, supple, symmetrical, trachea midline and thyroid not enlarged, symmetric, no tenderness/mass/nodules  No CVA tenderness.  Lungs: clear to auscultation bilaterally  Breasts: normal appearance, no masses or tenderness Heart: regular rate and rhythm, S1, S2 normal, no murmur, click, rub or gallop  Abdomen: soft, non-tender; bowel sounds normal; no masses, no organomegaly  Musculoskeletal: ROM normal in all joints, no crepitus, no deformity, Normal muscle strengthen. Back  is symmetric, no curvature. Skin: Skin color, texture, turgor normal. No rashes or lesions  Lymph nodes: Cervical, supraclavicular, and axillary nodes normal.  Neurologic: CN 2 -12 Normal, Normal symmetric reflexes. Normal coordination and gait  Psych: Alert & Oriented x 3, Mood appear stable.    Assessment:    Annual wellness medicare exam   Plan:    During the course of the visit the patient was educated and counseled about appropriate screening and preventive services including:   Flu vaccine given; pneumococcal vaccines are  up-to-date; tetanus immunization is up-to-date  Patient.  Be a candidate for Cologuard   Patient Instructions (the written plan) was given to the patient.  Medicare Attestation  I have personally reviewed:  The patient's medical and social history  Their use of alcohol, tobacco or illicit drugs  Their current medications and supplements  The patient's functional ability including ADLs,fall risks, home safety risks, cognitive, and hearing and visual impairment  Diet and physical activities  Evidence for depression or mood disorders  The patient's weight, height, BMI, and visual acuity have been recorded in the chart. I have made referrals, counseling, and provided  education to the patient based on review of the above and I have provided the patient with a written personalized care plan for preventive services.

## 2018-11-18 NOTE — Patient Instructions (Signed)
It was a pleasure to see you today. Flu vaccine given. RTC in one year or as needed.

## 2018-11-26 DIAGNOSIS — M5386 Other specified dorsopathies, lumbar region: Secondary | ICD-10-CM | POA: Diagnosis not present

## 2018-11-26 DIAGNOSIS — M9902 Segmental and somatic dysfunction of thoracic region: Secondary | ICD-10-CM | POA: Diagnosis not present

## 2018-11-26 DIAGNOSIS — M5414 Radiculopathy, thoracic region: Secondary | ICD-10-CM | POA: Diagnosis not present

## 2018-11-26 DIAGNOSIS — M9901 Segmental and somatic dysfunction of cervical region: Secondary | ICD-10-CM | POA: Diagnosis not present

## 2018-11-26 DIAGNOSIS — M9903 Segmental and somatic dysfunction of lumbar region: Secondary | ICD-10-CM | POA: Diagnosis not present

## 2018-11-26 DIAGNOSIS — M5032 Other cervical disc degeneration, mid-cervical region, unspecified level: Secondary | ICD-10-CM | POA: Diagnosis not present

## 2018-12-06 ENCOUNTER — Other Ambulatory Visit: Payer: Self-pay

## 2018-12-06 ENCOUNTER — Ambulatory Visit (INDEPENDENT_AMBULATORY_CARE_PROVIDER_SITE_OTHER): Payer: Medicare Other | Admitting: Internal Medicine

## 2018-12-06 ENCOUNTER — Encounter: Payer: Self-pay | Admitting: Internal Medicine

## 2018-12-06 DIAGNOSIS — Z23 Encounter for immunization: Secondary | ICD-10-CM | POA: Diagnosis not present

## 2018-12-06 NOTE — Progress Notes (Signed)
Flu vaccine by CMA 

## 2018-12-06 NOTE — Patient Instructions (Signed)
Patient received a flu vaccine IM L deltoid, AV, CMA  

## 2018-12-12 ENCOUNTER — Encounter: Payer: Self-pay | Admitting: Internal Medicine

## 2018-12-26 ENCOUNTER — Telehealth: Payer: Self-pay

## 2018-12-26 NOTE — Telephone Encounter (Signed)
Patient had a depo shot on 10/05 for phlegm building in her throat and it got better but now it is building back again. She wants to know if she can come back to get a shot?  No cough no temperature.

## 2018-12-26 NOTE — Telephone Encounter (Signed)
Referral put in epic and proficient.   

## 2018-12-26 NOTE — Telephone Encounter (Signed)
We don't give those shots very often. This sounds like allergies and would suggest allergy evaluation.

## 2018-12-26 NOTE — Telephone Encounter (Signed)
Called patient and she verbalized understanding and would like to be referred to allergy

## 2019-01-07 ENCOUNTER — Other Ambulatory Visit: Payer: Self-pay

## 2019-01-22 ENCOUNTER — Ambulatory Visit: Payer: Medicare Other | Admitting: Allergy

## 2019-02-12 ENCOUNTER — Ambulatory Visit: Payer: Medicare Other | Admitting: Allergy

## 2019-02-19 ENCOUNTER — Other Ambulatory Visit: Payer: Self-pay | Admitting: Cardiology

## 2019-02-19 DIAGNOSIS — M5032 Other cervical disc degeneration, mid-cervical region, unspecified level: Secondary | ICD-10-CM | POA: Diagnosis not present

## 2019-02-19 DIAGNOSIS — M5414 Radiculopathy, thoracic region: Secondary | ICD-10-CM | POA: Diagnosis not present

## 2019-02-19 DIAGNOSIS — M9901 Segmental and somatic dysfunction of cervical region: Secondary | ICD-10-CM | POA: Diagnosis not present

## 2019-02-19 DIAGNOSIS — Z20822 Contact with and (suspected) exposure to covid-19: Secondary | ICD-10-CM

## 2019-02-19 DIAGNOSIS — M9903 Segmental and somatic dysfunction of lumbar region: Secondary | ICD-10-CM | POA: Diagnosis not present

## 2019-02-19 DIAGNOSIS — M9902 Segmental and somatic dysfunction of thoracic region: Secondary | ICD-10-CM | POA: Diagnosis not present

## 2019-02-19 DIAGNOSIS — M5386 Other specified dorsopathies, lumbar region: Secondary | ICD-10-CM | POA: Diagnosis not present

## 2019-02-20 LAB — NOVEL CORONAVIRUS, NAA: SARS-CoV-2, NAA: NOT DETECTED

## 2019-02-24 DIAGNOSIS — M9902 Segmental and somatic dysfunction of thoracic region: Secondary | ICD-10-CM | POA: Diagnosis not present

## 2019-02-24 DIAGNOSIS — M9901 Segmental and somatic dysfunction of cervical region: Secondary | ICD-10-CM | POA: Diagnosis not present

## 2019-02-24 DIAGNOSIS — M5414 Radiculopathy, thoracic region: Secondary | ICD-10-CM | POA: Diagnosis not present

## 2019-02-24 DIAGNOSIS — M5032 Other cervical disc degeneration, mid-cervical region, unspecified level: Secondary | ICD-10-CM | POA: Diagnosis not present

## 2019-02-24 DIAGNOSIS — M5386 Other specified dorsopathies, lumbar region: Secondary | ICD-10-CM | POA: Diagnosis not present

## 2019-02-24 DIAGNOSIS — M9903 Segmental and somatic dysfunction of lumbar region: Secondary | ICD-10-CM | POA: Diagnosis not present

## 2019-02-28 DIAGNOSIS — M5414 Radiculopathy, thoracic region: Secondary | ICD-10-CM | POA: Diagnosis not present

## 2019-02-28 DIAGNOSIS — M9901 Segmental and somatic dysfunction of cervical region: Secondary | ICD-10-CM | POA: Diagnosis not present

## 2019-02-28 DIAGNOSIS — M9902 Segmental and somatic dysfunction of thoracic region: Secondary | ICD-10-CM | POA: Diagnosis not present

## 2019-02-28 DIAGNOSIS — M5032 Other cervical disc degeneration, mid-cervical region, unspecified level: Secondary | ICD-10-CM | POA: Diagnosis not present

## 2019-02-28 DIAGNOSIS — M5386 Other specified dorsopathies, lumbar region: Secondary | ICD-10-CM | POA: Diagnosis not present

## 2019-02-28 DIAGNOSIS — M9903 Segmental and somatic dysfunction of lumbar region: Secondary | ICD-10-CM | POA: Diagnosis not present

## 2019-03-03 ENCOUNTER — Ambulatory Visit (INDEPENDENT_AMBULATORY_CARE_PROVIDER_SITE_OTHER): Payer: Medicare Other | Admitting: Allergy and Immunology

## 2019-03-03 ENCOUNTER — Encounter: Payer: Self-pay | Admitting: Allergy and Immunology

## 2019-03-03 ENCOUNTER — Other Ambulatory Visit: Payer: Self-pay

## 2019-03-03 VITALS — BP 130/80 | HR 74 | Temp 98.6°F | Resp 18 | Ht 61.0 in | Wt 198.4 lb

## 2019-03-03 DIAGNOSIS — J310 Chronic rhinitis: Secondary | ICD-10-CM

## 2019-03-03 MED ORDER — AZELASTINE-FLUTICASONE 137-50 MCG/ACT NA SUSP: 1.0000 | Freq: Two times a day (BID) | NASAL | 3 refills | Status: DC | PRN

## 2019-03-03 NOTE — Patient Instructions (Addendum)
Chronic rhinitis Non-allergic rhinitis.  All seasonal and perennial aeroallergen skin tests are negative despite a positive histamine control.  Intranasal steroids and intranasal antihistamines are effective for symptoms associated with non-allergic rhinitis, whereas second generation antihistamines such as cetirizine, loratadine and fexofenadine have been found to be ineffective for this condition.  A prescription has been provided for azelastine/fluticasone nasal spray, one spray per nostril 1-2 times daily as needed. Proper nasal spray technique has been discussed and demonstrated.  Nasal saline lavage (NeilMed) has been recommended as needed and prior to medicated nasal sprays along with instructions for proper administration.  For thick post nasal drainage, add guaifenesin [phone] mg (Mucinex)  twice daily as needed with adequate hydration as discussed.  If this problem persists or progresses despite treatment plan as outlined above, we will consider a therapeutic trial of antacid for possible silent reflux.   Return if symptoms worsen or fail to improve.

## 2019-03-03 NOTE — Progress Notes (Signed)
New Patient Note  RE: Kelly [name] MRN: [id number] DOB: 09-06-1945 Date of Office Visit: 03/03/2019  Referring provider: Elby Showers, MD Primary care provider: Elby Showers, MD  Chief Complaint: Allergic Rhinitis  (postnasal drainage)   History of present illness: Kelly [name] is a 74 y.o. female seen today in consultation requested by Tedra Senegal, MD.  She reports that over the past 6 to 7 months she has experienced thick postnasal drainage.  The postnasal drainage is worse at nighttime, otherwise no significant seasonal symptom variation has been noted nor have specific environmental triggers been identified.  She has not tried medical intervention at this point because she prefers not to take medications if possible.  She denies significant nasal congestion, rhinorrhea, sneezing, nasal pruritus, ocular pruritus, and sinus pressure.  She denies heartburn and water brash.  Assessment and plan: Chronic rhinitis Non-allergic rhinitis.  All seasonal and perennial aeroallergen skin tests are negative despite a positive histamine control.  Intranasal steroids and intranasal antihistamines are effective for symptoms associated with non-allergic rhinitis, whereas second generation antihistamines such as cetirizine, loratadine and fexofenadine have been found to be ineffective for this condition.  A prescription has been provided for azelastine/fluticasone nasal spray, one spray per nostril 1-2 times daily as needed. Proper nasal spray technique has been discussed and demonstrated.  Nasal saline lavage (NeilMed) has been recommended as needed and prior to medicated nasal sprays along with instructions for proper administration.  For thick post nasal drainage, add guaifenesin [phone] mg (Mucinex)  twice daily as needed with adequate hydration as discussed.  If this problem persists or progresses despite treatment plan as outlined above, we will consider a therapeutic trial of  antacid for possible silent reflux.   Meds ordered this encounter  Medications  . Azelastine-Fluticasone 137-50 MCG/ACT SUSP    Sig: Place 1 spray into the nose 2 (two) times daily as needed.    Dispense:  23 g    Refill:  3    Diagnostics: Epicutaneous testing: Negative despite a positive histamine control. Intradermal testing: Negative despite a positive histamine control.   Physical examination: Blood pressure 130/80, pulse 74, temperature 98.6 F (37 C), temperature source Temporal, resp. rate 18, height 5\' 1"  (1.549 m), weight 198 lb 6.4 oz (90 kg), SpO2 95 %.  General: Alert, interactive, in no acute distress. HEENT: TMs pearly gray, turbinates mildly edematous without discharge, post-pharynx moderately erythematous. Neck: Supple without lymphadenopathy. Lungs: Clear to auscultation without wheezing, rhonchi or rales. CV: Normal S1, S2 without murmurs. Abdomen: Nondistended, nontender. Skin: Warm and dry, without lesions or rashes. Extremities:  No clubbing, cyanosis or edema. Neuro:   Grossly intact.  Review of systems:  Review of systems negative except as noted in HPI / PMHx or noted below: Review of Systems  Constitutional: Negative.   HENT: Negative.   Eyes: Negative.   Respiratory: Negative.   Cardiovascular: Negative.   Gastrointestinal: Negative.   Genitourinary: Negative.   Musculoskeletal: Negative.   Skin: Negative.   Neurological: Negative.   Endo/Heme/Allergies: Negative.   Psychiatric/Behavioral: Negative.     Past medical history:  Past Medical History:  Diagnosis Date  . Depression   . Hyperlipidemia   . Hypertension     Past surgical history:  Past Surgical History:  Procedure Laterality Date  . ABDOMINAL HYSTERECTOMY    . BREAST REDUCTION SURGERY     and lift  . BREAST REDUCTION SURGERY Bilateral   . CARPAL TUNNEL RELEASE  05/11  right  . CHOLECYSTECTOMY    . FACIAL COSMETIC SURGERY  2/10  . KNEE ARTHROSCOPY  2007   left &  right  . LIPOSUCTION EXTREMITIES     thighs  . PARTIAL KNEE ARTHROPLASTY Right 10/02/2014   Procedure: RIGHT KNEE MEDIAL UNICOMPARTMENTAL ARTHROPLASTY;  Surgeon: Mcarthur Rossetti, MD;  Location: [facility];  Service: Orthopedics;  Laterality: Right;  . REDUCTION MAMMAPLASTY     bilateral  . TOTAL KNEE REVISION  10/12/2011   Procedure: TOTAL KNEE REVISION;  Surgeon: Mcarthur Rossetti, MD;  Location: [facility];  Service: Orthopedics;  Laterality: Left;  Left Total Knee Revision Arthroplasty  . TRIGGER FINGER RELEASE  5/11   right 4th finger    Family history: Family History  Problem Relation Age of Onset  . Heart disease Mother   . Alcohol abuse Father   . Hypertension Father   . Cancer Sister   . Hypertension Sister   . Breast cancer Neg Hx     Social history: Social History   Socioeconomic History  . Marital status: Divorced    Spouse name: Not on file  . Number of children: Not on file  . Years of education: Not on file  . Highest education level: Not on file  Occupational History  . Not on file  Tobacco Use  . Smoking status: Never Smoker  . Smokeless tobacco: Never Used  Substance and Sexual Activity  . Alcohol use: Yes    Alcohol/week: 14.0 standard drinks    Types: 14 Glasses of wine per week    Comment: daily 1- 2  . Drug use: No  . Sexual activity: Not on file  Other Topics Concern  . Not on file  Social History Narrative  . Not on file   Social Determinants of Health   Financial Resource Strain:   . Difficulty of Paying Living Expenses: Not on file  Food Insecurity:   . Worried About Charity fundraiser in the Last Year: Not on file  . Ran Out of Food in the Last Year: Not on file  Transportation Needs:   . Lack of Transportation (Medical): Not on file  . Lack of Transportation (Non-Medical): Not on file  Physical Activity:   . Days of Exercise per Week: Not on file  . Minutes of Exercise per Session: Not on file  Stress:   . Feeling of Stress  : Not on file  Social Connections:   . Frequency of Communication with Friends and Family: Not on file  . Frequency of Social Gatherings with Friends and Family: Not on file  . Attends Religious Services: Not on file  . Active Member of Clubs or Organizations: Not on file  . Attends Archivist Meetings: Not on file  . Marital Status: Not on file  Intimate Partner Violence:   . Fear of Current or Ex-Partner: Not on file  . Emotionally Abused: Not on file  . Physically Abused: Not on file  . Sexually Abused: Not on file    Environmental History: The patient lives in a 76 year old house with carpeting in the bedroom, gas heat, and central air.  There is no known mold/water damage in the home.  There are 2 dogs in the home which have access to her bedroom.  She is a non-smoker.  Current Outpatient Medications  Medication Sig Dispense Refill  . amLODipine (NORVASC) 5 MG tablet TAKE 1 TABLET BY MOUTH EVERY DAY 90 tablet 3  . rosuvastatin (CRESTOR) 10 MG  tablet Take 1 tablet (10 mg total) by mouth daily. 90 tablet 3  . Azelastine-Fluticasone 137-50 MCG/ACT SUSP Place 1 spray into the nose 2 (two) times daily as needed. 23 g 3   No current facility-administered medications for this visit.    Known medication allergies: Allergies  Allergen Reactions  . Penicillins Hives    Had to use an epi pen    I appreciate the opportunity to take part in Shyteria's care. Please do not hesitate to contact me with questions.  Sincerely,   R. Edgar Frisk, MD

## 2019-03-03 NOTE — Assessment & Plan Note (Addendum)
Non-allergic rhinitis.  All seasonal and perennial aeroallergen skin tests are negative despite a positive histamine control.  Intranasal steroids and intranasal antihistamines are effective for symptoms associated with non-allergic rhinitis, whereas second generation antihistamines such as cetirizine, loratadine and fexofenadine have been found to be ineffective for this condition.  A prescription has been provided for azelastine/fluticasone nasal spray, one spray per nostril 1-2 times daily as needed. Proper nasal spray technique has been discussed and demonstrated.  Nasal saline lavage (NeilMed) has been recommended as needed and prior to medicated nasal sprays along with instructions for proper administration.  For thick post nasal drainage, add guaifenesin [phone] mg (Mucinex)  twice daily as needed with adequate hydration as discussed.  If this problem persists or progresses despite treatment plan as outlined above, we will consider a therapeutic trial of antacid for possible silent reflux.

## 2019-03-06 DIAGNOSIS — M5414 Radiculopathy, thoracic region: Secondary | ICD-10-CM | POA: Diagnosis not present

## 2019-03-06 DIAGNOSIS — M5386 Other specified dorsopathies, lumbar region: Secondary | ICD-10-CM | POA: Diagnosis not present

## 2019-03-06 DIAGNOSIS — M9902 Segmental and somatic dysfunction of thoracic region: Secondary | ICD-10-CM | POA: Diagnosis not present

## 2019-03-06 DIAGNOSIS — M9903 Segmental and somatic dysfunction of lumbar region: Secondary | ICD-10-CM | POA: Diagnosis not present

## 2019-03-06 DIAGNOSIS — M9901 Segmental and somatic dysfunction of cervical region: Secondary | ICD-10-CM | POA: Diagnosis not present

## 2019-03-06 DIAGNOSIS — M5032 Other cervical disc degeneration, mid-cervical region, unspecified level: Secondary | ICD-10-CM | POA: Diagnosis not present

## 2019-03-13 DIAGNOSIS — M9903 Segmental and somatic dysfunction of lumbar region: Secondary | ICD-10-CM | POA: Diagnosis not present

## 2019-03-13 DIAGNOSIS — M5414 Radiculopathy, thoracic region: Secondary | ICD-10-CM | POA: Diagnosis not present

## 2019-03-13 DIAGNOSIS — M5032 Other cervical disc degeneration, mid-cervical region, unspecified level: Secondary | ICD-10-CM | POA: Diagnosis not present

## 2019-03-13 DIAGNOSIS — M9901 Segmental and somatic dysfunction of cervical region: Secondary | ICD-10-CM | POA: Diagnosis not present

## 2019-03-13 DIAGNOSIS — M5386 Other specified dorsopathies, lumbar region: Secondary | ICD-10-CM | POA: Diagnosis not present

## 2019-03-13 DIAGNOSIS — M9902 Segmental and somatic dysfunction of thoracic region: Secondary | ICD-10-CM | POA: Diagnosis not present

## 2019-03-14 DIAGNOSIS — L821 Other seborrheic keratosis: Secondary | ICD-10-CM | POA: Diagnosis not present

## 2019-03-14 DIAGNOSIS — L814 Other melanin hyperpigmentation: Secondary | ICD-10-CM | POA: Diagnosis not present

## 2019-03-14 DIAGNOSIS — L820 Inflamed seborrheic keratosis: Secondary | ICD-10-CM | POA: Diagnosis not present

## 2019-03-14 DIAGNOSIS — L578 Other skin changes due to chronic exposure to nonionizing radiation: Secondary | ICD-10-CM | POA: Diagnosis not present

## 2019-03-28 DIAGNOSIS — R351 Nocturia: Secondary | ICD-10-CM | POA: Diagnosis not present

## 2019-03-28 DIAGNOSIS — N3281 Overactive bladder: Secondary | ICD-10-CM | POA: Diagnosis not present

## 2019-04-21 DIAGNOSIS — M6281 Muscle weakness (generalized): Secondary | ICD-10-CM | POA: Diagnosis not present

## 2019-04-21 DIAGNOSIS — R351 Nocturia: Secondary | ICD-10-CM | POA: Diagnosis not present

## 2019-04-21 DIAGNOSIS — N3281 Overactive bladder: Secondary | ICD-10-CM | POA: Diagnosis not present

## 2019-04-21 DIAGNOSIS — M6289 Other specified disorders of muscle: Secondary | ICD-10-CM | POA: Diagnosis not present

## 2019-04-21 DIAGNOSIS — M62838 Other muscle spasm: Secondary | ICD-10-CM | POA: Diagnosis not present

## 2019-05-01 DIAGNOSIS — M9903 Segmental and somatic dysfunction of lumbar region: Secondary | ICD-10-CM | POA: Diagnosis not present

## 2019-05-01 DIAGNOSIS — M5414 Radiculopathy, thoracic region: Secondary | ICD-10-CM | POA: Diagnosis not present

## 2019-05-01 DIAGNOSIS — M5386 Other specified dorsopathies, lumbar region: Secondary | ICD-10-CM | POA: Diagnosis not present

## 2019-05-01 DIAGNOSIS — M5032 Other cervical disc degeneration, mid-cervical region, unspecified level: Secondary | ICD-10-CM | POA: Diagnosis not present

## 2019-05-01 DIAGNOSIS — M9902 Segmental and somatic dysfunction of thoracic region: Secondary | ICD-10-CM | POA: Diagnosis not present

## 2019-05-01 DIAGNOSIS — M9901 Segmental and somatic dysfunction of cervical region: Secondary | ICD-10-CM | POA: Diagnosis not present

## 2019-05-05 DIAGNOSIS — M9901 Segmental and somatic dysfunction of cervical region: Secondary | ICD-10-CM | POA: Diagnosis not present

## 2019-05-05 DIAGNOSIS — M5414 Radiculopathy, thoracic region: Secondary | ICD-10-CM | POA: Diagnosis not present

## 2019-05-05 DIAGNOSIS — M5386 Other specified dorsopathies, lumbar region: Secondary | ICD-10-CM | POA: Diagnosis not present

## 2019-05-05 DIAGNOSIS — M9902 Segmental and somatic dysfunction of thoracic region: Secondary | ICD-10-CM | POA: Diagnosis not present

## 2019-05-05 DIAGNOSIS — M9903 Segmental and somatic dysfunction of lumbar region: Secondary | ICD-10-CM | POA: Diagnosis not present

## 2019-05-05 DIAGNOSIS — M5032 Other cervical disc degeneration, mid-cervical region, unspecified level: Secondary | ICD-10-CM | POA: Diagnosis not present

## 2019-05-15 ENCOUNTER — Ambulatory Visit: Payer: Medicare Other | Attending: Internal Medicine

## 2019-05-15 DIAGNOSIS — Z23 Encounter for immunization: Secondary | ICD-10-CM

## 2019-05-15 NOTE — Progress Notes (Signed)
   Covid-19 Vaccination Clinic  Name:  Kelly [name]    MRN: [id number] DOB: Oct 29, 1945  05/15/2019  [name] was observed post Covid-19 immunization for 15 minutes without incident. She was provided with Vaccine Information Sheet and instruction to access the V-Safe system.   [name] was instructed to call 911 with any severe reactions post vaccine: Marland Kitchen Difficulty breathing  . Swelling of face and throat  . A fast heartbeat  . A bad rash all over body  . Dizziness and weakness   Immunizations Administered    Name Date Dose VIS Date Route   Pfizer COVID-19 Vaccine 05/15/2019  4:45 PM 0.3 mL 01/24/2019 Intramuscular   Manufacturer: [employer], [employer]   Lot: [id number]   [place]: [id number]

## 2019-05-29 DIAGNOSIS — M6289 Other specified disorders of muscle: Secondary | ICD-10-CM | POA: Diagnosis not present

## 2019-05-29 DIAGNOSIS — N3281 Overactive bladder: Secondary | ICD-10-CM | POA: Diagnosis not present

## 2019-05-29 DIAGNOSIS — R351 Nocturia: Secondary | ICD-10-CM | POA: Diagnosis not present

## 2019-05-29 DIAGNOSIS — M62838 Other muscle spasm: Secondary | ICD-10-CM | POA: Diagnosis not present

## 2019-05-29 DIAGNOSIS — M6281 Muscle weakness (generalized): Secondary | ICD-10-CM | POA: Diagnosis not present

## 2019-06-09 ENCOUNTER — Ambulatory Visit: Payer: Medicare Other | Attending: Internal Medicine

## 2019-06-09 DIAGNOSIS — Z23 Encounter for immunization: Secondary | ICD-10-CM

## 2019-06-09 NOTE — Progress Notes (Signed)
   Covid-19 Vaccination Clinic  Name:  TWINKLE OPPERMANN    MRN: [id number] DOB: January 18, 1946  06/09/2019  Ms. Pano was observed post Covid-19 immunization for 15 minutes without incident. She was provided with Vaccine Information Sheet and instruction to access the V-Safe system.   Ms. Litterio was instructed to call 911 with any severe reactions post vaccine: Marland Kitchen Difficulty breathing  . Swelling of face and throat  . A fast heartbeat  . A bad rash all over body  . Dizziness and weakness   Immunizations Administered    Name Date Dose VIS Date Route   Pfizer COVID-19 Vaccine 06/09/2019  5:00 PM 0.3 mL 04/09/2018 Intramuscular   Manufacturer: [place]   Lot: [id number]   [place]: [id number]

## 2019-07-28 ENCOUNTER — Other Ambulatory Visit: Payer: Self-pay

## 2019-07-28 MED ORDER — ROSUVASTATIN CALCIUM 10 MG PO TABS: 10.0000 mg | ORAL_TABLET | Freq: Every day | ORAL | 1 refills | Status: DC

## 2019-08-27 ENCOUNTER — Other Ambulatory Visit: Payer: Self-pay | Admitting: Internal Medicine

## 2019-08-27 DIAGNOSIS — Z1231 Encounter for screening mammogram for malignant neoplasm of breast: Secondary | ICD-10-CM

## 2019-08-29 DIAGNOSIS — M5386 Other specified dorsopathies, lumbar region: Secondary | ICD-10-CM | POA: Diagnosis not present

## 2019-08-29 DIAGNOSIS — M5414 Radiculopathy, thoracic region: Secondary | ICD-10-CM | POA: Diagnosis not present

## 2019-08-29 DIAGNOSIS — M9901 Segmental and somatic dysfunction of cervical region: Secondary | ICD-10-CM | POA: Diagnosis not present

## 2019-08-29 DIAGNOSIS — M9903 Segmental and somatic dysfunction of lumbar region: Secondary | ICD-10-CM | POA: Diagnosis not present

## 2019-08-29 DIAGNOSIS — M9902 Segmental and somatic dysfunction of thoracic region: Secondary | ICD-10-CM | POA: Diagnosis not present

## 2019-08-29 DIAGNOSIS — M5032 Other cervical disc degeneration, mid-cervical region, unspecified level: Secondary | ICD-10-CM | POA: Diagnosis not present

## 2019-09-02 DIAGNOSIS — M5386 Other specified dorsopathies, lumbar region: Secondary | ICD-10-CM | POA: Diagnosis not present

## 2019-09-02 DIAGNOSIS — M5032 Other cervical disc degeneration, mid-cervical region, unspecified level: Secondary | ICD-10-CM | POA: Diagnosis not present

## 2019-09-02 DIAGNOSIS — M9901 Segmental and somatic dysfunction of cervical region: Secondary | ICD-10-CM | POA: Diagnosis not present

## 2019-09-02 DIAGNOSIS — M5414 Radiculopathy, thoracic region: Secondary | ICD-10-CM | POA: Diagnosis not present

## 2019-09-02 DIAGNOSIS — M9903 Segmental and somatic dysfunction of lumbar region: Secondary | ICD-10-CM | POA: Diagnosis not present

## 2019-09-02 DIAGNOSIS — M9902 Segmental and somatic dysfunction of thoracic region: Secondary | ICD-10-CM | POA: Diagnosis not present

## 2019-09-03 ENCOUNTER — Ambulatory Visit
Admission: RE | Admit: 2019-09-03 | Discharge: 2019-09-03 | Disposition: A | Discharge status: Home / Self Care | Payer: Medicare Other | Source: Ambulatory Visit | Attending: Internal Medicine | Admitting: Internal Medicine

## 2019-09-03 ENCOUNTER — Other Ambulatory Visit: Payer: Self-pay

## 2019-09-03 DIAGNOSIS — Z1231 Encounter for screening mammogram for malignant neoplasm of breast: Secondary | ICD-10-CM | POA: Diagnosis not present

## 2019-09-05 DIAGNOSIS — M9903 Segmental and somatic dysfunction of lumbar region: Secondary | ICD-10-CM | POA: Diagnosis not present

## 2019-09-05 DIAGNOSIS — M5414 Radiculopathy, thoracic region: Secondary | ICD-10-CM | POA: Diagnosis not present

## 2019-09-05 DIAGNOSIS — M9901 Segmental and somatic dysfunction of cervical region: Secondary | ICD-10-CM | POA: Diagnosis not present

## 2019-09-05 DIAGNOSIS — M9902 Segmental and somatic dysfunction of thoracic region: Secondary | ICD-10-CM | POA: Diagnosis not present

## 2019-09-05 DIAGNOSIS — M5032 Other cervical disc degeneration, mid-cervical region, unspecified level: Secondary | ICD-10-CM | POA: Diagnosis not present

## 2019-09-05 DIAGNOSIS — M5386 Other specified dorsopathies, lumbar region: Secondary | ICD-10-CM | POA: Diagnosis not present

## 2019-09-09 DIAGNOSIS — M9903 Segmental and somatic dysfunction of lumbar region: Secondary | ICD-10-CM | POA: Diagnosis not present

## 2019-09-09 DIAGNOSIS — M5414 Radiculopathy, thoracic region: Secondary | ICD-10-CM | POA: Diagnosis not present

## 2019-09-09 DIAGNOSIS — M9902 Segmental and somatic dysfunction of thoracic region: Secondary | ICD-10-CM | POA: Diagnosis not present

## 2019-09-09 DIAGNOSIS — M5032 Other cervical disc degeneration, mid-cervical region, unspecified level: Secondary | ICD-10-CM | POA: Diagnosis not present

## 2019-09-09 DIAGNOSIS — M5386 Other specified dorsopathies, lumbar region: Secondary | ICD-10-CM | POA: Diagnosis not present

## 2019-09-09 DIAGNOSIS — M9901 Segmental and somatic dysfunction of cervical region: Secondary | ICD-10-CM | POA: Diagnosis not present

## 2019-09-12 DIAGNOSIS — M5386 Other specified dorsopathies, lumbar region: Secondary | ICD-10-CM | POA: Diagnosis not present

## 2019-09-12 DIAGNOSIS — M5032 Other cervical disc degeneration, mid-cervical region, unspecified level: Secondary | ICD-10-CM | POA: Diagnosis not present

## 2019-09-12 DIAGNOSIS — M9901 Segmental and somatic dysfunction of cervical region: Secondary | ICD-10-CM | POA: Diagnosis not present

## 2019-09-12 DIAGNOSIS — M9902 Segmental and somatic dysfunction of thoracic region: Secondary | ICD-10-CM | POA: Diagnosis not present

## 2019-09-12 DIAGNOSIS — M9903 Segmental and somatic dysfunction of lumbar region: Secondary | ICD-10-CM | POA: Diagnosis not present

## 2019-09-12 DIAGNOSIS — M5414 Radiculopathy, thoracic region: Secondary | ICD-10-CM | POA: Diagnosis not present

## 2019-09-16 DIAGNOSIS — M5032 Other cervical disc degeneration, mid-cervical region, unspecified level: Secondary | ICD-10-CM | POA: Diagnosis not present

## 2019-09-16 DIAGNOSIS — M5386 Other specified dorsopathies, lumbar region: Secondary | ICD-10-CM | POA: Diagnosis not present

## 2019-09-16 DIAGNOSIS — M5414 Radiculopathy, thoracic region: Secondary | ICD-10-CM | POA: Diagnosis not present

## 2019-09-16 DIAGNOSIS — M9901 Segmental and somatic dysfunction of cervical region: Secondary | ICD-10-CM | POA: Diagnosis not present

## 2019-09-16 DIAGNOSIS — M9902 Segmental and somatic dysfunction of thoracic region: Secondary | ICD-10-CM | POA: Diagnosis not present

## 2019-09-16 DIAGNOSIS — M9903 Segmental and somatic dysfunction of lumbar region: Secondary | ICD-10-CM | POA: Diagnosis not present

## 2019-09-18 DIAGNOSIS — M5414 Radiculopathy, thoracic region: Secondary | ICD-10-CM | POA: Diagnosis not present

## 2019-09-18 DIAGNOSIS — M9901 Segmental and somatic dysfunction of cervical region: Secondary | ICD-10-CM | POA: Diagnosis not present

## 2019-09-18 DIAGNOSIS — M5386 Other specified dorsopathies, lumbar region: Secondary | ICD-10-CM | POA: Diagnosis not present

## 2019-09-18 DIAGNOSIS — M9903 Segmental and somatic dysfunction of lumbar region: Secondary | ICD-10-CM | POA: Diagnosis not present

## 2019-09-18 DIAGNOSIS — M5032 Other cervical disc degeneration, mid-cervical region, unspecified level: Secondary | ICD-10-CM | POA: Diagnosis not present

## 2019-09-18 DIAGNOSIS — M9902 Segmental and somatic dysfunction of thoracic region: Secondary | ICD-10-CM | POA: Diagnosis not present

## 2019-09-26 DIAGNOSIS — M5032 Other cervical disc degeneration, mid-cervical region, unspecified level: Secondary | ICD-10-CM | POA: Diagnosis not present

## 2019-09-26 DIAGNOSIS — M9901 Segmental and somatic dysfunction of cervical region: Secondary | ICD-10-CM | POA: Diagnosis not present

## 2019-09-26 DIAGNOSIS — M5414 Radiculopathy, thoracic region: Secondary | ICD-10-CM | POA: Diagnosis not present

## 2019-09-26 DIAGNOSIS — M9903 Segmental and somatic dysfunction of lumbar region: Secondary | ICD-10-CM | POA: Diagnosis not present

## 2019-09-26 DIAGNOSIS — M9902 Segmental and somatic dysfunction of thoracic region: Secondary | ICD-10-CM | POA: Diagnosis not present

## 2019-09-26 DIAGNOSIS — M5386 Other specified dorsopathies, lumbar region: Secondary | ICD-10-CM | POA: Diagnosis not present

## 2019-10-07 DIAGNOSIS — M9902 Segmental and somatic dysfunction of thoracic region: Secondary | ICD-10-CM | POA: Diagnosis not present

## 2019-10-07 DIAGNOSIS — M5386 Other specified dorsopathies, lumbar region: Secondary | ICD-10-CM | POA: Diagnosis not present

## 2019-10-07 DIAGNOSIS — M9901 Segmental and somatic dysfunction of cervical region: Secondary | ICD-10-CM | POA: Diagnosis not present

## 2019-10-07 DIAGNOSIS — M5414 Radiculopathy, thoracic region: Secondary | ICD-10-CM | POA: Diagnosis not present

## 2019-10-07 DIAGNOSIS — M9903 Segmental and somatic dysfunction of lumbar region: Secondary | ICD-10-CM | POA: Diagnosis not present

## 2019-10-07 DIAGNOSIS — M5032 Other cervical disc degeneration, mid-cervical region, unspecified level: Secondary | ICD-10-CM | POA: Diagnosis not present

## 2019-10-08 DIAGNOSIS — M9903 Segmental and somatic dysfunction of lumbar region: Secondary | ICD-10-CM | POA: Diagnosis not present

## 2019-10-08 DIAGNOSIS — M9901 Segmental and somatic dysfunction of cervical region: Secondary | ICD-10-CM | POA: Diagnosis not present

## 2019-10-08 DIAGNOSIS — M9902 Segmental and somatic dysfunction of thoracic region: Secondary | ICD-10-CM | POA: Diagnosis not present

## 2019-10-08 DIAGNOSIS — M5414 Radiculopathy, thoracic region: Secondary | ICD-10-CM | POA: Diagnosis not present

## 2019-10-08 DIAGNOSIS — M5032 Other cervical disc degeneration, mid-cervical region, unspecified level: Secondary | ICD-10-CM | POA: Diagnosis not present

## 2019-10-08 DIAGNOSIS — M5386 Other specified dorsopathies, lumbar region: Secondary | ICD-10-CM | POA: Diagnosis not present

## 2019-10-10 DIAGNOSIS — M5414 Radiculopathy, thoracic region: Secondary | ICD-10-CM | POA: Diagnosis not present

## 2019-10-10 DIAGNOSIS — M9901 Segmental and somatic dysfunction of cervical region: Secondary | ICD-10-CM | POA: Diagnosis not present

## 2019-10-10 DIAGNOSIS — M5386 Other specified dorsopathies, lumbar region: Secondary | ICD-10-CM | POA: Diagnosis not present

## 2019-10-10 DIAGNOSIS — M9903 Segmental and somatic dysfunction of lumbar region: Secondary | ICD-10-CM | POA: Diagnosis not present

## 2019-10-10 DIAGNOSIS — M9902 Segmental and somatic dysfunction of thoracic region: Secondary | ICD-10-CM | POA: Diagnosis not present

## 2019-10-10 DIAGNOSIS — M5032 Other cervical disc degeneration, mid-cervical region, unspecified level: Secondary | ICD-10-CM | POA: Diagnosis not present

## 2019-10-14 DIAGNOSIS — M5386 Other specified dorsopathies, lumbar region: Secondary | ICD-10-CM | POA: Diagnosis not present

## 2019-10-14 DIAGNOSIS — M9903 Segmental and somatic dysfunction of lumbar region: Secondary | ICD-10-CM | POA: Diagnosis not present

## 2019-10-14 DIAGNOSIS — M5032 Other cervical disc degeneration, mid-cervical region, unspecified level: Secondary | ICD-10-CM | POA: Diagnosis not present

## 2019-10-14 DIAGNOSIS — M9902 Segmental and somatic dysfunction of thoracic region: Secondary | ICD-10-CM | POA: Diagnosis not present

## 2019-10-14 DIAGNOSIS — M9901 Segmental and somatic dysfunction of cervical region: Secondary | ICD-10-CM | POA: Diagnosis not present

## 2019-10-14 DIAGNOSIS — M5414 Radiculopathy, thoracic region: Secondary | ICD-10-CM | POA: Diagnosis not present

## 2019-10-15 DIAGNOSIS — M5032 Other cervical disc degeneration, mid-cervical region, unspecified level: Secondary | ICD-10-CM | POA: Diagnosis not present

## 2019-10-15 DIAGNOSIS — M9902 Segmental and somatic dysfunction of thoracic region: Secondary | ICD-10-CM | POA: Diagnosis not present

## 2019-10-15 DIAGNOSIS — M5386 Other specified dorsopathies, lumbar region: Secondary | ICD-10-CM | POA: Diagnosis not present

## 2019-10-15 DIAGNOSIS — M9903 Segmental and somatic dysfunction of lumbar region: Secondary | ICD-10-CM | POA: Diagnosis not present

## 2019-10-15 DIAGNOSIS — M5414 Radiculopathy, thoracic region: Secondary | ICD-10-CM | POA: Diagnosis not present

## 2019-10-15 DIAGNOSIS — M9901 Segmental and somatic dysfunction of cervical region: Secondary | ICD-10-CM | POA: Diagnosis not present

## 2019-10-17 DIAGNOSIS — M9901 Segmental and somatic dysfunction of cervical region: Secondary | ICD-10-CM | POA: Diagnosis not present

## 2019-10-17 DIAGNOSIS — M5414 Radiculopathy, thoracic region: Secondary | ICD-10-CM | POA: Diagnosis not present

## 2019-10-17 DIAGNOSIS — M9902 Segmental and somatic dysfunction of thoracic region: Secondary | ICD-10-CM | POA: Diagnosis not present

## 2019-10-17 DIAGNOSIS — M5032 Other cervical disc degeneration, mid-cervical region, unspecified level: Secondary | ICD-10-CM | POA: Diagnosis not present

## 2019-10-17 DIAGNOSIS — M5386 Other specified dorsopathies, lumbar region: Secondary | ICD-10-CM | POA: Diagnosis not present

## 2019-10-17 DIAGNOSIS — M9903 Segmental and somatic dysfunction of lumbar region: Secondary | ICD-10-CM | POA: Diagnosis not present

## 2019-10-22 DIAGNOSIS — M5386 Other specified dorsopathies, lumbar region: Secondary | ICD-10-CM | POA: Diagnosis not present

## 2019-10-22 DIAGNOSIS — M5032 Other cervical disc degeneration, mid-cervical region, unspecified level: Secondary | ICD-10-CM | POA: Diagnosis not present

## 2019-10-22 DIAGNOSIS — M9903 Segmental and somatic dysfunction of lumbar region: Secondary | ICD-10-CM | POA: Diagnosis not present

## 2019-10-22 DIAGNOSIS — M5414 Radiculopathy, thoracic region: Secondary | ICD-10-CM | POA: Diagnosis not present

## 2019-10-22 DIAGNOSIS — M9901 Segmental and somatic dysfunction of cervical region: Secondary | ICD-10-CM | POA: Diagnosis not present

## 2019-10-22 DIAGNOSIS — M9902 Segmental and somatic dysfunction of thoracic region: Secondary | ICD-10-CM | POA: Diagnosis not present

## 2019-10-24 ENCOUNTER — Other Ambulatory Visit: Payer: Self-pay | Admitting: Internal Medicine

## 2019-10-24 DIAGNOSIS — M9901 Segmental and somatic dysfunction of cervical region: Secondary | ICD-10-CM | POA: Diagnosis not present

## 2019-10-24 DIAGNOSIS — M9903 Segmental and somatic dysfunction of lumbar region: Secondary | ICD-10-CM | POA: Diagnosis not present

## 2019-10-24 DIAGNOSIS — M5386 Other specified dorsopathies, lumbar region: Secondary | ICD-10-CM | POA: Diagnosis not present

## 2019-10-24 DIAGNOSIS — M5414 Radiculopathy, thoracic region: Secondary | ICD-10-CM | POA: Diagnosis not present

## 2019-10-24 DIAGNOSIS — M5032 Other cervical disc degeneration, mid-cervical region, unspecified level: Secondary | ICD-10-CM | POA: Diagnosis not present

## 2019-10-24 DIAGNOSIS — M9902 Segmental and somatic dysfunction of thoracic region: Secondary | ICD-10-CM | POA: Diagnosis not present

## 2019-10-28 DIAGNOSIS — M5032 Other cervical disc degeneration, mid-cervical region, unspecified level: Secondary | ICD-10-CM | POA: Diagnosis not present

## 2019-10-28 DIAGNOSIS — M9902 Segmental and somatic dysfunction of thoracic region: Secondary | ICD-10-CM | POA: Diagnosis not present

## 2019-10-28 DIAGNOSIS — M9903 Segmental and somatic dysfunction of lumbar region: Secondary | ICD-10-CM | POA: Diagnosis not present

## 2019-10-28 DIAGNOSIS — M9901 Segmental and somatic dysfunction of cervical region: Secondary | ICD-10-CM | POA: Diagnosis not present

## 2019-10-28 DIAGNOSIS — M5386 Other specified dorsopathies, lumbar region: Secondary | ICD-10-CM | POA: Diagnosis not present

## 2019-10-28 DIAGNOSIS — M5414 Radiculopathy, thoracic region: Secondary | ICD-10-CM | POA: Diagnosis not present

## 2019-10-31 DIAGNOSIS — M5032 Other cervical disc degeneration, mid-cervical region, unspecified level: Secondary | ICD-10-CM | POA: Diagnosis not present

## 2019-10-31 DIAGNOSIS — M5386 Other specified dorsopathies, lumbar region: Secondary | ICD-10-CM | POA: Diagnosis not present

## 2019-10-31 DIAGNOSIS — M9901 Segmental and somatic dysfunction of cervical region: Secondary | ICD-10-CM | POA: Diagnosis not present

## 2019-10-31 DIAGNOSIS — M9903 Segmental and somatic dysfunction of lumbar region: Secondary | ICD-10-CM | POA: Diagnosis not present

## 2019-10-31 DIAGNOSIS — M5414 Radiculopathy, thoracic region: Secondary | ICD-10-CM | POA: Diagnosis not present

## 2019-10-31 DIAGNOSIS — M9902 Segmental and somatic dysfunction of thoracic region: Secondary | ICD-10-CM | POA: Diagnosis not present

## 2019-11-12 DIAGNOSIS — M5386 Other specified dorsopathies, lumbar region: Secondary | ICD-10-CM | POA: Diagnosis not present

## 2019-11-12 DIAGNOSIS — M9903 Segmental and somatic dysfunction of lumbar region: Secondary | ICD-10-CM | POA: Diagnosis not present

## 2019-11-12 DIAGNOSIS — M5414 Radiculopathy, thoracic region: Secondary | ICD-10-CM | POA: Diagnosis not present

## 2019-11-12 DIAGNOSIS — M9902 Segmental and somatic dysfunction of thoracic region: Secondary | ICD-10-CM | POA: Diagnosis not present

## 2019-11-12 DIAGNOSIS — M5032 Other cervical disc degeneration, mid-cervical region, unspecified level: Secondary | ICD-10-CM | POA: Diagnosis not present

## 2019-11-12 DIAGNOSIS — M9901 Segmental and somatic dysfunction of cervical region: Secondary | ICD-10-CM | POA: Diagnosis not present

## 2019-11-18 ENCOUNTER — Other Ambulatory Visit: Payer: Medicare Other | Admitting: Internal Medicine

## 2019-11-18 ENCOUNTER — Other Ambulatory Visit: Payer: Self-pay

## 2019-11-18 DIAGNOSIS — N1831 Chronic kidney disease, stage 3a: Secondary | ICD-10-CM

## 2019-11-18 DIAGNOSIS — E782 Mixed hyperlipidemia: Secondary | ICD-10-CM | POA: Diagnosis not present

## 2019-11-18 DIAGNOSIS — J309 Allergic rhinitis, unspecified: Secondary | ICD-10-CM | POA: Diagnosis not present

## 2019-11-18 DIAGNOSIS — R5383 Other fatigue: Secondary | ICD-10-CM | POA: Diagnosis not present

## 2019-11-18 DIAGNOSIS — M1990 Unspecified osteoarthritis, unspecified site: Secondary | ICD-10-CM | POA: Diagnosis not present

## 2019-11-18 DIAGNOSIS — Z0000 Encounter for general adult medical examination without abnormal findings: Secondary | ICD-10-CM | POA: Diagnosis not present

## 2019-11-18 DIAGNOSIS — N3941 Urge incontinence: Secondary | ICD-10-CM

## 2019-11-18 DIAGNOSIS — R7302 Impaired glucose tolerance (oral): Secondary | ICD-10-CM | POA: Diagnosis not present

## 2019-11-18 DIAGNOSIS — I10 Essential (primary) hypertension: Secondary | ICD-10-CM

## 2019-11-20 ENCOUNTER — Ambulatory Visit (INDEPENDENT_AMBULATORY_CARE_PROVIDER_SITE_OTHER): Payer: Medicare Other | Admitting: Internal Medicine

## 2019-11-20 ENCOUNTER — Other Ambulatory Visit: Payer: Self-pay

## 2019-11-20 ENCOUNTER — Encounter: Payer: Self-pay | Admitting: Internal Medicine

## 2019-11-20 VITALS — BP 130/80 | HR 64 | Wt 186.0 lb

## 2019-11-20 DIAGNOSIS — Z1211 Encounter for screening for malignant neoplasm of colon: Secondary | ICD-10-CM | POA: Diagnosis not present

## 2019-11-20 DIAGNOSIS — M9901 Segmental and somatic dysfunction of cervical region: Secondary | ICD-10-CM | POA: Diagnosis not present

## 2019-11-20 DIAGNOSIS — M9902 Segmental and somatic dysfunction of thoracic region: Secondary | ICD-10-CM | POA: Diagnosis not present

## 2019-11-20 DIAGNOSIS — Z0000 Encounter for general adult medical examination without abnormal findings: Secondary | ICD-10-CM

## 2019-11-20 DIAGNOSIS — N3941 Urge incontinence: Secondary | ICD-10-CM

## 2019-11-20 DIAGNOSIS — N1831 Chronic kidney disease, stage 3a: Secondary | ICD-10-CM

## 2019-11-20 DIAGNOSIS — R7302 Impaired glucose tolerance (oral): Secondary | ICD-10-CM

## 2019-11-20 DIAGNOSIS — E611 Iron deficiency: Secondary | ICD-10-CM

## 2019-11-20 DIAGNOSIS — I10 Essential (primary) hypertension: Secondary | ICD-10-CM

## 2019-11-20 DIAGNOSIS — D649 Anemia, unspecified: Secondary | ICD-10-CM

## 2019-11-20 DIAGNOSIS — M5386 Other specified dorsopathies, lumbar region: Secondary | ICD-10-CM | POA: Diagnosis not present

## 2019-11-20 DIAGNOSIS — R5383 Other fatigue: Secondary | ICD-10-CM

## 2019-11-20 DIAGNOSIS — M9903 Segmental and somatic dysfunction of lumbar region: Secondary | ICD-10-CM | POA: Diagnosis not present

## 2019-11-20 DIAGNOSIS — M5414 Radiculopathy, thoracic region: Secondary | ICD-10-CM | POA: Diagnosis not present

## 2019-11-20 DIAGNOSIS — M5432 Sciatica, left side: Secondary | ICD-10-CM

## 2019-11-20 DIAGNOSIS — M1990 Unspecified osteoarthritis, unspecified site: Secondary | ICD-10-CM

## 2019-11-20 DIAGNOSIS — M5032 Other cervical disc degeneration, mid-cervical region, unspecified level: Secondary | ICD-10-CM | POA: Diagnosis not present

## 2019-11-20 DIAGNOSIS — Z6834 Body mass index (BMI) 34.0-34.9, adult: Secondary | ICD-10-CM

## 2019-11-20 LAB — POCT URINALYSIS DIPSTICK
Appearance: NEGATIVE
Bilirubin, UA: NEGATIVE
Blood, UA: NEGATIVE
Glucose, UA: NEGATIVE
Ketones, UA: NEGATIVE
Leukocytes, UA: NEGATIVE
Nitrite, UA: NEGATIVE
Odor: NEGATIVE
Protein, UA: NEGATIVE
Spec Grav, UA: 1.015 (ref 1.010–1.025)
Urobilinogen, UA: 0.2 E.U./dL
pH, UA: 6.5 (ref 5.0–8.0)

## 2019-11-20 NOTE — Progress Notes (Signed)
Subjective:    Patient ID: Kelly [name], female    DOB: 11-30-1945, 74 y.o.   MRN: [id number]  HPI 73 year old Female seen for Medicare wellness, health maintenance, and evaluation of medical issues.Had colonoscopy and endoscopy by Dr. Henrene Pastor for anemia and diarrhea in 2012. Colon biopsies showed no colitis. Patient agrees to Cologard screening this year. Prefers not to have colonoscopy.  Creatinine elevated at 1.5 and was 1.24 last year c/w  CKD.  Stage IIIa  She has iron deficiency.  Hemoglobin is 11.4 g.  Total iron is low at 38.  Ferritin is low at 9.  White blood cell count and platelet count are normal.  Hemoglobin A1c is normal at 5.5%.  Lipid panel is normal except for low HDL of 34.  Continues to have intermittent issues with diarrhea.  No blood in stool.  History of hyperlipidemia maintained on generic Crestor.  History of essential hypertension and impaired glucose tolerance.  History of elevated serum creatinine likely due to years of hypertension.  She has had multiple plastic surgery procedures in 2002 and 2003 including abdominoplasty, liposuction, breast reduction surgery, bilateral brachioplasty.  Had facial cosmetic surgery with laser peel in 2018.  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 Fredna Dow in 2011.  Remote history of iron deficiency anemia which resolved after stopping anti-inflammatory medication.  Colonoscopy 2012 done by Dr. Henrene Pastor was normal.  Total left knee arthroplasty 2013.  Right knee arthroscopic surgery 2007.  Cholecystectomy 1965.  Hysterectomy without oophorectomy at age 89.  Right knee arthroplasty by Dr. Ninfa Linden in 2016.  Social history: She was raised in an orphanage in [place].  Patient's daughter died of metastatic ovarian cancer.  Patient previously worked for the [employer] and has had several jobs since that time.  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  She is divorced.  She drinks 2 glasses of wine daily.  Does not smoke.  Family history: Father died of alcoholism.  Mother died of heart disease.  1 sister with history of lung cancer.  3 other sisters in good health.        Review of Systems  Constitutional: Negative.   HENT: Negative.   Respiratory: Negative.   Cardiovascular: Negative.   Gastrointestinal:       Issues with diarrhea-?  Irritable bowel  Genitourinary: Negative.   Musculoskeletal:       Left-sided sciatica  Neurological: Negative for dizziness, syncope and weakness.  Psychiatric/Behavioral: Negative.        Objective:   Physical Exam Blood pressure 130/80, pulse 64, pulse oximetry 97% weight 186 pounds BMI 35.14  Skin warm and dry.  Nodes none.  Neck is supple.  No JVD thyromegaly or carotid bruits.  Chest is clear to auscultation.  Abdomen soft nondistended without hepatosplenomegaly masses or tenderness.  Bimanual is normal.  No lower extremity pitting edema.  Neuro intact without focal deficits.  Affect thought and judgment are normal.       Assessment & Plan:  Pleasant 74 year old Female with iron deficiency.  Advise Cologuard test for colonoscopy.  She does not want to have another colonoscopy at this point in time if not necessary.  Cologuard will be ordered  Issues with left-sided sciatica after a fall from a stool.  Essential hypertension-stable on amlodipine  Chronic kidney disease likely as a result of hypertension and use of  nonsteroidal anti-inflammatory medications.  Creatinine has increased slightly but still stable.  Hyperlipidemia stable on statin medication  History of anxiety but no longer taking Xanax  BMI 35.14  History of urge urinary incontinence-has been seen by [facility] in 2020.  Pelvic PT was recommended.  Plan: Cologuard test has been ordered.  Continue to watch diet and try to lose weight.  She has lost 7 pounds since  October 2020.  She is going to need iron supplementation.  We will need to follow-up on iron deficiency in December.  She will continue other medications as previously prescribed.  Subjective:   Patient presents for Medicare Annual/Subsequent preventive examination.   Risk Factors  Current exercise habits: Light exercise Dietary issues discussed: Low-fat low carbohydrate discussed  Cardiac risk factors: Hyperlipidemia  Depression Screen  (Note: if answer to either of the following is "Yes", a more complete depression screening is indicated)   Over the past two weeks, have you felt down, depressed or hopeless? No  Over the past two weeks, have you felt little interest or pleasure in doing things? No Have you lost interest or pleasure in daily life? No Do you often feel hopeless? No Do you cry easily over simple problems? No   Activities of Daily Living  In your present state of health, do you have any difficulty performing the following activities?:   Driving? No  Managing money? No  Feeding yourself? No  Getting from bed to chair? No  Climbing a flight of stairs? No  Preparing food and eating?: No  Bathing or showering? No  Getting dressed: No  Getting to the toilet? No  Using the toilet:No  Moving around from place to place: No  In the past year have you fallen or had a near fall?:No  Are you sexually active? No  Do you have more than one partner? No   Hearing Difficulties: No  Do you often ask people to speak up or repeat themselves? No  Do you experience ringing or noises in your ears? No  Do you have difficulty understanding soft or whispered voices? No  Do you feel that you have a problem with memory?  Sometimes Do you often misplace items? No    Home Safety:  Do you have a smoke alarm at your residence? Yes Do you have grab bars in the bathroom?  Yes Do you have throw rugs in your house?  Yes   Cognitive Testing  Alert? Yes Normal Appearance?Yes  Oriented  to person? Yes Place? Yes  Time? Yes  Recall of three objects? Yes  Can perform simple calculations? Yes  Displays appropriate judgment?Yes  Can read the correct time from a watch face?Yes   List the Names of Other Physician/Practitioners you currently use:  See referral list for the physicians patient is currently seeing.     Review of Systems: See above   Objective:     General appearance: Appears stated age and mildly obese  Head: Normocephalic, without obvious abnormality, atraumatic  Eyes: conj clear, EOMi PEERLA  Ears: normal TM's and external ear canals both ears  Nose: Nares normal. Septum midline. Mucosa normal. No drainage or sinus tenderness.  Throat: lips, mucosa, and tongue normal; teeth and gums normal  Neck: no adenopathy, no carotid bruit, no JVD, supple, symmetrical, trachea midline and thyroid not enlarged, symmetric, no tenderness/mass/nodules  No CVA tenderness.  Lungs: clear to auscultation bilaterally  Breasts: normal appearance, no masses or tenderness Heart: regular rate and rhythm, S1, S2  normal, no murmur, click, rub or gallop  Abdomen: soft, non-tender; bowel sounds normal; no masses, no organomegaly  Musculoskeletal: ROM normal in all joints, no crepitus, no deformity, Normal muscle strengthen. Back  is symmetric, no curvature. Skin: Skin color, texture, turgor normal. No rashes or lesions  Lymph nodes: Cervical, supraclavicular, and axillary nodes normal.  Neurologic: CN 2 -12 Normal, Normal symmetric reflexes. Normal coordination and gait  Psych: Alert & Oriented x 3, Mood appear stable.    Assessment:    Annual wellness medicare exam   Plan:    During the course of the visit the patient was educated and counseled about appropriate screening and preventive services including:   Had mammogram July 2021  Has had 2 Covid vaccines  Needs flu vaccine     Patient Instructions (the written plan) was given to the patient.  Medicare  Attestation  I have personally reviewed:  The patient's medical and social history  Their use of alcohol, tobacco or illicit drugs  Their current medications and supplements  The patient's functional ability including ADLs,fall risks, home safety risks, cognitive, and hearing and visual impairment  Diet and physical activities  Evidence for depression or mood disorders  The patient's weight, height, BMI, and visual acuity have been recorded in the chart. I have made referrals, counseling, and provided education to the patient based on review of the above and I have provided the patient with a written personalized care plan for preventive services.

## 2019-11-22 LAB — TEST AUTHORIZATION

## 2019-11-22 LAB — RETICULOCYTES
ABS Retic: 46970 cells/uL (ref 20000–8000)
Retic Ct Pct: 1.1 %

## 2019-11-22 LAB — CBC WITH DIFFERENTIAL/PLATELET
Absolute Monocytes: 304 cells/uL (ref 200–950)
Basophils Absolute: 12 cells/uL (ref 0–200)
Basophils Relative: 0.3 %
Eosinophils Absolute: 52 cells/uL (ref 15–500)
Eosinophils Relative: 1.3 %
HCT: 35.6 % (ref 35.0–45.0)
Hemoglobin: 11.4 g/dL — ABNORMAL LOW (ref 11.7–15.5)
Lymphs Abs: 1464 cells/uL (ref 850–3900)
MCH: 26.8 pg — ABNORMAL LOW (ref 27.0–33.0)
MCHC: 32 g/dL (ref 32.0–36.0)
MCV: 83.6 fL (ref 80.0–100.0)
MPV: 11.7 fL (ref 7.5–12.5)
Monocytes Relative: 7.6 %
Neutro Abs: 2168 cells/uL (ref 1500–7800)
Neutrophils Relative %: 54.2 %
Platelets: 211 10*3/uL (ref 140–400)
RBC: 4.26 10*6/uL (ref 3.80–5.10)
RDW: 14 % (ref 11.0–15.0)
Total Lymphocyte: 36.6 %
WBC: 4 10*3/uL (ref 3.8–10.8)

## 2019-11-22 LAB — LIPID PANEL
Cholesterol: 154 mg/dL (ref <200)
HDL: 34 mg/dL — ABNORMAL LOW (ref >=50)
LDL Cholesterol (Calc): 96 mg/dL (calc)
Non-HDL Cholesterol (Calc): 120 mg/dL (calc) (ref <130)
Total CHOL/HDL Ratio: 4.5 (calc) (ref <5.0)
Triglycerides: 140 mg/dL (ref <150)

## 2019-11-22 LAB — COMPLETE METABOLIC PANEL WITH GFR
AG Ratio: 1.6 (calc) (ref 1.0–2.5)
ALT: 13 U/L (ref 6–29)
AST: 20 U/L (ref 10–35)
Albumin: 4 g/dL (ref 3.6–5.1)
Alkaline phosphatase (APISO): 98 U/L (ref 37–153)
BUN/Creatinine Ratio: 16 (calc) (ref 6–22)
BUN: 24 mg/dL (ref 7–25)
CO2: 29 mmol/L (ref 20–32)
Calcium: 10.2 mg/dL (ref 8.6–10.4)
Chloride: 105 mmol/L (ref 98–110)
Creat: 1.5 mg/dL — ABNORMAL HIGH (ref 0.60–0.93)
GFR, Est African American: 39 mL/min/{1.73_m2} — ABNORMAL LOW (ref >=60)
GFR, Est Non African American: 34 mL/min/{1.73_m2} — ABNORMAL LOW (ref >=60)
Globulin: 2.5 g/dL (calc) (ref 1.9–3.7)
Glucose, Bld: 90 mg/dL (ref 65–99)
Potassium: 4.5 mmol/L (ref 3.5–5.3)
Sodium: 141 mmol/L (ref 135–146)
Total Bilirubin: 0.6 mg/dL (ref 0.2–1.2)
Total Protein: 6.5 g/dL (ref 6.1–8.1)

## 2019-11-22 LAB — VITAMIN B12: Vitamin B-12: >2000 pg/mL — ABNORMAL HIGH (ref 200–1100)

## 2019-11-22 LAB — IRON,TIBC AND FERRITIN PANEL
%SAT: 9 % (calc) — ABNORMAL LOW (ref 16–45)
Ferritin: 9 ng/mL — ABNORMAL LOW (ref 16–288)
Iron: 38 ug/dL — ABNORMAL LOW (ref 45–160)
TIBC: 411 mcg/dL (calc) (ref 250–450)

## 2019-11-22 LAB — MICROALBUMIN / CREATININE URINE RATIO
Creatinine, Urine: 60 mg/dL (ref 20–275)
Microalb Creat Ratio: 22 mcg/mg creat (ref <30)
Microalb, Ur: 1.3 mg/dL

## 2019-11-22 LAB — HEMOGLOBIN A1C W/OUT EAG: Hgb A1c MFr Bld: 5.5 % of total Hgb (ref <5.7)

## 2019-11-22 LAB — TSH: TSH: 3.04 mIU/L (ref 0.40–4.50)

## 2019-11-22 LAB — FOLATE: Folate: 5.1 ng/mL — ABNORMAL LOW

## 2019-12-03 ENCOUNTER — Encounter: Payer: Self-pay | Admitting: Internal Medicine

## 2019-12-03 DIAGNOSIS — Z1211 Encounter for screening for malignant neoplasm of colon: Secondary | ICD-10-CM | POA: Diagnosis not present

## 2019-12-12 ENCOUNTER — Telehealth: Payer: Self-pay | Admitting: Orthopaedic Surgery

## 2019-12-12 LAB — COLOGUARD: Cologuard: POSITIVE — AB

## 2019-12-12 NOTE — Telephone Encounter (Signed)
Patient called requesting a referral to be seen by Dr. Ernestina Patches for her back. Patient phone number is [phone]. Patient is asking for a call back.

## 2019-12-12 NOTE — Telephone Encounter (Signed)
She needs to be seen, she hasn't been seen since 2019 and that was for her knee

## 2019-12-13 ENCOUNTER — Encounter: Payer: Self-pay | Admitting: Internal Medicine

## 2019-12-13 ENCOUNTER — Telehealth: Payer: Self-pay | Admitting: Internal Medicine

## 2019-12-13 DIAGNOSIS — E669 Obesity, unspecified: Secondary | ICD-10-CM | POA: Insufficient documentation

## 2019-12-13 DIAGNOSIS — E1169 Type 2 diabetes mellitus with other specified complication: Secondary | ICD-10-CM | POA: Insufficient documentation

## 2019-12-13 NOTE — Telephone Encounter (Signed)
Phone call to patient regarding Cologard test results. It is positive. She will be referred to Dr. Henrene Pastor who saw her in 2012 for colonoscopy. She is having some issues with radiculopathy after falling off of a stool and will be seeing Dr. Ninfa Linden, Orthopedist soon.

## 2019-12-13 NOTE — Patient Instructions (Signed)
Please take iron supplement over-the-counter and we need to follow-up with this in 6 weeks.  Cologuard ordered.  Continue other medications as previously prescribed.  It was a pleasure to see you today.  Have third Covid vaccine when available.

## 2019-12-17 ENCOUNTER — Ambulatory Visit (INDEPENDENT_AMBULATORY_CARE_PROVIDER_SITE_OTHER): Payer: Medicare Other

## 2019-12-17 ENCOUNTER — Ambulatory Visit (INDEPENDENT_AMBULATORY_CARE_PROVIDER_SITE_OTHER): Payer: Medicare Other | Admitting: Orthopaedic Surgery

## 2019-12-17 ENCOUNTER — Other Ambulatory Visit: Payer: Self-pay

## 2019-12-17 DIAGNOSIS — G8929 Other chronic pain: Secondary | ICD-10-CM

## 2019-12-17 DIAGNOSIS — M5442 Lumbago with sciatica, left side: Secondary | ICD-10-CM

## 2019-12-17 DIAGNOSIS — M4807 Spinal stenosis, lumbosacral region: Secondary | ICD-10-CM

## 2019-12-17 MED ORDER — METHYLPREDNISOLONE 4 MG PO TABS: ORAL_TABLET | ORAL | 0 refills | Status: DC

## 2019-12-17 NOTE — Progress Notes (Signed)
Office Visit Note   Patient: Kelly [name]           Date of Birth: 02/22/1945           MRN: [id number] Visit Date: 12/17/2019              Requested by: Elby Showers, MD [address] [place],  [place] [address] PCP: Elby Showers, MD   Assessment & Plan: Visit Diagnoses:  1. Chronic left-sided low back pain with left-sided sciatica     Plan: At this point given the failure of conservative treatment including therapy through her chiropractor for the last 3 months, a MRI is warranted to rule out nerve compression.  I am concerned about her back based on her plain films and her clinical exam.  I will start a 6-day steroid taper in the interim I will see her back after the MRI.  All question concerns were answered and addressed.  Follow-Up Instructions: Return in about 2 weeks (around 12/31/2019).   Orders:  Orders Placed This Encounter  Procedures  . XR Lumbar Spine 2-3 Views   Meds ordered this encounter  Medications  . methylPREDNISolone (MEDROL) 4 MG tablet    Sig: Medrol dose pack. Take as instructed    Dispense:  21 tablet    Refill:  0      Procedures: No procedures performed   Clinical Data: No additional findings.   Subjective: Chief Complaint  Patient presents with  . Lower Back - Pain  The patient is well-known to me.  She comes in for evaluation treatment of left-sided low back pain and worsening sciatica.  This is been going on for 3 months and getting slowly worse.  She is actually been through multiple therapy treatments with a chiropractor and the chiropractor has recommended her seeing [place] since she is not getting better.  She does not describe any specific injury.  She said no change in bowel or bladder function.  She says the pain radiates down the back of her hip and thigh down to just past her knee.  She is not a diabetic.  She denies any weakness in her legs.  HPI  Review of Systems She currently denies any headache, chest pain,  shortness of breath, fever, chills, nausea, vomiting  Objective: Vital Signs: There were no vitals taken for this visit.  Physical Exam She is alert and orient x3 and in no acute distress Ortho Exam On examination of her lower extremities, she has a positive straight leg raise on the left side and not on the right.  She has good strength in the bilateral lower extremities but decreased sensation along the lateral aspect of her right leg when comparing the right and left legs.  She has pain with flexion extension of the lumbar spine.  Her left and right hip exams are normal.  She has pain to palpation over the ischium and the SI joint on the left side. Specialty Comments:  No specialty comments available.  Imaging: XR Lumbar Spine 2-3 Views  Result Date: 12/17/2019 2 views of the lumbar spine shows significant degenerative changes throughout the lumbar spine with a degenerative scoliosis as well.  There is severe stenosis.    PMFS History: Patient Active Problem List   Diagnosis Date Noted  . Chronic left-sided low back pain with left-sided sciatica 12/17/2019  . Diabetes mellitus type 2 in obese ([facility]) 12/13/2019  . Chronic rhinitis 03/03/2019  . Constipation 10/31/2016  . Impaired glucose tolerance  10/31/2016  . Stage III chronic kidney disease ([facility]) 10/31/2016  . Urge urinary incontinence 10/31/2016  . Status post right partial knee replacement 10/02/2014  . Anxiety and depression 11/06/2011  . Hypertension 10/13/2010  . Hyperlipidemia 10/13/2010  . Osteoarthritis 10/13/2010  . UNSPECIFIED IRON DEFICIENCY ANEMIA 04/11/2010  . DIARRHEA 04/11/2010   Past Medical History:  Diagnosis Date  . Depression   . Hyperlipidemia   . Hypertension     Family History  Problem Relation Age of Onset  . Heart disease Mother   . Alcohol abuse Father   . Hypertension Father   . Cancer Sister   . Hypertension Sister   . Breast cancer Neg Hx     Past Surgical History:  Procedure  Laterality Date  . ABDOMINAL HYSTERECTOMY    . BREAST REDUCTION SURGERY     and lift  . BREAST REDUCTION SURGERY Bilateral   . CARPAL TUNNEL RELEASE  05/11   right  . CHOLECYSTECTOMY    . FACIAL COSMETIC SURGERY  2/10  . KNEE ARTHROSCOPY  2007   left & right  . LIPOSUCTION EXTREMITIES     thighs  . PARTIAL KNEE ARTHROPLASTY Right 10/02/2014   Procedure: RIGHT KNEE MEDIAL UNICOMPARTMENTAL ARTHROPLASTY;  Surgeon: Mcarthur Rossetti, MD;  Location: [facility];  Service: Orthopedics;  Laterality: Right;  . REDUCTION MAMMAPLASTY     bilateral  . TOTAL KNEE REVISION  10/12/2011   Procedure: TOTAL KNEE REVISION;  Surgeon: Mcarthur Rossetti, MD;  Location: [facility];  Service: Orthopedics;  Laterality: Left;  Left Total Knee Revision Arthroplasty  . TRIGGER FINGER RELEASE  5/11   right 4th finger   Social History   Occupational History  . Not on file  Tobacco Use  . Smoking status: Never Smoker  . Smokeless tobacco: Never Used  Vaping Use  . Vaping Use: Never used  Substance and Sexual Activity  . Alcohol use: Yes    Alcohol/week: 14.0 standard drinks    Types: 14 Glasses of wine per week    Comment: daily 1- 2  . Drug use: No  . Sexual activity: Not on file

## 2019-12-31 ENCOUNTER — Ambulatory Visit: Payer: Medicare Other | Admitting: Orthopaedic Surgery

## 2020-01-01 ENCOUNTER — Encounter: Payer: Self-pay | Admitting: Internal Medicine

## 2020-01-05 ENCOUNTER — Ambulatory Visit
Admission: RE | Admit: 2020-01-05 | Discharge: 2020-01-05 | Disposition: A | Discharge status: Home / Self Care | Payer: Medicare Other | Source: Ambulatory Visit | Attending: Orthopaedic Surgery | Admitting: Orthopaedic Surgery

## 2020-01-05 ENCOUNTER — Other Ambulatory Visit: Payer: Self-pay

## 2020-01-05 DIAGNOSIS — M545 Low back pain, unspecified: Secondary | ICD-10-CM | POA: Diagnosis not present

## 2020-01-05 DIAGNOSIS — M4807 Spinal stenosis, lumbosacral region: Secondary | ICD-10-CM

## 2020-01-05 DIAGNOSIS — M48061 Spinal stenosis, lumbar region without neurogenic claudication: Secondary | ICD-10-CM | POA: Diagnosis not present

## 2020-01-07 ENCOUNTER — Other Ambulatory Visit: Payer: Self-pay

## 2020-01-07 ENCOUNTER — Ambulatory Visit (INDEPENDENT_AMBULATORY_CARE_PROVIDER_SITE_OTHER): Payer: Medicare Other | Admitting: Orthopaedic Surgery

## 2020-01-07 ENCOUNTER — Encounter: Payer: Self-pay | Admitting: Orthopaedic Surgery

## 2020-01-07 DIAGNOSIS — G8929 Other chronic pain: Secondary | ICD-10-CM

## 2020-01-07 DIAGNOSIS — M4807 Spinal stenosis, lumbosacral region: Secondary | ICD-10-CM | POA: Diagnosis not present

## 2020-01-07 DIAGNOSIS — M5442 Lumbago with sciatica, left side: Secondary | ICD-10-CM

## 2020-01-07 NOTE — Progress Notes (Signed)
The patient comes in today to go over an MRI of her lumbar spine.  She was having left-sided sciatica but also her right foot numbness.  She is well-known to me.  She is an active 74 year old female who used to swim a lot before the COVID-19 pandemic.  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  She walks without assistive device but does report that she is having some slight balance issues.  On exam she still has consistent with numbness she has on the lateral aspect of her right foot and the pain that she has on the lower lumbar spine to the right and left but also the sciatic symptoms going down the left side.  She has a positive straight leg raise to the left side.  There is no significant weakness in her legs.  The MRI of her lumbar spine is reviewed and shows foraminal stenosis at L3-L4 which is quite significant as well as foraminal stenosis to the right at L5-S1.  Both of these areas appear to be narrow enough they could affect the left L3 nerve root in the right L5 nerve root.  We will send her to Dr. Ernestina Patches for epidural steroid injections to the left at L3-L4 and to the right L5-S1.  He can then get her back to me a few weeks later.  I have talked her about the possibility physical therapy as well but she wants to wait until having the injections first given the holidays.  All questions and concerns were answered and addressed.

## 2020-01-15 ENCOUNTER — Other Ambulatory Visit: Payer: Self-pay

## 2020-01-15 ENCOUNTER — Other Ambulatory Visit: Payer: Medicare Other | Admitting: Internal Medicine

## 2020-01-15 DIAGNOSIS — D649 Anemia, unspecified: Secondary | ICD-10-CM | POA: Diagnosis not present

## 2020-01-15 DIAGNOSIS — M1990 Unspecified osteoarthritis, unspecified site: Secondary | ICD-10-CM | POA: Diagnosis not present

## 2020-01-15 DIAGNOSIS — E611 Iron deficiency: Secondary | ICD-10-CM | POA: Diagnosis not present

## 2020-01-16 LAB — CBC WITH DIFFERENTIAL/PLATELET
Absolute Monocytes: 299 cells/uL (ref 200–950)
Basophils Absolute: 18 cells/uL (ref 0–200)
Basophils Relative: 0.4 %
Eosinophils Absolute: 101 cells/uL (ref 15–500)
Eosinophils Relative: 2.2 %
HCT: 39.3 % (ref 35.0–45.0)
Hemoglobin: 12.7 g/dL (ref 11.7–15.5)
Lymphs Abs: 1799 cells/uL (ref 850–3900)
MCH: 27.6 pg (ref 27.0–33.0)
MCHC: 32.3 g/dL (ref 32.0–36.0)
MCV: 85.4 fL (ref 80.0–100.0)
MPV: 11.3 fL (ref 7.5–12.5)
Monocytes Relative: 6.5 %
Neutro Abs: 2383 cells/uL (ref 1500–7800)
Neutrophils Relative %: 51.8 %
Platelets: 195 10*3/uL (ref 140–400)
RBC: 4.6 10*6/uL (ref 3.80–5.10)
RDW: 16.1 % — ABNORMAL HIGH (ref 11.0–15.0)
Total Lymphocyte: 39.1 %
WBC: 4.6 10*3/uL (ref 3.8–10.8)

## 2020-01-16 LAB — IRON,TIBC AND FERRITIN PANEL
%SAT: 65 % (calc) — ABNORMAL HIGH (ref 16–45)
Ferritin: 14 ng/mL — ABNORMAL LOW (ref 16–288)
Iron: 249 ug/dL — ABNORMAL HIGH (ref 45–160)
TIBC: 381 mcg/dL (calc) (ref 250–450)

## 2020-01-22 ENCOUNTER — Encounter: Payer: Self-pay | Admitting: Internal Medicine

## 2020-01-22 ENCOUNTER — Other Ambulatory Visit: Payer: Self-pay | Admitting: Internal Medicine

## 2020-01-22 ENCOUNTER — Ambulatory Visit (INDEPENDENT_AMBULATORY_CARE_PROVIDER_SITE_OTHER): Payer: Medicare Other | Admitting: Internal Medicine

## 2020-01-22 ENCOUNTER — Other Ambulatory Visit: Payer: Self-pay

## 2020-01-22 VITALS — BP 120/80 | HR 76 | Ht 61.0 in | Wt 187.0 lb

## 2020-01-22 DIAGNOSIS — M1990 Unspecified osteoarthritis, unspecified site: Secondary | ICD-10-CM

## 2020-01-22 DIAGNOSIS — N1831 Chronic kidney disease, stage 3a: Secondary | ICD-10-CM

## 2020-01-22 DIAGNOSIS — I10 Essential (primary) hypertension: Secondary | ICD-10-CM | POA: Diagnosis not present

## 2020-01-22 DIAGNOSIS — E611 Iron deficiency: Secondary | ICD-10-CM | POA: Diagnosis not present

## 2020-01-22 NOTE — Patient Instructions (Signed)
Advised patient to return in 6 months or as needed.  GI consultation to be done in the near future for evaluation of iron deficiency anemia.

## 2020-01-22 NOTE — Progress Notes (Signed)
   Subjective:    Patient ID: Kelly [name], female    DOB: 04-Jul-1945, 74 y.o.   MRN: [id number]  HPI 74 year old Female seen for follow up of iron deficiency.  Reviewed with her today possible reasons for iron deficiency. Still taking iron supplement.  Ferritin has improved from 9 to 14. She needs to continue taking iron supplement.  Hemoglobin has improved from 11.4 g to 12.7 g. Serum iron has improved from 38 to 249.    Denies taking NSAIDs that would have caused blood loss anemia.  History of chronic kidney disease with creatinine in October  of 1.50  increased from September 2020 at 1.24.  We will need to follow-up on this in 6 months.  She has appointment to see GI physician later this month and will likely have colonoscopy and possibly endoscopy.  Her folate level was low at 5.1 and she was told to take folate 1 mg daily over-the-counter.  Folate was not checked with this visit.  This low level was likely nutritional.  Cologuard was ordered but she decided not to do that and opted for GI consult which I think is wise.  Recently saw Dr. Ninfa Linden and diagnosed with spinal stenosis of lumbosacral spine. Is to see Dr. Ernestina Patches for epidural steroid injections Left L3-L4 and right L5-S1. I have  advised patient to avoid heavy yard work.  Has not had third Covid vaccine and was advised to do so.  She would like to go back to work in the [place] perhaps as an at [employer]. Declines flu vaccine. Review of Systems- does not want flu vaccine or third Covid vaccine.  Hemoglobin checked on December 2 was normal at 12.7 g.  2 months ago, hemoglobin was 11.4 g.  MCV has been normal as has platelet count.     Objective:   Physical Exam  Blood pressure 120/80 pulse 76 pulse oximetry 97% weight 187 pounds BMI 35.33  Skin warm and dry.  Chest clear to auscultation.  Cardiac exam regular rate and rhythm.      Assessment & Plan:  On December 2, her iron level was noted to have improved  from 38 when checked 2 months ago to 249.  Ferritin improved from 9 to 14.  Hemoglobin improved from 11.4 g to 12.7 g. Has upcoming GI consult. Remote history of iron deficiency resolved after stopping NSAID and colonoscopy 2012 by Dr. Henrene Pastor was normal.Advised to watch wine consumption.   Lumbar Spinal stenosis- she is to have epidural steroid injections after the holidays.Avoid NSAIDS due to recent iron deficiency anemia.  Hx of diarrhea but no blood in stool recently.  CKD thought to be due to NSAID use for years.  Plan: I do agree with GI consultation to rule out GI reason for iron deficiency anemia.  Her iron level has improved with iron supplementation.  Occult GI malignancy needs to be ruled out.  She will be seeing gastroenterologist in the near future.  Advised patient to return in 6 months or as needed.

## 2020-01-28 ENCOUNTER — Ambulatory Visit (AMBULATORY_SURGERY_CENTER): Payer: Self-pay | Admitting: *Deleted

## 2020-01-28 ENCOUNTER — Other Ambulatory Visit: Payer: Self-pay

## 2020-01-28 VITALS — Ht 61.0 in | Wt 182.0 lb

## 2020-01-28 DIAGNOSIS — R195 Other fecal abnormalities: Secondary | ICD-10-CM

## 2020-01-28 MED ORDER — NA SULFATE-K SULFATE-MG SULF 17.5-3.13-1.6 GM/177ML PO SOLN: 1.0000 | Freq: Once | ORAL | 0 refills | Status: AC

## 2020-01-28 NOTE — Progress Notes (Signed)

## 2020-02-16 ENCOUNTER — Encounter: Payer: Self-pay | Admitting: Physical Medicine and Rehabilitation

## 2020-02-16 ENCOUNTER — Other Ambulatory Visit: Payer: Self-pay

## 2020-02-16 ENCOUNTER — Ambulatory Visit: Payer: Self-pay

## 2020-02-16 ENCOUNTER — Ambulatory Visit (INDEPENDENT_AMBULATORY_CARE_PROVIDER_SITE_OTHER): Payer: Medicare Other | Admitting: Physical Medicine and Rehabilitation

## 2020-02-16 VITALS — BP 133/84 | HR 65

## 2020-02-16 DIAGNOSIS — M48061 Spinal stenosis, lumbar region without neurogenic claudication: Secondary | ICD-10-CM

## 2020-02-16 DIAGNOSIS — M5416 Radiculopathy, lumbar region: Secondary | ICD-10-CM | POA: Diagnosis not present

## 2020-02-16 DIAGNOSIS — M419 Scoliosis, unspecified: Secondary | ICD-10-CM

## 2020-02-16 MED ORDER — DEXAMETHASONE SODIUM PHOSPHATE 10 MG/ML IJ SOLN
15.0000 mg | Freq: Once | INTRAMUSCULAR | Status: AC
  Administered 2020-02-16: 15 mg

## 2020-02-16 NOTE — Progress Notes (Signed)
Kelly [name] - 75 y.o. female MRN [id number]  Date of birth: 05-26-1945  Office Visit Note: Visit Date: 02/16/2020 PCP: Elby Showers, MD Referred by: Elby Showers, MD  Subjective: Chief Complaint  Patient presents with  . Left Knee - Pain  . Lower Back - Pain   HPI:  Kelly [name] is a 75 y.o. female who comes in today at the request of Dr. Jean Rosenthal for planned Left L3-L4 Lumbar epidural steroid injection with fluoroscopic guidance.  The patient has failed conservative care including home exercise, medications, time and activity modification.  This injection will be diagnostic and hopefully therapeutic.  Please see requesting physician notes for further details and justification.  MRI reviewed with images and spine model.  MRI reviewed in the note below.   ROS Otherwise per HPI.  Assessment & Plan: Visit Diagnoses:    ICD-10-CM   1. Lumbar radiculopathy  M54.16 XR C-ARM NO REPORT    Epidural Steroid injection    dexamethasone (DECADRON) injection 15 mg  2. Foraminal stenosis of lumbar region  M48.061   3. Scoliosis of lumbar spine, unspecified scoliosis type  M41.9     Plan: No additional findings.   Meds & Orders:  Meds ordered this encounter  Medications  . dexamethasone (DECADRON) injection 15 mg    Orders Placed This Encounter  Procedures  . XR C-ARM NO REPORT  . Epidural Steroid injection    Follow-up: Return if symptoms worsen or fail to improve.   Procedures: No procedures performed      Clinical History: MRI LUMBAR SPINE WITHOUT CONTRAST  TECHNIQUE: Multiplanar, multisequence MR imaging of the lumbar spine was performed. No intravenous contrast was administered.  COMPARISON:  Radiography 12/17/2019.  FINDINGS: Segmentation:  5 lumbar type vertebral bodies.  Alignment:  Curvature convex to the right with the apex at L2-3.  Vertebrae: Prominent discogenic edema within the left marrow at L3-4 endplates, finding that  can be correlated with regional back pain.  Conus medullaris and cauda equina: Conus extends to the T12-L1 level. Conus and cauda equina appear normal.  Paraspinal and other soft tissues: Incidental renal cysts.  Disc levels:  No significant finding from T10-11 through L1-2.  L2-3: Chronic disc degeneration with loss of height. Endplate osteophytes and mild bulging of the disc. Mild narrowing of the left lateral recess and intervertebral foramen on the left but without definite neural compression.  L3-4: Chronic disc degeneration with loss of height, more pronounced on the left. Endplate osteophytes and bulging of the disc. Superior endplate Schmorl's node at L4. Discogenic edema within the endplates as noted above, which could be correlated with back pain. Mild facet and ligamentous hypertrophy. Canal stenosis but without distinct neural compression in the canal. Left foraminal stenosis likely to affect the L3 nerve.  L4-5: Mild bulging of the disc. Mild facet and ligamentous hypertrophy. Mild stenosis of the lateral recesses and neural foramina but no visible neural compression.  L5-S1: Mild bulging of the disc, more prominent in the foramen on the right. Facet degeneration and hypertrophy right more than left. No central canal stenosis. Right foraminal narrowing with some potential to affect the exiting right L5 nerve.  IMPRESSION: 1. Spinal curvature convex to the right with the apex at L2-3. 2. Left foraminal stenosis at L3-4 due to encroachment by osteophyte and bulging disc likely to affect the L3 nerve. Discogenic edema of the endplates at [id number] which could be correlated with back pain. 3. Right foraminal narrowing at  L5-S1 due to encroachment by osteophyte and bulging disc that could possibly affect the right L5 nerve. 4. Lesser, grossly non-compressive degenerative changes at L2-3 and L4-5.   Electronically Signed   By: Paulina Fusi M.D.   On:  01/05/2020 08:18     Objective:  VS:  HT:    WT:   BMI:     BP:133/84  HR:65bpm  TEMP: ( )  RESP:  Physical Exam Constitutional:      General: She is not in acute distress.    Appearance: Normal appearance. She is not ill-appearing.  HENT:     Head: Normocephalic and atraumatic.     Right Ear: External ear normal.     Left Ear: External ear normal.  Eyes:     Extraocular Movements: Extraocular movements intact.  Cardiovascular:     Rate and Rhythm: Normal rate.     Pulses: Normal pulses.  Musculoskeletal:     Right lower leg: No edema.     Left lower leg: No edema.     Comments: Patient has good distal strength with no pain over the greater trochanters.  No clonus or focal weakness.  Skin:    Findings: No erythema, lesion or rash.  Neurological:     General: No focal deficit present.     Mental Status: She is alert and oriented to person, place, and time.     Sensory: No sensory deficit.     Motor: No weakness or abnormal muscle tone.     Coordination: Coordination normal.  Psychiatric:        Mood and Affect: Mood normal.        Behavior: Behavior normal.      Imaging: No results found.

## 2020-02-16 NOTE — Progress Notes (Signed)
Pt state left buttocks pain that travels down to her left knee. Pt state walking and house and chores makes the pain worse. Pt state she excise to help ease the pain.  Numeric Pain Rating Scale and Functional Assessment Average Pain 4   In the last MONTH (on 0-10 scale) has pain interfered with the following?  1. General activity like being  able to carry out your everyday physical activities such as walking, climbing stairs, carrying groceries, or moving a chair?  Rating(8)   +Driver, -BT, -Dye Allergies.

## 2020-02-18 ENCOUNTER — Encounter: Payer: Medicare Other | Admitting: Internal Medicine

## 2020-02-27 ENCOUNTER — Ambulatory Visit (AMBULATORY_SURGERY_CENTER): Payer: Medicare Other | Admitting: Internal Medicine

## 2020-02-27 ENCOUNTER — Other Ambulatory Visit: Payer: Self-pay

## 2020-02-27 ENCOUNTER — Encounter: Payer: Self-pay | Admitting: Internal Medicine

## 2020-02-27 VITALS — BP 130/78 | HR 63 | Temp 97.1°F | Resp 19 | Ht 61.0 in | Wt 182.0 lb

## 2020-02-27 DIAGNOSIS — K6389 Other specified diseases of intestine: Secondary | ICD-10-CM | POA: Diagnosis not present

## 2020-02-27 DIAGNOSIS — D124 Benign neoplasm of descending colon: Secondary | ICD-10-CM | POA: Diagnosis not present

## 2020-02-27 DIAGNOSIS — D125 Benign neoplasm of sigmoid colon: Secondary | ICD-10-CM

## 2020-02-27 DIAGNOSIS — R195 Other fecal abnormalities: Secondary | ICD-10-CM | POA: Diagnosis not present

## 2020-02-27 DIAGNOSIS — D122 Benign neoplasm of ascending colon: Secondary | ICD-10-CM

## 2020-02-27 DIAGNOSIS — K5730 Diverticulosis of large intestine without perforation or abscess without bleeding: Secondary | ICD-10-CM | POA: Diagnosis not present

## 2020-02-27 MED ORDER — SODIUM CHLORIDE 0.9 % IV SOLN: 500.0000 mL | Freq: Once | INTRAVENOUS | Status: DC

## 2020-02-27 NOTE — Progress Notes (Signed)
Called to room to assist during endoscopic procedure.  Patient ID and intended procedure confirmed with present staff. Received instructions for my participation in the procedure from the performing physician.  

## 2020-02-27 NOTE — Progress Notes (Signed)
Medical history reviewed with no changes noted. VS assessed by A.G 

## 2020-02-27 NOTE — Op Note (Signed)
[facility] Patient Name: Kelly [name] Procedure Date: 02/27/2020 3:38 PM MRN: [id number] Endoscopist: Docia Chuck. Henrene Pastor , MD Age: 75 Referring MD:  Date of Birth: [birth year] Gender: Female Account #: [id number] Procedure:                Colonoscopy with cold snare polypectomy x 8 Indications:              Positive Cologuard test Medicines:                Monitored Anesthesia Care Procedure:                Pre-Anesthesia Assessment:                           - Prior to the procedure, a History and Physical                            was performed, and patient medications and                            allergies were reviewed. The patient's tolerance of                            previous anesthesia was also reviewed. The risks                            and benefits of the procedure and the sedation                            options and risks were discussed with the patient.                            All questions were answered, and informed consent                            was obtained. Prior Anticoagulants: The patient has                            taken no previous anticoagulant or antiplatelet                            agents. ASA Grade Assessment: II - A patient with                            mild systemic disease. After reviewing the risks                            and benefits, the patient was deemed in                            satisfactory condition to undergo the procedure.                           After obtaining informed consent, the colonoscope  was passed under direct vision. Throughout the                            procedure, the patient's blood pressure, pulse, and                            oxygen saturations were monitored continuously. The                            Olympus CF-HQ190L (Serial# [id number]) Colonoscope was                            introduced through the anus and advanced to the the                             cecum, identified by appendiceal orifice and                            ileocecal valve. The ileocecal valve, appendiceal                            orifice, and rectum were photographed. The quality                            of the bowel preparation was excellent. The                            colonoscopy was performed without difficulty. The                            patient tolerated the procedure well. The bowel                            preparation used was SUPREP via split dose                            instruction. Scope In: 3:50:40 PM Scope Out: 4:32:24 PM Scope Withdrawal Time: 0 hours 23 minutes 50 seconds  Total Procedure Duration: 0 hours 41 minutes 44 seconds  Findings:                 Eight polyps were found in the sigmoid colon,                            descending colon and ascending colon. The polyps                            were 2 to 5 mm in size. These polyps were removed                            with a cold snare. Resection and retrieval were                            complete.  Diverticula were found in the sigmoid colon and                            ascending colon.                           An area of melanosis was found in the entire colon.                            Biopsies were taken with a cold forceps for                            histology.                           The exam was otherwise without abnormality on                            direct and retroflexion views. The colon was quite                            tortuous Complications:            No immediate complications. Estimated blood loss:                            None. Estimated Blood Loss:     Estimated blood loss: none. Impression:               - Eight 2 to 5 mm polyps in the sigmoid colon, in                            the descending colon and in the ascending colon,                            removed with a cold snare. Resected and retrieved.                            - Diverticulosis in the sigmoid colon and in the                            ascending colon.                           - Melanosis in the colon. Biopsied.                           - The examination was otherwise normal on direct                            and retroflexion views. Recommendation:           - Repeat colonoscopy date to be determined after                            pending pathology results are reviewed for  surveillance.                           - Patient has a contact number available for                            emergencies. The signs and symptoms of potential                            delayed complications were discussed with the                            patient. Return to normal activities tomorrow.                            Written discharge instructions were provided to the                            patient.                           - Resume previous diet.                           - Continue present medications.                           - Await pathology results. Docia Chuck. Henrene Pastor, MD 02/27/2020 4:44:02 PM This report has been signed electronically.

## 2020-02-27 NOTE — Progress Notes (Signed)
pt tolerated well. VSS. awake and to recovery. Report given to RN.  

## 2020-02-27 NOTE — Patient Instructions (Signed)
Please read handouts provided. Continue present medications. Await pathology results.   YOU HAD AN ENDOSCOPIC PROCEDURE TODAY AT THE [facility]:   Refer to the procedure report that was given to you for any specific questions about what was found during the examination.  If the procedure report does not answer your questions, please call your gastroenterologist to clarify.  If you requested that your care partner not be given the details of your procedure findings, then the procedure report has been included in a sealed envelope for you to review at your convenience later.  YOU SHOULD EXPECT: Some feelings of bloating in the abdomen. Passage of more gas than usual.  Walking can help get rid of the air that was put into your GI tract during the procedure and reduce the bloating. If you had a lower endoscopy (such as a colonoscopy or flexible sigmoidoscopy) you may notice spotting of blood in your stool or on the toilet paper. If you underwent a bowel prep for your procedure, you may not have a normal bowel movement for a few days.  Please Note:  You might notice some irritation and congestion in your nose or some drainage.  This is from the oxygen used during your procedure.  There is no need for concern and it should clear up in a day or so.  SYMPTOMS TO REPORT IMMEDIATELY:  Following lower endoscopy (colonoscopy or flexible sigmoidoscopy):  Excessive amounts of blood in the stool  Significant tenderness or worsening of abdominal pains  Swelling of the abdomen that is new, acute  Fever of 100F or higher   For urgent or emergent issues, a gastroenterologist can be reached at any hour by calling [phone]. Do not use MyChart messaging for urgent concerns.    DIET:  We do recommend a small meal at first, but then you may proceed to your regular diet.  Drink plenty of fluids but you should avoid alcoholic beverages for 24 hours.  ACTIVITY:  You should plan to take it easy  for the rest of today and you should NOT DRIVE or use heavy machinery until tomorrow (because of the sedation medicines used during the test).    FOLLOW UP: Our staff will call the number listed on your records 48-72 hours following your procedure to check on you and address any questions or concerns that you may have regarding the information given to you following your procedure. If we do not reach you, we will leave a message.  We will attempt to reach you two times.  During this call, we will ask if you have developed any symptoms of COVID 19. If you develop any symptoms (ie: fever, flu-like symptoms, shortness of breath, cough etc.) before then, please call [phone].  If you test positive for Covid 19 in the 2 weeks post procedure, please call and report this information to us.    If any biopsies were taken you will be contacted by phone or by letter within the next 1-3 weeks.  Please call us at [phone] if you have not heard about the biopsies in 3 weeks.    SIGNATURES/CONFIDENTIALITY: You and/or your care partner have signed paperwork which will be entered into your electronic medical record.  These signatures attest to the fact that that the information above on your After Visit Summary has been reviewed and is understood.  Full responsibility of the confidentiality of this discharge information lies with you and/or your care-partner.  

## 2020-03-02 ENCOUNTER — Telehealth: Payer: Self-pay

## 2020-03-02 NOTE — Telephone Encounter (Signed)
Attempted to reach pt. With follow-up call following endoscopic procedure 02/26/2020.  LM on pt. Voice mail to call if she has any questions or concerns. 

## 2020-03-04 ENCOUNTER — Encounter: Payer: Self-pay | Admitting: Internal Medicine

## 2020-03-11 DIAGNOSIS — L821 Other seborrheic keratosis: Secondary | ICD-10-CM | POA: Diagnosis not present

## 2020-03-11 DIAGNOSIS — L814 Other melanin hyperpigmentation: Secondary | ICD-10-CM | POA: Diagnosis not present

## 2020-03-11 DIAGNOSIS — L578 Other skin changes due to chronic exposure to nonionizing radiation: Secondary | ICD-10-CM | POA: Diagnosis not present

## 2020-03-11 DIAGNOSIS — L989 Disorder of the skin and subcutaneous tissue, unspecified: Secondary | ICD-10-CM | POA: Diagnosis not present

## 2020-03-11 DIAGNOSIS — L820 Inflamed seborrheic keratosis: Secondary | ICD-10-CM | POA: Diagnosis not present

## 2020-03-30 DIAGNOSIS — R3121 Asymptomatic microscopic hematuria: Secondary | ICD-10-CM | POA: Diagnosis not present

## 2020-03-30 DIAGNOSIS — R351 Nocturia: Secondary | ICD-10-CM | POA: Diagnosis not present

## 2020-05-17 ENCOUNTER — Other Ambulatory Visit: Payer: Self-pay

## 2020-05-17 ENCOUNTER — Other Ambulatory Visit: Payer: Medicare Other | Admitting: Internal Medicine

## 2020-05-17 DIAGNOSIS — I10 Essential (primary) hypertension: Secondary | ICD-10-CM | POA: Diagnosis not present

## 2020-05-17 DIAGNOSIS — E782 Mixed hyperlipidemia: Secondary | ICD-10-CM

## 2020-05-17 DIAGNOSIS — N1831 Chronic kidney disease, stage 3a: Secondary | ICD-10-CM | POA: Diagnosis not present

## 2020-05-17 DIAGNOSIS — R7302 Impaired glucose tolerance (oral): Secondary | ICD-10-CM

## 2020-05-17 DIAGNOSIS — R5383 Other fatigue: Secondary | ICD-10-CM | POA: Diagnosis not present

## 2020-05-20 ENCOUNTER — Other Ambulatory Visit: Payer: Self-pay

## 2020-05-20 ENCOUNTER — Ambulatory Visit (INDEPENDENT_AMBULATORY_CARE_PROVIDER_SITE_OTHER): Payer: Medicare Other | Admitting: Internal Medicine

## 2020-05-20 ENCOUNTER — Encounter: Payer: Self-pay | Admitting: Internal Medicine

## 2020-05-20 VITALS — BP 130/80 | HR 68 | Ht 61.0 in | Wt 193.0 lb

## 2020-05-20 DIAGNOSIS — R5383 Other fatigue: Secondary | ICD-10-CM | POA: Diagnosis not present

## 2020-05-20 DIAGNOSIS — Z780 Asymptomatic menopausal state: Secondary | ICD-10-CM | POA: Diagnosis not present

## 2020-05-20 NOTE — Progress Notes (Signed)
   Subjective:    Patient ID: Kelly [name], female    DOB: Aug 01, 1945, 75 y.o.   MRN: [id number]  HPI 75 year old Female with chronic kidney disease Stage 3a seen for follow-up.  Had colonoscopy in January showing 6 adenomatous polyps and melanosis coli.  She has had a positive Cologuard test.  Today she is complaining of extreme fatigue.  She does have a history of chronic kidney disease with recent c-Met showing creatinine 1.28 and previously was 1.50 in October 2021.  Has been worried about the diagnosis of chronic kidney disease and has been reading about it.  She knows to avoid NSAIDs.  She works as a Actuary with the elderly.  She resides alone.  History of spinal stenosis of the lumbar spine.  Had some epidural steroid injections.  Was found to be iron deficient in December.  Ferritin improved from 9-14 with iron supplementation.  Hemoglobin improved from 11.4 g to 12.7 g.  Serum iron improved from 38-49.  Her folate level was low at 5.1 and she was placed on folate daily.  This was thought to be nutritional.  Only eating 2 meals a day.  Occasional glass of wine.  Agrees to renal consultation for chronic kidney disease.  I think this would help her peace of mind to have a thorough evaluation.  Because of fatigue today we drew vitamin B12, folate ferritin, free T4 and TSH.  I think she should see a dietitian as well because some concerned about caloric intake with only 2 meals a day..  Total protein and albumin were normal in October 2021.  Weight is 193 pounds her BMI is 36.47.  Weight in December was 187 pounds and BMI 35.33.    Review of Systems no edema.  No shortness of breath.  Eats ice cream.     Objective:   Physical Exam Vital signs reviewed.  Weight 193 pounds.  BMI 36.47.  Skin: Warm and dry.  Nodes none.  No thyromegaly.  No carotid bruits.  Chest is clear to auscultation without rales or wheezing.  No lower extremity pitting edema.       Assessment & Plan:   Extreme fatigue-etiology unclear.  Has not lost weight by our scale since previous visit.  Have checked thyroid functions, folate, ferritin and vitamin B12.  Chronic kidney disease-she is alarmed by this.  I think it is stage IIIa.  Will refer her to [facility] for evaluation which I think would ease her mind.  She knows to avoid NSAIDs.  History of iron deficiency-colonoscopy done by Dr.  Wilburn Mylar adenomatous polyps and melanosis coli.  Have checked iron level today.  Plan: See above.  Try to take in a few more calories and eat maybe 3 small meals daily instead of just 2 meals.  Labs are drawn and pending.  Referral to [facility] regarding stage IIIa chronic kidney disease.

## 2020-05-20 NOTE — Patient Instructions (Addendum)
Referral to [facility] regarding stage IIIa chronic kidney disease.  Labs drawn for complaint of fatigue.

## 2020-05-22 LAB — FERRITIN: Ferritin: 18 ng/mL (ref 16–288)

## 2020-05-22 LAB — BASIC METABOLIC PANEL
BUN/Creatinine Ratio: 17 (calc) (ref 6–22)
BUN: 22 mg/dL (ref 7–25)
CO2: 31 mmol/L (ref 20–32)
Calcium: 10.4 mg/dL (ref 8.6–10.4)
Chloride: 104 mmol/L (ref 98–110)
Creat: 1.28 mg/dL — ABNORMAL HIGH (ref 0.60–0.93)
Glucose, Bld: 87 mg/dL (ref 65–99)
Potassium: 4.3 mmol/L (ref 3.5–5.3)
Sodium: 142 mmol/L (ref 135–146)

## 2020-05-22 LAB — TEST AUTHORIZATION

## 2020-05-22 LAB — CBC WITH DIFFERENTIAL/PLATELET
Absolute Monocytes: 368 cells/uL (ref 200–950)
Basophils Absolute: 10 cells/uL (ref 0–200)
Basophils Relative: 0.2 %
Eosinophils Absolute: 78 cells/uL (ref 15–500)
Eosinophils Relative: 1.6 %
HCT: 39.5 % (ref 35.0–45.0)
Hemoglobin: 12.8 g/dL (ref 11.7–15.5)
Lymphs Abs: 1926 cells/uL (ref 850–3900)
MCH: 28.5 pg (ref 27.0–33.0)
MCHC: 32.4 g/dL (ref 32.0–36.0)
MCV: 88 fL (ref 80.0–100.0)
MPV: 11.7 fL (ref 7.5–12.5)
Monocytes Relative: 7.5 %
Neutro Abs: 2519 cells/uL (ref 1500–7800)
Neutrophils Relative %: 51.4 %
Platelets: 187 10*3/uL (ref 140–400)
RBC: 4.49 10*6/uL (ref 3.80–5.10)
RDW: 14 % (ref 11.0–15.0)
Total Lymphocyte: 39.3 %
WBC: 4.9 10*3/uL (ref 3.8–10.8)

## 2020-05-22 LAB — LIPID PANEL
Cholesterol: 157 mg/dL (ref <200)
HDL: 35 mg/dL — ABNORMAL LOW (ref >=50)
LDL Cholesterol (Calc): 99 mg/dL (calc)
Non-HDL Cholesterol (Calc): 122 mg/dL (calc) (ref <130)
Total CHOL/HDL Ratio: 4.5 (calc) (ref <5.0)
Triglycerides: 136 mg/dL (ref <150)

## 2020-05-22 LAB — VITAMIN B12: Vitamin B-12: 1237 pg/mL — ABNORMAL HIGH (ref 200–1100)

## 2020-05-22 LAB — TSH: TSH: 2.49 mIU/L (ref 0.40–4.50)

## 2020-05-22 LAB — FOLATE: Folate: 12.5 ng/mL

## 2020-05-22 LAB — T4, FREE: Free T4: 1.2 ng/dL (ref 0.8–1.8)

## 2020-07-14 DIAGNOSIS — N390 Urinary tract infection, site not specified: Secondary | ICD-10-CM | POA: Diagnosis not present

## 2020-07-14 DIAGNOSIS — N2581 Secondary hyperparathyroidism of renal origin: Secondary | ICD-10-CM | POA: Diagnosis not present

## 2020-07-14 DIAGNOSIS — E785 Hyperlipidemia, unspecified: Secondary | ICD-10-CM | POA: Diagnosis not present

## 2020-07-14 DIAGNOSIS — I129 Hypertensive chronic kidney disease with stage 1 through stage 4 chronic kidney disease, or unspecified chronic kidney disease: Secondary | ICD-10-CM | POA: Diagnosis not present

## 2020-07-14 DIAGNOSIS — N1831 Chronic kidney disease, stage 3a: Secondary | ICD-10-CM | POA: Diagnosis not present

## 2020-07-14 DIAGNOSIS — D631 Anemia in chronic kidney disease: Secondary | ICD-10-CM | POA: Diagnosis not present

## 2020-07-15 ENCOUNTER — Other Ambulatory Visit: Payer: Self-pay | Admitting: Nephrology

## 2020-07-15 DIAGNOSIS — N1831 Chronic kidney disease, stage 3a: Secondary | ICD-10-CM

## 2020-07-16 DIAGNOSIS — H2513 Age-related nuclear cataract, bilateral: Secondary | ICD-10-CM | POA: Diagnosis not present

## 2020-07-16 DIAGNOSIS — H43391 Other vitreous opacities, right eye: Secondary | ICD-10-CM | POA: Diagnosis not present

## 2020-07-16 DIAGNOSIS — H25013 Cortical age-related cataract, bilateral: Secondary | ICD-10-CM | POA: Diagnosis not present

## 2020-07-19 DIAGNOSIS — N3281 Overactive bladder: Secondary | ICD-10-CM | POA: Diagnosis not present

## 2020-07-20 DIAGNOSIS — N3281 Overactive bladder: Secondary | ICD-10-CM | POA: Diagnosis not present

## 2020-07-27 ENCOUNTER — Ambulatory Visit
Admission: RE | Admit: 2020-07-27 | Discharge: 2020-07-27 | Disposition: A | Discharge status: Home / Self Care | Payer: Medicare Other | Source: Ambulatory Visit | Attending: Nephrology | Admitting: Nephrology

## 2020-07-27 DIAGNOSIS — N1831 Chronic kidney disease, stage 3a: Secondary | ICD-10-CM

## 2020-07-27 DIAGNOSIS — N189 Chronic kidney disease, unspecified: Secondary | ICD-10-CM | POA: Diagnosis not present

## 2020-07-27 DIAGNOSIS — N281 Cyst of kidney, acquired: Secondary | ICD-10-CM | POA: Diagnosis not present

## 2020-08-02 ENCOUNTER — Other Ambulatory Visit: Payer: Self-pay | Admitting: Internal Medicine

## 2020-08-02 DIAGNOSIS — Z1231 Encounter for screening mammogram for malignant neoplasm of breast: Secondary | ICD-10-CM

## 2020-08-05 DIAGNOSIS — R350 Frequency of micturition: Secondary | ICD-10-CM | POA: Diagnosis not present

## 2020-08-12 DIAGNOSIS — R350 Frequency of micturition: Secondary | ICD-10-CM | POA: Diagnosis not present

## 2020-08-19 DIAGNOSIS — R350 Frequency of micturition: Secondary | ICD-10-CM | POA: Diagnosis not present

## 2020-08-24 DIAGNOSIS — H2513 Age-related nuclear cataract, bilateral: Secondary | ICD-10-CM | POA: Diagnosis not present

## 2020-08-24 DIAGNOSIS — H2511 Age-related nuclear cataract, right eye: Secondary | ICD-10-CM | POA: Diagnosis not present

## 2020-08-24 DIAGNOSIS — H43391 Other vitreous opacities, right eye: Secondary | ICD-10-CM | POA: Diagnosis not present

## 2020-08-24 DIAGNOSIS — H25013 Cortical age-related cataract, bilateral: Secondary | ICD-10-CM | POA: Diagnosis not present

## 2020-08-26 DIAGNOSIS — R350 Frequency of micturition: Secondary | ICD-10-CM | POA: Diagnosis not present

## 2020-09-02 DIAGNOSIS — R350 Frequency of micturition: Secondary | ICD-10-CM | POA: Diagnosis not present

## 2020-09-10 ENCOUNTER — Ambulatory Visit (INDEPENDENT_AMBULATORY_CARE_PROVIDER_SITE_OTHER): Payer: Medicare Other | Admitting: Podiatry

## 2020-09-10 ENCOUNTER — Other Ambulatory Visit: Payer: Self-pay

## 2020-09-10 ENCOUNTER — Ambulatory Visit (INDEPENDENT_AMBULATORY_CARE_PROVIDER_SITE_OTHER): Payer: Medicare Other

## 2020-09-10 ENCOUNTER — Telehealth: Payer: Self-pay

## 2020-09-10 DIAGNOSIS — G5753 Tarsal tunnel syndrome, bilateral lower limbs: Secondary | ICD-10-CM

## 2020-09-10 DIAGNOSIS — M79671 Pain in right foot: Secondary | ICD-10-CM

## 2020-09-10 DIAGNOSIS — M792 Neuralgia and neuritis, unspecified: Secondary | ICD-10-CM | POA: Diagnosis not present

## 2020-09-10 DIAGNOSIS — M79672 Pain in left foot: Secondary | ICD-10-CM

## 2020-09-10 NOTE — Telephone Encounter (Signed)
Patient calling asking if she can have another injection States she did go to the foot doc about the pain she has to her feet and he told her it was coming from her back and suggested she get another injection She has cataracts Wednesday and was asking for [facility] before then, I told her I am sure that wasn't possible unfortunately

## 2020-09-13 NOTE — Telephone Encounter (Signed)
Left L3 TF 02/16/20. Ok to repeat if helped, same problem/side, and no new injury?

## 2020-09-13 NOTE — Telephone Encounter (Signed)
Patient reports that some of her symptoms are different and on the opposite side. Scheduled for OV.

## 2020-09-14 ENCOUNTER — Other Ambulatory Visit: Payer: Self-pay

## 2020-09-14 ENCOUNTER — Encounter: Payer: Self-pay | Admitting: Physical Medicine and Rehabilitation

## 2020-09-14 ENCOUNTER — Ambulatory Visit (INDEPENDENT_AMBULATORY_CARE_PROVIDER_SITE_OTHER): Payer: Medicare Other | Admitting: Physical Medicine and Rehabilitation

## 2020-09-14 VITALS — BP 147/93 | HR 91

## 2020-09-14 DIAGNOSIS — M5416 Radiculopathy, lumbar region: Secondary | ICD-10-CM

## 2020-09-14 DIAGNOSIS — R208 Other disturbances of skin sensation: Secondary | ICD-10-CM

## 2020-09-14 DIAGNOSIS — M47816 Spondylosis without myelopathy or radiculopathy, lumbar region: Secondary | ICD-10-CM | POA: Diagnosis not present

## 2020-09-14 DIAGNOSIS — M4726 Other spondylosis with radiculopathy, lumbar region: Secondary | ICD-10-CM

## 2020-09-14 DIAGNOSIS — M48061 Spinal stenosis, lumbar region without neurogenic claudication: Secondary | ICD-10-CM | POA: Diagnosis not present

## 2020-09-14 NOTE — Progress Notes (Signed)
Pt state lower back pain that travels down her right leg into her feet. Pt state she feels tingling in her right foot. Pt state sitting for a long time then trying to get up makes the pain worse. Pt state she uses heating and ice to help ease her pain. Pt state she doesn't take pain meds to help ease her pain.  Numeric Pain Rating Scale and Functional Assessment Average Pain 10 Pain Right Now 7 My pain is intermittent, sharp, burning, dull, stabbing, tingling, and aching Pain is worse with: walking, sitting, standing, and some activites Pain improves with: rest and heat/ice   In the last MONTH (on 0-10 scale) has pain interfered with the following?  1. General activity like being  able to carry out your everyday physical activities such as walking, climbing stairs, carrying groceries, or moving a chair?  Rating(7)  2. Relation with others like being able to carry out your usual social activities and roles such as  activities at home, at work and in your community. Rating(8)  3. Enjoyment of life such that you have  been bothered by emotional problems such as feeling anxious, depressed or irritable?  Rating(9)

## 2020-09-14 NOTE — Progress Notes (Signed)
Kelly [name] - 75 y.o. female MRN [id number]  Date of birth: 01/13/1946  Office Visit Note: Visit Date: 09/14/2020 PCP: Elby Showers, MD Referred by: Elby Showers, MD  Subjective: Chief Complaint  Patient presents with   Lower Back - Pain   Right Leg - Pain   Right Foot - Pain, Tingling   HPI: Kelly [name] is a 75 y.o. female who comes in today for chronic, worsening and severe right-sided lower back pain radiating to buttock, lateral knee, and down to dorsum of foot.  Patient reports pain increased approximately 2 weeks ago, describes as sharp and tingling sensation, rates 7 out of 10 at present.  Patient reports pain is exacerbated by prolonged sitting.  Patient reports some pain relief with stretching exercises at home, use of heat/ice and periods of rest.  Patient had left L3-L4 transforaminal epidural steroid injection in January, which she reports gave her significant and sustained relief of pain.  Patient's lumbar MRI from 2021 exhibits right foraminal narrowing at L5-S1 and multi-level facet hypertrophy, no high grade canal stenosis noted. Patient reports she recently had deep tissue massage which helped to temporarily ease her pain. Patient is requesting epidural steroid injection and would like to try in house physical therapy. Patient reports she also just enrolled in water aerobics classed at the [facility]. Patient denies focal weakness, numbness, and tingling. Patient denies recent trauma or falls.   Review of Systems  Musculoskeletal:  Positive for back pain.  Neurological:  Negative for tremors and focal weakness.  All other systems reviewed and are negative. Otherwise per HPI.  Assessment & Plan: Visit Diagnoses:    ICD-10-CM   1. Lumbar radiculopathy  M54.16 Ambulatory referral to Physical Therapy    2. Other spondylosis with radiculopathy, lumbar region  M47.26     3. Foraminal stenosis of lumbar region  M48.061 Ambulatory referral to Physical Therapy    4.  Dysesthesia  R20.8     5. Facet arthropathy, lumbar  M47.816        Plan: Findings:  Chronic, worsening and severe right sided lower back pain radiating to buttock, lateral knee and down to dorsum of foot in a fairly classic L5 distribution or dermatome.. Patient's imaging and exam are consistent with potential L5 radiculopathy. Patient's pain continues despite good conservative therapies. We believe the next step is to perform a diagnostic and hopefully therapeutic right L5-S1 transforaminal epidural steroid injection. We also placed referral for in house physical therapy. If patient does not have good success with this injection we could consider facet joint blocks. Patient encouraged to continue stretching and strengthening exercises at home and to attend water aerobics regularly. Patient is agreeable to plan of care and we are confident that we can get patient back in office quickly for injection. No red flag symptoms noted upon exam.    Meds & Orders: No orders of the defined types were placed in this encounter.   Orders Placed This Encounter  Procedures   Ambulatory referral to Physical Therapy     Follow-up: Return in about 1 week (around 09/21/2020) for Right L5-S1 transforaminal epidural steroid injection.   Procedures: No procedures performed      Clinical History: MRI LUMBAR SPINE WITHOUT CONTRAST   TECHNIQUE: Multiplanar, multisequence MR imaging of the lumbar spine was performed. No intravenous contrast was administered.   COMPARISON:  Radiography 12/17/2019.   FINDINGS: Segmentation:  5 lumbar type vertebral bodies.   Alignment:  Curvature convex to the  right with the apex at L2-3.   Vertebrae: Prominent discogenic edema within the left marrow at L3-4 endplates, finding that can be correlated with regional back pain.   Conus medullaris and cauda equina: Conus extends to the T12-L1 level. Conus and cauda equina appear normal.   Paraspinal and other soft tissues:  Incidental renal cysts.   Disc levels:   No significant finding from T10-11 through L1-2.   L2-3: Chronic disc degeneration with loss of height. Endplate osteophytes and mild bulging of the disc. Mild narrowing of the left lateral recess and intervertebral foramen on the left but without definite neural compression.   L3-4: Chronic disc degeneration with loss of height, more pronounced on the left. Endplate osteophytes and bulging of the disc. Superior endplate Schmorl's node at L4. Discogenic edema within the endplates as noted above, which could be correlated with back pain. Mild facet and ligamentous hypertrophy. Canal stenosis but without distinct neural compression in the canal. Left foraminal stenosis likely to affect the L3 nerve.   L4-5: Mild bulging of the disc. Mild facet and ligamentous hypertrophy. Mild stenosis of the lateral recesses and neural foramina but no visible neural compression.   L5-S1: Mild bulging of the disc, more prominent in the foramen on the right. Facet degeneration and hypertrophy right more than left. No central canal stenosis. Right foraminal narrowing with some potential to affect the exiting right L5 nerve.   IMPRESSION: 1. Spinal curvature convex to the right with the apex at L2-3. 2. Left foraminal stenosis at L3-4 due to encroachment by osteophyte and bulging disc likely to affect the L3 nerve. Discogenic edema of the endplates at [id number] which could be correlated with back pain. 3. Right foraminal narrowing at L5-S1 due to encroachment by osteophyte and bulging disc that could possibly affect the right L5 nerve. 4. Lesser, grossly non-compressive degenerative changes at L2-3 and L4-5.     Electronically Signed   By: Nelson Chimes M.D.   On: 01/05/2020 08:18   She reports that she has never smoked. She has never used smokeless tobacco.  Recent Labs    11/18/19 0954  HGBA1C 5.5    Objective:  VS:  HT:    WT:   BMI:     BP:(!)  147/93  HR:91bpm  TEMP: ( )  RESP:  Physical Exam HENT:     Head: Normocephalic.     Right Ear: Tympanic membrane normal.     Left Ear: Tympanic membrane normal.     Nose: Nose normal.     Mouth/Throat:     Mouth: Mucous membranes are moist.  Eyes:     Pupils: Pupils are equal, round, and reactive to light.  Cardiovascular:     Rate and Rhythm: Normal rate.     Pulses: Normal pulses.  Pulmonary:     Effort: Pulmonary effort is normal.  Abdominal:     General: Abdomen is flat. There is no distension.  Musculoskeletal:     Cervical back: Normal range of motion and neck supple.     Comments: Pt is slow to rise from seated position to standing. Good lumbar range of motion. Strong distal strength without clonus, no pain upon palpation of greater trochanters. Pain noted upon palpation of underlying musculature on right paraspinal region. Sensation intact bilaterally. Walks independently, gait steady. Dysesthesias noted to L5 dermatome. Positive slump test.   Skin:    General: Skin is warm and dry.     Capillary Refill: Capillary refill takes less than  2 seconds.  Neurological:     General: No focal deficit present.     Mental Status: She is alert.  Psychiatric:        Mood and Affect: Mood normal.    Ortho Exam  Imaging: No results found.  Past Medical/Family/Surgical/Social History: Medications & Allergies reviewed per EMR, new medications updated. 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/2014   Anxiety and depression 11/06/2011   Hypertension 10/13/2010   Hyperlipidemia 10/13/2010   Osteoarthritis  10/13/2010   UNSPECIFIED IRON DEFICIENCY ANEMIA 04/11/2010   DIARRHEA 04/11/2010   Past Medical History:  Diagnosis Date   Depression    Hyperlipidemia    Hypertension    Osteoporosis    Family History  Problem Relation Age of Onset   Heart disease Mother    Alcohol abuse Father    Hypertension Father    Cancer Sister    Hypertension Sister    Breast cancer Neg Hx    Colon cancer Neg Hx    Colon polyps Neg Hx    Esophageal cancer Neg Hx    Stomach cancer Neg Hx    Rectal cancer Neg Hx    Past Surgical History:  Procedure Laterality Date   ABDOMINAL HYSTERECTOMY     BREAST REDUCTION SURGERY     and lift   BREAST REDUCTION SURGERY Bilateral    CARPAL TUNNEL RELEASE  05/11   right   CHOLECYSTECTOMY     FACIAL COSMETIC SURGERY  2/10   KNEE ARTHROSCOPY  2007   left & right   LIPOSUCTION EXTREMITIES     thighs   PARTIAL KNEE ARTHROPLASTY Right 10/02/2014   Procedure: RIGHT KNEE MEDIAL UNICOMPARTMENTAL ARTHROPLASTY;  Surgeon: Mcarthur Rossetti, MD;  Location: [facility];  Service: Orthopedics;  Laterality: Right;   REDUCTION MAMMAPLASTY     bilateral   TOTAL KNEE REVISION  10/12/2011   Procedure: TOTAL KNEE REVISION;  Surgeon: Mcarthur Rossetti, MD;  Location: [facility];  Service: Orthopedics;  Laterality: Left;  Left Total Knee Revision Arthroplasty   TRIGGER FINGER RELEASE  5/11   right 4th finger   Social History   Occupational History   Not on file  Tobacco Use   Smoking status: Never   Smokeless tobacco: Never  Vaping Use   Vaping Use: Never used  Substance and Sexual Activity   Alcohol use: Yes    Alcohol/week: 14.0 standard drinks    Types: 14 Glasses of wine per week    Comment: daily 1- 2   Drug use: No   Sexual activity: Not on file

## 2020-09-15 DIAGNOSIS — H25811 Combined forms of age-related cataract, right eye: Secondary | ICD-10-CM | POA: Diagnosis not present

## 2020-09-15 DIAGNOSIS — H2511 Age-related nuclear cataract, right eye: Secondary | ICD-10-CM | POA: Diagnosis not present

## 2020-09-15 NOTE — Progress Notes (Signed)
Subjective:  Patient ID: Kelly [name], female    DOB: 13-Oct-1945,  MRN: [id number]  Chief Complaint  Patient presents with   Foot Pain    Bilateral foot pain  Numbness in toes  Pain with standing    75 y.o. female presents with the above complaint.  Patient presents with complaint of bilateral foot tenderness tingling and numbness to both feet.  Patient states is on the bottom of the foot pain with standing.  Patient has a history of degenerative lower back issues.  She has not seen anyone else prior to seeing me for both of the foot.  She has never had nerve conduction study done.  She wanted get evaluated make sure there is not just primarily lower extremity or is coming from somewhere else.  She states that it does hurt hurts with standing.  She denies any other acute complaints.   Review of Systems: Negative except as noted in the HPI. Denies N/V/F/Ch.  Past Medical History:  Diagnosis Date   Depression    Hyperlipidemia    Hypertension    Osteoporosis     Current Outpatient Medications:    amLODipine (NORVASC) 5 MG tablet, TAKE 1 TABLET BY MOUTH EVERY DAY, Disp: 90 tablet, Rfl: 3   cholecalciferol (VITAMIN D3) 25 MCG (1000 UNIT) tablet, Take 1,000 Units by mouth daily., Disp: , Rfl:    cyanocobalamin 1000 MCG tablet, Take 1,000 mcg by mouth daily., Disp: , Rfl:    Ferrous Sulfate (IRON PO), Take 200 mg by mouth daily., Disp: , Rfl:    omega-3 acid ethyl esters (LOVAZA) 1 g capsule, Take by mouth 2 (two) times daily., Disp: , Rfl:    rosuvastatin (CRESTOR) 10 MG tablet, TAKE 1 TABLET(10 MG) BY MOUTH DAILY, Disp: 90 tablet, Rfl: 3  Social History   Tobacco Use  Smoking Status Never  Smokeless Tobacco Never    No Known Allergies Objective:  There were no vitals filed for this visit. There is no height or weight on file to calculate BMI. Constitutional Well developed. Well nourished.  Vascular Dorsalis pedis pulses palpable bilaterally. Posterior tibial pulses  palpable bilaterally. Capillary refill normal to all digits.  No cyanosis or clubbing noted. Pedal hair growth normal.  Neurologic Normal speech. Oriented to person, place, and time. Negative Tinel's sign/Valleix sign at the tarsal tunnel bilaterally.  Symptoms of neuritis noted to both lower extremity right greater than left side  Dermatologic Nails well groomed and normal in appearance. No open wounds. No skin lesions.  Orthopedic: Manual muscle strength within normal limits.  No digital deformities noted.   Radiographs:3 views of skeletally mature adult bilateral foot: Pes cavus foot structure noted with osteoarthritic changes to the midfoot.  Osteoarthritic changes noted at first MPJ.  No other bony abnormalities identified. Assessment:   1. 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 Neuritis    Plan:  Patient was evaluated and treated and all questions answered.  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  I discussed with her in extensive detail that shoe gear modification along with positional changes can help with the numbness and tingling.  Given that this is very sporadic in nature and might be more positional change I believe this likely might be coming from the lower back.  I discussed with the patient that she should consider following with a  spine specialist to further assess.  She states understanding.  At this time if any foot and ankle issues arises that progressively gets worse come back and see me.  She states understanding.  No follow-ups on file.

## 2020-09-16 ENCOUNTER — Ambulatory Visit (INDEPENDENT_AMBULATORY_CARE_PROVIDER_SITE_OTHER): Payer: Medicare Other | Admitting: Physical Medicine and Rehabilitation

## 2020-09-16 ENCOUNTER — Ambulatory Visit: Payer: Self-pay

## 2020-09-16 ENCOUNTER — Encounter: Payer: Self-pay | Admitting: Physical Medicine and Rehabilitation

## 2020-09-16 ENCOUNTER — Other Ambulatory Visit: Payer: Self-pay

## 2020-09-16 VITALS — BP 133/86 | HR 72

## 2020-09-16 DIAGNOSIS — M5416 Radiculopathy, lumbar region: Secondary | ICD-10-CM

## 2020-09-16 DIAGNOSIS — R350 Frequency of micturition: Secondary | ICD-10-CM | POA: Diagnosis not present

## 2020-09-16 DIAGNOSIS — N3281 Overactive bladder: Secondary | ICD-10-CM | POA: Diagnosis not present

## 2020-09-16 DIAGNOSIS — R351 Nocturia: Secondary | ICD-10-CM | POA: Diagnosis not present

## 2020-09-16 MED ORDER — METHYLPREDNISOLONE ACETATE 80 MG/ML IJ SUSP
80.0000 mg | Freq: Once | INTRAMUSCULAR | Status: AC
  Administered 2020-09-16: 80 mg

## 2020-09-16 NOTE — Patient Instructions (Signed)

## 2020-09-16 NOTE — Progress Notes (Signed)
Here for planned right L5 TF. No changes since OV 8/2.  Numeric Pain Rating Scale and Functional Assessment Average Pain 10   In the last MONTH (on 0-10 scale) has pain interfered with the following?  1. General activity like being  able to carry out your everyday physical activities such as walking, climbing stairs, carrying groceries, or moving a chair?  Rating(7)   +Driver, -BT, -Dye Allergies.

## 2020-09-21 DIAGNOSIS — H2512 Age-related nuclear cataract, left eye: Secondary | ICD-10-CM | POA: Diagnosis not present

## 2020-09-21 DIAGNOSIS — H25012 Cortical age-related cataract, left eye: Secondary | ICD-10-CM | POA: Diagnosis not present

## 2020-09-23 DIAGNOSIS — R350 Frequency of micturition: Secondary | ICD-10-CM | POA: Diagnosis not present

## 2020-09-24 ENCOUNTER — Other Ambulatory Visit: Payer: Self-pay

## 2020-09-24 ENCOUNTER — Ambulatory Visit
Admission: RE | Admit: 2020-09-24 | Discharge: 2020-09-24 | Disposition: A | Discharge status: Home / Self Care | Payer: Medicare Other | Source: Ambulatory Visit | Attending: Internal Medicine | Admitting: Internal Medicine

## 2020-09-24 DIAGNOSIS — Z1231 Encounter for screening mammogram for malignant neoplasm of breast: Secondary | ICD-10-CM

## 2020-09-28 ENCOUNTER — Encounter: Payer: Self-pay | Admitting: Rehabilitative and Restorative Service Providers"

## 2020-09-28 ENCOUNTER — Ambulatory Visit (INDEPENDENT_AMBULATORY_CARE_PROVIDER_SITE_OTHER): Payer: Medicare Other | Admitting: Rehabilitative and Restorative Service Providers"

## 2020-09-28 ENCOUNTER — Other Ambulatory Visit: Payer: Self-pay

## 2020-09-28 DIAGNOSIS — R293 Abnormal posture: Secondary | ICD-10-CM

## 2020-09-28 DIAGNOSIS — R262 Difficulty in walking, not elsewhere classified: Secondary | ICD-10-CM

## 2020-09-28 DIAGNOSIS — M5441 Lumbago with sciatica, right side: Secondary | ICD-10-CM | POA: Diagnosis not present

## 2020-09-28 DIAGNOSIS — G8929 Other chronic pain: Secondary | ICD-10-CM

## 2020-09-28 NOTE — Therapy (Addendum)
[facility] [address] [place], [place], [address] Phone: [phone]   Fax:  [phone]  Physical Therapy Evaluation  Patient Details  Name: Kelly [name] MRN: [id number] Date of Birth: 20-Jul-1945 Referring Provider (PT): Magnus Sinning, MD   Encounter Date: 09/28/2020   PT End of Session - 09/28/20 0837     Visit Number 1    Number of Visits 20    Date for PT Re-Evaluation 12/07/20    Authorization Type Medicare, BCBS - KX at 15    Progress Note Due on Visit 10    PT Start Time 0842    PT Stop Time 0920    PT Time Calculation (min) 38 min    Activity Tolerance Patient tolerated treatment well    Behavior During Therapy [facility] for tasks assessed/performed             Past Medical History:  Diagnosis Date   Depression    Hyperlipidemia    Hypertension    Osteoporosis     Past Surgical History:  Procedure Laterality Date   ABDOMINAL HYSTERECTOMY     BREAST REDUCTION SURGERY     and lift   BREAST REDUCTION SURGERY Bilateral    CARPAL TUNNEL RELEASE  05/11   right   CHOLECYSTECTOMY     FACIAL COSMETIC SURGERY  2/10   KNEE ARTHROSCOPY  2007   left & right   LIPOSUCTION EXTREMITIES     thighs   PARTIAL KNEE ARTHROPLASTY Right 10/02/2014   Procedure: RIGHT KNEE MEDIAL UNICOMPARTMENTAL ARTHROPLASTY;  Surgeon: Mcarthur Rossetti, MD;  Location: [facility];  Service: Orthopedics;  Laterality: Right;   REDUCTION MAMMAPLASTY     bilateral   TOTAL KNEE REVISION  10/12/2011   Procedure: TOTAL KNEE REVISION;  Surgeon: Mcarthur Rossetti, MD;  Location: [facility];  Service: Orthopedics;  Laterality: Left;  Left Total Knee Revision Arthroplasty   TRIGGER FINGER RELEASE  5/11   right 4th finger    There were no vitals filed for this visit.    Subjective Assessment - 09/28/20 0841     Subjective Pt. comes to clinic c complaints of low back pain approx. 1 year onset.  Pt. indicated history of treatment from chiro without  improvement.  Pt. stated she ended up with MRI showing DDD in lumbar.  Pt. indicated previous epidural in Jan with great improvement.  Pt. stated recent onset approx. one month ago with return of some pain and feet tingling.  Pt. indicated chief complaint of back and Rt leg symptoms this time around with recent injection.  Pt. stated tingling in toes is still noted but nothing severe.  Pt. indicated she continued to perform exercise ([employer] joined a few weeks ago) and wants to join water aerobics.  Recent cataract surgeries.    Pertinent History Hx of scoliosis,   Injection performed 09/16/2020.  hyperlipidemia, depression, HTN, osteoporosis    Limitations Walking;Standing;House hold activities    Diagnostic tests MRI previously    Patient Stated Goals Reduce pain, get back to normal life/activity    Currently in Pain? Yes    Pain Score 5    at worst   Pain Location Back    Pain Orientation Right    Pain Descriptors / Indicators Tingling;Sharp    Pain Type Chronic pain    Pain Radiating Towards Rt leg , toe tingling on Rt    Pain Onset More than a month ago    Pain Frequency Intermittent    Aggravating  Factors  vacuum cleaner use, lifting at stores and in yard work, worse in evening    Pain Relieving Factors injection helped some                [facility] PT Assessment - 09/28/20 0001       Assessment   Medical Diagnosis M54.16 (ICD-10-CM) - Lumbar radiculopathy  M48.061 (ICD-10-CM) - Foraminal stenosis of lumbar region  M41.9 (ICD-10-CM) - Scoliosis of lumbar spine, unspecified scoliosis type    Referring Provider (PT) Magnus Sinning, MD    Onset Date/Surgical Date 08/13/20    Hand Dominance Left      Precautions   Precautions None      Balance Screen   Has the patient fallen in the past 6 months No    Has the patient had a decrease in activity level because of a fear of falling?  No    Is the patient reluctant to leave their home because of a fear of falling?  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    Leisure [employer], water aerobics, garden/yard work      Observation/Other Assessments   Focus on Therapeutic Outcomes (FOTO)  intake 67%, predicted 69%      Functional Tests   Functional tests Sit to Stand      Sit to Stand   Comments 18 inch c no UE with no complaints      Posture/Postural Control   Posture/Postural Control Postural limitations    Postural Limitations Forward head;Increased thoracic kyphosis;Flexed trunk    Posture Comments Concave thoracic to Rt      ROM / Strength   AROM / PROM / Strength Strength;PROM;AROM      AROM   AROM Assessment Site Lumbar;Hip    Lumbar Flexion movement to toes, no change in symptoms    Lumbar Extension 25% WFL c no change in symptoms    Lumbar - Right Side Bend movement to lateral knee joint c pulling on Lt lumbar    Lumbar - Left Side Bend movement to lateral knee c pulling/pain on Lt lumbar      Strength   Overall Strength Comments Myotomal check WFL    Strength Assessment Site Hip;Ankle;Knee    Right/Left Hip Left;Right    Right Hip Flexion 5/5    Left Hip Flexion 5/5    Right/Left Knee Right;Left    Right Knee Flexion 5/5    Right Knee Extension 5/5    Left Knee Flexion 5/5    Left Knee Extension 5/5    Right/Left Ankle Right;Left    Right Ankle Dorsiflexion 5/5    Left Ankle Dorsiflexion 5/5      Palpation   SI assessment  Check in future visits      Special Tests   Other special tests (+) slump on Rt for toe tingling noted, (-) passive SLR, crossed SLR bilateral                        Objective measurements completed on examination: See above findings.       [facility] Adult PT Treatment/Exercise - 09/28/20 0001        Exercises  Exercises Other Exercises;Lumbar    Other Exercises  HEP instruction/performance c cues for techniques, handout provided.  Trial set performed of each for comprehension and symptom assessment.  HEP instruction included standing lumbar extension, supine bridge, supine LTR stretch, supine sciatic nerve flossing.  See handout/Pt. instructions                    PT Education - 09/28/20 0837     Education Details HEP, POC    Person(s) Educated Patient    Methods Explanation;Demonstration;Verbal cues;Handout    Comprehension Returned demonstration;Verbalized understanding              PT Short Term Goals - 09/28/20 0837       PT SHORT TERM GOAL #1   Title Patient will demonstrate independent use of home exercise program to maintain progress from in clinic treatments.    Time 3    Period Weeks    Status New    Target Date 10/19/20               PT Long Term Goals - 09/28/20 0838       PT LONG TERM GOAL #1   Title Patient will demonstrate/report pain at worst less than or equal to 2/10 to facilitate minimal limitation in daily activity secondary to pain symptoms.    Time 10    Period Weeks    Status New    Target Date 12/07/20      PT LONG TERM GOAL #2   Title Patient will demonstrate independent use of home exercise program to facilitate ability to maintain/progress functional gains from skilled physical therapy services.    Time 10    Period Weeks    Status New    Target Date 12/07/20      PT LONG TERM GOAL #3   Title Pt. will demonstrate FOTO outcome > or = 69% to indicated reduced disability due to condition.    Time 10    Period Weeks    Status New    Target Date 12/07/20      PT LONG TERM GOAL #4   Title Patient will demonstrate lumbar extension 100 % WFL s symptoms to facilitate upright standing, walking posture at PLOF s limitation.    Time 10    Period Weeks    Status New    Target Date 12/07/20      PT LONG TERM GOAL  #5   Title Pt. will demonstrate/report ability to perform usual yard work at [employer] s limitation.    Time 10    Period Weeks    Status New    Target Date 12/07/20                    Plan - 09/28/20 0839     Clinical Impression Statement Patient is a 75 y.o. who comes to clinic with complaints of low back pain with mobility, strength and movement coordination deficits that impair their ability to perform usual daily and recreational functional activities without increase difficulty/symptoms at this time.  Patient to benefit from skilled PT services to address impairments and limitations to improve to previous level of function without restriction secondary to condition.    Personal Factors and Comorbidities Comorbidity 3+    Comorbidities hyperlipidemia, depression, HTN, osteoporosis    Examination-Activity Limitations Bend;Squat;Caring for Others;Carry;Reach Overhead;Lift;Locomotion Level    Examination-Participation Restrictions Yard Work;Interpersonal Relationship;Community Activity;Volunteer;Laundry;Shop;Cleaning    Stability/Clinical Decision Making Stable/Uncomplicated    Clinical Decision Making  Low    Rehab Potential --   fair to good   PT Frequency 2x / week    PT Duration Other (comment)   10 weeks   PT Treatment/Interventions ADLs/Self Care Home Management;Cryotherapy;Electrical Stimulation;Iontophoresis '4mg'$ /ml Dexamethasone;Moist Heat;Balance training;Therapeutic exercise;Therapeutic activities;Functional mobility training;Stair training;Gait training;DME Instruction;Ultrasound;Neuromuscular re-education;Patient/family education;Passive range of motion;Dry needling;Taping;Manual techniques;Traction    PT Next Visit Plan Check lumbar mobility, improve hip strength, review lifting movements    PT Home Exercise Plan [email or web address]    Consulted and Agree with Plan of Care Patient             Patient will benefit from skilled therapeutic intervention in order to improve the  following deficits and impairments:  Pain, Increased muscle spasms, Decreased range of motion, Impaired perceived functional ability, Improper body mechanics, Postural dysfunction, Impaired flexibility, Decreased coordination, Decreased endurance, Hypomobility, Decreased activity tolerance, Decreased mobility, Difficulty walking  Visit Diagnosis: Chronic bilateral low back pain with right-sided sciatica  Difficulty in walking, not elsewhere classified  Abnormal posture     Problem List Patient Active Problem List   Diagnosis Date Noted   Chronic left-sided low back pain with left-sided sciatica 12/17/2019   Diabetes mellitus type 2 in obese ([place]) 12/13/2019   Chronic rhinitis 03/03/2019   Constipation 10/31/2016   Impaired glucose tolerance 10/31/2016   Stage III chronic kidney disease ([place]) 10/31/2016   Urge urinary incontinence 10/31/2016   Status post right partial knee replacement 10/02/2014   Anxiety and depression 11/06/2011   Hypertension 10/13/2010   Hyperlipidemia 10/13/2010   Osteoarthritis 10/13/2010   UNSPECIFIED IRON DEFICIENCY ANEMIA 04/11/2010   DIARRHEA 04/11/2010    Scot Jun, PT, DPT, OCS, ATC 09/28/20  9:26 AM    [facility] [address] [place], [place], [address] Phone: [phone]   Fax:  [phone]  Name: Kelly [name] MRN: [id number] Date of Birth: August 10, 1945

## 2020-09-28 NOTE — Patient Instructions (Signed)
Access Code: [email or web address] URL: [email or web address] Date: 09/28/2020 Prepared by: Scot Jun  Exercises Supine Lower Trunk Rotation - 2 x daily - 7 x weekly - 1 sets - 5 reps - 15 hold Supine Bridge - 2 x daily - 7 x weekly - 3 sets - 10 reps - 2 hold Supine 90/90 Sciatic Nerve Glide with Knee Flexion/Extension - 2 x daily - 7 x weekly - 3 sets - 10 reps Standing Lumbar Extension at Wall - Forearms - 2 x daily - 7 x weekly - 1-2 sets - 5-10 reps

## 2020-09-29 DIAGNOSIS — H2512 Age-related nuclear cataract, left eye: Secondary | ICD-10-CM | POA: Diagnosis not present

## 2020-09-29 DIAGNOSIS — H25812 Combined forms of age-related cataract, left eye: Secondary | ICD-10-CM | POA: Diagnosis not present

## 2020-09-29 DIAGNOSIS — H25012 Cortical age-related cataract, left eye: Secondary | ICD-10-CM | POA: Diagnosis not present

## 2020-09-30 DIAGNOSIS — R350 Frequency of micturition: Secondary | ICD-10-CM | POA: Diagnosis not present

## 2020-10-05 ENCOUNTER — Ambulatory Visit (INDEPENDENT_AMBULATORY_CARE_PROVIDER_SITE_OTHER): Payer: Medicare Other | Admitting: Physical Therapy

## 2020-10-05 ENCOUNTER — Encounter: Payer: Self-pay | Admitting: Physical Therapy

## 2020-10-05 ENCOUNTER — Other Ambulatory Visit: Payer: Self-pay

## 2020-10-05 DIAGNOSIS — M5441 Lumbago with sciatica, right side: Secondary | ICD-10-CM | POA: Diagnosis not present

## 2020-10-05 DIAGNOSIS — R262 Difficulty in walking, not elsewhere classified: Secondary | ICD-10-CM | POA: Diagnosis not present

## 2020-10-05 DIAGNOSIS — G8929 Other chronic pain: Secondary | ICD-10-CM | POA: Diagnosis not present

## 2020-10-05 DIAGNOSIS — R293 Abnormal posture: Secondary | ICD-10-CM | POA: Diagnosis not present

## 2020-10-05 NOTE — Patient Instructions (Signed)
Access Code: [email or web address] URL: [email or web address] Date: 10/05/2020 Prepared by: Daleen Bo  Exercises Supine Lower Trunk Rotation - 2 x daily - 7 x weekly - 1 sets - 5 reps - 15 hold Supine Bridge - 2 x daily - 7 x weekly - 3 sets - 10 reps - 2 hold Supine 90/90 Sciatic Nerve Glide with Knee Flexion/Extension - 2 x daily - 7 x weekly - 3 sets - 10 reps Standing Lumbar Extension at Wall - Forearms - 2 x daily - 7 x weekly - 1-2 sets - 5-10 reps

## 2020-10-05 NOTE — Therapy (Signed)
[facility] [address] [place], [place], [address] Phone: [phone]   Fax:  [phone]  Physical Therapy Treatment  Patient Details  Name: Kelly [name] MRN: [id number] Date of Birth: 06-02-1945 Referring Provider (PT): Magnus Sinning, MD   Encounter Date: 10/05/2020   PT End of Session - 10/05/20 0929     Visit Number 2    Number of Visits 20    Date for PT Re-Evaluation 12/07/20    Authorization Type Medicare, BCBS - KX at 15    Progress Note Due on Visit 10    PT Start Time 0930    PT Stop Time 1010    PT Time Calculation (min) 40 min    Activity Tolerance Patient tolerated treatment well    Behavior During Therapy [facility] for tasks assessed/performed             Past Medical History:  Diagnosis Date   Depression    Hyperlipidemia    Hypertension    Osteoporosis     Past Surgical History:  Procedure Laterality Date   ABDOMINAL HYSTERECTOMY     BREAST REDUCTION SURGERY     and lift   BREAST REDUCTION SURGERY Bilateral    CARPAL TUNNEL RELEASE  05/11   right   CHOLECYSTECTOMY     FACIAL COSMETIC SURGERY  2/10   KNEE ARTHROSCOPY  2007   left & right   LIPOSUCTION EXTREMITIES     thighs   PARTIAL KNEE ARTHROPLASTY Right 10/02/2014   Procedure: RIGHT KNEE MEDIAL UNICOMPARTMENTAL ARTHROPLASTY;  Surgeon: Mcarthur Rossetti, MD;  Location: [facility];  Service: Orthopedics;  Laterality: Right;   REDUCTION MAMMAPLASTY     bilateral   TOTAL KNEE REVISION  10/12/2011   Procedure: TOTAL KNEE REVISION;  Surgeon: Mcarthur Rossetti, MD;  Location: [facility];  Service: Orthopedics;  Laterality: Left;  Left Total Knee Revision Arthroplasty   TRIGGER FINGER RELEASE  5/11   right 4th finger    There were no vitals filed for this visit.   Subjective Assessment - 10/05/20 0929     Subjective Pt states she feels that the NT into the R toes went away after doing HEP. She states she is much better. She had eye surgery done on  Wed and had to deal with family health emergencies so mixed compliance with HEP.    Pertinent History Hx of scoliosis,   Injection performed 09/16/2020.  hyperlipidemia, depression, HTN, osteoporosis    Limitations Walking;Standing;House hold activities    Diagnostic tests MRI previously    Patient Stated Goals Reduce pain, get back to normal life/activity    Currently in Pain? No/denies    Pain Score 0-No pain    Pain Onset More than a month ago                  [facility] Adult PT Treatment/Exercise - 10/05/20 0001       Lumbar Exercises: Stretches   Lower Trunk Rotation Limitations 15x 3s    Other Lumbar Stretch Exercise sciatic n. glide supine 90/90      Lumbar Exercises: Standing   Other Standing Lumbar Exercises L/S ext at wall 15x 3s      Lumbar Exercises: Supine   Bridge Limitations 10xGTB at knees  2x10 no band          Manual therapy: bilat lumbar paraspinal STM, R L3-5 UPA grade III         PT Education - 10/05/20 [id number]  Education Details anatomy, exercise progression, muscle firing, HEP    Person(s) Educated Patient    Methods Demonstration;Explanation;Tactile cues;Verbal cues    Comprehension Verbalized understanding;Returned demonstration              PT Short Term Goals - 09/28/20 0837       PT SHORT TERM GOAL #1   Title Patient will demonstrate independent use of home exercise program to maintain progress from in clinic treatments.    Time 3    Period Weeks    Status New    Target Date 10/19/20               PT Long Term Goals - 09/28/20 0838       PT LONG TERM GOAL #1   Title Patient will demonstrate/report pain at worst less than or equal to 2/10 to facilitate minimal limitation in daily activity secondary to pain symptoms.    Time 10    Period Weeks    Status New    Target Date 12/07/20      PT LONG TERM GOAL #2   Title Patient will demonstrate independent use of home exercise program to facilitate ability to  maintain/progress functional gains from skilled physical therapy services.    Time 10    Period Weeks    Status New    Target Date 12/07/20      PT LONG TERM GOAL #3   Title Pt. will demonstrate FOTO outcome > or = __% to indicated reduced disability due to condition.    Time 10    Period Weeks    Status New    Target Date 12/07/20      PT LONG TERM GOAL #4   Title Patient will demonstrate lumbar extension 100 % WFL s symptoms to facilitate upright standing, walking posture at PLOF s limitation.    Time 10    Period Weeks    Status New    Target Date 12/07/20      PT LONG TERM GOAL #5   Title Pt. will demonstrate/report ability to perform usual yard work at [employer] s limitation.    Time 10    Period Weeks    Status New    Target Date 12/07/20                   Plan - 10/05/20 0930     Clinical Impression Statement Pt had improved lumbar ROM in SB and flexion following manual therapy and exercise. Pt required cuing for reducing trunk compensation and excessive during review of HEP, especially with lumbar extension and nerve gliding. Bridging progressed to include hip ABD co-contraction. Pt HEP not updated due to report of mixed compliance. Pt would benefit from continued skilled therapy in order to reach goals and maximize functional lumbopelvice strength and ROM for prevention of further functional decline.    Personal Factors and Comorbidities Comorbidity 3+    Comorbidities hyperlipidemia, depression, HTN, osteoporosis    Examination-Activity Limitations Bend;Squat;Caring for Others;Carry;Reach Overhead;Lift;Locomotion Level    Examination-Participation Restrictions Yard Work;Interpersonal Relationship;Community Activity;Volunteer;Laundry;Shop;Cleaning    Stability/Clinical Decision Making Stable/Uncomplicated    Rehab Potential --   fair to good   PT Frequency 2x / week    PT Duration Other (comment)   10 weeks   PT Treatment/Interventions ADLs/Self Care Home  Management;Cryotherapy;Electrical Stimulation;Iontophoresis '4mg'$ /ml Dexamethasone;Moist Heat;Balance training;Therapeutic exercise;Therapeutic activities;Functional mobility training;Stair training;Gait training;DME Instruction;Ultrasound;Neuromuscular re-education;Patient/family education;Passive range of motion;Dry needling;Taping;Manual techniques;Traction    PT Next Visit Plan Check lumbar mobility,  improve hip strength    PT Home Exercise Plan [email or web address]    Consulted and Agree with Plan of Care Patient             Patient will benefit from skilled therapeutic intervention in order to improve the following deficits and impairments:  Pain, Increased muscle spasms, Decreased range of motion, Impaired perceived functional ability, Improper body mechanics, Postural dysfunction, Impaired flexibility, Decreased coordination, Decreased endurance, Hypomobility, Decreased activity tolerance, Decreased mobility, Difficulty walking  Visit Diagnosis: Difficulty in walking, not elsewhere classified  Chronic bilateral low back pain with right-sided sciatica  Abnormal posture     Problem List Patient Active Problem List   Diagnosis Date Noted   Chronic left-sided low back pain with left-sided sciatica 12/17/2019   Diabetes mellitus type 2 in obese ([place]) 12/13/2019   Chronic rhinitis 03/03/2019   Constipation 10/31/2016   Impaired glucose tolerance 10/31/2016   Stage III chronic kidney disease ([place]) 10/31/2016   Urge urinary incontinence 10/31/2016   Status post right partial knee replacement 10/02/2014   Anxiety and depression 11/06/2011   Hypertension 10/13/2010   Hyperlipidemia 10/13/2010   Osteoarthritis 10/13/2010   UNSPECIFIED IRON DEFICIENCY ANEMIA 04/11/2010   DIARRHEA 04/11/2010   Daleen Bo PT, DPT 10/05/20 10:14 AM   [facility] [address] [place], [place], [address] Phone: [phone]   Fax:  [phone]  Name: Kelly [name] MRN: [id number] Date of Birth: 1945-09-10

## 2020-10-05 NOTE — Procedures (Signed)
Lumbosacral Transforaminal Epidural Steroid Injection - Sub-Pedicular Approach with Fluoroscopic Guidance  Patient: Kelly [name]      Date of Birth: 03-23-1945 MRN: [id number] PCP: Elby Showers, MD      Visit Date: 09/16/2020   Universal Protocol:    Date/Time: 09/16/2020  Consent Given By: the patient  Position: PRONE  Additional Comments: Vital signs were monitored before and after the procedure. Patient was prepped and draped in the usual sterile fashion. The correct patient, procedure, and site was verified.   Injection Procedure Details:   Procedure diagnoses: Lumbar radiculopathy [M54.16]    Meds Administered:  Meds ordered this encounter  Medications   methylPREDNISolone acetate (DEPO-MEDROL) injection 80 mg    Laterality: Right  Location/Site:  L5-S1  Needle:5.0 in., 22 ga.  Short bevel or Quincke spinal needle  Needle Placement: Transforaminal  Findings:    -Comments: Excellent flow of contrast along the nerve, nerve root and into the epidural space.  Procedure Details: After squaring off the end-plates to get a true AP view, the C-arm was positioned so that an oblique view of the foramen as noted above was visualized. The target area is just inferior to the "nose of the scotty dog" or sub pedicular. The soft tissues overlying this structure were infiltrated with 2-3 ml. of 1% Lidocaine without Epinephrine.  The spinal needle was inserted toward the target using a "trajectory" view along the fluoroscope beam.  Under AP and lateral visualization, the needle was advanced so it did not puncture dura and was located close the 6 O'Clock position of the pedical in AP tracterory. Biplanar projections were used to confirm position. Aspiration was confirmed to be negative for CSF and/or blood. A 1-2 ml. volume of Isovue-250 was injected and flow of contrast was noted at each level. Radiographs were obtained for documentation purposes.   After attaining the  desired flow of contrast documented above, a 0.5 to 1.0 ml test dose of 0.25% Marcaine was injected into each respective transforaminal space.  The patient was observed for 90 seconds post injection.  After no sensory deficits were reported, and normal lower extremity motor function was noted,   the above injectate was administered so that equal amounts of the injectate were placed at each foramen (level) into the transforaminal epidural space.   Additional Comments:  No complications occurred Dressing: 2 x 2 sterile gauze and Band-Aid    Post-procedure details: Patient was observed during the procedure. Post-procedure instructions were reviewed.  Patient left the clinic in stable condition.

## 2020-10-05 NOTE — Progress Notes (Signed)
Kelly Kelly [name] - 75 y.o. female MRN [id number]  Date of birth: 10/01/1945  Office Visit Note: Visit Date: 09/16/2020 PCP: Elby Showers, MD Referred by: Elby Showers, MD  Subjective: No chief complaint on file.  HPI:  Kelly Kelly [name] is a 75 y.o. female who comes in today  for planned Right L5-S1 Lumbar Transforaminal epidural steroid injection with fluoroscopic guidance.  The Kelly [name] has failed conservative care including home exercise, medications, time and activity modification.  This injection will be diagnostic and hopefully therapeutic.  Please see requesting physician notes for further details and justification.   ROS Otherwise per HPI.  Assessment & Plan: Visit Diagnoses:    ICD-10-CM   1. Lumbar radiculopathy  M54.16 XR C-ARM NO REPORT    Epidural Steroid injection    methylPREDNISolone acetate (DEPO-MEDROL) injection 80 mg      Plan: No additional findings.   Meds & Orders:  Meds ordered this encounter  Medications   methylPREDNISolone acetate (DEPO-MEDROL) injection 80 mg    Orders Placed This Encounter  Procedures   XR C-ARM NO REPORT   Epidural Steroid injection    Follow-up: Return if symptoms worsen or fail to improve.   Procedures: No procedures performed  Lumbosacral Transforaminal Epidural Steroid Injection - Sub-Pedicular Approach with Fluoroscopic Guidance  Kelly [name]: Kelly Kelly [name]      Date of Birth: August 12, 1945 MRN: [id number] PCP: Elby Showers, MD      Visit Date: 09/16/2020   Universal Protocol:    Date/Time: 09/16/2020  Consent Given By: the Kelly [name]  Position: PRONE  Additional Comments: Vital signs were monitored before and after the procedure. Kelly [name] was prepped and draped in the usual sterile fashion. The correct Kelly [name], procedure, and site was verified.   Injection Procedure Details:   Procedure diagnoses: Lumbar radiculopathy [M54.16]    Meds Administered:  Meds ordered this encounter  Medications    methylPREDNISolone acetate (DEPO-MEDROL) injection 80 mg    Laterality: Right  Location/Site:  L5-S1  Needle:5.0 in., 22 ga.  Short bevel or Quincke spinal needle  Needle Placement: Transforaminal  Findings:    -Comments: Excellent flow of contrast along the nerve, nerve root and into the epidural space.  Procedure Details: After squaring off the end-plates to get a true AP view, the C-arm was positioned so that an oblique view of the foramen as noted above was visualized. The target area is just inferior to the "nose of the scotty dog" or sub pedicular. The soft tissues overlying this structure were infiltrated with 2-3 ml. of 1% Lidocaine without Epinephrine.  The spinal needle was inserted toward the target using a "trajectory" view along the fluoroscope beam.  Under AP and lateral visualization, the needle was advanced so it did not puncture dura and was located close the 6 O'Clock position of the pedical in AP tracterory. Biplanar projections were used to confirm position. Aspiration was confirmed to be negative for CSF and/or blood. A 1-2 ml. volume of Isovue-250 was injected and flow of contrast was noted at each level. Radiographs were obtained for documentation purposes.   After attaining the desired flow of contrast documented above, a 0.5 to 1.0 ml test dose of 0.25% Marcaine was injected into each respective transforaminal space.  The Kelly [name] was observed for 90 seconds post injection.  After no sensory deficits were reported, and normal lower extremity motor function was noted,   the above injectate was administered so that equal amounts of the injectate were placed at each  foramen (level) into the transforaminal epidural space.   Additional Comments:  No complications occurred Dressing: 2 x 2 sterile gauze and Band-Aid    Post-procedure details: Kelly [name] was observed during the procedure. Post-procedure instructions were reviewed.  Kelly [name] left the clinic in stable  condition.    Clinical History: MRI LUMBAR SPINE WITHOUT CONTRAST   TECHNIQUE: Multiplanar, multisequence MR imaging of the lumbar spine was performed. No intravenous contrast was administered.   COMPARISON:  Radiography 12/17/2019.   FINDINGS: Segmentation:  5 lumbar type vertebral bodies.   Alignment:  Curvature convex to the right with the apex at L2-3.   Vertebrae: Prominent discogenic edema within the left marrow at L3-4 endplates, finding that can be correlated with regional back pain.   Conus medullaris and cauda equina: Conus extends to the T12-L1 level. Conus and cauda equina appear normal.   Paraspinal and other soft tissues: Incidental renal cysts.   Disc levels:   No significant finding from T10-11 through L1-2.   L2-3: Chronic disc degeneration with loss of height. Endplate osteophytes and mild bulging of the disc. Mild narrowing of the left lateral recess and intervertebral foramen on the left but without definite neural compression.   L3-4: Chronic disc degeneration with loss of height, more pronounced on the left. Endplate osteophytes and bulging of the disc. Superior endplate Schmorl's node at L4. Discogenic edema within the endplates as noted above, which could be correlated with back pain. Mild facet and ligamentous hypertrophy. Canal stenosis but without distinct neural compression in the canal. Left foraminal stenosis likely to affect the L3 nerve.   L4-5: Mild bulging of the disc. Mild facet and ligamentous hypertrophy. Mild stenosis of the lateral recesses and neural foramina but no visible neural compression.   L5-S1: Mild bulging of the disc, more prominent in the foramen on the right. Facet degeneration and hypertrophy right more than left. No central canal stenosis. Right foraminal narrowing with some potential to affect the exiting right L5 nerve.   IMPRESSION: 1. Spinal curvature convex to the right with the apex at L2-3. 2. Left  foraminal stenosis at L3-4 due to encroachment by osteophyte and bulging disc likely to affect the L3 nerve. Discogenic edema of the endplates at [id number] which could be correlated with back pain. 3. Right foraminal narrowing at L5-S1 due to encroachment by osteophyte and bulging disc that could possibly affect the right L5 nerve. 4. Lesser, grossly non-compressive degenerative changes at L2-3 and L4-5.     Electronically Signed   By: Nelson Chimes M.D.   On: 01/05/2020 08:18     Objective:  VS:  HT:    WT:   BMI:     BP:133/86  HR:72bpm  TEMP: ( )  RESP:  Physical Exam Vitals and nursing note reviewed.  Constitutional:      General: She is not in acute distress.    Appearance: Normal appearance. She is not ill-appearing.  HENT:     Head: Normocephalic and atraumatic.     Right Ear: External ear normal.     Left Ear: External ear normal.  Eyes:     Extraocular Movements: Extraocular movements intact.  Cardiovascular:     Rate and Rhythm: Normal rate.     Pulses: Normal pulses.  Pulmonary:     Effort: Pulmonary effort is normal. No respiratory distress.  Abdominal:     General: There is no distension.     Palpations: Abdomen is soft.  Musculoskeletal:  General: Tenderness present.     Cervical back: Neck supple.     Right lower leg: No edema.     Left lower leg: No edema.     Comments: Kelly [name] has good distal strength with no pain over the greater trochanters.  No clonus or focal weakness.  Skin:    Findings: No erythema, lesion or rash.  Neurological:     General: No focal deficit present.     Mental Status: She is alert and oriented to person, place, and time.     Sensory: No sensory deficit.     Motor: No weakness or abnormal muscle tone.     Coordination: Coordination normal.  Psychiatric:        Mood and Affect: Mood normal.        Behavior: Behavior normal.     Imaging: No results found.

## 2020-10-07 DIAGNOSIS — R350 Frequency of micturition: Secondary | ICD-10-CM | POA: Diagnosis not present

## 2020-10-08 ENCOUNTER — Encounter: Payer: Self-pay | Admitting: Rehabilitative and Restorative Service Providers"

## 2020-10-08 ENCOUNTER — Other Ambulatory Visit: Payer: Self-pay

## 2020-10-08 ENCOUNTER — Ambulatory Visit (INDEPENDENT_AMBULATORY_CARE_PROVIDER_SITE_OTHER): Payer: Medicare Other | Admitting: Rehabilitative and Restorative Service Providers"

## 2020-10-08 DIAGNOSIS — G8929 Other chronic pain: Secondary | ICD-10-CM | POA: Diagnosis not present

## 2020-10-08 DIAGNOSIS — M5441 Lumbago with sciatica, right side: Secondary | ICD-10-CM | POA: Diagnosis not present

## 2020-10-08 DIAGNOSIS — R293 Abnormal posture: Secondary | ICD-10-CM | POA: Diagnosis not present

## 2020-10-08 DIAGNOSIS — R262 Difficulty in walking, not elsewhere classified: Secondary | ICD-10-CM | POA: Diagnosis not present

## 2020-10-08 NOTE — Therapy (Signed)
[facility] Physical Therapy [address] [place], [place], [address] Phone: [phone]   Fax:  [phone]  Physical Therapy Treatment  Patient Details  Name: Kelly [name] MRN: [id number] Date of Birth: [birth year] Referring Provider (PT): Magnus Sinning, MD   Encounter Date: 10/08/2020   PT End of Session - 10/08/20 0756     Visit Number 3    Number of Visits 20    Date for PT Re-Evaluation 12/07/20    Authorization Type Medicare, BCBS - KX at 15    Progress Note Due on Visit 10    PT Start Time 0758    PT Stop Time 0837    PT Time Calculation (min) 39 min    Activity Tolerance Patient tolerated treatment well    Behavior During Therapy [facility] for tasks assessed/performed             Past Medical History:  Diagnosis Date   Depression    Hyperlipidemia    Hypertension    Osteoporosis     Past Surgical History:  Procedure Laterality Date   ABDOMINAL HYSTERECTOMY     BREAST REDUCTION SURGERY     and lift   BREAST REDUCTION SURGERY Bilateral    CARPAL TUNNEL RELEASE  05/11   right   CHOLECYSTECTOMY     FACIAL COSMETIC SURGERY  2/10   KNEE ARTHROSCOPY  2007   left & right   LIPOSUCTION EXTREMITIES     thighs   PARTIAL KNEE ARTHROPLASTY Right 10/02/2014   Procedure: RIGHT KNEE MEDIAL UNICOMPARTMENTAL ARTHROPLASTY;  Surgeon: Mcarthur Rossetti, MD;  Location: [facility];  Service: Orthopedics;  Laterality: Right;   REDUCTION MAMMAPLASTY     bilateral   TOTAL KNEE REVISION  10/12/2011   Procedure: TOTAL KNEE REVISION;  Surgeon: Mcarthur Rossetti, MD;  Location: [facility];  Service: Orthopedics;  Laterality: Left;  Left Total Knee Revision Arthroplasty   TRIGGER FINGER RELEASE  5/11   right 4th finger    There were no vitals filed for this visit.   Subjective Assessment - 10/08/20 0803     Subjective Pt. indicated feeling better, no specific complaints noted upon arrival today.  Pt. stated she likes the exercises.    Pertinent  History Hx of scoliosis,   Injection performed 09/16/2020.  hyperlipidemia, depression, HTN, osteoporosis    Limitations Walking;Standing;House hold activities    Diagnostic tests MRI previously    Patient Stated Goals Reduce pain, get back to normal life/activity    Currently in Pain? No/denies    Pain Score 0-No pain    Pain Onset More than a month ago                [facility] PT Assessment - 10/08/20 0001       Assessment   Medical Diagnosis M54.16 (ICD-10-CM) - Lumbar radiculopathy  M48.061 (ICD-10-CM) - Foraminal stenosis of lumbar region  M41.9 (ICD-10-CM) - Scoliosis of lumbar spine, unspecified scoliosis type    Referring Provider (PT) Magnus Sinning, MD    Onset Date/Surgical Date 08/13/20    Hand Dominance Left      AROM   Lumbar Extension 75% WFL, repeated x 10 improved to 100 %                           [facility] Adult PT Treatment/Exercise - 10/08/20 0001       Exercises   Other Exercises  Review of existing HEP c verbal cues at times  Lumbar Exercises: Stretches   Lower Trunk Rotation 3 reps   15 sec hold each way   Other Lumbar Stretch Exercise sciatic n. glide supine 90/90 2 x 10      Lumbar Exercises: Aerobic   Nustep Lvl 6 5 mins, Lvl 5 5 mins      Lumbar Exercises: Standing   Other Standing Lumbar Exercises lumbar extension 2 sec hold x 10      Lumbar Exercises: Supine   Clam 20 reps   bilateral   Bridge 20 reps   green band around knees                     PT Short Term Goals - 10/08/20 0819       PT SHORT TERM GOAL #1   Title Patient will demonstrate independent use of home exercise program to maintain progress from in clinic treatments.    Time 3    Period Weeks    Status On-going    Target Date 10/19/20               PT Long Term Goals - 09/28/20 0838       PT LONG TERM GOAL #1   Title Patient will demonstrate/report pain at worst less than or equal to 2/10 to facilitate minimal limitation in daily  activity secondary to pain symptoms.    Time 10    Period Weeks    Status New    Target Date 12/07/20      PT LONG TERM GOAL #2   Title Patient will demonstrate independent use of home exercise program to facilitate ability to maintain/progress functional gains from skilled physical therapy services.    Time 10    Period Weeks    Status New    Target Date 12/07/20      PT LONG TERM GOAL #3   Title Pt. will demonstrate FOTO outcome > or =69% to indicated reduced disability due to condition.    Time 10    Period Weeks    Status New    Target Date 12/07/20      PT LONG TERM GOAL #4   Title Patient will demonstrate lumbar extension 100 % WFL s symptoms to facilitate upright standing, walking posture at PLOF s limitation.    Time 10    Period Weeks    Status New    Target Date 12/07/20      PT LONG TERM GOAL #5   Title Pt. will demonstrate/report ability to perform usual yard work at [employer] s limitation.    Time 10    Period Weeks    Status New    Target Date 12/07/20                   Plan - 10/08/20 0806     Clinical Impression Statement Good recall of HEP at this time and progression in symptom relief to this point.  Recheck of lumbar mobility showed gains compared to evaluation.  If Pt. continues to present c symptom reduction, she may be appropriate for HEP transitioning soon.    Personal Factors and Comorbidities Comorbidity 3+    Comorbidities hyperlipidemia, depression, HTN, osteoporosis    Examination-Activity Limitations Bend;Squat;Caring for Others;Carry;Reach Overhead;Lift;Locomotion Level    Examination-Participation Restrictions Yard Work;Interpersonal Relationship;Community Activity;Volunteer;Laundry;Shop;Cleaning    Stability/Clinical Decision Making Stable/Uncomplicated    Rehab Potential --   fair to good   PT Frequency 2x / week    PT Duration Other (comment)  10 weeks   PT Treatment/Interventions ADLs/Self Care Home  Management;Cryotherapy;Electrical Stimulation;Iontophoresis '4mg'$ /ml Dexamethasone;Moist Heat;Balance training;Therapeutic exercise;Therapeutic activities;Functional mobility training;Stair training;Gait training;DME Instruction;Ultrasound;Neuromuscular re-education;Patient/family education;Passive range of motion;Dry needling;Taping;Manual techniques;Traction    PT Next Visit Plan Aerobic intervention, continue to gain hip and lumbar mobility/strength.  Possible transitioning to HEP as symptom remain lower.    PT Home Exercise Plan [id number]    Consulted and Agree with Plan of Care Patient             Patient will benefit from skilled therapeutic intervention in order to improve the following deficits and impairments:  Pain, Increased muscle spasms, Decreased range of motion, Impaired perceived functional ability, Improper body mechanics, Postural dysfunction, Impaired flexibility, Decreased coordination, Decreased endurance, Hypomobility, Decreased activity tolerance, Decreased mobility, Difficulty walking  Visit Diagnosis: Chronic bilateral low back pain with right-sided sciatica  Difficulty in walking, not elsewhere classified  Abnormal posture     Problem List Patient Active Problem List   Diagnosis Date Noted   Chronic left-sided low back pain with left-sided sciatica 12/17/2019   Diabetes mellitus type 2 in obese ([place]) 12/13/2019   Chronic rhinitis 03/03/2019   Constipation 10/31/2016   Impaired glucose tolerance 10/31/2016   Stage III chronic kidney disease ([place]) 10/31/2016   Urge urinary incontinence 10/31/2016   Status post right partial knee replacement 10/02/2014   Anxiety and depression 11/06/2011   Hypertension 10/13/2010   Hyperlipidemia 10/13/2010   Osteoarthritis 10/13/2010   UNSPECIFIED IRON DEFICIENCY ANEMIA 04/11/2010   DIARRHEA 04/11/2010    Scot Jun, PT, DPT, OCS, ATC 10/08/20  8:30 AM    [facility] Physical Therapy [address] [place], [place], [address] Phone: [phone]   Fax:  [phone]  Name: ESCARLET DEHN MRN: [id number] Date of Birth: 1945-07-22

## 2020-10-12 ENCOUNTER — Other Ambulatory Visit: Payer: Self-pay

## 2020-10-12 ENCOUNTER — Encounter: Payer: Self-pay | Admitting: Rehabilitative and Restorative Service Providers"

## 2020-10-12 ENCOUNTER — Ambulatory Visit (INDEPENDENT_AMBULATORY_CARE_PROVIDER_SITE_OTHER): Payer: Medicare Other | Admitting: Rehabilitative and Restorative Service Providers"

## 2020-10-12 DIAGNOSIS — R293 Abnormal posture: Secondary | ICD-10-CM

## 2020-10-12 DIAGNOSIS — G8929 Other chronic pain: Secondary | ICD-10-CM | POA: Diagnosis not present

## 2020-10-12 DIAGNOSIS — M5441 Lumbago with sciatica, right side: Secondary | ICD-10-CM

## 2020-10-12 DIAGNOSIS — R262 Difficulty in walking, not elsewhere classified: Secondary | ICD-10-CM

## 2020-10-12 NOTE — Therapy (Signed)
[facility] [address] [place], [place], [address] Phone: [phone]   Fax:  [phone]  Physical Therapy Treatment  Patient Details  Name: Kelly [name] MRN: [id number] Date of Birth: 1945-06-30 Referring Provider (PT): Magnus Sinning, MD   Encounter Date: 10/12/2020   PT End of Session - 10/12/20 0929     Visit Number 4    Number of Visits 20    Date for PT Re-Evaluation 12/07/20    Authorization Type Medicare, BCBS - KX at 15    Progress Note Due on Visit 10    PT Start Time 0930    PT Stop Time 1009    PT Time Calculation (min) 39 min    Activity Tolerance Patient tolerated treatment well    Behavior During Therapy [facility] for tasks assessed/performed             Past Medical History:  Diagnosis Date   Depression    Hyperlipidemia    Hypertension    Osteoporosis     Past Surgical History:  Procedure Laterality Date   ABDOMINAL HYSTERECTOMY     BREAST REDUCTION SURGERY     and lift   BREAST REDUCTION SURGERY Bilateral    CARPAL TUNNEL RELEASE  05/11   right   CHOLECYSTECTOMY     FACIAL COSMETIC SURGERY  2/10   KNEE ARTHROSCOPY  2007   left & right   LIPOSUCTION EXTREMITIES     thighs   PARTIAL KNEE ARTHROPLASTY Right 10/02/2014   Procedure: RIGHT KNEE MEDIAL UNICOMPARTMENTAL ARTHROPLASTY;  Surgeon: Mcarthur Rossetti, MD;  Location: [facility];  Service: Orthopedics;  Laterality: Right;   REDUCTION MAMMAPLASTY     bilateral   TOTAL KNEE REVISION  10/12/2011   Procedure: TOTAL KNEE REVISION;  Surgeon: Mcarthur Rossetti, MD;  Location: [facility];  Service: Orthopedics;  Laterality: Left;  Left Total Knee Revision Arthroplasty   TRIGGER FINGER RELEASE  5/11   right 4th finger    There were no vitals filed for this visit.   Subjective Assessment - 10/12/20 0934     Subjective Pt. indicated no pain complaints noted over the weekend.  Pt. indicated she was going to look at switching to [employer] for  long term plan.    Pertinent History Hx of scoliosis,   Injection performed 09/16/2020.  hyperlipidemia, depression, HTN, osteoporosis    Limitations Walking;Standing;House hold activities    Diagnostic tests MRI previously    Patient Stated Goals Reduce pain, get back to normal life/activity    Currently in Pain? No/denies    Pain Score 0-No pain    Pain Onset More than a month ago                               [facility] Adult PT Treatment/Exercise - 10/12/20 0001       Lumbar Exercises: Stretches   Lower Trunk Rotation 3 reps   15 sec hold bilateral   Other Lumbar Stretch Exercise sciatic n. glide supine 90/90 2 x 10 bilateral      Lumbar Exercises: Aerobic   Nustep Lvl 6 10 mins      Lumbar Exercises: Standing   Row Both   3 x 10 green   Theraband Level (Row) Level 3 (Green)    Other Standing Lumbar Exercises lumbar extension 2 sec hold x 10      Lumbar Exercises: Seated   Sit to Stand 10 reps  18 inch table no UE     Lumbar Exercises: Supine   Bridge 20 reps                      PT Short Term Goals - 10/08/20 [id number]       PT SHORT TERM GOAL #1   Title Patient will demonstrate independent use of home exercise program to maintain progress from in clinic treatments.    Time 3    Period Weeks    Status On-going    Target Date 10/19/20               PT Long Term Goals - 09/28/20 0838       PT LONG TERM GOAL #1   Title Patient will demonstrate/report pain at worst less than or equal to 2/10 to facilitate minimal limitation in daily activity secondary to pain symptoms.    Time 10    Period Weeks    Status New    Target Date 12/07/20      PT LONG TERM GOAL #2   Title Patient will demonstrate independent use of home exercise program to facilitate ability to maintain/progress functional gains from skilled physical therapy services.    Time 10    Period Weeks    Status New    Target Date 12/07/20      PT LONG TERM GOAL #3   Title  Pt. will demonstrate FOTO outcome > or =69% to indicated reduced disability due to condition.    Time 10    Period Weeks    Status New    Target Date 12/07/20      PT LONG TERM GOAL #4   Title Patient will demonstrate lumbar extension 100 % WFL s symptoms to facilitate upright standing, walking posture at PLOF s limitation.    Time 10    Period Weeks    Status New    Target Date 12/07/20      PT LONG TERM GOAL #5   Title Pt. will demonstrate/report ability to perform usual yard work at [employer] s limitation.    Time 10    Period Weeks    Status New    Target Date 12/07/20                   Plan - 10/12/20 0948     Clinical Impression Statement Continued reduced complaints noted at this time.  Incorporated scapular strengthening as well as core stabilization into program today c good tolerance.  Pt. to go out of town and will come back to reassess for possible transitioning to HEP.    Personal Factors and Comorbidities Comorbidity 3+    Comorbidities hyperlipidemia, depression, HTN, osteoporosis    Examination-Activity Limitations Bend;Squat;Caring for Others;Carry;Reach Overhead;Lift;Locomotion Level    Examination-Participation Restrictions Yard Work;Interpersonal Relationship;Community Activity;Volunteer;Laundry;Shop;Cleaning    Stability/Clinical Decision Making Stable/Uncomplicated    Rehab Potential --   fair to good   PT Frequency 2x / week    PT Duration Other (comment)   10 weeks   PT Treatment/Interventions ADLs/Self Care Home Management;Cryotherapy;Electrical Stimulation;Iontophoresis '4mg'$ /ml Dexamethasone;Moist Heat;Balance training;Therapeutic exercise;Therapeutic activities;Functional mobility training;Stair training;Gait training;DME Instruction;Ultrasound;Neuromuscular re-education;Patient/family education;Passive range of motion;Dry needling;Taping;Manual techniques;Traction    PT Next Visit Plan Recheck for possible d/c?    PT Home Exercise Plan [id number]     Consulted and Agree with Plan of Care Patient             Patient will benefit from skilled therapeutic intervention in  order to improve the following deficits and impairments:  Pain, Increased muscle spasms, Decreased range of motion, Impaired perceived functional ability, Improper body mechanics, Postural dysfunction, Impaired flexibility, Decreased coordination, Decreased endurance, Hypomobility, Decreased activity tolerance, Decreased mobility, Difficulty walking  Visit Diagnosis: Chronic bilateral low back pain with right-sided sciatica  Difficulty in walking, not elsewhere classified  Abnormal posture     Problem List Patient Active Problem List   Diagnosis Date Noted   Chronic left-sided low back pain with left-sided sciatica 12/17/2019   Diabetes mellitus type 2 in obese ([place]) 12/13/2019   Chronic rhinitis 03/03/2019   Constipation 10/31/2016   Impaired glucose tolerance 10/31/2016   Stage III chronic kidney disease ([place]) 10/31/2016   Urge urinary incontinence 10/31/2016   Status post right partial knee replacement 10/02/2014   Anxiety and depression 11/06/2011   Hypertension 10/13/2010   Hyperlipidemia 10/13/2010   Osteoarthritis 10/13/2010   UNSPECIFIED IRON DEFICIENCY ANEMIA 04/11/2010   DIARRHEA 04/11/2010    Scot Jun, PT, DPT, OCS, ATC 10/12/20  10:03 AM    [facility] [address] [place], [place], [address] Phone: [phone]   Fax:  [phone]  Name: KERSTEN BRIGHTON MRN: [id number] Date of Birth: 07-Jan-1946

## 2020-10-14 DIAGNOSIS — R350 Frequency of micturition: Secondary | ICD-10-CM | POA: Diagnosis not present

## 2020-10-25 ENCOUNTER — Ambulatory Visit (INDEPENDENT_AMBULATORY_CARE_PROVIDER_SITE_OTHER): Payer: Medicare Other | Admitting: Rehabilitative and Restorative Service Providers"

## 2020-10-25 ENCOUNTER — Encounter: Payer: Self-pay | Admitting: Rehabilitative and Restorative Service Providers"

## 2020-10-25 ENCOUNTER — Other Ambulatory Visit: Payer: Self-pay

## 2020-10-25 DIAGNOSIS — M5441 Lumbago with sciatica, right side: Secondary | ICD-10-CM

## 2020-10-25 DIAGNOSIS — R262 Difficulty in walking, not elsewhere classified: Secondary | ICD-10-CM | POA: Diagnosis not present

## 2020-10-25 DIAGNOSIS — R293 Abnormal posture: Secondary | ICD-10-CM | POA: Diagnosis not present

## 2020-10-25 DIAGNOSIS — G8929 Other chronic pain: Secondary | ICD-10-CM | POA: Diagnosis not present

## 2020-10-25 NOTE — Therapy (Signed)
[facility] [address] [place], [place], [address] Phone: [phone]   Fax:  [phone]  Physical Therapy Treatment/Discharge  Patient Details  Name: Kelly [name] MRN: [id number] Date of Birth: 02/11/1946 Referring Provider (PT): Magnus Sinning, MD   Encounter Date: 10/25/2020   PT End of Session - 10/25/20 0836     Visit Number 5    Number of Visits 20    Date for PT Re-Evaluation 12/07/20    Authorization Type Medicare, BCBS - [place]X at 15    Progress Note Due on Visit 10    PT Start Time 0845    PT Stop Time 0909    PT Time Calculation (min) 24 min    Activity Tolerance Patient tolerated treatment well    Behavior During Therapy [facility] for tasks assessed/performed             Past Medical History:  Diagnosis Date   Depression    Hyperlipidemia    Hypertension    Osteoporosis     Past Surgical History:  Procedure Laterality Date   ABDOMINAL HYSTERECTOMY     BREAST REDUCTION SURGERY     and lift   BREAST REDUCTION SURGERY Bilateral    CARPAL TUNNEL RELEASE  05/11   right   CHOLECYSTECTOMY     FACIAL COSMETIC SURGERY  2/10   KNEE ARTHROSCOPY  2007   left & right   LIPOSUCTION EXTREMITIES     thighs   PARTIAL KNEE ARTHROPLASTY Right 10/02/2014   Procedure: RIGHT KNEE MEDIAL UNICOMPARTMENTAL ARTHROPLASTY;  Surgeon: Mcarthur Rossetti, MD;  Location: [facility];  Service: Orthopedics;  Laterality: Right;   REDUCTION MAMMAPLASTY     bilateral   TOTAL KNEE REVISION  10/12/2011   Procedure: TOTAL KNEE REVISION;  Surgeon: Mcarthur Rossetti, MD;  Location: [facility];  Service: Orthopedics;  Laterality: Left;  Left Total Knee Revision Arthroplasty   TRIGGER FINGER RELEASE  5/11   right 4th finger    There were no vitals filed for this visit.   Subjective Assessment - 10/25/20 0852     Subjective Pt. stated no pain complaints to report today.  Feeling good and ready for d/c.  Pt. stated she did her stretches but  didn't walk enough.    Pertinent History Hx of scoliosis,   Injection performed 09/16/2020.  hyperlipidemia, depression, HTN, osteoporosis    Limitations Walking;Standing;House hold activities    Diagnostic tests MRI previously    Patient Stated Goals Reduce pain, get back to normal life/activity    Currently in Pain? 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  Question and answer on frequency and use going forward      Lumbar Exercises: Aerobic   Nustep Lvl 6 10 mins                     PT Education - 10/25/20 0911     Education Details Review of HEP for d/c.    Person(s) Educated Patient    Methods Explanation;Verbal cues    Comprehension Verbalized understanding              PT Short Term Goals - 10/25/20 0856       PT SHORT TERM GOAL #1   Title Patient will demonstrate independent use of home exercise program to maintain progress from in clinic treatments.    Time 3    Period Weeks    Status Achieved    Target Date 10/19/20               PT Long Term Goals - 10/25/20 0856       PT LONG TERM GOAL #1   Title Patient will demonstrate/report pain at worst less than or equal to 2/10 to facilitate minimal limitation  in daily activity secondary to pain symptoms.    Time 10    Period Weeks    Status Achieved      PT LONG TERM GOAL #2   Title Patient will demonstrate independent use of home exercise program to facilitate ability to maintain/progress functional gains from skilled physical therapy services.    Time 10    Period Weeks    Status Achieved      PT LONG TERM GOAL #3   Title Pt. will demonstrate FOTO outcome > or =69% to indicated reduced disability due to condition.    Time 10    Period Weeks    Status Not Met      PT LONG TERM GOAL #4   Title Patient will demonstrate lumbar extension 100 % WFL s symptoms to facilitate upright standing, walking posture at PLOF s limitation.    Time 10    Period Weeks    Status Achieved      PT LONG TERM GOAL #5   Title Pt. will demonstrate/report ability to perform usual yard work at [employer] s limitation.    Time 10    Period Weeks    Status Achieved                   Plan - 10/25/20 0910     Clinical Impression Statement Pt. attended 5 visits overall during treatment cycle reporting good progress, no pain and good knowledge of HEP at this time.  Pt. was appropriate for d/c to HEP and recommendation for gym membership for continued activity for continued gains.  See objective data for updated information.    Personal Factors and Comorbidities Comorbidity 3+    Comorbidities hyperlipidemia, depression, HTN, osteoporosis    Examination-Activity Limitations Bend;Squat;Caring for Others;Carry;Reach Overhead;Lift;Locomotion Level    Examination-Participation Restrictions Yard Work;Interpersonal Relationship;Community Activity;Volunteer;Laundry;Shop;Cleaning    Stability/Clinical Decision Making Stable/Uncomplicated    Rehab Potential --   fair to good   PT Treatment/Interventions ADLs/Self Care Home Management;Cryotherapy;Electrical Stimulation;Iontophoresis 31m/ml Dexamethasone;Moist Heat;Balance training;Therapeutic exercise;Therapeutic  activities;Functional mobility training;Stair training;Gait training;DME Instruction;Ultrasound;Neuromuscular re-education;Patient/family education;Passive range of motion;Dry needling;Taping;Manual techniques;Traction    PT Next Visit Plan D/C to HEP  PT Home Exercise Plan [id number]    Consulted and Agree with Plan of Care Patient             Patient will benefit from skilled therapeutic intervention in order to improve the following deficits and impairments:  Pain, Increased muscle spasms, Decreased range of motion, Impaired perceived functional ability, Improper body mechanics, Postural dysfunction, Impaired flexibility, Decreased coordination, Decreased endurance, Hypomobility, Decreased activity tolerance, Decreased mobility, Difficulty walking  Visit Diagnosis: Chronic bilateral low back pain with right-sided sciatica  Difficulty in walking, not elsewhere classified  Abnormal posture     Problem List Patient Active Problem List   Diagnosis Date Noted   Chronic left-sided low back pain with left-sided sciatica 12/17/2019   Diabetes mellitus type 2 in obese ([place]) 12/13/2019   Chronic rhinitis 03/03/2019   Constipation 10/31/2016   Impaired glucose tolerance 10/31/2016   Stage III chronic kidney disease ([place]) 10/31/2016   Urge urinary incontinence 10/31/2016   Status post right partial knee replacement 10/02/2014   Anxiety and depression 11/06/2011   Hypertension 10/13/2010   Hyperlipidemia 10/13/2010   Osteoarthritis 10/13/2010   UNSPECIFIED IRON DEFICIENCY ANEMIA 04/11/2010   DIARRHEA 04/11/2010   PHYSICAL THERAPY DISCHARGE SUMMARY  Visits from Start of Care: 5  Current functional level related to goals / functional outcomes: See note   Remaining deficits: See note   Education / Equipment: HEP   Patient agrees to discharge. Patient goals were met. Patient is being discharged due to being pleased with the current functional level.  Scot Jun, PT, DPT,  OCS, ATC 10/25/20  9:12 AM    [facility] [address] [place], [place], [address] Phone: [phone]   Fax:  [phone]  Name: Kelly [name] MRN: [id number] Date of Birth: [birth year]

## 2020-10-28 DIAGNOSIS — R350 Frequency of micturition: Secondary | ICD-10-CM | POA: Diagnosis not present

## 2020-10-29 ENCOUNTER — Encounter: Payer: Medicare Other | Admitting: Rehabilitative and Restorative Service Providers"

## 2020-11-01 ENCOUNTER — Encounter: Payer: Medicare Other | Admitting: Rehabilitative and Restorative Service Providers"

## 2020-11-02 ENCOUNTER — Other Ambulatory Visit: Payer: Self-pay | Admitting: Internal Medicine

## 2020-11-02 DIAGNOSIS — E2839 Other primary ovarian failure: Secondary | ICD-10-CM

## 2020-11-03 ENCOUNTER — Encounter: Payer: Medicare Other | Admitting: Rehabilitative and Restorative Service Providers"

## 2020-11-04 DIAGNOSIS — N3281 Overactive bladder: Secondary | ICD-10-CM | POA: Diagnosis not present

## 2020-11-04 DIAGNOSIS — R350 Frequency of micturition: Secondary | ICD-10-CM | POA: Diagnosis not present

## 2020-11-04 DIAGNOSIS — R351 Nocturia: Secondary | ICD-10-CM | POA: Diagnosis not present

## 2020-11-05 ENCOUNTER — Ambulatory Visit
Admission: RE | Admit: 2020-11-05 | Discharge: 2020-11-05 | Disposition: A | Discharge status: Home / Self Care | Payer: Medicare Other | Source: Ambulatory Visit | Attending: Internal Medicine | Admitting: Internal Medicine

## 2020-11-05 ENCOUNTER — Other Ambulatory Visit: Payer: Self-pay

## 2020-11-05 DIAGNOSIS — M8589 Other specified disorders of bone density and structure, multiple sites: Secondary | ICD-10-CM | POA: Diagnosis not present

## 2020-11-19 ENCOUNTER — Other Ambulatory Visit: Payer: Self-pay

## 2020-11-19 ENCOUNTER — Other Ambulatory Visit: Payer: Medicare Other | Admitting: Internal Medicine

## 2020-11-19 DIAGNOSIS — Z13220 Encounter for screening for lipoid disorders: Secondary | ICD-10-CM | POA: Diagnosis not present

## 2020-11-19 DIAGNOSIS — Z0000 Encounter for general adult medical examination without abnormal findings: Secondary | ICD-10-CM | POA: Diagnosis not present

## 2020-11-19 DIAGNOSIS — R5383 Other fatigue: Secondary | ICD-10-CM

## 2020-11-19 DIAGNOSIS — I10 Essential (primary) hypertension: Secondary | ICD-10-CM

## 2020-11-20 LAB — CBC WITH DIFFERENTIAL/PLATELET
Absolute Monocytes: 324 cells/uL (ref 200–950)
Basophils Absolute: 9 cells/uL (ref 0–200)
Basophils Relative: 0.2 %
Eosinophils Absolute: 90 cells/uL (ref 15–500)
Eosinophils Relative: 2 %
HCT: 41.2 % (ref 35.0–45.0)
Hemoglobin: 13.1 g/dL (ref 11.7–15.5)
Lymphs Abs: 2039 cells/uL (ref 850–3900)
MCH: 28.7 pg (ref 27.0–33.0)
MCHC: 31.8 g/dL — ABNORMAL LOW (ref 32.0–36.0)
MCV: 90.4 fL (ref 80.0–100.0)
MPV: 11.1 fL (ref 7.5–12.5)
Monocytes Relative: 7.2 %
Neutro Abs: 2039 cells/uL (ref 1500–7800)
Neutrophils Relative %: 45.3 %
Platelets: 215 10*3/uL (ref 140–400)
RBC: 4.56 10*6/uL (ref 3.80–5.10)
RDW: 14.6 % (ref 11.0–15.0)
Total Lymphocyte: 45.3 %
WBC: 4.5 10*3/uL (ref 3.8–10.8)

## 2020-11-20 LAB — COMPLETE METABOLIC PANEL WITH GFR
AG Ratio: 1.8 (calc) (ref 1.0–2.5)
ALT: 16 U/L (ref 6–29)
AST: 23 U/L (ref 10–35)
Albumin: 4.1 g/dL (ref 3.6–5.1)
Alkaline phosphatase (APISO): 103 U/L (ref 37–153)
BUN/Creatinine Ratio: 13 (calc) (ref 6–22)
BUN: 18 mg/dL (ref 7–25)
CO2: 29 mmol/L (ref 20–32)
Calcium: 10.2 mg/dL (ref 8.6–10.4)
Chloride: 106 mmol/L (ref 98–110)
Creat: 1.34 mg/dL — ABNORMAL HIGH (ref 0.60–1.00)
Globulin: 2.3 g/dL (calc) (ref 1.9–3.7)
Glucose, Bld: 88 mg/dL (ref 65–99)
Potassium: 4.2 mmol/L (ref 3.5–5.3)
Sodium: 141 mmol/L (ref 135–146)
Total Bilirubin: 0.8 mg/dL (ref 0.2–1.2)
Total Protein: 6.4 g/dL (ref 6.1–8.1)
eGFR: 41 mL/min/{1.73_m2} — ABNORMAL LOW (ref >=60)

## 2020-11-20 LAB — LIPID PANEL
Cholesterol: 146 mg/dL (ref <200)
HDL: 33 mg/dL — ABNORMAL LOW (ref >=50)
LDL Cholesterol (Calc): 88 mg/dL (calc)
Non-HDL Cholesterol (Calc): 113 mg/dL (calc) (ref <130)
Total CHOL/HDL Ratio: 4.4 (calc) (ref <5.0)
Triglycerides: 150 mg/dL — ABNORMAL HIGH (ref <150)

## 2020-11-20 LAB — TSH: TSH: 3.13 mIU/L (ref 0.40–4.50)

## 2020-11-22 ENCOUNTER — Encounter: Payer: Self-pay | Admitting: Internal Medicine

## 2020-11-22 ENCOUNTER — Other Ambulatory Visit: Payer: Self-pay

## 2020-11-22 ENCOUNTER — Ambulatory Visit (INDEPENDENT_AMBULATORY_CARE_PROVIDER_SITE_OTHER): Payer: Medicare Other | Admitting: Internal Medicine

## 2020-11-22 VITALS — BP 114/72 | HR 64 | Temp 97.8°F | Ht 61.0 in | Wt 192.0 lb

## 2020-11-22 DIAGNOSIS — I10 Essential (primary) hypertension: Secondary | ICD-10-CM | POA: Diagnosis not present

## 2020-11-22 DIAGNOSIS — Z86010 Personal history of colonic polyps: Secondary | ICD-10-CM

## 2020-11-22 DIAGNOSIS — R319 Hematuria, unspecified: Secondary | ICD-10-CM

## 2020-11-22 DIAGNOSIS — Z0000 Encounter for general adult medical examination without abnormal findings: Secondary | ICD-10-CM

## 2020-11-22 DIAGNOSIS — Z6836 Body mass index (BMI) 36.0-36.9, adult: Secondary | ICD-10-CM | POA: Diagnosis not present

## 2020-11-22 DIAGNOSIS — M1990 Unspecified osteoarthritis, unspecified site: Secondary | ICD-10-CM | POA: Diagnosis not present

## 2020-11-22 DIAGNOSIS — N1831 Chronic kidney disease, stage 3a: Secondary | ICD-10-CM

## 2020-11-22 DIAGNOSIS — Z860101 Personal history of adenomatous and serrated colon polyps: Secondary | ICD-10-CM

## 2020-11-22 DIAGNOSIS — N3941 Urge incontinence: Secondary | ICD-10-CM

## 2020-11-22 DIAGNOSIS — Z23 Encounter for immunization: Secondary | ICD-10-CM

## 2020-11-22 LAB — POCT URINALYSIS DIPSTICK
Glucose, UA: NEGATIVE
Ketones, UA: NEGATIVE
Leukocytes, UA: NEGATIVE
Nitrite, UA: NEGATIVE
Protein, UA: POSITIVE — AB
Spec Grav, UA: 1.01 (ref 1.010–1.025)
Urobilinogen, UA: 0.2 E.U./dL
pH, UA: 5 (ref 5.0–8.0)

## 2020-11-22 LAB — LIPID PANEL

## 2020-11-22 LAB — CBC WITH DIFFERENTIAL/PLATELET

## 2020-11-22 LAB — COMPLETE METABOLIC PANEL WITH GFR

## 2020-11-22 LAB — TSH

## 2020-11-22 NOTE — Progress Notes (Signed)
Subjective:   Patient presents for Medicare Annual/Subsequent preventive examination.  Risk Factors  Current exercise habits:  Dietary issues discussed:   Cardiac risk factors:advanced age (older than 65 for men, 11 for women) and dyslipidemia  Depression Screen  (Note: if answer to either of the following is "Yes", a more complete depression screening is indicated)   Over the past two weeks, have you felt down, depressed or hopeless? No Over the past two weeks, have you felt little interest or pleasure in doing things? No Have you lost interest or pleasure in daily life? No Do you often feel hopeless? No Do you cry easily over simple problems? No  Activities of Daily Living  In your present state of health, do you have any difficulty performing the following activities?:   Driving? No Managing money? No Feeding yourself? No Getting from bed to chair?No Climbing a flight of stairs?  No Preparing food and eating?:  No Bathing or showering?   No Getting dressed:  No Getting to the toilet?  No Using the toilet:  No Moving around from place to place:  No In the past year have you fallen or had a near fall?  No Are you sexually active?  Yes Do you have more than one partner?  Yes  Hearing Difficulties:    Do you often ask people to speak up or repeat themselves?  No Do you experience ringing or noises in your ears?   No Do you have difficulty understanding soft or whispered voices?  No Do you feel that you have a problem with memory?  No  Do you often misplace items?  No   Home Safety:  Do you have a smoke alarm at your residence? Yes Do you have grab bars in the bathroom?  Yes Do you have throw rugs in your house?   Yes   Cognitive Testing  Alert? Yes Normal Appearance?Yes  Oriented to person? Yes Place? Yes  Time? Yes  Recall of three objects? Yes  Can perform simple calculations? Yes  Displays appropriate judgment?Yes  Can read the correct time from a watch  face?Yes   List the Names of Other Physician/Practitioners you currently use:  See referral list for the physicians patient is currently seeing.    Patient Instructions (the written plan) was given to the patient.  Medicare Attestation  I have personally reviewed:  The patient's medical and social history  Their use of alcohol, tobacco or illicit drugs  Their current medications and supplements  The patient's functional ability including ADLs,fall risks, home safety risks, cognitive, and hearing and visual impairment  Diet and physical activities  Evidence for depression or mood disorders  The patient's weight, height, BMI, and visual acuity have been recorded in the chart. I have made referrals, counseling, and provided education to the patient based on review of the above and I have provided the patient with a written personalized care plan for preventive services.

## 2020-11-22 NOTE — Progress Notes (Signed)
Subjective:    Patient ID: Kelly [name], female    DOB: [birth year], 75 y.o.   MRN: [id number]  HPI 75 year old Female for health maintenace exam, Medicare wellness, and evaluation of medical issues.   Seen by [facility] in June for Stage 3a chronic kidney disease.Cr 1.25.  GFR was 45 cc/ min  Multiple precancerous adenomatous polyps on colonoscopy January 2022.  F/u per [facility] in 3 years.  Has been going to physical therapy in August and September for chronic bilateral low back pain which is helped.  Saw Dr. Ernestina Patches in August for lumbar radiculopathy and had epidural steroid injection.  History of right foraminal narrowing L5-S1 and multilevel facet hypertrophy based on MRI in 2021.  Seen in [facility] for urinary frequency.  History of overactive bladder.  Did not tolerate Ditropan because of side effects including dry mouth.  Main complaint is nocturia.  Has failed Myrbetriq. PTNS recommended by urologist.  She has a history of hyperlipidemia maintained on generic Crestor, essential hypertension and impaired glucose tolerance.  Elevated serum creatinine likely due to years of hypertension.  Patient had multiple plastic surgery procedures in 2002 in 2003 including abdominoplasty, breast reduction surgery, bilateral brachioplasty.  History of facial cosmetic surgery with laser peel in 2018.  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 Fredna Dow in 2011.  History of right carpal tunnel syndrome.  Total left knee arthroplasty 2013, right knee arthroscopic surgery 2007, cholecystectomy 1965.  Hysterectomy without oophorectomy at age 53.  Right knee arthroplasty by Dr. Ninfa Linden in 2016.  Social history: Patient was raised in an orphanage in [place].  Patient's daughter died of metastatic ovarian cancer.  Patient previously worked for the [employer] and is currently working as a Actuary.  She is divorced.  Drinks 2 glasses of wine daily.   Does not smoke.  Family history: Father died of alcoholism.  Mother died of heart disease.  1 sister with history of lung cancer.  3 sisters in good health.  Review of Systems see above-main issue is overactive bladder     Objective:   Physical Exam Vital signs reviewed.  Skin: Warm and dry.  No cervical adenopathy, thyromegaly, or carotid bruits.  Chest is clear to auscultation.  Cardiac exam: Regular rate and rhythm.  Breasts are without masses.  Abdomen is soft nondistended without hepatosplenomegaly masses or tenderness.  No lower extremity pitting edema.  Bimanual normal.  Neurological exam is intact without gross focal deficits       Assessment & Plan:  Essential hypertension-stable on current regimen of amlodipine  Chronic kidney disease stage IIIa likely the result of hypertension and use of nonsteroidal anti-inflammatory medications.  Followed by [facility] and condition is stable.  Hyperlipidemia-stable on statin medication-Crestor 10 mg daily   Impaired glucose tolerance stable with diet alone  History of anxiety  BMI is 36.28.  Continue with diet and exercise efforts  Urinary incontinence/overactive bladder being seen at [facility]  Musculoskeletal pain-she has seen Dr. Hyman Hopes, chiropractor in the past  Multiple precancerous polyps on colonoscopy January 2022.  Has follow-up recommended in 3 years.  Plan: Continue current medications and return in 6 months.  Continue treatment for overactive bladder at [facility].   Subjective:   Patient presents for Medicare Annual/Subsequent preventive examination.  Review Past Medical/Family/Social: See above   Risk Factors  Current exercise habits: Light Dietary issues discussed: Yes  Cardiac risk factors: Hyperlipidemia  Depression Screen  (  Note: if answer to either of the following is "Yes", a more complete depression screening is indicated)   Over the past two weeks, have you felt down,  depressed or hopeless? No  Over the past two weeks, have you felt little interest or pleasure in doing things? No Have you lost interest or pleasure in daily life? No Do you often feel hopeless? No Do you cry easily over simple problems? No   Activities of Daily Living  In your present state of health, do you have any difficulty performing the following activities?:   Driving? No  Managing money? No  Feeding yourself? No  Getting from bed to chair? No  Climbing a flight of stairs? No  Preparing food and eating?: No  Bathing or showering? No  Getting dressed: No  Getting to the toilet? No  Using the toilet:No  Moving around from place to place: No  In the past year have you fallen or had a near fall?:No  Are you sexually active? No  Do you have more than one partner? No   Hearing Difficulties: No  Do you often ask people to speak up or repeat themselves? No  Do you experience ringing or noises in your ears? No  Do you have difficulty understanding soft or whispered voices? No  Do you feel that you have a problem with memory? No Do you often misplace items? No    Home Safety:  Do you have a smoke alarm at your residence? Yes Do you have grab bars in the bathroom?  Yes Do you have throw rugs in your house?  Yes   Cognitive Testing  Alert? Yes Normal Appearance?Yes  Oriented to person? Yes Place? Yes  Time? Yes  Recall of three objects? Yes  Can perform simple calculations? Yes  Displays appropriate judgment?Yes  Can read the correct time from a watch face?Yes   List the Names of Other Physician/Practitioners you currently use:  See referral list for the physicians patient is currently seeing.  [facility]  [facility]   Review of Systems: See above   Objective:     General appearance: Appears stated age and obese  Head: Normocephalic, without obvious abnormality, atraumatic  Eyes: conj clear, EOMi PEERLA  Ears: normal TM's and external ear canals  both ears  Nose: Nares normal. Septum midline. Mucosa normal. No drainage or sinus tenderness.  Throat: lips, mucosa, and tongue normal; teeth and gums normal  Neck: no adenopathy, no carotid bruit, no JVD, supple, symmetrical, trachea midline and thyroid not enlarged, symmetric, no tenderness/mass/nodules  No CVA tenderness.  Lungs: clear to auscultation bilaterally  Breasts: normal appearance, no masses or tenderness Heart: regular rate and rhythm, S1, S2 normal, no murmur, click, rub or gallop  Abdomen: soft, non-tender; bowel sounds normal; no masses, no organomegaly  Musculoskeletal: ROM normal in all joints, no crepitus, no deformity, Normal muscle strengthen. Back  is symmetric, no curvature. Skin: Skin color, texture, turgor normal. No rashes or lesions  Lymph nodes: Cervical, supraclavicular, and axillary nodes normal.  Neurologic: CN 2 -12 Normal, Normal symmetric reflexes. Normal coordination and gait  Psych: Alert & Oriented x 3, Mood appear stable.    Assessment:    Annual wellness medicare exam   Plan:    During the course of the visit the patient was educated and counseled about appropriate screening and preventive services including:   Mammogram  Colonoscopy is up-to-date     Patient Instructions (the written plan) was given to the  patient.  Medicare Attestation  I have personally reviewed:  The patient's medical and social history  Their use of alcohol, tobacco or illicit drugs  Their current medications and supplements  The patient's functional ability including ADLs,fall risks, home safety risks, cognitive, and hearing and visual impairment  Diet and physical activities  Evidence for depression or mood disorders  The patient's weight, height, BMI, and visual acuity have been recorded in the chart. I have made referrals, counseling, and provided education to the patient based on review of the above and I have provided the patient with a written personalized  care plan for preventive services.

## 2020-11-23 LAB — URINE CULTURE
MICRO NUMBER:: 12481740
SPECIMEN QUALITY:: ADEQUATE

## 2020-11-25 DIAGNOSIS — H35352 Cystoid macular degeneration, left eye: Secondary | ICD-10-CM | POA: Diagnosis not present

## 2020-11-30 DIAGNOSIS — R350 Frequency of micturition: Secondary | ICD-10-CM | POA: Diagnosis not present

## 2020-12-21 ENCOUNTER — Telehealth: Payer: Self-pay

## 2020-12-21 NOTE — Telephone Encounter (Signed)
Scheduled

## 2020-12-21 NOTE — Telephone Encounter (Signed)
Patient requesting an appointment. She states that there is a knot on the back of her neck and it has grown in the last week. She states that it is not painful but she wants it checked before she gets scared. She needs am before 11 if possible. Please advise.

## 2020-12-24 ENCOUNTER — Encounter: Payer: Self-pay | Admitting: Internal Medicine

## 2020-12-24 ENCOUNTER — Ambulatory Visit (INDEPENDENT_AMBULATORY_CARE_PROVIDER_SITE_OTHER): Payer: Medicare Other | Admitting: Internal Medicine

## 2020-12-24 ENCOUNTER — Ambulatory Visit
Admission: RE | Admit: 2020-12-24 | Discharge: 2020-12-24 | Disposition: A | Discharge status: Home / Self Care | Payer: Medicare Other | Source: Ambulatory Visit | Attending: Internal Medicine | Admitting: Internal Medicine

## 2020-12-24 ENCOUNTER — Other Ambulatory Visit: Payer: Self-pay

## 2020-12-24 VITALS — BP 124/88 | HR 67 | Temp 98.3°F | Resp 16 | Wt 192.0 lb

## 2020-12-24 DIAGNOSIS — R591 Generalized enlarged lymph nodes: Secondary | ICD-10-CM | POA: Diagnosis not present

## 2020-12-24 DIAGNOSIS — R590 Localized enlarged lymph nodes: Secondary | ICD-10-CM | POA: Diagnosis not present

## 2020-12-24 DIAGNOSIS — R059 Cough, unspecified: Secondary | ICD-10-CM | POA: Diagnosis not present

## 2020-12-24 DIAGNOSIS — K449 Diaphragmatic hernia without obstruction or gangrene: Secondary | ICD-10-CM | POA: Diagnosis not present

## 2020-12-24 MED ORDER — DOXYCYCLINE HYCLATE 100 MG PO TABS: 100.0000 mg | ORAL_TABLET | Freq: Two times a day (BID) | ORAL | 0 refills | Status: DC

## 2020-12-24 NOTE — Progress Notes (Signed)
   Subjective:    Patient ID: Kelly [name], female    DOB: [birth year], 75 y.o.   MRN: [id number]  HPI 75 year old Female seen for quarter sized mass left posterior neck present for about 2 weeks. She feels it has gotten larger recently. Has no cough, URI symptoms, sore throat, night sweats, weight loss or loss of appetite. Dog passed away recently.CBC in Dec 30, 2022 was normal. Hx chronic kidney disease  with recent creatinine 1.34 and stable. LFTs on 30-Dec-2022 were normal. Has right sided sciatica and has been getting PT.  Hx hyperlipidemia, HTN, osteoporosis.  History of positive Cologuard test and had colonoscopy by Dr. Henrene Pastor in January with several adenomatous polyps being removed.  Repeat study recommended in 3 years.  Had mammogram in August which was normal.  Review of Systems     Objective:   Physical Exam        Assessment & Plan:

## 2020-12-24 NOTE — Patient Instructions (Addendum)
Have chest x-ray.  Take doxycycline 100 mg twice daily for 10 days.  Follow-up in 2 weeks.

## 2020-12-28 DIAGNOSIS — R350 Frequency of micturition: Secondary | ICD-10-CM | POA: Diagnosis not present

## 2021-01-07 ENCOUNTER — Encounter: Payer: Self-pay | Admitting: Internal Medicine

## 2021-01-07 NOTE — Patient Instructions (Addendum)
It was a pleasure to see you today.  Continue current medications and follow-up in 6 months.  Continue treatment for overactive bladder at [facility].

## 2021-01-10 ENCOUNTER — Other Ambulatory Visit: Payer: Self-pay

## 2021-01-10 ENCOUNTER — Ambulatory Visit (INDEPENDENT_AMBULATORY_CARE_PROVIDER_SITE_OTHER): Payer: Medicare Other | Admitting: Internal Medicine

## 2021-01-10 ENCOUNTER — Encounter: Payer: Self-pay | Admitting: Internal Medicine

## 2021-01-10 VITALS — BP 124/88 | HR 69 | Temp 98.2°F | Ht 61.0 in | Wt 192.0 lb

## 2021-01-10 DIAGNOSIS — R599 Enlarged lymph nodes, unspecified: Secondary | ICD-10-CM | POA: Diagnosis not present

## 2021-01-10 NOTE — Patient Instructions (Addendum)
Referral to ENT physician for evaluatuon of nickle sized mass left posterior neck occipital area

## 2021-01-10 NOTE — Progress Notes (Signed)
   Subjective:    Patient ID: Kelly [name], female    DOB: February 07, 1946, 75 y.o.   MRN: [id number]  HPI 75 year old Female seen for follow up slightly tender nodule left occipital neck area. No recent cough or cold. No hx of dental issues. No headache, weight loss, night sweats.  She first brought this to my attention when she was here on November 11.  She has a history of hypertension, hyperlipidemia and osteoporosis.  Mammogram in August was normal.  No night sweats.  She had a chest x-ray on November 11 showing increased signs of retrocardiac hernia but no evidence of mediastinal or hilar adenopathy.  She was given a course of doxycycline 100 mg twice daily for 10-day which she has completed without change in size of this nodule in the left occipital neck area.  She is now here for follow-up.    Review of Systems see above and see health maintenance exam done November 22, 2020     Objective:   Physical Exam Still has nickel sized nodule left posterior neck that has not changed in size.       Assessment & Plan:  At this point I am not sure how we should proceed but she would like to have an answer as to what is causing this.  She feels exam normal.  I am going to refer her to ENT physician for evaluation.

## 2021-01-19 DIAGNOSIS — H35352 Cystoid macular degeneration, left eye: Secondary | ICD-10-CM | POA: Diagnosis not present

## 2021-01-19 DIAGNOSIS — Z961 Presence of intraocular lens: Secondary | ICD-10-CM | POA: Diagnosis not present

## 2021-01-19 DIAGNOSIS — H35371 Puckering of macula, right eye: Secondary | ICD-10-CM | POA: Diagnosis not present

## 2021-02-01 DIAGNOSIS — R350 Frequency of micturition: Secondary | ICD-10-CM | POA: Diagnosis not present

## 2021-02-01 DIAGNOSIS — R221 Localized swelling, mass and lump, neck: Secondary | ICD-10-CM | POA: Diagnosis not present

## 2021-02-28 DIAGNOSIS — L600 Ingrowing nail: Secondary | ICD-10-CM | POA: Diagnosis not present

## 2021-02-28 DIAGNOSIS — M79671 Pain in right foot: Secondary | ICD-10-CM | POA: Diagnosis not present

## 2021-03-01 DIAGNOSIS — R350 Frequency of micturition: Secondary | ICD-10-CM | POA: Diagnosis not present

## 2021-03-07 ENCOUNTER — Other Ambulatory Visit: Payer: Self-pay | Admitting: Internal Medicine

## 2021-03-11 DIAGNOSIS — R0989 Other specified symptoms and signs involving the circulatory and respiratory systems: Secondary | ICD-10-CM | POA: Diagnosis not present

## 2021-03-14 DIAGNOSIS — T8189XA Other complications of procedures, not elsewhere classified, initial encounter: Secondary | ICD-10-CM | POA: Diagnosis not present

## 2021-03-17 DIAGNOSIS — R351 Nocturia: Secondary | ICD-10-CM | POA: Diagnosis not present

## 2021-03-17 DIAGNOSIS — N3281 Overactive bladder: Secondary | ICD-10-CM | POA: Diagnosis not present

## 2021-03-25 ENCOUNTER — Telehealth: Payer: Self-pay | Admitting: Internal Medicine

## 2021-03-25 ENCOUNTER — Encounter: Payer: Self-pay | Admitting: Internal Medicine

## 2021-03-25 ENCOUNTER — Ambulatory Visit (INDEPENDENT_AMBULATORY_CARE_PROVIDER_SITE_OTHER): Payer: Medicare Other | Admitting: Internal Medicine

## 2021-03-25 ENCOUNTER — Other Ambulatory Visit: Payer: Self-pay

## 2021-03-25 VITALS — BP 148/90 | HR 72 | Temp 97.5°F

## 2021-03-25 DIAGNOSIS — I10 Essential (primary) hypertension: Secondary | ICD-10-CM | POA: Diagnosis not present

## 2021-03-25 DIAGNOSIS — M5417 Radiculopathy, lumbosacral region: Secondary | ICD-10-CM | POA: Diagnosis not present

## 2021-03-25 DIAGNOSIS — M9902 Segmental and somatic dysfunction of thoracic region: Secondary | ICD-10-CM | POA: Diagnosis not present

## 2021-03-25 DIAGNOSIS — M9901 Segmental and somatic dysfunction of cervical region: Secondary | ICD-10-CM | POA: Diagnosis not present

## 2021-03-25 DIAGNOSIS — N1831 Chronic kidney disease, stage 3a: Secondary | ICD-10-CM

## 2021-03-25 DIAGNOSIS — R609 Edema, unspecified: Secondary | ICD-10-CM

## 2021-03-25 DIAGNOSIS — M5032 Other cervical disc degeneration, mid-cervical region, unspecified level: Secondary | ICD-10-CM | POA: Diagnosis not present

## 2021-03-25 DIAGNOSIS — R030 Elevated blood-pressure reading, without diagnosis of hypertension: Secondary | ICD-10-CM

## 2021-03-25 DIAGNOSIS — S29012A Strain of muscle and tendon of back wall of thorax, initial encounter: Secondary | ICD-10-CM | POA: Diagnosis not present

## 2021-03-25 DIAGNOSIS — M9903 Segmental and somatic dysfunction of lumbar region: Secondary | ICD-10-CM | POA: Diagnosis not present

## 2021-03-25 NOTE — Telephone Encounter (Signed)
After talking with Dr Renold Genta called patient and told her to come on into office now, she was on [address] not far away.

## 2021-03-25 NOTE — Progress Notes (Signed)
° °  Subjective:    Patient ID: Kelly [name], female    DOB: 06-Sep-1945, 76 y.o.   MRN: [id number]  HPI 76 year old Female seen for  elevated BP. She was at the chiropractor today and BP was elevated. She was having neck and shoulder pain after working out at [employer] this week.She called concerned about BP and was told to come to the office now.  She does not have a history of hard to control blood pressure and she is only on amlodipine 5 mg daily.  Has not had issues with elevated blood pressure previously.  She is anxious today about her blood pressure being elevated and she rushed over here from the chiropractor's office.  She does not consume a lot of salt and eats a healthy diet.  She is not having any neurological symptoms suggestive of stroke.  Her next scheduled appointment for follow-up of medical issues.  History of chronic kidney disease Stage 3. Seen by Nephrologist once last year and was told to return this year. Not on NSAIDS. Appt due June.  Review of Systems had a toe nail removed recently due to infection. Could not work out as much recently but had a heavy work out this past week.  No chest pain, shortness of breath, headache or visual disturbance.     Objective:   Physical Exam Blood pressure 148/90.  She has had amlodipine today.  Her chest is clear.  Cardiac exam regular rate and rhythm.  Extremities with 1+ pitting edema.  She is a bit anxious today.  Blood pressure was rechecked and similar reading was obtained.       Assessment & Plan:   Elevated blood pressure reading-no neurological symptoms  Essential hypertension treated with amlodipine  Anxiety state- she is alarmed that BP is elevated.  Musculoskeletal pain likely due to exercise  Dependent edema 1+ slight pitting  Hx CKD stage IIIa- is to see Nephrologist later this year for follow up and patient will call for appt. Thought to be due to HTN and NSAID use in the past  Time spent with patient  is 30 minutes.  She will call me this weekend if she has concerns

## 2021-03-25 NOTE — Telephone Encounter (Addendum)
Kelly [name] [phone]  Kelly [name] called to say she had just left the chiropractor and her blood pressure had been elevated there.  141/106  pulse 68 151/104 pulse 69 And 5 minutes ago  174/109  Pulse 67 She also said that yesterday she had a little bit of chest pain but nothing today and it did not last long yesterday.

## 2021-03-27 NOTE — Patient Instructions (Addendum)
Patient will go home and rest over the weekend and monitor her blood pressure and call if not staying under good control.  She will continue with amlodipine 5 mg daily.  Follow-up for medical issues is April is April  Anxiety state-she is alarmed that her blood pressure is elevated as she is alarmed her blood pressure is elevated she is alarmed  Musculoskeletal pain-likely due to exercise.  Patient reassured  History of chronic kidney disease.  Seen by nephrologist once last year and seen by nephrologist once last year 1+ no neurological symptoms no neurological symptoms.  She will call me this weekend if she has any concerns about her blood pressure or it is not well controlled.  She will call me she will call me this weekend if she has concerns if she has concerns.

## 2021-04-05 DIAGNOSIS — R350 Frequency of micturition: Secondary | ICD-10-CM | POA: Diagnosis not present

## 2021-05-03 DIAGNOSIS — R350 Frequency of micturition: Secondary | ICD-10-CM | POA: Diagnosis not present

## 2021-05-24 ENCOUNTER — Other Ambulatory Visit: Payer: Medicare Other

## 2021-05-24 DIAGNOSIS — E785 Hyperlipidemia, unspecified: Secondary | ICD-10-CM

## 2021-05-24 DIAGNOSIS — I10 Essential (primary) hypertension: Secondary | ICD-10-CM | POA: Diagnosis not present

## 2021-05-25 LAB — CBC WITH DIFFERENTIAL/PLATELET
Absolute Monocytes: 342 cells/uL (ref 200–950)
Basophils Absolute: 10 cells/uL (ref 0–200)
Basophils Relative: 0.2 %
Eosinophils Absolute: 61 cells/uL (ref 15–500)
Eosinophils Relative: 1.2 %
HCT: 41.6 % (ref 35.0–45.0)
Hemoglobin: 13.4 g/dL (ref 11.7–15.5)
Lymphs Abs: 1805 cells/uL (ref 850–3900)
MCH: 29 pg (ref 27.0–33.0)
MCHC: 32.2 g/dL (ref 32.0–36.0)
MCV: 90 fL (ref 80.0–100.0)
MPV: 11.9 fL (ref 7.5–12.5)
Monocytes Relative: 6.7 %
Neutro Abs: 2882 cells/uL (ref 1500–7800)
Neutrophils Relative %: 56.5 %
Platelets: 188 10*3/uL (ref 140–400)
RBC: 4.62 10*6/uL (ref 3.80–5.10)
RDW: 13.4 % (ref 11.0–15.0)
Total Lymphocyte: 35.4 %
WBC: 5.1 10*3/uL (ref 3.8–10.8)

## 2021-05-25 LAB — BASIC METABOLIC PANEL
BUN/Creatinine Ratio: 14 (calc) (ref 6–22)
BUN: 18 mg/dL (ref 7–25)
CO2: 28 mmol/L (ref 20–32)
Calcium: 10.1 mg/dL (ref 8.6–10.4)
Chloride: 105 mmol/L (ref 98–110)
Creat: 1.28 mg/dL — ABNORMAL HIGH (ref 0.60–1.00)
Glucose, Bld: 87 mg/dL (ref 65–99)
Potassium: 4.3 mmol/L (ref 3.5–5.3)
Sodium: 142 mmol/L (ref 135–146)

## 2021-05-25 LAB — LIPID PANEL
Cholesterol: 147 mg/dL (ref <200)
HDL: 37 mg/dL — ABNORMAL LOW (ref >=50)
LDL Cholesterol (Calc): 84 mg/dL (calc)
Non-HDL Cholesterol (Calc): 110 mg/dL (calc) (ref <130)
Total CHOL/HDL Ratio: 4 (calc) (ref <5.0)
Triglycerides: 165 mg/dL — ABNORMAL HIGH (ref <150)

## 2021-05-27 ENCOUNTER — Encounter: Payer: Self-pay | Admitting: Internal Medicine

## 2021-05-27 ENCOUNTER — Ambulatory Visit (INDEPENDENT_AMBULATORY_CARE_PROVIDER_SITE_OTHER): Payer: Medicare Other | Admitting: Internal Medicine

## 2021-05-27 VITALS — BP 128/80 | HR 59 | Temp 97.9°F | Ht 61.0 in | Wt 193.0 lb

## 2021-05-27 DIAGNOSIS — I10 Essential (primary) hypertension: Secondary | ICD-10-CM | POA: Diagnosis not present

## 2021-05-27 DIAGNOSIS — Z6836 Body mass index (BMI) 36.0-36.9, adult: Secondary | ICD-10-CM | POA: Diagnosis not present

## 2021-05-27 DIAGNOSIS — N1831 Chronic kidney disease, stage 3a: Secondary | ICD-10-CM

## 2021-05-27 DIAGNOSIS — R609 Edema, unspecified: Secondary | ICD-10-CM | POA: Diagnosis not present

## 2021-05-27 NOTE — Progress Notes (Signed)
? ?  Subjective:  ? ? Patient ID: Kelly [name], female    DOB: 1945/11/17, 76 y.o.   MRN: [id number] ? ?HPI 76 year old Female for 6 month recheck.  She has a history of chronic kidney disease stage IIIa.  Creatinine is stable at 1.28.  She has hypertension treated with amlodipine 5 mg daily and blood pressure is excellent at 128/80.  She continues to work on her weight.  Her BMI is 36 and was almost exactly the same 6 months ago.  She does try to exercise and watch what she eats. ? ?She is followed at [facility] for chronic kidney disease stage IIIa and thought to be stable.  History of NSAID use in the past. ? ?History of overactive bladder seen at [facility] ? ?Her fasting lipid panel shows low HDL of 37 but it is better than it was in October 2022 when it was 33.  Triglycerides are 165 and were 150 in October 2022.  Total cholesterol and LDL cholesterol are normal.  She will continue with Crestor 10 mg daily. ? ?Review of Systems see above-no new complaints ? ?   ?Objective:  ? Physical Exam ? BP 128/80 pulse 59 regular T 97.9 pulse ox 97% ?Neck is supple without JVD thyromegaly or carotid bruits.  Her chest is clear.  Cardiac exam: Regular rate and rhythm without ectopy.  No lower extremity pitting edema. ? ? ?   ?Assessment & Plan:  ?Essential hypertension-stable on amlodipine ? ?Hyperlipidemia-continue Crestor 10 mg daily continue to work with diet exercise and weight loss ? ?BMI 36.47-this is about the same as 6 months ago.  Continue to work on diet exercise and weight loss. ? ?Chronic kidney disease stage IIIa-stable and followed by [facility] ? ?Plan: Continue to work with diet exercise and weight loss and continue current medications.  Follow-up October 2023 for Medicare wellness and health maintenance exam. ? ?

## 2021-05-31 DIAGNOSIS — R350 Frequency of micturition: Secondary | ICD-10-CM | POA: Diagnosis not present

## 2021-06-12 NOTE — Patient Instructions (Addendum)
It was a pleasure to see you today.  Continue to work on diet, exercise and weight loss.  Continue current medications and follow-up in October 2023 for Medicare wellness and health maintenance exam along with fasting labs. ?

## 2021-06-16 ENCOUNTER — Ambulatory Visit (INDEPENDENT_AMBULATORY_CARE_PROVIDER_SITE_OTHER): Payer: Medicare Other

## 2021-06-16 ENCOUNTER — Ambulatory Visit (INDEPENDENT_AMBULATORY_CARE_PROVIDER_SITE_OTHER): Payer: Medicare Other | Admitting: Physician Assistant

## 2021-06-16 ENCOUNTER — Encounter: Payer: Self-pay | Admitting: Physician Assistant

## 2021-06-16 DIAGNOSIS — M25561 Pain in right knee: Secondary | ICD-10-CM

## 2021-06-16 MED ORDER — LIDOCAINE HCL 1 % IJ SOLN
3.0000 mL | INTRAMUSCULAR | Status: AC | PRN
  Administered 2021-06-16: 3 mL

## 2021-06-16 MED ORDER — METHYLPREDNISOLONE ACETATE 40 MG/ML IJ SUSP
40.0000 mg | INTRAMUSCULAR | Status: AC | PRN
  Administered 2021-06-16: 40 mg via INTRA_ARTICULAR

## 2021-06-16 NOTE — Progress Notes (Signed)
? ?Office Visit Note ?  ?Patient: Kelly [name]           ?Date of Birth: 1946/01/24           ?MRN: [id number] ?Visit Date: 06/16/2021 ?             ?Requested by: Elby Showers, MD ?[address] ?[place],  [place] [address] ?PCP: Elby Showers, MD ? ? ?Assessment & Plan: ?Visit Diagnoses:  ?1. Acute pain of right knee   ? ? ?Plan: Discussed with patient findings on radiographs.  Offered her a one-time cortisone injection versus right knee excision of arthroplasty components right total knee arthroplasty.  She states she wants to get through the summer.  However she would like to talk about having her uni knee arthroplasty converted to a total knee arthroplasty in the fall.  Therefore we will see her back in late July early August to discuss total knee replacement.  Questions were encouraged and answered at length today. ? ?Follow-Up Instructions: Return in about 12 weeks (around 09/08/2021).  ? ?Orders:  ?Orders Placed This Encounter  ?Procedures  ? Large Joint Inj  ? XR Knee 1-2 Views Right  ? ?No orders of the defined types were placed in this encounter. ? ? ? ? Procedures: ?Large Joint Inj: R knee on 06/16/2021 3:46 PM ?Indications: pain ?Details: 22 G 1.5 in needle, anterolateral approach ? ?Arthrogram: No ? ?Medications: 3 mL lidocaine 1 %; 40 mg methylPREDNISolone acetate 40 MG/ML ?Outcome: tolerated well, no immediate complications ?Procedure, treatment alternatives, risks and benefits explained, specific risks discussed. Consent was given by the patient. Immediately prior to procedure a time out was called to verify the correct patient, procedure, equipment, support staff and site/side marked as required. Patient was prepped and draped in the usual sterile fashion.  ? ? ? ? ?Clinical Data: ?No additional findings. ? ? ?Subjective: ?Chief Complaint  ?Patient presents with  ? Right Knee - Pain  ? ? ?HPI ?[name] is well-known to Dr. Ninfa Linden service comes in today with right knee pain for the  past 2 weeks.  She is status post right knee given knee arthroplasty 10/02/2014.  She is done well with the knee having occasional pain but 2 weeks ago she was pushing the sofa and heard a pop in the knee.  She is tried CBD cream on the knee which seemed to help some.  She is having pain in the medial lateral aspect of the knee and problems going up and down stairs.  She continues to workout at the gym.  States that left total knee arthroplasty is doing well this was performed in 2013.  She denies any fevers chills.  She is borderline diabetic. ? ?Review of Systems  ?Constitutional:  Negative for chills and fever.  ? ? ?Objective: ?Vital Signs: There were no vitals taken for this visit. ? ?Physical Exam ?Constitutional:   ?   Appearance: She is not ill-appearing or diaphoretic.  ?Pulmonary:  ?   Effort: Pulmonary effort is normal.  ?Neurological:  ?   Mental Status: She is alert and oriented to person, place, and time.  ?Psychiatric:     ?   Mood and Affect: Mood normal.  ? ? ?Ortho Exam ?Right knee full extension flexion to approximately 110 degrees.  No instability valgus varus stressing.  Tenderness along medial lateral joint line.  Slight patellofemoral crepitus with passive range of motion.  No abnormal warmth erythema or edema. ?Specialty Comments:  ?No specialty  comments available. ? ?Imaging: ?XR Knee 1-2 Views Right ? ?Result Date: 06/16/2021 ?Radiographs right knee 2 views: No acute fractures.  You knee arthroplasty components well-seated no hardware failure.  Narrowing lateral joint line with osteophytes about the periphery of consistent with moderate arthritis.  Moderate to severe patellofemoral changes.  ? ? ?PMFS History: ?Patient Active Problem List  ? Diagnosis Date Noted  ? Chronic left-sided low back pain with left-sided sciatica 12/17/2019  ? Diabetes mellitus type 2 in obese ([place]) 12/13/2019  ? Chronic rhinitis 03/03/2019  ? Constipation 10/31/2016  ? Impaired glucose tolerance 10/31/2016  ? Stage  III chronic kidney disease ([place]) 10/31/2016  ? Urge urinary incontinence 10/31/2016  ? Status post right partial knee replacement 10/02/2014  ? Anxiety and depression 11/06/2011  ? Hypertension 10/13/2010  ? Hyperlipidemia 10/13/2010  ? Osteoarthritis 10/13/2010  ? UNSPECIFIED IRON DEFICIENCY ANEMIA 04/11/2010  ? DIARRHEA 04/11/2010  ? ?Past Medical History:  ?Diagnosis Date  ? Cataract   ? Depression   ? Hyperlipidemia   ? Hypertension   ? Osteoporosis   ?  ?Family History  ?Problem Relation Age of Onset  ? Heart disease Mother   ? Alcohol abuse Father   ? Hypertension Father   ? Cancer Sister   ? Hypertension Sister   ? Breast cancer Neg Hx   ? Colon cancer Neg Hx   ? Colon polyps Neg Hx   ? Esophageal cancer Neg Hx   ? Stomach cancer Neg Hx   ? Rectal cancer Neg Hx   ?  ?Past Surgical History:  ?Procedure Laterality Date  ? ABDOMINAL HYSTERECTOMY    ? BREAST REDUCTION SURGERY    ? and lift  ? BREAST REDUCTION SURGERY Bilateral   ? CARPAL TUNNEL RELEASE  06/2009  ? right  ? CATARACT EXTRACTION    ? CHOLECYSTECTOMY    ? FACIAL COSMETIC SURGERY  03/2008  ? KNEE ARTHROSCOPY  2007  ? left & right  ? LIPOSUCTION EXTREMITIES    ? thighs  ? PARTIAL KNEE ARTHROPLASTY Right 10/02/2014  ? Procedure: RIGHT KNEE MEDIAL UNICOMPARTMENTAL ARTHROPLASTY;  Surgeon: Mcarthur Rossetti, MD;  Location: [facility];  Service: Orthopedics;  Laterality: Right;  ? REDUCTION MAMMAPLASTY    ? bilateral  ? TOTAL KNEE REVISION  10/12/2011  ? Procedure: TOTAL KNEE REVISION;  Surgeon: Mcarthur Rossetti, MD;  Location: [facility];  Service: Orthopedics;  Laterality: Left;  Left Total Knee Revision Arthroplasty  ? TRIGGER FINGER RELEASE  06/2009  ? right 4th finger  ? ?Social History  ? ?Occupational History  ? Not on file  ?Tobacco Use  ? Smoking status: Never  ? Smokeless tobacco: Never  ?Vaping Use  ? Vaping Use: Never used  ?Substance and Sexual Activity  ? Alcohol use: Yes  ?  Alcohol/week: 14.0 standard drinks  ?  Types: 14 Glasses of wine  per week  ?  Comment: daily 1- 2  ? Drug use: No  ? Sexual activity: Not on file  ? ? ? ? ? ? ?

## 2021-06-24 DIAGNOSIS — R350 Frequency of micturition: Secondary | ICD-10-CM | POA: Diagnosis not present

## 2021-07-21 DIAGNOSIS — H35372 Puckering of macula, left eye: Secondary | ICD-10-CM | POA: Diagnosis not present

## 2021-07-21 DIAGNOSIS — H43391 Other vitreous opacities, right eye: Secondary | ICD-10-CM | POA: Diagnosis not present

## 2021-07-21 DIAGNOSIS — H35352 Cystoid macular degeneration, left eye: Secondary | ICD-10-CM | POA: Diagnosis not present

## 2021-07-21 DIAGNOSIS — H524 Presbyopia: Secondary | ICD-10-CM | POA: Diagnosis not present

## 2021-07-26 DIAGNOSIS — R350 Frequency of micturition: Secondary | ICD-10-CM | POA: Diagnosis not present

## 2021-07-27 DIAGNOSIS — W57XXXA Bitten or stung by nonvenomous insect and other nonvenomous arthropods, initial encounter: Secondary | ICD-10-CM | POA: Diagnosis not present

## 2021-07-27 DIAGNOSIS — L03115 Cellulitis of right lower limb: Secondary | ICD-10-CM | POA: Diagnosis not present

## 2021-08-11 DIAGNOSIS — M5414 Radiculopathy, thoracic region: Secondary | ICD-10-CM | POA: Diagnosis not present

## 2021-08-11 DIAGNOSIS — M9902 Segmental and somatic dysfunction of thoracic region: Secondary | ICD-10-CM | POA: Diagnosis not present

## 2021-08-11 DIAGNOSIS — M5032 Other cervical disc degeneration, mid-cervical region, unspecified level: Secondary | ICD-10-CM | POA: Diagnosis not present

## 2021-08-11 DIAGNOSIS — M9901 Segmental and somatic dysfunction of cervical region: Secondary | ICD-10-CM | POA: Diagnosis not present

## 2021-08-17 ENCOUNTER — Other Ambulatory Visit: Payer: Self-pay | Admitting: Internal Medicine

## 2021-08-17 DIAGNOSIS — Z1231 Encounter for screening mammogram for malignant neoplasm of breast: Secondary | ICD-10-CM

## 2021-08-23 DIAGNOSIS — R350 Frequency of micturition: Secondary | ICD-10-CM | POA: Diagnosis not present

## 2021-08-30 ENCOUNTER — Other Ambulatory Visit: Payer: Self-pay | Admitting: Internal Medicine

## 2021-09-07 DIAGNOSIS — M5032 Other cervical disc degeneration, mid-cervical region, unspecified level: Secondary | ICD-10-CM | POA: Diagnosis not present

## 2021-09-07 DIAGNOSIS — M5414 Radiculopathy, thoracic region: Secondary | ICD-10-CM | POA: Diagnosis not present

## 2021-09-07 DIAGNOSIS — M9901 Segmental and somatic dysfunction of cervical region: Secondary | ICD-10-CM | POA: Diagnosis not present

## 2021-09-07 DIAGNOSIS — M9902 Segmental and somatic dysfunction of thoracic region: Secondary | ICD-10-CM | POA: Diagnosis not present

## 2021-09-12 ENCOUNTER — Ambulatory Visit (INDEPENDENT_AMBULATORY_CARE_PROVIDER_SITE_OTHER): Payer: Medicare Other | Admitting: Physician Assistant

## 2021-09-12 ENCOUNTER — Encounter: Payer: Self-pay | Admitting: Physician Assistant

## 2021-09-12 VITALS — Ht 61.0 in | Wt 193.8 lb

## 2021-09-12 DIAGNOSIS — M1711 Unilateral primary osteoarthritis, right knee: Secondary | ICD-10-CM | POA: Diagnosis not present

## 2021-09-12 NOTE — Progress Notes (Signed)
HPI: Kelly [name] comes in to discuss her right knee.  She underwent a right partial knee medial arthroplasty in 2016.  However she has developed pain in the knee since that time.  She states that the cortisone injection she received an injection on 06/16/2021 did help.  She feels that the knee felt good for at least 2 weeks after the injection.  Since then she has been wearing a knee brace which is helping using CBD ointment and Omega XL.  She also finds water aerobics to be beneficial.  She is having pain medial aspect of the knee.  She denies any mechanical symptoms in the knee.  She has gone on to develop lateral and patellofemoral arthritis since undergoing the arthroplasty of the right knee in 2016.  Review of systems: Denies fevers, chills, shortness of breath or chest pain.  Physical exam: General well-developed well-nourished female no acute distress mood and affect appropriate.  Psych: Alert and oriented x3  Right knee good range of motion of the knee.  She has tenderness along medial lateral joint line.  No gross instability valgus varus stressing.  Well-healed surgical incision over the anterior aspect of the right knee.  No abnormal warmth or effusion in the knee.  Impression: Status post right knee medial partial arthroplasty Right knee osteoarthritis  Plan: Given that the patient has developed osteoarthritis along the lateral compartment and the patellofemoral compartment is having significant pain in the knee is tight in quality of life recommend conversion of the right partial knee to a right total knee arthroplasty.  Risk benefits of surgery discussed with patient at length.  She understands the risks and benefits of surgery to include but are not limited to wound healing problems,, nerve vessel injury, DVT/PE, blood loss, prolonged pain and worsening pain.  She would like to schedule this surgery for sometime around the fourth week of October of this year.  We will work on scheduling  her.  We will see her back 2 weeks postop.

## 2021-09-15 DIAGNOSIS — M9901 Segmental and somatic dysfunction of cervical region: Secondary | ICD-10-CM | POA: Diagnosis not present

## 2021-09-15 DIAGNOSIS — M9902 Segmental and somatic dysfunction of thoracic region: Secondary | ICD-10-CM | POA: Diagnosis not present

## 2021-09-15 DIAGNOSIS — M5414 Radiculopathy, thoracic region: Secondary | ICD-10-CM | POA: Diagnosis not present

## 2021-09-15 DIAGNOSIS — M5032 Other cervical disc degeneration, mid-cervical region, unspecified level: Secondary | ICD-10-CM | POA: Diagnosis not present

## 2021-09-20 DIAGNOSIS — R350 Frequency of micturition: Secondary | ICD-10-CM | POA: Diagnosis not present

## 2021-09-22 DIAGNOSIS — M5032 Other cervical disc degeneration, mid-cervical region, unspecified level: Secondary | ICD-10-CM | POA: Diagnosis not present

## 2021-09-22 DIAGNOSIS — M5414 Radiculopathy, thoracic region: Secondary | ICD-10-CM | POA: Diagnosis not present

## 2021-09-22 DIAGNOSIS — M9902 Segmental and somatic dysfunction of thoracic region: Secondary | ICD-10-CM | POA: Diagnosis not present

## 2021-09-22 DIAGNOSIS — M9901 Segmental and somatic dysfunction of cervical region: Secondary | ICD-10-CM | POA: Diagnosis not present

## 2021-09-26 ENCOUNTER — Ambulatory Visit
Admission: RE | Admit: 2021-09-26 | Discharge: 2021-09-26 | Disposition: A | Discharge status: Home / Self Care | Payer: Medicare Other | Source: Ambulatory Visit | Attending: Internal Medicine | Admitting: Internal Medicine

## 2021-09-26 DIAGNOSIS — Z1231 Encounter for screening mammogram for malignant neoplasm of breast: Secondary | ICD-10-CM | POA: Diagnosis not present

## 2021-09-26 DIAGNOSIS — N1831 Chronic kidney disease, stage 3a: Secondary | ICD-10-CM | POA: Diagnosis not present

## 2021-10-03 DIAGNOSIS — I129 Hypertensive chronic kidney disease with stage 1 through stage 4 chronic kidney disease, or unspecified chronic kidney disease: Secondary | ICD-10-CM | POA: Diagnosis not present

## 2021-10-03 DIAGNOSIS — E785 Hyperlipidemia, unspecified: Secondary | ICD-10-CM | POA: Diagnosis not present

## 2021-10-03 DIAGNOSIS — D631 Anemia in chronic kidney disease: Secondary | ICD-10-CM | POA: Diagnosis not present

## 2021-10-03 DIAGNOSIS — N2581 Secondary hyperparathyroidism of renal origin: Secondary | ICD-10-CM | POA: Diagnosis not present

## 2021-10-03 DIAGNOSIS — N1831 Chronic kidney disease, stage 3a: Secondary | ICD-10-CM | POA: Diagnosis not present

## 2021-10-06 DIAGNOSIS — M5032 Other cervical disc degeneration, mid-cervical region, unspecified level: Secondary | ICD-10-CM | POA: Diagnosis not present

## 2021-10-06 DIAGNOSIS — M9902 Segmental and somatic dysfunction of thoracic region: Secondary | ICD-10-CM | POA: Diagnosis not present

## 2021-10-06 DIAGNOSIS — M5414 Radiculopathy, thoracic region: Secondary | ICD-10-CM | POA: Diagnosis not present

## 2021-10-06 DIAGNOSIS — M9901 Segmental and somatic dysfunction of cervical region: Secondary | ICD-10-CM | POA: Diagnosis not present

## 2021-10-18 DIAGNOSIS — R350 Frequency of micturition: Secondary | ICD-10-CM | POA: Diagnosis not present

## 2021-10-19 DIAGNOSIS — M5032 Other cervical disc degeneration, mid-cervical region, unspecified level: Secondary | ICD-10-CM | POA: Diagnosis not present

## 2021-10-19 DIAGNOSIS — M5414 Radiculopathy, thoracic region: Secondary | ICD-10-CM | POA: Diagnosis not present

## 2021-10-19 DIAGNOSIS — M9902 Segmental and somatic dysfunction of thoracic region: Secondary | ICD-10-CM | POA: Diagnosis not present

## 2021-10-19 DIAGNOSIS — M9901 Segmental and somatic dysfunction of cervical region: Secondary | ICD-10-CM | POA: Diagnosis not present

## 2021-11-01 DIAGNOSIS — M9901 Segmental and somatic dysfunction of cervical region: Secondary | ICD-10-CM | POA: Diagnosis not present

## 2021-11-01 DIAGNOSIS — M5414 Radiculopathy, thoracic region: Secondary | ICD-10-CM | POA: Diagnosis not present

## 2021-11-01 DIAGNOSIS — M9902 Segmental and somatic dysfunction of thoracic region: Secondary | ICD-10-CM | POA: Diagnosis not present

## 2021-11-01 DIAGNOSIS — M5032 Other cervical disc degeneration, mid-cervical region, unspecified level: Secondary | ICD-10-CM | POA: Diagnosis not present

## 2021-11-15 DIAGNOSIS — R350 Frequency of micturition: Secondary | ICD-10-CM | POA: Diagnosis not present

## 2021-11-24 ENCOUNTER — Other Ambulatory Visit: Payer: Federal, State, Local not specified - PPO

## 2021-11-24 NOTE — Progress Notes (Signed)
Sent message, via epic in basket, requesting orders in epic from surgeon.  

## 2021-11-25 ENCOUNTER — Other Ambulatory Visit: Payer: Medicare Other

## 2021-11-25 DIAGNOSIS — E785 Hyperlipidemia, unspecified: Secondary | ICD-10-CM

## 2021-11-25 DIAGNOSIS — I10 Essential (primary) hypertension: Secondary | ICD-10-CM

## 2021-11-25 DIAGNOSIS — E669 Obesity, unspecified: Secondary | ICD-10-CM | POA: Diagnosis not present

## 2021-11-25 DIAGNOSIS — R5383 Other fatigue: Secondary | ICD-10-CM

## 2021-11-25 DIAGNOSIS — F32A Depression, unspecified: Secondary | ICD-10-CM | POA: Diagnosis not present

## 2021-11-25 DIAGNOSIS — R7302 Impaired glucose tolerance (oral): Secondary | ICD-10-CM

## 2021-11-25 DIAGNOSIS — F419 Anxiety disorder, unspecified: Secondary | ICD-10-CM | POA: Diagnosis not present

## 2021-11-25 DIAGNOSIS — E1169 Type 2 diabetes mellitus with other specified complication: Secondary | ICD-10-CM | POA: Diagnosis not present

## 2021-11-25 NOTE — Patient Instructions (Signed)
DUE TO COVID-19 ONLY TWO VISITORS  (aged 78 and older)  ARE ALLOWED TO COME WITH YOU AND STAY IN THE WAITING ROOM ONLY DURING PRE OP AND PROCEDURE.   **NO VISITORS ARE ALLOWED IN THE SHORT STAY AREA OR RECOVERY ROOM!!**  IF YOU WILL BE ADMITTED INTO THE HOSPITAL YOU ARE ALLOWED ONLY FOUR SUPPORT PEOPLE DURING VISITATION HOURS ONLY (7 AM -8PM)   The support person(s) must pass our screening, gel in and out, and wear a mask at all times, including in the patient's room. Patients must also wear a mask when staff or their support person are in the room. Visitors GUEST BADGE MUST BE WORN VISIBLY  One adult visitor may remain with you overnight and MUST be in the room by 8 P.M.     Your procedure is scheduled on: 12/09/21   Report to [facility] Main Entrance    Report to admitting at : : 10:45 AM   Call this number if you have problems the morning of surgery [phone]   Do not eat food :After Midnight.   After Midnight you may have the following liquids until : 10:00 AM DAY OF SURGERY  Water Black Coffee (sugar ok, NO MILK/CREAM OR CREAMERS)  Tea (sugar ok, NO MILK/CREAM OR CREAMERS) regular and decaf                             Plain Jell-O (NO RED)                                           Fruit ices (not with fruit pulp, NO RED)                                     Popsicles (NO RED)                                                                  Juice: apple, WHITE grape, WHITE cranberry Sports drinks like Gatorade (NO RED)              Drink Ensure drink AT : 10:00 AM the day of surgery.     The day of surgery:  Drink ONE (1) Pre-Surgery Clear Ensure or G2 at AM the morning of surgery. Drink in one sitting. Do not sip.  This drink was given to you during your hospital  pre-op appointment visit. Nothing else to drink after completing the  Pre-Surgery Clear Ensure or G2.          If you have questions, please contact your surgeon's office.    Oral Hygiene is also  important to reduce your risk of infection.                                    Remember - BRUSH YOUR TEETH THE MORNING OF SURGERY WITH YOUR REGULAR TOOTHPASTE   Do NOT smoke after Midnight   Take these medicines the morning of surgery with  A SIP OF WATER:amlodipine.                    You may not have any metal on your body including hair pins, jewelry, and body piercing             Do not wear make-up, lotions, powders, perfumes/cologne, or deodorant  Do not wear nail polish including gel and S&S, artificial/acrylic nails, or any other type of covering on natural nails including finger and toenails. If you have artificial nails, gel coating, etc. that needs to be removed by a nail salon please have this removed prior to surgery or surgery may need to be canceled/ delayed if the surgeon/ anesthesia feels like they are unable to be safely monitored.   Do not shave  48 hours prior to surgery.    Do not bring valuables to the hospital. [place].   Contacts, dentures or bridgework may not be worn into surgery.   Bring small overnight bag day of surgery.   DO NOT [place]. PHARMACY WILL DISPENSE MEDICATIONS LISTED ON YOUR MEDICATION LIST TO YOU DURING YOUR ADMISSION [place]!    Patients discharged on the day of surgery will not be allowed to drive home.  Someone NEEDS to stay with you for the first 24 hours after anesthesia.   Special Instructions: Bring a copy of your healthcare power of attorney and living will documents         the day of surgery if you haven't scanned them before.              Please read over the following fact sheets you were given: IF YOU HAVE QUESTIONS ABOUT YOUR PRE-OP INSTRUCTIONS PLEASE CALL [phone]     [facility] - Preparing for Surgery Before surgery, you can play an important role.  Because skin is not sterile, your skin needs to be as free of germs as possible.   You can reduce the number of germs on your skin by washing with CHG (chlorahexidine gluconate) soap before surgery.  CHG is an antiseptic cleaner which kills germs and bonds with the skin to continue killing germs even after washing. Please DO NOT use if you have an allergy to CHG or antibacterial soaps.  If your skin becomes reddened/irritated stop using the CHG and inform your nurse when you arrive at Short Stay. Do not shave (including legs and underarms) for at least 48 hours prior to the first CHG shower.  You may shave your face/neck. Please follow these instructions carefully:  1.  Shower with CHG Soap the night before surgery and the  morning of Surgery.  2.  If you choose to wash your hair, wash your hair first as usual with your  normal  shampoo.  3.  After you shampoo, rinse your hair and body thoroughly to remove the  shampoo.                           4.  Use CHG as you would any other liquid soap.  You can apply chg directly  to the skin and wash                       Gently with a scrungie or clean washcloth.  5.  Apply the  CHG Soap to your body ONLY FROM THE NECK DOWN.   Do not use on face/ open                           Wound or open sores. Avoid contact with eyes, ears mouth and genitals (private parts).                       Wash face,  Genitals (private parts) with your normal soap.             6.  Wash thoroughly, paying special attention to the area where your surgery  will be performed.  7.  Thoroughly rinse your body with warm water from the neck down.  8.  DO NOT shower/wash with your normal soap after using and rinsing off  the CHG Soap.                9.  Pat yourself dry with a clean towel.            10.  Wear clean pajamas.            11.  Place clean sheets on your bed the night of your first shower and do not  sleep with pets. Day of Surgery : Do not apply any lotions/deodorants the morning of surgery.  Please wear clean clothes to the hospital/surgery  center.  FAILURE TO FOLLOW THESE INSTRUCTIONS MAY RESULT IN THE CANCELLATION OF YOUR SURGERY PATIENT SIGNATURE_________________________________  NURSE SIGNATURE__________________________________  ________________________________________________________________________    ________________________________________________________________________

## 2021-11-26 LAB — HEMOGLOBIN A1C
Hgb A1c MFr Bld: 5.8 % of total Hgb — ABNORMAL HIGH (ref <5.7)
Mean Plasma Glucose: 120 mg/dL
eAG (mmol/L): 6.6 mmol/L

## 2021-11-26 LAB — COMPLETE METABOLIC PANEL WITH GFR
AG Ratio: 1.5 (calc) (ref 1.0–2.5)
ALT: 17 U/L (ref 6–29)
AST: 24 U/L (ref 10–35)
Albumin: 4.1 g/dL (ref 3.6–5.1)
Alkaline phosphatase (APISO): 109 U/L (ref 37–153)
BUN/Creatinine Ratio: 13 (calc) (ref 6–22)
BUN: 18 mg/dL (ref 7–25)
CO2: 30 mmol/L (ref 20–32)
Calcium: 10.2 mg/dL (ref 8.6–10.4)
Chloride: 105 mmol/L (ref 98–110)
Creat: 1.34 mg/dL — ABNORMAL HIGH (ref 0.60–1.00)
Globulin: 2.7 g/dL (calc) (ref 1.9–3.7)
Glucose, Bld: 97 mg/dL (ref 65–99)
Potassium: 4.7 mmol/L (ref 3.5–5.3)
Sodium: 142 mmol/L (ref 135–146)
Total Bilirubin: 0.7 mg/dL (ref 0.2–1.2)
Total Protein: 6.8 g/dL (ref 6.1–8.1)
eGFR: 41 mL/min/{1.73_m2} — ABNORMAL LOW (ref >=60)

## 2021-11-26 LAB — CBC WITH DIFFERENTIAL/PLATELET
Absolute Monocytes: 258 cells/uL (ref 200–950)
Basophils Absolute: 9 cells/uL (ref 0–200)
Basophils Relative: 0.2 %
Eosinophils Absolute: 41 cells/uL (ref 15–500)
Eosinophils Relative: 0.9 %
HCT: 41.3 % (ref 35.0–45.0)
Hemoglobin: 13.5 g/dL (ref 11.7–15.5)
Lymphs Abs: 1546 cells/uL (ref 850–3900)
MCH: 29.3 pg (ref 27.0–33.0)
MCHC: 32.7 g/dL (ref 32.0–36.0)
MCV: 89.8 fL (ref 80.0–100.0)
MPV: 11.5 fL (ref 7.5–12.5)
Monocytes Relative: 5.6 %
Neutro Abs: 2746 cells/uL (ref 1500–7800)
Neutrophils Relative %: 59.7 %
Platelets: 193 10*3/uL (ref 140–400)
RBC: 4.6 10*6/uL (ref 3.80–5.10)
RDW: 13.5 % (ref 11.0–15.0)
Total Lymphocyte: 33.6 %
WBC: 4.6 10*3/uL (ref 3.8–10.8)

## 2021-11-26 LAB — LIPID PANEL
Cholesterol: 171 mg/dL (ref <200)
HDL: 40 mg/dL — ABNORMAL LOW (ref >=50)
LDL Cholesterol (Calc): 102 mg/dL (calc) — ABNORMAL HIGH
Non-HDL Cholesterol (Calc): 131 mg/dL (calc) — ABNORMAL HIGH (ref <130)
Total CHOL/HDL Ratio: 4.3 (calc) (ref <5.0)
Triglycerides: 170 mg/dL — ABNORMAL HIGH (ref <150)

## 2021-11-26 LAB — TSH: TSH: 2.92 mIU/L (ref 0.40–4.50)

## 2021-11-28 ENCOUNTER — Encounter (HOSPITAL_COMMUNITY)
Admission: RE | Admit: 2021-11-28 | Discharge: 2021-11-28 | Disposition: A | Discharge status: Home / Self Care | Payer: Medicare Other | Source: Ambulatory Visit | Attending: Orthopaedic Surgery | Admitting: Orthopaedic Surgery

## 2021-11-28 ENCOUNTER — Ambulatory Visit: Payer: Federal, State, Local not specified - PPO | Admitting: Internal Medicine

## 2021-11-28 ENCOUNTER — Other Ambulatory Visit: Payer: Self-pay

## 2021-11-28 ENCOUNTER — Encounter (HOSPITAL_COMMUNITY): Payer: Self-pay

## 2021-11-28 VITALS — BP 155/94 | HR 67 | Temp 97.7°F | Ht 61.0 in | Wt 192.0 lb

## 2021-11-28 DIAGNOSIS — E1169 Type 2 diabetes mellitus with other specified complication: Secondary | ICD-10-CM | POA: Diagnosis not present

## 2021-11-28 DIAGNOSIS — G8929 Other chronic pain: Secondary | ICD-10-CM

## 2021-11-28 DIAGNOSIS — E669 Obesity, unspecified: Secondary | ICD-10-CM | POA: Insufficient documentation

## 2021-11-28 DIAGNOSIS — Z01818 Encounter for other preprocedural examination: Secondary | ICD-10-CM | POA: Diagnosis not present

## 2021-11-28 DIAGNOSIS — I10 Essential (primary) hypertension: Secondary | ICD-10-CM | POA: Insufficient documentation

## 2021-11-28 HISTORY — DX: Chronic kidney disease, unspecified: N18.9

## 2021-11-28 LAB — SURGICAL PCR SCREEN
MRSA, PCR: NEGATIVE
Staphylococcus aureus: NEGATIVE

## 2021-11-28 NOTE — Progress Notes (Signed)
For Short Stay: [place] appointment date: Date of COVID positive in last 8 days:  Bowel Prep reminder:   For Anesthesia: PCP - Dr. Tedra Senegal Cardiologist -   Chest x-ray -  EKG -  Stress Test -  ECHO -  Cardiac Cath -  Pacemaker/ICD device last checked: Pacemaker orders received: Device Rep notified:  Spinal Cord Stimulator:  Sleep Study -  CPAP -   Fasting Blood Sugar -  Checks Blood Sugar _____ times a day Date and result of last Hgb A1c-5.8: 11/25/21  Blood Thinner Instructions: Aspirin Instructions: Last Dose:  Activity level: Can go up a flight of stairs and activities of daily living without stopping and without chest pain and/or shortness of breath   Able to exercise without chest pain and/or shortness of breath   Unable to go up a flight of stairs without chest pain and/or shortness of breath     Anesthesia review: Hx: HTN  Patient denies shortness of breath, fever, cough and chest pain at PAT appointment   Patient verbalized understanding of instructions that were given to them at the PAT appointment. Patient was also instructed that they will need to review over the PAT instructions again at home before surgery.

## 2021-11-29 ENCOUNTER — Other Ambulatory Visit: Payer: Self-pay | Admitting: Physician Assistant

## 2021-11-29 DIAGNOSIS — M9901 Segmental and somatic dysfunction of cervical region: Secondary | ICD-10-CM | POA: Diagnosis not present

## 2021-11-29 DIAGNOSIS — M5032 Other cervical disc degeneration, mid-cervical region, unspecified level: Secondary | ICD-10-CM | POA: Diagnosis not present

## 2021-11-29 DIAGNOSIS — M5414 Radiculopathy, thoracic region: Secondary | ICD-10-CM | POA: Diagnosis not present

## 2021-11-29 DIAGNOSIS — M9902 Segmental and somatic dysfunction of thoracic region: Secondary | ICD-10-CM | POA: Diagnosis not present

## 2021-11-30 ENCOUNTER — Ambulatory Visit (INDEPENDENT_AMBULATORY_CARE_PROVIDER_SITE_OTHER): Payer: Medicare Other

## 2021-11-30 VITALS — BP 169/83 | HR 60 | Temp 97.1°F

## 2021-11-30 DIAGNOSIS — Z0000 Encounter for general adult medical examination without abnormal findings: Secondary | ICD-10-CM

## 2021-11-30 NOTE — Progress Notes (Addendum)
Subjective:   Kelly [name] is a 76 y.o. female who presents for Medicare Annual (Subsequent) preventive examination.         Objective:    Today's Vitals   11/30/21 1403  BP: (!) 169/83  Pulse: 60  Temp: (!) 97.1 F (36.2 C)  TempSrc: Oral  SpO2: 97%   There is no height or weight on file to calculate BMI.     11/30/2021    2:08 PM 11/28/2021    2:18 PM 11/22/2020   10:07 AM 09/28/2020    8:45 AM 10/02/2014    3:00 PM 09/24/2014    2:48 PM 10/12/2011    6:42 PM  Advanced Directives  Does Patient Have a Medical Advance Directive? Yes Yes Yes Yes Yes Yes Patient has advance directive, copy not in chart  Type of Advance Directive [place];Living will Living will;Healthcare Power of [place];Living will Living will;Healthcare Power of [place];Living will [place];Living will [place];Living will  Does patient want to make changes to medical advance directive? No - Patient declined   No - Patient declined No - Patient declined No - Patient declined   Copy of [place] in Chart? No - copy requested  No - copy requested No - copy requested No - copy requested No - copy requested Copy requested from family  Pre-existing out of facility DNR order (yellow form or pink MOST form)       No    Current Medications (verified) Outpatient Encounter Medications as of 11/30/2021  Medication Sig   amLODipine (NORVASC) 5 MG tablet TAKE 1 TABLET BY MOUTH EVERY DAY   cholecalciferol (VITAMIN D3) 25 MCG (1000 UNIT) tablet Take 1,000 Units by mouth daily.   DHA-EPA-Vitamin E (OMEGA-3 COMPLEX PO) Take 1 capsule by mouth daily.   Ferrous Sulfate (IRON PO) Take 200 mg by mouth daily.   rosuvastatin (CRESTOR) 10 MG tablet TAKE 1 TABLET(10 MG) BY MOUTH DAILY   vitamin B-12 (CYANOCOBALAMIN) 500 MCG tablet Take 500 mcg by mouth daily.   No facility-administered  encounter medications on file as of 11/30/2021.    Allergies (verified) Patient has no known allergies.   History: Past Medical History:  Diagnosis Date   Arthritis    Cataract    Chronic kidney disease    Depression    Hyperlipidemia    Hypertension    Osteoporosis    Past Surgical History:  Procedure Laterality Date   ABDOMINAL HYSTERECTOMY     BREAST REDUCTION SURGERY     and lift   BREAST REDUCTION SURGERY Bilateral    CARPAL TUNNEL RELEASE  06/2009   right   CATARACT EXTRACTION     CHOLECYSTECTOMY     FACIAL COSMETIC SURGERY  03/2008   KNEE ARTHROSCOPY  2007   left & right   LIPOSUCTION EXTREMITIES     thighs   PARTIAL KNEE ARTHROPLASTY Right 10/02/2014   Procedure: RIGHT KNEE MEDIAL UNICOMPARTMENTAL ARTHROPLASTY;  Surgeon: Mcarthur Rossetti, MD;  Location: [facility];  Service: Orthopedics;  Laterality: Right;   REDUCTION MAMMAPLASTY     bilateral   TOTAL KNEE REVISION  10/12/2011   Procedure: TOTAL KNEE REVISION;  Surgeon: Mcarthur Rossetti, MD;  Location: [facility];  Service: Orthopedics;  Laterality: Left;  Left Total Knee Revision Arthroplasty   TRIGGER FINGER RELEASE  06/2009   right 4th finger   Family History  Problem Relation Age  of Onset   Heart disease Mother    Alcohol abuse Father    Hypertension Father    Cancer Sister    Hypertension Sister    Breast cancer Neg Hx    Colon cancer Neg Hx    Colon polyps Neg Hx    Esophageal cancer Neg Hx    Stomach cancer Neg Hx    Rectal cancer Neg Hx    Social History   Socioeconomic History   Marital status: Divorced    Spouse name: Not on file   Number of children: Not on file   Years of education: Not on file   Highest education level: Not on file  Occupational History   Not on file  Tobacco Use   Smoking status: Never   Smokeless tobacco: Never  Vaping Use   Vaping Use: Never used  Substance and Sexual Activity   Alcohol use: Yes    Alcohol/week: 1.0 standard drink of alcohol     Types: 1 Glasses of wine per week    Comment: daily 1- 2   Drug use: No   Sexual activity: Not on file  Other Topics Concern   Not on file  Social History Narrative   Not on file   Social Determinants of Health   Financial Resource Strain: Low Risk  (11/30/2021)   Overall Financial Resource Strain (CARDIA)    Difficulty of Paying Living Expenses: Not hard at all  Food Insecurity: No Food Insecurity (11/30/2021)   Hunger Vital Sign    Worried About Running Out of Food in the Last Year: Never true    Ran Out of Food in the Last Year: Never true  Transportation Needs: No Transportation Needs (11/30/2021)   PRAPARE - Hydrologist (Medical): No    Lack of Transportation (Non-Medical): No  Physical Activity: Sufficiently Active (11/30/2021)   Exercise Vital Sign    Days of Exercise per Week: 7 days    Minutes of Exercise per Session: 80 min  Stress: No Stress Concern Present (11/30/2021)   [place]    Feeling of Stress : Not at all  Social Connections: Moderately Integrated (11/30/2021)   Social Connection and Isolation Panel [NHANES]    Frequency of Communication with Friends and Family: More than three times a week    Frequency of Social Gatherings with Friends and Family: Once a week    Attends Religious Services: More than 4 times per year    Active Member of [employer] or Organizations: No    Attends Music therapist: More than 4 times per year    Marital Status: Separated    Tobacco Counseling Counseling given: Not Answered   Clinical Intake:                 Diabetic?169/8         Activities of Daily Living    11/30/2021    2:05 PM 11/28/2021    2:21 PM  In your present state of health, do you have any difficulty performing the following activities:  Hearing? 0   Vision? 0   Difficulty concentrating or making decisions? 0   Walking or climbing  stairs? 1   Dressing or bathing? 0   Doing errands, shopping? 0 0    Patient Care Team: Baxley, Cresenciano Lick, MD as PCP - General  Indicate any recent Medical Services you may have received from other than [facility] providers in the  past year (date may be approximate).     Assessment:   This is a routine wellness examination for Kelly [name].  Hearing/Vision screen No results found.  Dietary issues and exercise activities discussed:     Goals Addressed   None   Depression Screen    11/30/2021    2:05 PM 11/22/2020   10:11 AM 11/20/2019    3:25 PM 11/18/2018    2:04 PM 11/15/2017   11:05 AM 10/31/2016   11:13 AM 02/10/2015   11:24 AM  PHQ 2/9 Scores  PHQ - 2 Score 0 0 0 0 0 0   PHQ- 9 Score    0     Exception Documentation       Patient refusal    Fall Risk    11/30/2021    2:05 PM 11/22/2020   10:11 AM 11/20/2019    3:25 PM 01/07/2019    3:38 PM 01/01/2018    1:11 PM  [place] in the past year? 0 0 0 0 0  Comment    [employer] Telephone Survey: data to providers prior to load [employer] Survey: data to providers prior to load  Number falls in past yr: 0 0 0    Injury with Fall? 0 0 0    Risk for fall due to : No Fall Risks No Fall Risks     Follow up Falls evaluation completed Falls evaluation completed Falls evaluation completed      [place]:  Any stairs in or around the home? No  If so, are there any without handrails? Yes  Home free of loose throw rugs in walkways, pet beds, electrical cords, etc? Yes  Adequate lighting in your home to reduce risk of falls? Yes   ASSISTIVE DEVICES UTILIZED TO PREVENT FALLS:  Life alert? No  Use of a cane, walker or w/c? No  Grab bars in the bathroom? Yes  Shower chair or bench in shower? No  Elevated toilet seat or a handicapped toilet? No   TIMED UP AND GO:  Was the test performed? No .  Length of time to ambulate 10 feet: N/A  Gait slow and steady without use of assistive  device  Cognitive Function:        11/30/2021    2:06 PM 11/22/2020   10:13 AM  6CIT Screen  What Year? 0 points 0 points  What month? 0 points 0 points  What time?  0 points  Count back from 20 0 points 0 points  Months in reverse 0 points 0 points  Repeat phrase 0 points 0 points  Total Score  0 points    Immunizations Immunization History  Administered Date(s) Administered   Influenza,inj,Quad PF,6+ Mos 11/26/2012, 11/25/2014, 11/19/2015, 10/31/2016, 12/10/2017, 12/06/2018, 11/22/2020   Influenza-Unspecified 12/08/2013   PFIZER(Purple Top)SARS-COV-2 Vaccination 05/15/2019, 06/09/2019, 02/25/2020   Pneumococcal Conjugate-13 09/22/2015   Pneumococcal Polysaccharide-23 04/20/2011   Tdap 04/20/2011, 12/01/2015   Zoster, Live 05/01/2007    TDAP status: Due, Education has been provided regarding the importance of this vaccine. Advised may receive this vaccine at local pharmacy or Health Dept. Aware to provide a copy of the vaccination record if obtained from local pharmacy or Health Dept. Verbalized acceptance and understanding.  Flu Vaccine status: Up to date  Pneumococcal vaccine status: Due, Education has been provided regarding the importance of this vaccine. Advised may receive this vaccine at local pharmacy or Health Dept. Aware to provide a copy of the  vaccination record if obtained from local pharmacy or Health Dept. Verbalized acceptance and understanding.  Covid-19 vaccine status: Information provided on how to obtain vaccines.   Qualifies for Shingles Vaccine? No   Zostavax completed No   Shingrix Completed?: No.    Education has been provided regarding the importance of this vaccine. Patient has been advised to call insurance company to determine out of pocket expense if they have not yet received this vaccine. Advised may also receive vaccine at local pharmacy or Health Dept. Verbalized acceptance and understanding.  Screening Tests Health Maintenance  Topic  Date Due   Hepatitis C Screening  Never done   Zoster Vaccines- Shingrix (1 of 2) Never done   COVID-19 Vaccine (4 - Pfizer series) 04/21/2020   Diabetic kidney evaluation - Urine ACR  11/17/2020   FOOT EXAM  11/19/2020   INFLUENZA VACCINE  09/13/2021   HEMOGLOBIN A1C  05/27/2022   OPHTHALMOLOGY EXAM  07/22/2022   Diabetic kidney evaluation - GFR measurement  11/26/2022   COLONOSCOPY (Pts 45-52yr Insurance coverage will need to be confirmed)  02/27/2023   TETANUS/TDAP  11/30/2025   Pneumonia Vaccine 76 Years old  Completed   DEXA SCAN  Completed   HPV VACCINES  Aged Out    Health Maintenance  Health Maintenance Due  Topic Date Due   Hepatitis C Screening  Never done   Zoster Vaccines- Shingrix (1 of 2) Never done   COVID-19 Vaccine (4 - Pfizer series) 04/21/2020   Diabetic kidney evaluation - Urine ACR  11/17/2020   FOOT EXAM  11/19/2020   INFLUENZA VACCINE  09/13/2021    Colorectal cancer screening: Type of screening: Colonoscopy. Completed 02/27/20. Repeat every 3 years  Mammogram status: Completed 3. Repeat every year    Lung Cancer Screening: (Low Dose CT Chest recommended if Age 78-80years, 30 pack-year currently smoking OR have quit w/in 15years.) does not qualify.   Lung Cancer Screening Referral: N/A  Additional Screening:  Hepatitis C Screening: does not qualify; Completed   Vision Screening: Recommended annual ophthalmology exams for early detection of glaucoma and other disorders of the eye. Is the patient up to date with their annual eye exam?  Yes  Who is the provider or what is the name of the office in which the patient attends annual eye exams?  If pt is not established with a provider, would they like to be referred to a provider to establish care? No .   Dental Screening: Recommended annual dental exams for proper oral hygiene  Community Resource Referral / Chronic Care Management: CRR required this visit?  No   CCM required this visit?  No       Plan:     I have personally reviewed and noted the following in the patient's chart:   Medical and social history Use of alcohol, tobacco or illicit drugs  Current medications and supplements including opioid prescriptions. Patient is not currently taking opioid prescriptions. Functional ability and status Nutritional status Physical activity Advanced directives List of other physicians Hospitalizations, surgeries, and ER visits in previous 12 months Vitals Screenings to include cognitive, depression, and falls Referrals and appointments  In addition, I have reviewed and discussed with patient certain preventive protocols, quality metrics, and best practice recommendations. A written personalized care plan for preventive services as well as general preventive health recommendations were provided to patient.     Kelly [name] DBarron Alvine CMA   11/30/2021     I, MElby Showers MD, have reviewed  all documentation for this visit. The documentation on 12/05/21 for the exam, diagnosis, procedures, and orders are all accurate and complete.

## 2021-12-01 ENCOUNTER — Other Ambulatory Visit: Payer: Self-pay

## 2021-12-02 ENCOUNTER — Ambulatory Visit: Payer: Medicare Other | Admitting: Internal Medicine

## 2021-12-05 ENCOUNTER — Ambulatory Visit (INDEPENDENT_AMBULATORY_CARE_PROVIDER_SITE_OTHER): Payer: Medicare Other | Admitting: Internal Medicine

## 2021-12-05 ENCOUNTER — Ambulatory Visit: Payer: Medicare Other | Admitting: Internal Medicine

## 2021-12-05 ENCOUNTER — Encounter: Payer: Self-pay | Admitting: Internal Medicine

## 2021-12-05 VITALS — BP 132/84 | HR 71 | Temp 98.2°F | Ht 59.75 in | Wt 195.8 lb

## 2021-12-05 DIAGNOSIS — R609 Edema, unspecified: Secondary | ICD-10-CM

## 2021-12-05 DIAGNOSIS — N1831 Chronic kidney disease, stage 3a: Secondary | ICD-10-CM

## 2021-12-05 DIAGNOSIS — Z7185 Encounter for immunization safety counseling: Secondary | ICD-10-CM

## 2021-12-05 DIAGNOSIS — N3941 Urge incontinence: Secondary | ICD-10-CM

## 2021-12-05 DIAGNOSIS — E7439 Other disorders of intestinal carbohydrate absorption: Secondary | ICD-10-CM | POA: Diagnosis not present

## 2021-12-05 DIAGNOSIS — I10 Essential (primary) hypertension: Secondary | ICD-10-CM | POA: Diagnosis not present

## 2021-12-05 DIAGNOSIS — Z6838 Body mass index (BMI) 38.0-38.9, adult: Secondary | ICD-10-CM

## 2021-12-05 DIAGNOSIS — Z23 Encounter for immunization: Secondary | ICD-10-CM

## 2021-12-05 LAB — POCT URINALYSIS DIPSTICK
Bilirubin, UA: NEGATIVE
Blood, UA: NEGATIVE
Glucose, UA: NEGATIVE
Ketones, UA: NEGATIVE
Leukocytes, UA: NEGATIVE
Nitrite, UA: NEGATIVE
Protein, UA: NEGATIVE
Spec Grav, UA: 1.015 (ref 1.010–1.025)
Urobilinogen, UA: 0.2 E.U./dL
pH, UA: 6 (ref 5.0–8.0)

## 2021-12-05 NOTE — Progress Notes (Signed)
   Subjective:    Patient ID: Kelly [name], female    DOB: 01/17/1946, 76 y.o.   MRN: [id number]  HPI  76 year old Female seen for 6 month recheck and presurgical evaluation. Labs are stable.    Review of Systems     Objective:   Physical Exam        Assessment & Plan:

## 2021-12-05 NOTE — Addendum Note (Signed)
Addended by: Geradine Girt D on: 12/05/2021 03:50 PM   Modules accepted: Orders

## 2021-12-05 NOTE — Progress Notes (Signed)
   Subjective:    Patient ID: Kelly [name], female    DOB: 05-28-1945, 76 y.o.   MRN: [id number]  HPI  76 year old Female seen for follow up. Had Medicare Wellness visit by phone with  [place] on October 18.  Pneumococcal 20 vaccine and influenza vaccine needed.Will receive one vaccine today and one on Wednesday.  Scheduled for Right TKA with Dr. Ninfa Linden this coming Friday.Previously had unicompartment arthroplasty in 2016 on same knee. Having considerable amount of knee pain.  Has not been able to exercise much due to knee pain.  History of hyperlipidemia treated with generic Crestor, history of essential hypertension and impaired glucose tolerance.  Elevated serum creatinine/chronic kidney disease likely due to years of hypertension.  Has had multiple plastic surgery procedures in 2002 in 2003 including abdominoplasty, breast reduction surgery, bilateral brachioplasty.  History of facial cosmetic surgery with laser peel in 2018.  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 Fredna Dow in 2011.   right knee arthroscopic surgery 2007, cholecystectomy 1965.  Left total knee revision arthroplasty in 2013.  Hysterectomy without oophorectomy at age 51.  For additional history, see medicare wellness visit November 22, 2020. Had EKG recently at [facility] which is stable.  Social history: She is divorced.  Does not smoke.  Drinks 2 glasses of wine daily.  Family history: Father died of alcoholism.  Mother died of heart disease.  1 sister with history of lung cancer.  3 sisters in good health.  Review of Systems denies chest pain or shortness of breath.     Objective:   Physical Exam Blood pressure 132/84 pulse 71 temperature 98.2 degrees pulse oximetry 99% weight 195 pounds 12.8 ounces BMI 38.56 Neck is supple without JVD thyromegaly or carotid bruits.  Chest clear.  Cardiac exam: Regular rate and rhythm without ectopy.  No pitting edema of the lower extremities.   Affect thought and judgment appear to be normal.      Assessment & Plan:  Chronic kidney disease-longstanding stage IIIa and currently creatinine is 1.34  Impaired glucose tolerance hemoglobin A1c is 5.8%.  Currently this is just treated with diet  Hyperlipidemia treated with generic Crestor 10 mg daily.  Triglycerides elevated at 170, LDL is 102, total cholesterol 171, HDL is 40  Hypertension treated with amlodipine 5 mg daily  Plan: Patient plans left total knee arthroplasty this coming Friday.  Given flu vaccine today and will return for pneumococcal 20 vaccine in 48 hours.

## 2021-12-05 NOTE — Patient Instructions (Addendum)
Labs are stable.  Return in 48 hours for pneumococcal 20 vaccine.  Flu vaccine given today.  Health maintenance exam due in 2024.

## 2021-12-05 NOTE — Addendum Note (Signed)
Addended by: Geradine Girt D on: 12/05/2021 03:28 PM   Modules accepted: Orders

## 2021-12-06 DIAGNOSIS — R350 Frequency of micturition: Secondary | ICD-10-CM | POA: Diagnosis not present

## 2021-12-06 LAB — MICROALBUMIN, URINE: Microalb, Ur: 1.1 mg/dL

## 2021-12-07 ENCOUNTER — Ambulatory Visit: Payer: Medicare Other

## 2021-12-07 ENCOUNTER — Ambulatory Visit (INDEPENDENT_AMBULATORY_CARE_PROVIDER_SITE_OTHER): Payer: Medicare Other

## 2021-12-07 ENCOUNTER — Telehealth: Payer: Self-pay | Admitting: Internal Medicine

## 2021-12-07 VITALS — BP 120/80 | Temp 98.7°F

## 2021-12-07 DIAGNOSIS — Z7185 Encounter for immunization safety counseling: Secondary | ICD-10-CM | POA: Diagnosis not present

## 2021-12-07 DIAGNOSIS — Z23 Encounter for immunization: Secondary | ICD-10-CM

## 2021-12-07 NOTE — Telephone Encounter (Signed)
Patient is coming into today at 1:30 and she wants to know if Dr Renold Genta can do a handicapped placard form for her

## 2021-12-08 DIAGNOSIS — M1711 Unilateral primary osteoarthritis, right knee: Principal | ICD-10-CM

## 2021-12-08 NOTE — H&P (Signed)
TOTAL KNEE ADMISSION H&P  Patient is being admitted for right total knee arthroplasty.  Subjective:  Chief Complaint:right knee pain.  HPI: Kelly [name], 76 y.o. female, has a history of pain and functional disability in the right knee due to arthritis and has failed non-surgical conservative treatments for greater than 12 weeks to includeNSAID's and/or analgesics, flexibility and strengthening excercises, use of assistive devices, weight reduction as appropriate, and activity modification.  Onset of symptoms was gradual, starting 3 years ago with gradually worsening course since that time. The patient noted prior procedures on the knee to include  unicompartmental arthroplasty on the right knee(s).  Patient currently rates pain in the right knee(s) at 10 out of 10 with activity. Patient has night pain, worsening of pain with activity and weight bearing, pain that interferes with activities of daily living, pain with passive range of motion, crepitus, and joint swelling.  Patient has evidence of subchondral sclerosis, periarticular osteophytes, and joint space narrowing by imaging studies. This patient has had failure of unicompartmental arthroplasty. There is no active infection.  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/2014   Anxiety and depression 11/06/2011   Hypertension 10/13/2010   Hyperlipidemia 10/13/2010   Osteoarthritis 10/13/2010   UNSPECIFIED IRON DEFICIENCY ANEMIA 04/11/2010   DIARRHEA 04/11/2010   Past Medical History:  Diagnosis Date   Arthritis    Cataract     Chronic kidney disease    Depression    Hyperlipidemia    Hypertension    Osteoporosis     Past Surgical History:  Procedure Laterality Date   ABDOMINAL HYSTERECTOMY     BREAST REDUCTION SURGERY     and lift   BREAST REDUCTION SURGERY Bilateral    CARPAL TUNNEL RELEASE  06/2009   right   CATARACT EXTRACTION     CHOLECYSTECTOMY     FACIAL COSMETIC SURGERY  03/2008   KNEE ARTHROSCOPY  2007   left & right   LIPOSUCTION EXTREMITIES     thighs   PARTIAL KNEE ARTHROPLASTY Right 10/02/2014   Procedure: RIGHT KNEE MEDIAL UNICOMPARTMENTAL ARTHROPLASTY;  Surgeon: Mcarthur Rossetti, MD;  Location: [facility];  Service: Orthopedics;  Laterality: Right;   REDUCTION MAMMAPLASTY     bilateral   TOTAL KNEE REVISION  10/12/2011   Procedure: TOTAL KNEE REVISION;  Surgeon: Mcarthur Rossetti, MD;  Location: [facility];  Service: Orthopedics;  Laterality: Left;  Left Total Knee Revision Arthroplasty   TRIGGER FINGER RELEASE  06/2009   right 4th finger    No current facility-administered medications for this encounter.   Current Outpatient Medications  Medication Sig Dispense Refill Last Dose   amLODipine (NORVASC) 5 MG tablet TAKE 1 TABLET BY MOUTH EVERY DAY 90 tablet 1    cholecalciferol (VITAMIN D3) 25 MCG (1000 UNIT) tablet Take 1,000 Units by mouth daily.      DHA-EPA-Vitamin E (OMEGA-3 COMPLEX PO) Take 1 capsule by mouth daily.      Ferrous Sulfate (IRON PO) Take 200 mg by mouth daily.      rosuvastatin (CRESTOR) 10 MG tablet TAKE 1 TABLET(10 MG) BY MOUTH  DAILY 90 tablet 1    vitamin B-12 (CYANOCOBALAMIN) 500 MCG tablet Take 500 mcg by mouth daily.      No Known Allergies  Social History   Tobacco Use   Smoking status: Never   Smokeless tobacco: Never  Substance Use Topics   Alcohol use: Yes    Alcohol/week: 1.0 standard drink of alcohol    Types: 1 Glasses of wine per week    Comment: daily 1- 2    Family History  Problem Relation Age of Onset   Heart disease Mother     Alcohol abuse Father    Hypertension Father    Cancer Sister    Hypertension Sister    Breast cancer Neg Hx    Colon cancer Neg Hx    Colon polyps Neg Hx    Esophageal cancer Neg Hx    Stomach cancer Neg Hx    Rectal cancer Neg Hx      Review of Systems  Musculoskeletal:  Positive for gait problem and joint swelling.  All other systems reviewed and are negative.   Objective:  Physical Exam Vitals reviewed.  Constitutional:      Appearance: Normal appearance.  HENT:     Head: Normocephalic and atraumatic.  Eyes:     Extraocular Movements: Extraocular movements intact.     Pupils: Pupils are equal, round, and reactive to light.  Cardiovascular:     Rate and Rhythm: Normal rate and regular rhythm.     Pulses: Normal pulses.  Pulmonary:     Effort: Pulmonary effort is normal.     Breath sounds: Normal breath sounds.  Abdominal:     Palpations: Abdomen is soft.  Musculoskeletal:     Cervical back: Normal range of motion and neck supple.     Right knee: Effusion, bony tenderness and crepitus present. Decreased range of motion. Tenderness present over the medial joint line, lateral joint line and patellar tendon.  Neurological:     Mental Status: She is alert and oriented to person, place, and time.  Psychiatric:        Behavior: Behavior normal.     Vital signs in last 24 hours:    Labs:   Estimated body mass index is 38.56 kg/m as calculated from the following:   Height as of 12/05/21: 4' 11.75" (1.518 m).   Weight as of 12/05/21: 88.8 kg.   Imaging Review Plain radiographs demonstrate severe degenerative joint disease of the right knee(s). The overall alignment ismild varus. The bone quality appears to be good for age and reported activity level.      Assessment/Plan:  End stage arthritis, right knee   The patient history, physical examination, clinical judgment of the provider and imaging studies are consistent with end stage degenerative joint  disease of the right knee(s) and total knee arthroplasty is deemed medically necessary. The treatment options including medical management, injection therapy arthroscopy and arthroplasty were discussed at length. The risks and benefits of total knee arthroplasty were presented and reviewed. The risks due to aseptic loosening, infection, stiffness, patella tracking problems, thromboembolic complications and other imponderables were discussed. The patient acknowledged the explanation, agreed to proceed with the plan and consent was signed. Patient is being admitted for inpatient treatment for surgery, pain control, PT, OT, prophylactic antibiotics, VTE prophylaxis, progressive ambulation and ADL's and discharge planning. The patient is planning to be discharged home with home health services

## 2021-12-09 ENCOUNTER — Inpatient Hospital Stay (HOSPITAL_COMMUNITY): Payer: Medicare Other | Admitting: Anesthesiology

## 2021-12-09 ENCOUNTER — Encounter (HOSPITAL_COMMUNITY): Payer: Self-pay | Admitting: Orthopaedic Surgery

## 2021-12-09 ENCOUNTER — Inpatient Hospital Stay (HOSPITAL_COMMUNITY)
Admission: RE | Admit: 2021-12-09 | Discharge: 2021-12-11 | DRG: 468 | Disposition: A | Discharge status: Home Health | Payer: Medicare Other | Source: Ambulatory Visit | Attending: Orthopaedic Surgery | Admitting: Orthopaedic Surgery

## 2021-12-09 ENCOUNTER — Other Ambulatory Visit: Payer: Self-pay

## 2021-12-09 ENCOUNTER — Inpatient Hospital Stay (HOSPITAL_COMMUNITY): Payer: Medicare Other

## 2021-12-09 ENCOUNTER — Encounter (HOSPITAL_COMMUNITY)
Admission: RE | Disposition: A | Discharge status: Home Health | Payer: Self-pay | Source: Ambulatory Visit | Attending: Orthopaedic Surgery

## 2021-12-09 DIAGNOSIS — E1122 Type 2 diabetes mellitus with diabetic chronic kidney disease: Secondary | ICD-10-CM | POA: Diagnosis present

## 2021-12-09 DIAGNOSIS — E785 Hyperlipidemia, unspecified: Secondary | ICD-10-CM | POA: Diagnosis present

## 2021-12-09 DIAGNOSIS — E669 Obesity, unspecified: Secondary | ICD-10-CM | POA: Diagnosis present

## 2021-12-09 DIAGNOSIS — I10 Essential (primary) hypertension: Secondary | ICD-10-CM | POA: Diagnosis not present

## 2021-12-09 DIAGNOSIS — Y831 Surgical operation with implant of artificial internal device as the cause of abnormal reaction of the patient, or of later complication, without mention of misadventure at the time of the procedure: Secondary | ICD-10-CM | POA: Diagnosis present

## 2021-12-09 DIAGNOSIS — M810 Age-related osteoporosis without current pathological fracture: Secondary | ICD-10-CM | POA: Diagnosis present

## 2021-12-09 DIAGNOSIS — T84092A Other mechanical complication of internal right knee prosthesis, initial encounter: Secondary | ICD-10-CM | POA: Diagnosis present

## 2021-12-09 DIAGNOSIS — Z471 Aftercare following joint replacement surgery: Secondary | ICD-10-CM | POA: Diagnosis not present

## 2021-12-09 DIAGNOSIS — M1711 Unilateral primary osteoarthritis, right knee: Secondary | ICD-10-CM

## 2021-12-09 DIAGNOSIS — Z96651 Presence of right artificial knee joint: Secondary | ICD-10-CM | POA: Diagnosis not present

## 2021-12-09 DIAGNOSIS — Z8249 Family history of ischemic heart disease and other diseases of the circulatory system: Secondary | ICD-10-CM

## 2021-12-09 DIAGNOSIS — N289 Disorder of kidney and ureter, unspecified: Secondary | ICD-10-CM | POA: Diagnosis not present

## 2021-12-09 DIAGNOSIS — Y792 Prosthetic and other implants, materials and accessory orthopedic devices associated with adverse incidents: Secondary | ICD-10-CM | POA: Diagnosis present

## 2021-12-09 DIAGNOSIS — Z96652 Presence of left artificial knee joint: Secondary | ICD-10-CM | POA: Diagnosis present

## 2021-12-09 DIAGNOSIS — G8918 Other acute postprocedural pain: Secondary | ICD-10-CM | POA: Diagnosis not present

## 2021-12-09 DIAGNOSIS — I129 Hypertensive chronic kidney disease with stage 1 through stage 4 chronic kidney disease, or unspecified chronic kidney disease: Secondary | ICD-10-CM | POA: Diagnosis present

## 2021-12-09 DIAGNOSIS — Z6838 Body mass index (BMI) 38.0-38.9, adult: Secondary | ICD-10-CM

## 2021-12-09 DIAGNOSIS — N183 Chronic kidney disease, stage 3 unspecified: Secondary | ICD-10-CM | POA: Diagnosis present

## 2021-12-09 DIAGNOSIS — Z79899 Other long term (current) drug therapy: Secondary | ICD-10-CM

## 2021-12-09 DIAGNOSIS — E1149 Type 2 diabetes mellitus with other diabetic neurological complication: Secondary | ICD-10-CM

## 2021-12-09 DIAGNOSIS — T84012A Broken internal right knee prosthesis, initial encounter: Secondary | ICD-10-CM | POA: Diagnosis not present

## 2021-12-09 HISTORY — PX: TOTAL KNEE REVISION: SHX996

## 2021-12-09 LAB — GLUCOSE, CAPILLARY: Glucose-Capillary: 187 mg/dL — ABNORMAL HIGH (ref 70–99)

## 2021-12-09 SURGERY — TOTAL KNEE REVISION
Anesthesia: General | Site: Knee | Laterality: Right

## 2021-12-09 MED ORDER — BUPIVACAINE-EPINEPHRINE 0.25% -1:200000 IJ SOLN
INTRAMUSCULAR | Status: DC | PRN
  Administered 2021-12-09: 30 mL

## 2021-12-09 MED ORDER — HYDRALAZINE HCL 20 MG/ML IJ SOLN
INTRAMUSCULAR | Status: AC
  Filled 2021-12-09: qty 1

## 2021-12-09 MED ORDER — POLYETHYLENE GLYCOL 3350 17 G PO PACK: 17.0000 g | PACK | Freq: Every day | ORAL | Status: DC | PRN

## 2021-12-09 MED ORDER — ALBUTEROL SULFATE HFA 108 (90 BASE) MCG/ACT IN AERS
INHALATION_SPRAY | RESPIRATORY_TRACT | Status: AC
  Filled 2021-12-09: qty 6.7

## 2021-12-09 MED ORDER — PROPOFOL 10 MG/ML IV BOLUS
INTRAVENOUS | Status: DC | PRN
  Administered 2021-12-09: 160 mg via INTRAVENOUS

## 2021-12-09 MED ORDER — HYDRALAZINE HCL 20 MG/ML IJ SOLN
INTRAMUSCULAR | Status: DC | PRN
  Administered 2021-12-09: 2 mg via INTRAVENOUS
  Administered 2021-12-09: 5 mg via INTRAVENOUS
  Administered 2021-12-09: 1 mg via INTRAVENOUS
  Administered 2021-12-09: 2 mg via INTRAVENOUS

## 2021-12-09 MED ORDER — DIPHENHYDRAMINE HCL 12.5 MG/5ML PO ELIX
12.5000 mg | ORAL_SOLUTION | ORAL | Status: DC | PRN
  Administered 2021-12-10 – 2021-12-11 (×2): 25 mg via ORAL
  Filled 2021-12-09 (×2): qty 10

## 2021-12-09 MED ORDER — CHLORHEXIDINE GLUCONATE 0.12 % MT SOLN
15.0000 mL | Freq: Once | OROMUCOSAL | Status: AC
  Administered 2021-12-09: 15 mL via OROMUCOSAL

## 2021-12-09 MED ORDER — METHOCARBAMOL 500 MG PO TABS
500.0000 mg | ORAL_TABLET | Freq: Four times a day (QID) | ORAL | Status: DC | PRN
  Administered 2021-12-10 – 2021-12-11 (×2): 500 mg via ORAL
  Filled 2021-12-09 (×2): qty 1

## 2021-12-09 MED ORDER — HYDROMORPHONE HCL 2 MG PO TABS
2.0000 mg | ORAL_TABLET | ORAL | Status: DC | PRN
  Administered 2021-12-09: 3 mg via ORAL
  Administered 2021-12-10 – 2021-12-11 (×4): 2 mg via ORAL
  Filled 2021-12-09: qty 1
  Filled 2021-12-09: qty 2
  Filled 2021-12-09 (×3): qty 1

## 2021-12-09 MED ORDER — SODIUM CHLORIDE 0.9 % IV SOLN: INTRAVENOUS | Status: DC

## 2021-12-09 MED ORDER — METOCLOPRAMIDE HCL 5 MG/ML IJ SOLN: 5.0000 mg | Freq: Three times a day (TID) | INTRAMUSCULAR | Status: DC | PRN

## 2021-12-09 MED ORDER — ONDANSETRON HCL 4 MG PO TABS: 4.0000 mg | ORAL_TABLET | Freq: Four times a day (QID) | ORAL | Status: DC | PRN

## 2021-12-09 MED ORDER — FENTANYL CITRATE (PF) 100 MCG/2ML IJ SOLN
INTRAMUSCULAR | Status: AC
  Filled 2021-12-09: qty 2

## 2021-12-09 MED ORDER — APIXABAN 2.5 MG PO TABS: 2.5000 mg | ORAL_TABLET | Freq: Two times a day (BID) | ORAL | Status: DC

## 2021-12-09 MED ORDER — SODIUM CHLORIDE 0.9 % IR SOLN
Status: DC | PRN
  Administered 2021-12-09: 1000 mL

## 2021-12-09 MED ORDER — DEXAMETHASONE SODIUM PHOSPHATE 10 MG/ML IJ SOLN
INTRAMUSCULAR | Status: DC | PRN
  Administered 2021-12-09: 5 mg via INTRAVENOUS

## 2021-12-09 MED ORDER — FENTANYL CITRATE PF 50 MCG/ML IJ SOSY
50.0000 ug | PREFILLED_SYRINGE | INTRAMUSCULAR | Status: DC
  Administered 2021-12-09: 100 ug via INTRAVENOUS
  Filled 2021-12-09 (×2): qty 2

## 2021-12-09 MED ORDER — DOCUSATE SODIUM 100 MG PO CAPS
100.0000 mg | ORAL_CAPSULE | Freq: Two times a day (BID) | ORAL | Status: DC
  Administered 2021-12-09 – 2021-12-11 (×4): 100 mg via ORAL
  Filled 2021-12-09 (×4): qty 1

## 2021-12-09 MED ORDER — POVIDONE-IODINE 10 % EX SWAB
2.0000 | Freq: Once | CUTANEOUS | Status: AC
  Administered 2021-12-09: 2 via TOPICAL

## 2021-12-09 MED ORDER — HYDROMORPHONE HCL 1 MG/ML IJ SOLN
INTRAMUSCULAR | Status: DC | PRN
  Administered 2021-12-09 (×4): .5 mg via INTRAVENOUS

## 2021-12-09 MED ORDER — PROPOFOL 500 MG/50ML IV EMUL
INTRAVENOUS | Status: DC | PRN
  Administered 2021-12-09: 20 ug/kg/min via INTRAVENOUS

## 2021-12-09 MED ORDER — PHENYLEPHRINE HCL-NACL 20-0.9 MG/250ML-% IV SOLN
INTRAVENOUS | Status: AC
  Filled 2021-12-09: qty 250

## 2021-12-09 MED ORDER — ACETAMINOPHEN 500 MG PO TABS
1000.0000 mg | ORAL_TABLET | Freq: Once | ORAL | Status: AC
  Administered 2021-12-09: 1000 mg via ORAL
  Filled 2021-12-09: qty 2

## 2021-12-09 MED ORDER — PROPOFOL 1000 MG/100ML IV EMUL
INTRAVENOUS | Status: AC
  Filled 2021-12-09: qty 100

## 2021-12-09 MED ORDER — ONDANSETRON HCL 4 MG/2ML IJ SOLN
INTRAMUSCULAR | Status: DC | PRN
  Administered 2021-12-09: 4 mg via INTRAVENOUS

## 2021-12-09 MED ORDER — ORAL CARE MOUTH RINSE: 15.0000 mL | Freq: Once | OROMUCOSAL | Status: AC

## 2021-12-09 MED ORDER — ACETAMINOPHEN 10 MG/ML IV SOLN
INTRAVENOUS | Status: AC
  Filled 2021-12-09: qty 100

## 2021-12-09 MED ORDER — HYDROMORPHONE HCL 1 MG/ML IJ SOLN
0.2500 mg | INTRAMUSCULAR | Status: DC | PRN
  Administered 2021-12-09 (×2): 0.5 mg via INTRAVENOUS

## 2021-12-09 MED ORDER — ALUM & MAG HYDROXIDE-SIMETH 200-200-20 MG/5ML PO SUSP: 30.0000 mL | ORAL | Status: DC | PRN

## 2021-12-09 MED ORDER — KETOROLAC TROMETHAMINE 15 MG/ML IJ SOLN
15.0000 mg | Freq: Four times a day (QID) | INTRAMUSCULAR | Status: DC
  Administered 2021-12-10 (×2): 15 mg via INTRAVENOUS
  Filled 2021-12-09 (×2): qty 1

## 2021-12-09 MED ORDER — ALBUMIN HUMAN 5 % IV SOLN
INTRAVENOUS | Status: AC
  Filled 2021-12-09: qty 250

## 2021-12-09 MED ORDER — MENTHOL 3 MG MT LOZG: 1.0000 | LOZENGE | OROMUCOSAL | Status: DC | PRN

## 2021-12-09 MED ORDER — ACETAMINOPHEN 10 MG/ML IV SOLN
1000.0000 mg | Freq: Once | INTRAVENOUS | Status: DC | PRN
  Administered 2021-12-09: 1000 mg via INTRAVENOUS

## 2021-12-09 MED ORDER — METHOCARBAMOL 500 MG IVPB - SIMPLE MED
INTRAVENOUS | Status: AC
  Filled 2021-12-09: qty 55

## 2021-12-09 MED ORDER — METOCLOPRAMIDE HCL 5 MG PO TABS: 5.0000 mg | ORAL_TABLET | Freq: Three times a day (TID) | ORAL | Status: DC | PRN

## 2021-12-09 MED ORDER — KETOROLAC TROMETHAMINE 15 MG/ML IJ SOLN
15.0000 mg | Freq: Once | INTRAMUSCULAR | Status: AC
  Administered 2021-12-09: 15 mg via INTRAVENOUS
  Filled 2021-12-09: qty 1

## 2021-12-09 MED ORDER — STERILE WATER FOR IRRIGATION IR SOLN
Status: DC | PRN
  Administered 2021-12-09: 2000 mL

## 2021-12-09 MED ORDER — ACETAMINOPHEN 325 MG PO TABS
325.0000 mg | ORAL_TABLET | Freq: Four times a day (QID) | ORAL | Status: DC | PRN
  Administered 2021-12-10 – 2021-12-11 (×3): 650 mg via ORAL
  Filled 2021-12-09 (×3): qty 2

## 2021-12-09 MED ORDER — MIDAZOLAM HCL 2 MG/2ML IJ SOLN
INTRAMUSCULAR | Status: AC
  Filled 2021-12-09: qty 2

## 2021-12-09 MED ORDER — ONDANSETRON HCL 4 MG/2ML IJ SOLN
INTRAMUSCULAR | Status: AC
  Filled 2021-12-09: qty 2

## 2021-12-09 MED ORDER — PANTOPRAZOLE SODIUM 40 MG PO TBEC
40.0000 mg | DELAYED_RELEASE_TABLET | Freq: Every day | ORAL | Status: DC
  Administered 2021-12-09 – 2021-12-11 (×3): 40 mg via ORAL
  Filled 2021-12-09 (×3): qty 1

## 2021-12-09 MED ORDER — HYDROMORPHONE HCL 1 MG/ML IJ SOLN
INTRAMUSCULAR | Status: AC
  Filled 2021-12-09: qty 2

## 2021-12-09 MED ORDER — DEXMEDETOMIDINE HCL IN NACL 80 MCG/20ML IV SOLN
INTRAVENOUS | Status: DC | PRN
  Administered 2021-12-09: 8 ug via BUCCAL
  Administered 2021-12-09: 4 ug via BUCCAL

## 2021-12-09 MED ORDER — DEXMEDETOMIDINE HCL IN NACL 80 MCG/20ML IV SOLN
INTRAVENOUS | Status: AC
  Filled 2021-12-09: qty 20

## 2021-12-09 MED ORDER — LIDOCAINE HCL (CARDIAC) PF 100 MG/5ML IV SOSY
PREFILLED_SYRINGE | INTRAVENOUS | Status: DC | PRN
  Administered 2021-12-09: 50 mg via INTRAVENOUS

## 2021-12-09 MED ORDER — OXYCODONE HCL 5 MG PO TABS
5.0000 mg | ORAL_TABLET | ORAL | Status: DC | PRN
  Administered 2021-12-09 – 2021-12-11 (×6): 10 mg via ORAL
  Filled 2021-12-09 (×6): qty 2

## 2021-12-09 MED ORDER — MIDAZOLAM HCL 5 MG/5ML IJ SOLN
INTRAMUSCULAR | Status: DC | PRN
  Administered 2021-12-09: .5 mg via INTRAVENOUS
  Administered 2021-12-09: 1 mg via INTRAVENOUS
  Administered 2021-12-09: .5 mg via INTRAVENOUS

## 2021-12-09 MED ORDER — PHENOL 1.4 % MT LIQD: 1.0000 | OROMUCOSAL | Status: DC | PRN

## 2021-12-09 MED ORDER — FENTANYL CITRATE (PF) 100 MCG/2ML IJ SOLN
INTRAMUSCULAR | Status: DC | PRN
  Administered 2021-12-09 (×2): 50 ug via INTRAVENOUS
  Administered 2021-12-09: 25 ug via INTRAVENOUS
  Administered 2021-12-09: 50 ug via INTRAVENOUS
  Administered 2021-12-09: 25 ug via INTRAVENOUS

## 2021-12-09 MED ORDER — LIDOCAINE HCL (PF) 2 % IJ SOLN
INTRAMUSCULAR | Status: AC
  Filled 2021-12-09: qty 5

## 2021-12-09 MED ORDER — BUPIVACAINE-EPINEPHRINE (PF) 0.25% -1:200000 IJ SOLN
INTRAMUSCULAR | Status: AC
  Filled 2021-12-09: qty 30

## 2021-12-09 MED ORDER — MIDAZOLAM HCL 2 MG/2ML IJ SOLN: 1.0000 mg | Freq: Once | INTRAMUSCULAR | Status: DC

## 2021-12-09 MED ORDER — EPHEDRINE SULFATE (PRESSORS) 50 MG/ML IJ SOLN
INTRAMUSCULAR | Status: DC | PRN
  Administered 2021-12-09 (×2): 5 mg via INTRAVENOUS

## 2021-12-09 MED ORDER — HYDROMORPHONE HCL 1 MG/ML IJ SOLN
INTRAMUSCULAR | Status: AC
  Filled 2021-12-09: qty 1

## 2021-12-09 MED ORDER — AMLODIPINE BESYLATE 5 MG PO TABS
5.0000 mg | ORAL_TABLET | Freq: Every day | ORAL | Status: DC
  Administered 2021-12-10 – 2021-12-11 (×2): 5 mg via ORAL
  Filled 2021-12-09 (×2): qty 1

## 2021-12-09 MED ORDER — METHOCARBAMOL 500 MG IVPB - SIMPLE MED
500.0000 mg | Freq: Four times a day (QID) | INTRAVENOUS | Status: DC | PRN
  Administered 2021-12-09: 500 mg via INTRAVENOUS

## 2021-12-09 MED ORDER — VITAMIN D 25 MCG (1000 UNIT) PO TABS
1000.0000 [IU] | ORAL_TABLET | Freq: Every day | ORAL | Status: DC
  Administered 2021-12-09 – 2021-12-11 (×3): 1000 [IU] via ORAL
  Filled 2021-12-09 (×3): qty 1

## 2021-12-09 MED ORDER — LACTATED RINGERS IV SOLN: INTRAVENOUS | Status: DC

## 2021-12-09 MED ORDER — TRANEXAMIC ACID-NACL 1000-0.7 MG/100ML-% IV SOLN
1000.0000 mg | INTRAVENOUS | Status: AC
  Administered 2021-12-09: 1000 mg via INTRAVENOUS
  Filled 2021-12-09: qty 100

## 2021-12-09 MED ORDER — HYDROMORPHONE HCL 1 MG/ML IJ SOLN
0.5000 mg | INTRAMUSCULAR | Status: DC | PRN
  Administered 2021-12-09: 1 mg via INTRAVENOUS
  Filled 2021-12-09: qty 1

## 2021-12-09 MED ORDER — ALBUMIN HUMAN 5 % IV SOLN: INTRAVENOUS | Status: DC | PRN

## 2021-12-09 MED ORDER — CEFAZOLIN SODIUM-DEXTROSE 1-4 GM/50ML-% IV SOLN
1.0000 g | Freq: Four times a day (QID) | INTRAVENOUS | Status: AC
  Administered 2021-12-09 – 2021-12-10 (×2): 1 g via INTRAVENOUS
  Filled 2021-12-09 (×2): qty 50

## 2021-12-09 MED ORDER — HYDROMORPHONE HCL 2 MG/ML IJ SOLN
INTRAMUSCULAR | Status: AC
  Filled 2021-12-09: qty 1

## 2021-12-09 MED ORDER — ONDANSETRON HCL 4 MG/2ML IJ SOLN: 4.0000 mg | Freq: Four times a day (QID) | INTRAMUSCULAR | Status: DC | PRN

## 2021-12-09 MED ORDER — HYDROMORPHONE HCL 1 MG/ML IJ SOLN
0.2500 mg | INTRAMUSCULAR | Status: DC | PRN
  Administered 2021-12-09 (×4): 0.5 mg via INTRAVENOUS

## 2021-12-09 MED ORDER — PHENYLEPHRINE HCL-NACL 20-0.9 MG/250ML-% IV SOLN
INTRAVENOUS | Status: DC | PRN
  Administered 2021-12-09: 30 ug/min via INTRAVENOUS

## 2021-12-09 MED ORDER — CEFAZOLIN SODIUM-DEXTROSE 2-4 GM/100ML-% IV SOLN
2.0000 g | INTRAVENOUS | Status: AC
  Administered 2021-12-09: 2 g via INTRAVENOUS
  Filled 2021-12-09: qty 100

## 2021-12-09 MED ORDER — DEXAMETHASONE SODIUM PHOSPHATE 10 MG/ML IJ SOLN
INTRAMUSCULAR | Status: AC
  Filled 2021-12-09: qty 1

## 2021-12-09 MED ORDER — EPHEDRINE 5 MG/ML INJ
INTRAVENOUS | Status: AC
  Filled 2021-12-09: qty 5

## 2021-12-09 SURGICAL SUPPLY — 64 items
APL SKNCLS STERI-STRIP NONHPOA (GAUZE/BANDAGES/DRESSINGS)
BAG COUNTER SPONGE SURGICOUNT (BAG) IMPLANT
BAG SPEC THK2 15X12 ZIP CLS (MISCELLANEOUS)
BAG SPNG CNTER NS LX DISP (BAG) ×1
BAG ZIPLOCK 12X15 (MISCELLANEOUS) IMPLANT
BENZOIN TINCTURE PRP APPL 2/3 (GAUZE/BANDAGES/DRESSINGS) IMPLANT
BLADE SAG 18X100X1.27 (BLADE) ×1 IMPLANT
BLADE SURG SZ10 CARB STEEL (BLADE) ×2 IMPLANT
BNDG ELASTIC 6X5.8 VLCR STR LF (GAUZE/BANDAGES/DRESSINGS) ×1 IMPLANT
BSPLAT TIB 5D E CMNT KN RT (Knees) ×1 IMPLANT
CEMENT BONE R 1X40 (Cement) IMPLANT
CLOTH BEACON ORANGE TIMEOUT ST (SAFETY) ×1 IMPLANT
COMP FEM CMT PERSONA SZ7 RT (Joint) ×1 IMPLANT
COMPONENT FEM CMT PRSONA SZ7RT (Joint) IMPLANT
COVER SURGICAL LIGHT HANDLE (MISCELLANEOUS) ×1 IMPLANT
CUFF TOURN SGL QUICK 34 (TOURNIQUET CUFF) ×1
CUFF TRNQT CYL 34X4.125X (TOURNIQUET CUFF) ×1 IMPLANT
DRAPE INCISE IOBAN 66X45 STRL (DRAPES) ×3 IMPLANT
DRAPE U-SHAPE 47X51 STRL (DRAPES) ×1 IMPLANT
DURAPREP 26ML APPLICATOR (WOUND CARE) ×1 IMPLANT
ELECT BLADE TIP CTD 4 INCH (ELECTRODE) IMPLANT
ELECT REM PT RETURN 15FT ADLT (MISCELLANEOUS) ×1 IMPLANT
EVACUATOR 1/8 PVC DRAIN (DRAIN) IMPLANT
GAUZE PAD ABD 8X10 STRL (GAUZE/BANDAGES/DRESSINGS) ×1 IMPLANT
GAUZE SPONGE 4X4 12PLY STRL (GAUZE/BANDAGES/DRESSINGS) ×1 IMPLANT
GAUZE XEROFORM 5X9 LF (GAUZE/BANDAGES/DRESSINGS) IMPLANT
GLOVE BIO SURGEON STRL SZ7.5 (GLOVE) ×1 IMPLANT
GLOVE BIOGEL PI IND STRL 8 (GLOVE) ×2 IMPLANT
GLOVE ECLIPSE 8.0 STRL XLNG CF (GLOVE) ×1 IMPLANT
GOWN STRL REUS W/ TWL XL LVL3 (GOWN DISPOSABLE) ×2 IMPLANT
GOWN STRL REUS W/TWL XL LVL3 (GOWN DISPOSABLE) ×2
HANDPIECE INTERPULSE COAX TIP (DISPOSABLE) ×1
HDLS TROCR DRIL PIN KNEE 75 (PIN) ×1
HOLDER FOLEY CATH W/STRAP (MISCELLANEOUS) IMPLANT
IMMOBILIZER KNEE 20 (SOFTGOODS) ×1
IMMOBILIZER KNEE 20 THIGH 36 (SOFTGOODS) ×1 IMPLANT
KIT TURNOVER KIT A (KITS) IMPLANT
LINER TIB FX PS EF/6-7 18 RT (Liner) IMPLANT
NDL SAFETY ECLIP 18X1.5 (MISCELLANEOUS) IMPLANT
PACK TOTAL KNEE CUSTOM (KITS) ×1 IMPLANT
PADDING CAST COTTON 6X4 STRL (CAST SUPPLIES) ×2 IMPLANT
PIN DRILL HDLS TROCAR 75 4PK (PIN) IMPLANT
PROTECTOR NERVE ULNAR (MISCELLANEOUS) ×1 IMPLANT
SCREW FEMALE HEX FIX 25X2.5 (ORTHOPEDIC DISPOSABLE SUPPLIES) IMPLANT
SET HNDPC FAN SPRY TIP SCT (DISPOSABLE) ×1 IMPLANT
SET PAD KNEE POSITIONER (MISCELLANEOUS) ×1 IMPLANT
STAPLER VISISTAT 35W (STAPLE) IMPLANT
STEM POLY PAT PLY 32M KNEE (Knees) IMPLANT
STEM TIB ST PERS 14+30 (Stem) IMPLANT
STEM TIBIA 5 DEG SZ E R KNEE (Knees) IMPLANT
STRIP CLOSURE SKIN 1/2X4 (GAUZE/BANDAGES/DRESSINGS) IMPLANT
SUCTION FRAZIER HANDLE 10FR (MISCELLANEOUS) ×1
SUCTION TUBE FRAZIER 10FR DISP (MISCELLANEOUS) IMPLANT
SUT MNCRL AB 4-0 PS2 18 (SUTURE) IMPLANT
SUT VIC AB 0 CT1 36 (SUTURE) ×1 IMPLANT
SUT VIC AB 1 CT1 36 (SUTURE) ×2 IMPLANT
SUT VIC AB 2-0 CT1 27 (SUTURE) ×2
SUT VIC AB 2-0 CT1 TAPERPNT 27 (SUTURE) ×2 IMPLANT
SYR 3ML LL SCALE MARK (SYRINGE) IMPLANT
TIBIA STEM 5 DEG SZ E R KNEE (Knees) ×1 IMPLANT
TOWER CARTRIDGE SMART MIX (DISPOSABLE) IMPLANT
TRAY FOLEY MTR SLVR 14FR STAT (SET/KITS/TRAYS/PACK) IMPLANT
TUBE KAMVAC SUCTION (TUBING) IMPLANT
WATER STERILE IRR 1000ML POUR (IV SOLUTION) ×1 IMPLANT

## 2021-12-09 NOTE — Op Note (Signed)
Operative Note  Date of operation: 12/09/2021 Preoperative diagnosis: 1) failed unicompartmental knee arthroplasty right knee        2) osteoarthritis right knee Operative diagnosis: Same  Procedure: 1) removal of right medial unicompartmental knee arthroplasty                    2) revision right total knee arthroplasty  Implants: Biomet/Zimmer cemented persona knee system with size 7 right standard CR femur, size E right tibial tray, 30 mm BL stem extension, 18 mm medial congruent right fixed-bearing polythene insert, 32 mm patella button  Surgeon: Lind Guest. Ninfa Linden, MD Assistant: Benita Stabile, PA-C  Anesthesia: #1 attempted spinal, #2 General, #3 regional right lower extremity block, #4 local EBL: Less than 100 cc Tourniquet time: 76 minutes Antibiotics: 2 g IV Ancef Locations: None  Indications: The patient is a 76 year old female who many years ago underwent a right unicompartmental knee arthroplasty.  Since this time she is gradually developed worsening severe arthritis of the right knee lateral compartment and patellofemoral joint.  There is potential for some slight loosening of the medial compartment unicompartment arthroplasty but given her recurrent effusions and the severity of the arthritis throughout the knee, we are recommending conversion of her unicompartmental knee arthroplasty to a total knee arthroplasty.  This would be her revision surgery with removing the original medial components and then converting to a total knee replacement.  We had a long and thorough discussion about the risk of bone loss with the surgery we talked about the risk of acute blood loss anemia, nerve vessel injury, fracture, infection, DVT, implant failure and wound healing issues.  We talked about her goals being hopefully decrease pain, improve mobility, and improve quality of life.  Procedure description: After informed consent was obtained and the appropriate right knee was marked,  anesthesia obtained a right lower extremity regional block in the holding room.  The patient was then brought to the operating room and set up on the operative table where spinal anesthesia was attempted but unsuccessful.  She was laid in supine position and general anesthesia was obtained.  A nonsterile tourniquet is placed around her upper right thigh and her right thigh, knee, leg, and ankle were prepped and draped with DuraPrep and sterile drapes including a sterile stockinette.  A timeout was called and she identified as the correct patient the correct right knee.  We then used Esmarch to wrap out the leg and tourniquet was plated to 300 mm of pressure.  Her original incision was quite medial so we felt it was appropriate to make a new incision since it been so many years remotely since her partial knee arthroplasty.  This midline incision was made over the patella and carried proximally distally.  We dissect down the knee joint carried out a medial parapatellar arthrotomy finding a large joint effusion.  There was no evidence of metallosis.  There was severe end-stage arthritis at the patellofemoral joint and the lateral compartment of her knee.  We then first removed the medial compartment tibial component and the polyliner.  There was no significant wear of the poly and it was actually difficult to get out the tibial component.  We then used an extramedullary cutting guide for making our freshening cut of the tibia planing at the same level of the previous cut just barely to freshening this cut.  We then went to the femur and The femoral component on so just we can get our distal femoral cutting  guide lined up and once we did that for a right knee at 5 degrees external rotated for 10 mm distal femoral cut, remove the cutting block and then were able to remove the medial compartment femoral component without any significant bone loss.  We then put the cutting block on the femur and made her distal femoral  cut.  We brought the knee back down full extension and with a 16 mm extension block had obtain full extension.  We then back to the femur and put our femoral sizing guide based off the epicondylar axis and make sure we had this 30 degrees externally rotated.  Based off of this we chose a size 7 femur.  We put a 4-in-1 cutting block for size 7 femur made her anterior posterior cuts followed our chamfer cuts.  We then went back to the tibia and chose a size E right tibial tray for coverage over the tibial plateau so the rotation of the tibial tubercle and the femur.  We did a drill hole for a longer stem and then a keel punch off of this.  We then trialed our size E right tibia followed by a 7 right standard CR femur.  We placed a 16 mm medial congruent right polyethylene spacer trial and went up to an 18 trial and we are pleased with the range of motion and stability with a size 18 thickness trial.  We then made a patella cut and drilled 3 holes for a size 32 patella button.  With all transportation the knee we put her through several cycles of motion and we are pleased with range of motion and stability.  We then removed all irritation from the knee and irrigate the knee with normal saline solution.  We placed Marcaine with epinephrine around the arthrotomy.  The cement was mixed off the back table and then we cemented our Biomet Zimmer tibial tray for right knee size E with a 30 mm extension stem.  We cemented our size 7 right standard CR femur.  We placed our 18 mm medial congruent polythene insert and cemented our size 32 patella button.  We then held the knee fully extended and compressed to allow the cement to harden.  Once it hardened the tourniquet was let down hemostasis was obtained electrocautery.  The arthrotomy was closed with interrupted #1 Vicryl suture followed by 0 Vicryl close the deep tissue and 2-0 Vicryl close subcutaneous tissue.  The skin was closed with staples.  The patient was awakened,  extubated and taken recovery in stable addition with all final counts being correct and no complications noted.  Benita Stabile, PA-C did assist the entire case and his assistance was medically necessary and crucial for soft tissue retraction and management, helping guide implant placement and a layered closure of the wound.

## 2021-12-09 NOTE — Discharge Instructions (Addendum)

## 2021-12-09 NOTE — Anesthesia Procedure Notes (Signed)
Anesthesia Regional Block: Adductor canal block   Pre-Anesthetic Checklist: , timeout performed,  Correct Patient, Correct Site, Correct Laterality,  Correct Procedure, Correct Position, site marked,  Risks and benefits discussed,  Surgical consent,  Pre-op evaluation,  At surgeon's request and post-op pain management  Laterality: Right  Prep: chloraprep       Needles:   Needle Type: Stimulator Needle - 80          Additional Needles:   Procedures: Doppler guided,,,, ultrasound used (permanent image in chart),,    Narrative:  Start time: 12/09/2021 12:15 PM End time: 12/09/2021 12:35 PM Injection made incrementally with aspirations every 5 mL.  Performed by: Other  Anesthesiologist: Belinda Block, MD

## 2021-12-09 NOTE — Anesthesia Preprocedure Evaluation (Addendum)
Anesthesia Evaluation  Patient identified by MRN, date of birth, ID band Patient awake    Reviewed: Allergy & Precautions, NPO status , Patient's Chart, lab work & pertinent test results  Airway Mallampati: II       Dental   Pulmonary neg pulmonary ROS,    breath sounds clear to auscultation       Cardiovascular hypertension,  Rhythm:Regular Rate:Normal     Neuro/Psych PSYCHIATRIC DISORDERS  Neuromuscular disease    GI/Hepatic negative GI ROS, Neg liver ROS,   Endo/Other  diabetes  Renal/GU Renal disease     Musculoskeletal  (+) Arthritis ,   Abdominal   Peds  Hematology   Anesthesia Other Findings   Reproductive/Obstetrics                             Anesthesia Physical Anesthesia Plan  ASA: 3  Anesthesia Plan:    Post-op Pain Management: Regional block*   Induction: Intravenous  PONV Risk Score and Plan: 3 and Ondansetron, Dexamethasone and Midazolam  Airway Management Planned: Nasal Cannula and Simple Face Mask  Additional Equipment:   Intra-op Plan:   Post-operative Plan:   Informed Consent: I have reviewed the patients History and Physical, chart, labs and discussed the procedure including the risks, benefits and alternatives for the proposed anesthesia with the patient or authorized representative who has indicated his/her understanding and acceptance.     Dental advisory given  Plan Discussed with: CRNA and Anesthesiologist  Anesthesia Plan Comments:        Anesthesia Quick Evaluation

## 2021-12-09 NOTE — Plan of Care (Signed)

## 2021-12-09 NOTE — Transfer of Care (Signed)
Immediate Anesthesia Transfer of Care Note  Patient: Kelly [name]  Procedure(s) Performed: Adonis Housekeeper RIGHT UNI KNEE ARTHROPLASTY TO TOTAL KNEE ARTHROPLASTY (Right: Knee)  Patient Location: PACU  Anesthesia Type:General  Level of Consciousness: oriented, drowsy and patient cooperative  Airway & Oxygen Therapy: Patient Spontanous Breathing and Patient connected to face mask oxygen  Post-op Assessment: Report given to RN and Post -op Vital signs reviewed and stable  Post vital signs: Reviewed and stable  Last Vitals:  Vitals Value Taken Time  BP 133/72 12/09/21 1600  Temp 36.7 C 12/09/21 1600  Pulse 73 12/09/21 1604  Resp 17 12/09/21 1604  SpO2 97 % 12/09/21 1604  Vitals shown include unvalidated device data.  Last Pain:  Vitals:   12/09/21 1600  TempSrc:   PainSc: Asleep         Complications: No notable events documented.

## 2021-12-09 NOTE — Plan of Care (Signed)
  Problem: Education: Goal: Knowledge of the prescribed therapeutic regimen will improve Outcome: Progressing   Problem: Pain Management: Goal: Pain level will decrease with appropriate interventions Outcome: Progressing   Problem: Safety: Goal: Ability to remain free from injury will improve Outcome: Progressing   Problem: Activity: Goal: Range of joint motion will improve Outcome: Progressing

## 2021-12-09 NOTE — Interval H&P Note (Signed)
History and Physical Interval Note: The patient understands that she is here today for removal of her right partial knee arthroplasty and converting to a total knee arthroplasty given the arthritis in her knee.  There has been no acute or interval change in her medical status.  See H&P.  The risks and benefits of surgery have been explained in detail and informed consent is obtained.  The right operative knee has been marked.  12/09/2021 11:47 AM  Kelly [name]  has presented today for surgery, with the diagnosis of OSTEOARTHRITIS RIGHT KNEE.  The various methods of treatment have been discussed with the patient and family. After consideration of risks, benefits and other options for treatment, the patient has consented to  Procedure(s): CONVERT RIGHT UNI KNEE ARTHROPLASTY TO TOTAL KNEE ARTHROPLASTY (Right) as a surgical intervention.  The patient's history has been reviewed, patient examined, no change in status, stable for surgery.  I have reviewed the patient's chart and labs.  Questions were answered to the patient's satisfaction.     Mcarthur Rossetti

## 2021-12-09 NOTE — Anesthesia Procedure Notes (Signed)
Procedure Name: LMA Insertion Date/Time: 12/09/2021 1:34 PM  Performed by: Garrel Ridgel, CRNAPre-anesthesia Checklist: Patient identified, Emergency Drugs available, Suction available and Patient being monitored Patient Re-evaluated:Patient Re-evaluated prior to induction Oxygen Delivery Method: Circle system utilized Preoxygenation: Pre-oxygenation with 100% oxygen Induction Type: IV induction Ventilation: Mask ventilation without difficulty LMA: LMA inserted LMA Size: 4.0 Tube type: Oral Number of attempts: 1 Placement Confirmation: positive ETCO2 and breath sounds checked- equal and bilateral Tube secured with: Tape Dental Injury: Teeth and Oropharynx as per pre-operative assessment

## 2021-12-10 LAB — BASIC METABOLIC PANEL
Anion gap: 6 (ref 5–15)
BUN: 16 mg/dL (ref 8–23)
CO2: 25 mmol/L (ref 22–32)
Calcium: 9.2 mg/dL (ref 8.9–10.3)
Chloride: 103 mmol/L (ref 98–111)
Creatinine, Ser: 1.28 mg/dL — ABNORMAL HIGH (ref 0.44–1.00)
GFR, Estimated: 43 mL/min — ABNORMAL LOW (ref >60)
Glucose, Bld: 121 mg/dL — ABNORMAL HIGH (ref 70–99)
Potassium: 3.9 mmol/L (ref 3.5–5.1)
Sodium: 134 mmol/L — ABNORMAL LOW (ref 135–145)

## 2021-12-10 LAB — CBC
HCT: 34.4 % — ABNORMAL LOW (ref 36.0–46.0)
Hemoglobin: 11.4 g/dL — ABNORMAL LOW (ref 12.0–15.0)
MCH: 30 pg (ref 26.0–34.0)
MCHC: 33.1 g/dL (ref 30.0–36.0)
MCV: 90.5 fL (ref 80.0–100.0)
Platelets: 180 10*3/uL (ref 150–400)
RBC: 3.8 MIL/uL — ABNORMAL LOW (ref 3.87–5.11)
RDW: 14.1 % (ref 11.5–15.5)
WBC: 8.5 10*3/uL (ref 4.0–10.5)
nRBC: 0 % (ref 0.0–0.2)

## 2021-12-10 MED ORDER — ASPIRIN 81 MG PO CHEW
81.0000 mg | CHEWABLE_TABLET | Freq: Two times a day (BID) | ORAL | Status: DC
  Administered 2021-12-10 – 2021-12-11 (×3): 81 mg via ORAL
  Filled 2021-12-10 (×3): qty 1

## 2021-12-10 MED ORDER — OXYCODONE HCL 5 MG PO TABS: 5.0000 mg | ORAL_TABLET | ORAL | 0 refills | Status: DC | PRN

## 2021-12-10 MED ORDER — METHOCARBAMOL 500 MG PO TABS: 500.0000 mg | ORAL_TABLET | Freq: Four times a day (QID) | ORAL | 1 refills | Status: DC | PRN

## 2021-12-10 MED ORDER — ASPIRIN 81 MG PO CHEW: 81.0000 mg | CHEWABLE_TABLET | Freq: Two times a day (BID) | ORAL | 1 refills | Status: DC

## 2021-12-10 NOTE — Plan of Care (Signed)
  Problem: Education: Goal: Knowledge of General Education information will improve Description: Including pain rating scale, medication(s)/side effects and non-pharmacologic comfort measures Outcome: Progressing   Problem: Activity: Goal: Risk for activity intolerance will decrease Outcome: Progressing   

## 2021-12-10 NOTE — Evaluation (Signed)
Physical Therapy Evaluation Patient Details Name: Kelly [name] MRN: [id number] DOB: July 24, 1945 Today's Date: 12/10/2021  History of Present Illness  76 yo female s/p CONVERSION RIGHT UNI KNEE ARTHROPLASTY TO TOTAL KNEE ARTHROPLASTY on 12/09/21. PMH: L TKA wit multiple revisions, HTN, DM, anxiety and depression  Clinical Impression  Pt is s/p TKA resulting in the deficits listed below (see PT Problem List).  Pt amb 56' with  RW and min/guard assist. Reports she had an excessive amount of pain last night but it is now improved. Dajahnae feels she is not ready to d/c home today. Continue PT POC  Pt will benefit from skilled PT to increase their independence and safety with mobility to allow discharge to the venue listed below.         Recommendations for follow up therapy are one component of a multi-disciplinary discharge planning process, led by the attending physician.  Recommendations may be updated based on patient status, additional functional criteria and insurance authorization.  Follow Up Recommendations Follow physician's recommendations for discharge plan and follow up therapies      Assistance Recommended at Discharge Intermittent Supervision/Assistance  Patient can return home with the following  A little help with bathing/dressing/bathroom;Assist for transportation;Help with stairs or ramp for entrance    Equipment Recommendations None recommended by PT  Recommendations for Other Services       Functional Status Assessment Patient has had a recent decline in their functional status and demonstrates the ability to make significant improvements in function in a reasonable and predictable amount of time.     Precautions / Restrictions Precautions Precautions: Knee Restrictions Weight Bearing Restrictions: No RLE Weight Bearing: Weight bearing as tolerated      Mobility  Bed Mobility               General bed mobility comments: in chair     Transfers Overall transfer level: Needs assistance Equipment used: Rolling walker (2 wheels) Transfers: Sit to/from Stand Sit to Stand: Min guard           General transfer comment: cues for hand placement, incr time    Ambulation/Gait Ambulation/Gait assistance: Min guard Gait Distance (Feet): 80 Feet Assistive device: Rolling walker (2 wheels) Gait Pattern/deviations: Step-to pattern, Decreased weight shift to right       General Gait Details: cues for sequence and posture  Stairs            Wheelchair Mobility    Modified Rankin (Stroke Patients Only)       Balance                                             Pertinent Vitals/Pain Pain Assessment Pain Assessment: 0-10 Pain Score: 5  Pain Location: R knee Pain Descriptors / Indicators: Aching, Sore, Grimacing Pain Intervention(s): Limited activity within patient's tolerance, Monitored during session, Premedicated before session, Repositioned, Ice applied    Home Living Family/patient expects to be discharged to:: Private residence Living Arrangements: Alone   Type of Home: House Home Access: Stairs to enter Entrance Stairs-Rails: Right;Left;Can reach both Entrance Stairs-Number of Steps: 2   Home Layout: One level Home Equipment: Conservation officer, nature (2 wheels);Shower seat      Prior Function Prior Level of Function : Independent/Modified Independent  Hand Dominance        Extremity/Trunk Assessment   Upper Extremity Assessment Upper Extremity Assessment: Overall WFL for tasks assessed    Lower Extremity Assessment Lower Extremity Assessment: RLE deficits/detail RLE Deficits / Details: anklw WFL,  knee extension and hip flesion 2+/5, limited by post op pain and weakness       Communication   Communication: No difficulties  Cognition Arousal/Alertness: Awake/alert Behavior During Therapy: WFL for tasks assessed/performed Overall Cognitive  Status: Within Functional Limits for tasks assessed                                          General Comments      Exercises Total Joint Exercises Ankle Circles/Pumps: AROM, Both, 5 reps Quad Sets: AROM, Both, 5 reps   Assessment/Plan    PT Assessment Patient needs continued PT services  PT Problem List Decreased strength;Decreased mobility;Decreased range of motion;Decreased activity tolerance;Decreased balance;Decreased knowledge of use of DME;Pain       PT Treatment Interventions DME instruction;Therapeutic exercise;Gait training;Stair training;Functional mobility training;Therapeutic activities;Patient/family education    PT Goals (Current goals can be found in the Care Plan section)  Acute Rehab PT Goals Patient Stated Goal: have less pain PT Goal Formulation: With patient Time For Goal Achievement: 12/16/21 Potential to Achieve Goals: Good    Frequency 7X/week     Co-evaluation               AM-PAC PT "6 Clicks" Mobility  Outcome Measure Help needed turning from your back to your side while in a flat bed without using bedrails?: A Little Help needed moving from lying on your back to sitting on the side of a flat bed without using bedrails?: A Little Help needed moving to and from a bed to a chair (including a wheelchair)?: A Little Help needed standing up from a chair using your arms (e.g., wheelchair or bedside chair)?: A Little Help needed to walk in hospital room?: A Little Help needed climbing 3-5 steps with a railing? : A Lot 6 Click Score: 17    End of Session Equipment Utilized During Treatment: Gait belt Activity Tolerance: Patient tolerated treatment well Patient left: in chair;with call bell/phone within reach;with chair alarm set Nurse Communication: Mobility status PT Visit Diagnosis: Other abnormalities of gait and mobility (R26.89);Difficulty in walking, not elsewhere classified (R26.2)    Time: 1038-1100 PT Time  Calculation (min) (ACUTE ONLY): 22 min   Charges:   PT Evaluation $PT Eval Low Complexity: [place], PT  Acute Rehab Dept [facility]) [phone]  [facility] Weekend Pager [facility] only)  [phone]  12/10/2021   [facility] 12/10/2021, 11:10 AM

## 2021-12-10 NOTE — Progress Notes (Signed)
12/10/21 1500  PT Visit Information  Last PT Received On 12/10/21  Assistance Needed Pt progressing toward goals. Kelly [name] is motivated to d/c home tomorrow. Discussed with pt we will make a plan after our am PT session  History of Present Illness 76 yo female s/p CONVERSION RIGHT UNI KNEE ARTHROPLASTY TO TOTAL KNEE ARTHROPLASTY on 12/09/21. PMH: L TKA wit multiple revisions, HTN, DM, anxiety and depression  Subjective Data  Patient Stated Goal have less pain  Precautions  Precautions Knee  Restrictions  Weight Bearing Restrictions No  RLE Weight Bearing WBAT  Pain Assessment  Pain Assessment 0-10  Pain Score 7  Pain Location R knee  Pain Descriptors / Indicators Aching;Sore;Grimacing  Pain Intervention(s) Limited activity within patient's tolerance;Monitored during session;Premedicated before session;Repositioned  Cognition  Arousal/Alertness Awake/alert  Behavior During Therapy WFL for tasks assessed/performed  Overall Cognitive Status Within Functional Limits for tasks assessed  Bed Mobility  Overal bed mobility Needs Assistance  Bed Mobility Sit to Supine  Sit to supine Min guard  General bed mobility comments for safety  Transfers  Overall transfer level Needs assistance  Equipment used Rolling walker (2 wheels)  Transfers Sit to/from Stand;Bed to chair/wheelchair/BSC  Sit to Stand Min guard  Bed to/from chair/wheelchair/BSC transfer type: Step pivot  Step pivot transfers Min guard  General transfer comment cues for hand placement, incr time  Ambulation/Gait  Ambulation/Gait assistance Min guard  Gait Distance (Feet) 40 Feet  Assistive device Rolling walker (2 wheels)  Gait Pattern/deviations Step-to pattern;Decreased weight shift to right  General Gait Details cues for sequence and posture  Total Joint Exercises  Heel Slides AAROM;Right;10 reps  PT - End of Session  Equipment Utilized During Treatment Gait belt  Activity Tolerance Patient tolerated treatment well   Patient left in bed;with call bell/phone within reach;with bed alarm set  Nurse Communication Mobility status   PT - Assessment/Plan  PT Plan Current plan remains appropriate  PT Visit Diagnosis Other abnormalities of gait and mobility (R26.89);Difficulty in walking, not elsewhere classified (R26.2)  PT Frequency (ACUTE ONLY) 7X/week  Follow Up Recommendations Follow physician's recommendations for discharge plan and follow up therapies  Assistance recommended at discharge Intermittent Supervision/Assistance  Patient can return home with the following A little help with bathing/dressing/bathroom;Assist for transportation;Help with stairs or ramp for entrance  PT equipment None recommended by PT  AM-PAC PT "6 Clicks" Mobility Outcome Measure (Version 2)  Help needed turning from your back to your side while in a flat bed without using bedrails? 3  Help needed moving from lying on your back to sitting on the side of a flat bed without using bedrails? 3  Help needed moving to and from a bed to a chair (including a wheelchair)? 3  Help needed standing up from a chair using your arms (e.g., wheelchair or bedside chair)? 3  Help needed to walk in hospital room? 3  Help needed climbing 3-5 steps with a railing?  3  6 Click Score 18  Consider Recommendation of Discharge To: Home with [facility]  Progressive Mobility  What is the highest level of mobility based on the progressive mobility assessment? Level 5 (Walks with assist in room/hall) - Balance while stepping forward/back and can walk in room with assist - Complete  Activity Ambulated with assistance in hallway  PT Goal Progression  Progress towards PT goals Progressing toward goals  Acute Rehab PT Goals  PT Goal Formulation With patient  Time For Goal Achievement 12/16/21  Potential to Achieve  Goals Good  PT Time Calculation  PT Start Time (ACUTE ONLY) 1444  PT Stop Time (ACUTE ONLY) 1518  PT Time Calculation (min) (ACUTE ONLY) 34 min  PT  General Charges  $$ ACUTE PT VISIT 1 Visit  PT Treatments  $Gait Training 8-22 mins  $Therapeutic Activity 8-22 mins

## 2021-12-10 NOTE — Progress Notes (Signed)
Subjective: 1 Day Post-Op Procedure(s) (LRB): CONVERT RIGHT UNI KNEE ARTHROPLASTY TO TOTAL KNEE ARTHROPLASTY (Right) Patient reports pain as moderate.    Objective: Vital signs in last 24 hours: Temp:  [97.5 F (36.4 C)-98.1 F (36.7 C)] 97.7 F (36.5 C) (10/28 0641) Pulse Rate:  [60-91] 66 (10/28 0641) Resp:  [9-21] 18 (10/28 0641) BP: (112-172)/(70-106) 125/72 (10/28 0641) SpO2:  [94 %-100 %] 100 % (10/28 0641) Weight:  [88.8 kg] 88.8 kg (10/27 1126)  Intake/Output from previous day: 10/27 0701 - 10/28 0700 In: 3678.1 [P.O.:640; I.V.:2333.2; IV Piggyback:704.9] Out: 1290 [Urine:1250; Blood:40] Intake/Output this shift: No intake/output data recorded.  Recent Labs    12/10/21 0344  HGB 11.4*   Recent Labs    12/10/21 0344  WBC 8.5  RBC 3.80*  HCT 34.4*  PLT 180   Recent Labs    12/10/21 0344  NA 134*  K 3.9  CL 103  CO2 25  BUN 16  CREATININE 1.28*  GLUCOSE 121*  CALCIUM 9.2   No results for input(s): "LABPT", "INR" in the last 72 hours.  Sensation intact distally Intact pulses distally Dorsiflexion/Plantar flexion intact Incision: dressing C/D/I No cellulitis present Compartment soft   Assessment/Plan: 1 Day Post-Op Procedure(s) (LRB): CONVERT RIGHT UNI KNEE ARTHROPLASTY TO TOTAL KNEE ARTHROPLASTY (Right) Up with therapy Plan for discharge tomorrow Discharge home with home health      Mcarthur Rossetti 12/10/2021, 8:27 AM

## 2021-12-11 NOTE — Plan of Care (Signed)
  Problem: Education: Goal: Knowledge of General Education information will improve Description Including pain rating scale, medication(s)/side effects and non-pharmacologic comfort measures Outcome: Progressing   

## 2021-12-11 NOTE — Progress Notes (Signed)
The patient is alert and oriented and has been seen by her physician. The orders for discharge were written. IV has been removed. Went over discharge instructions with patient. She is being discharged via wheelchair with all of her belongings.   

## 2021-12-11 NOTE — Progress Notes (Signed)
Patient has two walkers at home. Patient needs a bedside commode for home. Patient had right knee surgery. The bedside commode would aid with the patient's recovery from surgery and help reduce pain when rising and sitting down when activities of daily living are being performed.

## 2021-12-11 NOTE — Discharge Summary (Signed)
Patient ID: Kelly [name] MRN: [id number] DOB/AGE: 07-25-1945 76 y.o.  Admit date: 12/09/2021 Discharge date: 12/11/2021  Admission Diagnoses:  Principal Problem:   Arthritis of right knee Active Problems:   Status post revision of total knee, right   Discharge Diagnoses:  Same  Past Medical History:  Diagnosis Date   Arthritis    Cataract    Chronic kidney disease    Depression    Hyperlipidemia    Hypertension    Osteoporosis     Surgeries: Procedure(s): CONVERT RIGHT UNI KNEE ARTHROPLASTY TO TOTAL KNEE ARTHROPLASTY on 12/09/2021   Consultants:   Discharged Condition: Improved  Hospital Course: Kelly [name] is an 76 y.o. female who was admitted 12/09/2021 for operative treatment ofArthritis of right knee. Patient has severe unremitting pain that affects sleep, daily activities, and work/hobbies. After pre-op clearance the patient was taken to the operating room on 12/09/2021 and underwent  Procedure(s): CONVERT RIGHT UNI KNEE ARTHROPLASTY TO TOTAL KNEE ARTHROPLASTY.    Patient was given perioperative antibiotics:  Anti-infectives (From admission, onward)    Start     Dose/Rate Route Frequency Ordered Stop   12/09/21 1830  ceFAZolin (ANCEF) IVPB 1 g/50 mL premix        1 g 100 mL/hr over 30 Minutes Intravenous Every 6 hours 12/09/21 1743 12/10/21 0105   12/09/21 1100  ceFAZolin (ANCEF) IVPB 2g/100 mL premix        2 g 200 mL/hr over 30 Minutes Intravenous On call to O.R. 12/09/21 1054 12/09/21 1326        Patient was given sequential compression devices, early ambulation, and chemoprophylaxis to prevent DVT.  Patient benefited maximally from hospital stay and there were no complications.    Recent vital signs: Patient Vitals for the past 24 hrs:  BP Temp Temp src Pulse Resp SpO2  12/11/21 0540 133/74 98.6 F (37 C) -- 73 14 98 %  12/11/21 0151 -- -- -- -- -- 94 %  12/10/21 2053 133/72 99.1 F (37.3 C) Oral 71 16 (!) 89 %  12/10/21 2050 136/74  -- -- -- -- --  12/10/21 1355 (!) 145/132 97.9 F (36.6 C) Oral 65 18 97 %  12/10/21 0932 134/78 97.8 F (36.6 C) Oral 68 14 95 %     Recent laboratory studies:  Recent Labs    12/10/21 0344  WBC 8.5  HGB 11.4*  HCT 34.4*  PLT 180  NA 134*  K 3.9  CL 103  CO2 25  BUN 16  CREATININE 1.28*  GLUCOSE 121*  CALCIUM 9.2     Discharge Medications:   Allergies as of 12/11/2021   No Known Allergies      Medication List     TAKE these medications    amLODipine 5 MG tablet Commonly known as: NORVASC TAKE 1 TABLET BY MOUTH EVERY DAY   aspirin 81 MG chewable tablet Chew 1 tablet (81 mg total) by mouth 2 (two) times daily.   cholecalciferol 25 MCG (1000 UNIT) tablet Commonly known as: VITAMIN D3 Take 1,000 Units by mouth daily.   IRON PO Take 200 mg by mouth daily.   methocarbamol 500 MG tablet Commonly known as: ROBAXIN Take 1 tablet (500 mg total) by mouth every 6 (six) hours as needed for muscle spasms.   OMEGA-3 COMPLEX PO Take 1 capsule by mouth daily.   oxyCODONE 5 MG immediate release tablet Commonly known as: Oxy IR/ROXICODONE Take 1-2 tablets (5-10 mg total) by mouth every 4 (four) hours  as needed for moderate pain (pain score 4-6).   rosuvastatin 10 MG tablet Commonly known as: CRESTOR TAKE 1 TABLET(10 MG) BY MOUTH DAILY   vitamin B-12 500 MCG tablet Commonly known as: CYANOCOBALAMIN Take 500 mcg by mouth daily.               Durable Medical Equipment  (From admission, onward)           Start     Ordered   12/09/21 1744  DME 3 n 1  Once        12/09/21 1743   12/09/21 1744  DME Walker rolling  Once       Question Answer Comment  Walker: With 5 Inch Wheels   Patient needs a walker to treat with the following condition Status post revision of total knee, right      12/09/21 1743            Diagnostic Studies: DG Knee Right Port  Result Date: 12/09/2021 CLINICAL DATA:  Right knee arthroplasty revision. EXAM: PORTABLE  RIGHT KNEE - 1-2 VIEW COMPARISON:  Right knee x-rays dated Jun 16, 2021. FINDINGS: The right knee demonstrates a new total knee arthroplasty revision after medial unicompartmental knee arthroplasty removal. No evidence of hardware failure or complication. There is expected intra-articular air. There is no fracture or dislocation. The alignment is anatomic. Post-surgical changes noted in the surrounding soft tissues. IMPRESSION: 1. Right total knee arthroplasty revision without acute postoperative complication. Electronically Signed   By: Titus Dubin M.D.   On: 12/09/2021 16:41    Disposition:      Follow-up Information     Mcarthur Rossetti, MD Follow up in 2 week(s).   Specialty: Orthopedic Surgery Contact information: [address] [place] [address] [phone]                  Signed: Aundra Dubin 12/11/2021, 8:06 AM

## 2021-12-11 NOTE — Progress Notes (Signed)
Subjective: 2 Days Post-Op Procedure(s) (LRB): CONVERT RIGHT UNI KNEE ARTHROPLASTY TO TOTAL KNEE ARTHROPLASTY (Right) Patient reports pain as moderate.    Objective: Vital signs in last 24 hours: Temp:  [97.8 F (36.6 C)-99.1 F (37.3 C)] 98.6 F (37 C) (10/29 0540) Pulse Rate:  [65-73] 73 (10/29 0540) Resp:  [14-18] 14 (10/29 0540) BP: (133-145)/(72-132) 133/74 (10/29 0540) SpO2:  [89 %-98 %] 98 % (10/29 0540)  Intake/Output from previous day: 10/28 0701 - 10/29 0700 In: 1135.6 [P.O.:1080; I.V.:55.6] Out: 1950 [Urine:1950] Intake/Output this shift: No intake/output data recorded.  Recent Labs    12/10/21 0344  HGB 11.4*   Recent Labs    12/10/21 0344  WBC 8.5  RBC 3.80*  HCT 34.4*  PLT 180   Recent Labs    12/10/21 0344  NA 134*  K 3.9  CL 103  CO2 25  BUN 16  CREATININE 1.28*  GLUCOSE 121*  CALCIUM 9.2   No results for input(s): "LABPT", "INR" in the last 72 hours.  Neurologically intact Neurovascular intact Sensation intact distally Intact pulses distally Dorsiflexion/Plantar flexion intact Incision: scant drainage No cellulitis present Compartment soft   Assessment/Plan: 2 Days Post-Op Procedure(s) (LRB): CONVERT RIGHT UNI KNEE ARTHROPLASTY TO TOTAL KNEE ARTHROPLASTY (Right) Advance diet Up with therapy D/C IV fluids Discharge home with home health as long as pain is under control and she is cleared by PT WBAT RLE   Anticipated LOS equal to or greater than 2 midnights due to - Age 78 and older with one or more of the following:  - Obesity  - Expected need for hospital services (PT, OT, Nursing) required for safe  discharge  - Anticipated need for postoperative skilled nursing care or inpatient rehab  - Active co-morbidities: Diabetes OR   - Unanticipated findings during/Post Surgery: Slow post-op progression: GI, pain control, mobility  - Patient is a high risk of re-admission due to: None   Aundra Dubin 12/11/2021, 8:05 AM

## 2021-12-11 NOTE — TOC Transition Note (Signed)
Transition of Care [facility]) - CM/SW Discharge Note   Patient Details  Name: Kelly [name] MRN: [id number] Date of Birth: 1945-12-11  Transition of Care [facility]) CM/SW Contact:  Ross Ludwig, LCSW Phone Number: 12/11/2021, 10:38 AM   Clinical Narrative:     Patient will be going home with home health [facility] which was preplanned.  TOC signing off please reconsult with any other TOC needs, home health agency has been notified of planned discharge.  Patient needs a bedside commode, spoke to [facility] at [place] it will be delivered to room prior to patient discharging.    Final next level of care: [place] Barriers to Discharge: Barriers Resolved   Patient Goals and CMS Choice Patient states their goals for this hospitalization and ongoing recovery are:: To return back home with home health. CMS Medicare.gov Compare Post Acute Care list provided to:: Patient Choice offered to / list presented to : Patient  Discharge Placement                       Discharge Plan and Services                DME Arranged: Bedside commode   Date DME Agency Contacted: 12/11/21 Time DME Agency Contacted: [phone] Representative spoke with at DME Agency: [facility]. HH Arranged: PT HH Agency: [place]        Social Determinants of Health (SDOH) Interventions     Readmission Risk Interventions     No data to display

## 2021-12-11 NOTE — Progress Notes (Signed)
Physical Therapy Treatment Patient Details Name: Kelly [name] MRN: [id number] DOB: [birth year] Today's Date: 12/11/2021   History of Present Illness 76 yo female s/p CONVERSION RIGHT UNI KNEE ARTHROPLASTY TO TOTAL KNEE ARTHROPLASTY on 12/09/21. PMH: L TKA wit multiple revisions, HTN, DM, anxiety and depression    PT Comments    Pt meeting goals, reports she has some issues with pain earlier but improved during PT session.  Pt is ready to d/c from PT standpoint with assist of her friend as needed. Pt reports her friend/caregiver is a Marine scientist.  Recommendations for follow up therapy are one component of a multi-disciplinary discharge planning process, led by the attending physician.  Recommendations may be updated based on patient status, additional functional criteria and insurance authorization.  Follow Up Recommendations  Follow physician's recommendations for discharge plan and follow up therapies     Assistance Recommended at Discharge Intermittent Supervision/Assistance  Patient can return home with the following A little help with bathing/dressing/bathroom;Assist for transportation;Help with stairs or ramp for entrance   Equipment Recommendations  None recommended by PT    Recommendations for Other Services       Precautions / Restrictions Precautions Precautions: Knee Required Braces or Orthoses: Knee Immobilizer - Right Restrictions Weight Bearing Restrictions: No RLE Weight Bearing: Weight bearing as tolerated     Mobility  Bed Mobility Overal bed mobility: Needs Assistance Bed Mobility: Sit to Supine, Supine to Sit     Supine to sit: Min guard, Supervision Sit to supine: Min guard   General bed mobility comments: for safety. able to use gait belt as leg lifter    Transfers Overall transfer level: Needs assistance Equipment used: Rolling walker (2 wheels) Transfers: Sit to/from Stand, Bed to chair/wheelchair/BSC Sit to Stand: Min guard, Supervision            General transfer comment: cues for hand placement, incr time    Ambulation/Gait Ambulation/Gait assistance: Min guard, Supervision Gait Distance (Feet): 80 Feet Assistive device: Rolling walker (2 wheels) Gait Pattern/deviations: Step-to pattern, Decreased weight shift to right       General Gait Details: cues for sequence and posture. improved wt shift to RLE with incr distance. demonstrates carryover self correcting posture and RW position   Stairs Stairs: Yes Stairs assistance: Min guard Stair Management: Step to pattern, Two rails Number of Stairs: 3 General stair comments: cues for sequence and technique. good stability, no knee buckling   Wheelchair Mobility    Modified Rankin (Stroke Patients Only)       Balance                                            Cognition Arousal/Alertness: Awake/alert Behavior During Therapy: WFL for tasks assessed/performed Overall Cognitive Status: Within Functional Limits for tasks assessed                                          Exercises Total Joint Exercises Ankle Circles/Pumps: AROM, Right, 10 reps Knee Flexion: AAROM, Right, 5 reps, Seated    General Comments        Pertinent Vitals/Pain Pain Assessment Pain Assessment: 0-10 Pain Score: 6  Pain Location: R knee Pain Descriptors / Indicators: Aching, Sore, Grimacing Pain Intervention(s): Limited activity within patient's tolerance, Monitored during session, Premedicated  before session, Repositioned (declines ice)    Home Living                          Prior Function            PT Goals (current goals can now be found in the care plan section) Acute Rehab PT Goals Patient Stated Goal: have less pain PT Goal Formulation: With patient Time For Goal Achievement: 12/16/21 Potential to Achieve Goals: Good Progress towards PT goals: Progressing toward goals    Frequency    7X/week      PT Plan  Current plan remains appropriate    Co-evaluation              AM-PAC PT "6 Clicks" Mobility   Outcome Measure  Help needed turning from your back to your side while in a flat bed without using bedrails?: A Little Help needed moving from lying on your back to sitting on the side of a flat bed without using bedrails?: A Little Help needed moving to and from a bed to a chair (including a wheelchair)?: A Little Help needed standing up from a chair using your arms (e.g., wheelchair or bedside chair)?: A Little Help needed to walk in hospital room?: A Little Help needed climbing 3-5 steps with a railing? : A Little 6 Click Score: 18    End of Session Equipment Utilized During Treatment: Gait belt Activity Tolerance: Patient tolerated treatment well Patient left: in bed;with call bell/phone within reach;with bed alarm set Nurse Communication: Mobility status PT Visit Diagnosis: Other abnormalities of gait and mobility (R26.89);Difficulty in walking, not elsewhere classified (R26.2)     Time: 7209-4709 PT Time Calculation (min) (ACUTE ONLY): 37 min  Charges:  $Gait Training: 23-37 mins                     Baxter Flattery, PT  Acute Rehab Dept [facility]) [phone]  [facility] Weekend Pager [facility] only)  [phone]  12/11/2021    [facility] 12/11/2021, 10:42 AM

## 2021-12-12 ENCOUNTER — Telehealth: Payer: Self-pay

## 2021-12-12 ENCOUNTER — Encounter (HOSPITAL_COMMUNITY): Payer: Self-pay | Admitting: Orthopaedic Surgery

## 2021-12-12 DIAGNOSIS — Z7982 Long term (current) use of aspirin: Secondary | ICD-10-CM | POA: Diagnosis not present

## 2021-12-12 DIAGNOSIS — M5442 Lumbago with sciatica, left side: Secondary | ICD-10-CM | POA: Diagnosis not present

## 2021-12-12 DIAGNOSIS — J310 Chronic rhinitis: Secondary | ICD-10-CM | POA: Diagnosis not present

## 2021-12-12 DIAGNOSIS — Z9181 History of falling: Secondary | ICD-10-CM | POA: Diagnosis not present

## 2021-12-12 DIAGNOSIS — F419 Anxiety disorder, unspecified: Secondary | ICD-10-CM | POA: Diagnosis not present

## 2021-12-12 DIAGNOSIS — D509 Iron deficiency anemia, unspecified: Secondary | ICD-10-CM | POA: Diagnosis not present

## 2021-12-12 DIAGNOSIS — Z96651 Presence of right artificial knee joint: Secondary | ICD-10-CM | POA: Diagnosis not present

## 2021-12-12 DIAGNOSIS — E1169 Type 2 diabetes mellitus with other specified complication: Secondary | ICD-10-CM | POA: Diagnosis not present

## 2021-12-12 DIAGNOSIS — R7301 Impaired fasting glucose: Secondary | ICD-10-CM | POA: Diagnosis not present

## 2021-12-12 DIAGNOSIS — M1990 Unspecified osteoarthritis, unspecified site: Secondary | ICD-10-CM | POA: Diagnosis not present

## 2021-12-12 DIAGNOSIS — I129 Hypertensive chronic kidney disease with stage 1 through stage 4 chronic kidney disease, or unspecified chronic kidney disease: Secondary | ICD-10-CM | POA: Diagnosis not present

## 2021-12-12 DIAGNOSIS — K5900 Constipation, unspecified: Secondary | ICD-10-CM | POA: Diagnosis not present

## 2021-12-12 DIAGNOSIS — F32A Depression, unspecified: Secondary | ICD-10-CM | POA: Diagnosis not present

## 2021-12-12 DIAGNOSIS — Z471 Aftercare following joint replacement surgery: Secondary | ICD-10-CM | POA: Diagnosis not present

## 2021-12-12 DIAGNOSIS — E669 Obesity, unspecified: Secondary | ICD-10-CM | POA: Diagnosis not present

## 2021-12-12 DIAGNOSIS — G8929 Other chronic pain: Secondary | ICD-10-CM | POA: Diagnosis not present

## 2021-12-12 DIAGNOSIS — N3941 Urge incontinence: Secondary | ICD-10-CM | POA: Diagnosis not present

## 2021-12-12 DIAGNOSIS — M810 Age-related osteoporosis without current pathological fracture: Secondary | ICD-10-CM | POA: Diagnosis not present

## 2021-12-12 DIAGNOSIS — N183 Chronic kidney disease, stage 3 unspecified: Secondary | ICD-10-CM | POA: Diagnosis not present

## 2021-12-12 DIAGNOSIS — E785 Hyperlipidemia, unspecified: Secondary | ICD-10-CM | POA: Diagnosis not present

## 2021-12-12 NOTE — Anesthesia Postprocedure Evaluation (Signed)
Anesthesia Post Note  Patient: Kelly [name]  Procedure(s) Performed: Adonis Housekeeper RIGHT UNI KNEE ARTHROPLASTY TO TOTAL KNEE ARTHROPLASTY (Right: Knee)     Patient location during evaluation: PACU Anesthesia Type: General Level of consciousness: awake and alert Pain management: pain level controlled Vital Signs Assessment: post-procedure vital signs reviewed and stable Respiratory status: spontaneous breathing, nonlabored ventilation, respiratory function stable and patient connected to nasal cannula oxygen Cardiovascular status: blood pressure returned to baseline and stable Postop Assessment: no apparent nausea or vomiting Anesthetic complications: no   No notable events documented.  Last Vitals:  Vitals:   12/11/21 0540 12/11/21 0813  BP: 133/74 124/61  Pulse: 73 68  Resp: 14   Temp: 37 C   SpO2: 98%     Last Pain:  Vitals:   12/11/21 1325  TempSrc:   PainSc: 5                  Romney Compean S

## 2021-12-12 NOTE — Patient Outreach (Signed)
  Care Coordination TOC Note Transition Care Management Follow-up Telephone Call Date of discharge and from where: 12/11/21-[facility] Dx: arthritis-right knee s/p revision How have you been since you were released from the hospital? Patient reports she has been having a rough time. She has had several knee surgeries in the past but voices this one has hurt the most. She just took pain meds about 15 mins prior to RN CM call. She does voice that pain meds are helping and working but mornings are generally the hardest. She is doing ice therapy as well. She has caregiver in the home assisting her with care needs. Patient voices she has not had a BM. She is getting ready to eat oatmeal and banana for breakfast and cup of coffee which normally "gets her going." She is aware to contact MD for bowel regimen protocol and will call office today.  Any questions or concerns? No  Items Reviewed: Did the pt receive and understand the discharge instructions provided? Yes  Medications obtained and verified? Yes  Other? Yes post-op care Any new allergies since your discharge? No  Dietary orders reviewed? Yes Do you have support at home? Yes   Home Care and Equipment/Supplies: Were home health services ordered? yes If so, what is the name of the agency? [facility]  Has the agency set up a time to come to the patient's home? Yes-coming today between 4-5pm. Were any new equipment or medical supplies ordered?  Yes: [facility] What is the name of the medical supply agency? [facility] Were you able to get the supplies/equipment? yes Do you have any questions related to the use of the equipment or supplies? No  Functional Questionnaire: (I = Independent and D = Dependent) ADLs: A  Bathing/Dressing- A  Meal Prep- A  Eating- I  Maintaining continence- A  Transferring/Ambulation- A  Managing Meds- I  Follow up appointments reviewed:  PCP Hospital f/u appt confirmed? No  patient states she saw PCP  last week prior to surgery and does not need to follow up within 2wks.She will call office to alert them that she is back home. [facility] f/u appt confirmed? Yes  Scheduled to see Dr. Rush Farmer on 12/22/21 @ 2:15pm. Are transportation arrangements needed? No  If their condition worsens, is the pt aware to call PCP or go to the Emergency Dept.? Yes Was the patient provided with contact information for the PCP's office or ED? Yes Was to pt encouraged to call back with questions or concerns? Yes  SDOH assessments and interventions completed:   Yes  Care Coordination Interventions Activated:  Yes   Care Coordination Interventions:  Education provided    Encounter Outcome:  Pt. Visit Completed    Enzo Montgomery, RN,BSN,CCM [facility] Telephonic Care Management Coordinator Direct Phone: [phone] Toll Free: [phone] Fax: [phone]

## 2021-12-14 ENCOUNTER — Telehealth: Payer: Self-pay | Admitting: Orthopaedic Surgery

## 2021-12-14 ENCOUNTER — Telehealth: Payer: Self-pay

## 2021-12-14 ENCOUNTER — Other Ambulatory Visit: Payer: Self-pay | Admitting: Orthopaedic Surgery

## 2021-12-14 DIAGNOSIS — M5442 Lumbago with sciatica, left side: Secondary | ICD-10-CM | POA: Diagnosis not present

## 2021-12-14 DIAGNOSIS — Z471 Aftercare following joint replacement surgery: Secondary | ICD-10-CM | POA: Diagnosis not present

## 2021-12-14 DIAGNOSIS — N183 Chronic kidney disease, stage 3 unspecified: Secondary | ICD-10-CM | POA: Diagnosis not present

## 2021-12-14 DIAGNOSIS — M1990 Unspecified osteoarthritis, unspecified site: Secondary | ICD-10-CM | POA: Diagnosis not present

## 2021-12-14 DIAGNOSIS — G8929 Other chronic pain: Secondary | ICD-10-CM | POA: Diagnosis not present

## 2021-12-14 DIAGNOSIS — I129 Hypertensive chronic kidney disease with stage 1 through stage 4 chronic kidney disease, or unspecified chronic kidney disease: Secondary | ICD-10-CM | POA: Diagnosis not present

## 2021-12-14 MED ORDER — OXYCODONE HCL 5 MG PO TABS: 5.0000 mg | ORAL_TABLET | Freq: Four times a day (QID) | ORAL | 0 refills | Status: DC | PRN

## 2021-12-14 MED ORDER — OXYCODONE HCL 5 MG PO TABS: 5.0000 mg | ORAL_TABLET | ORAL | 0 refills | Status: DC | PRN

## 2021-12-14 NOTE — Telephone Encounter (Signed)
We need to change Rx, insurance will not approve 1-2 every 4 hours

## 2021-12-14 NOTE — Telephone Encounter (Signed)
Steph (Materials engineer)  called from [facility] called with updates. Pt has not had a bowel movement in 6 days, pt still has 8 out of 10 scale for pain and pt also need a refill of pain medications. Please send to pharmacy on file. Steph phone number is [phone].

## 2021-12-14 NOTE — Telephone Encounter (Signed)
I called and advised pt. She stated understanding  

## 2021-12-16 DIAGNOSIS — N183 Chronic kidney disease, stage 3 unspecified: Secondary | ICD-10-CM | POA: Diagnosis not present

## 2021-12-16 DIAGNOSIS — Z471 Aftercare following joint replacement surgery: Secondary | ICD-10-CM | POA: Diagnosis not present

## 2021-12-16 DIAGNOSIS — M1990 Unspecified osteoarthritis, unspecified site: Secondary | ICD-10-CM | POA: Diagnosis not present

## 2021-12-16 DIAGNOSIS — I129 Hypertensive chronic kidney disease with stage 1 through stage 4 chronic kidney disease, or unspecified chronic kidney disease: Secondary | ICD-10-CM | POA: Diagnosis not present

## 2021-12-16 DIAGNOSIS — G8929 Other chronic pain: Secondary | ICD-10-CM | POA: Diagnosis not present

## 2021-12-16 DIAGNOSIS — M5442 Lumbago with sciatica, left side: Secondary | ICD-10-CM | POA: Diagnosis not present

## 2021-12-18 DIAGNOSIS — N183 Chronic kidney disease, stage 3 unspecified: Secondary | ICD-10-CM | POA: Diagnosis not present

## 2021-12-18 DIAGNOSIS — M5442 Lumbago with sciatica, left side: Secondary | ICD-10-CM | POA: Diagnosis not present

## 2021-12-18 DIAGNOSIS — M1990 Unspecified osteoarthritis, unspecified site: Secondary | ICD-10-CM | POA: Diagnosis not present

## 2021-12-18 DIAGNOSIS — G8929 Other chronic pain: Secondary | ICD-10-CM | POA: Diagnosis not present

## 2021-12-18 DIAGNOSIS — I129 Hypertensive chronic kidney disease with stage 1 through stage 4 chronic kidney disease, or unspecified chronic kidney disease: Secondary | ICD-10-CM | POA: Diagnosis not present

## 2021-12-18 DIAGNOSIS — Z471 Aftercare following joint replacement surgery: Secondary | ICD-10-CM | POA: Diagnosis not present

## 2021-12-19 ENCOUNTER — Other Ambulatory Visit: Payer: Self-pay | Admitting: Orthopaedic Surgery

## 2021-12-19 MED ORDER — OXYCODONE HCL 5 MG PO TABS: 5.0000 mg | ORAL_TABLET | Freq: Four times a day (QID) | ORAL | 0 refills | Status: DC | PRN

## 2021-12-19 NOTE — Telephone Encounter (Signed)
Pt call transferred to triage due to pt very upset and in pain per call center

## 2021-12-19 NOTE — Telephone Encounter (Signed)
Patient aware and is very upset crying due to pain, she has stopped taking her pain medicine because she hasn't had a BM, I told her to start taking her oxycodone again and have someone go pick her up some MagCitrate at the pharmacy Take a half of bottle today and the other half tomorrow if needed  She verbalized understanding

## 2021-12-20 DIAGNOSIS — M5442 Lumbago with sciatica, left side: Secondary | ICD-10-CM | POA: Diagnosis not present

## 2021-12-20 DIAGNOSIS — N183 Chronic kidney disease, stage 3 unspecified: Secondary | ICD-10-CM | POA: Diagnosis not present

## 2021-12-20 DIAGNOSIS — Z471 Aftercare following joint replacement surgery: Secondary | ICD-10-CM | POA: Diagnosis not present

## 2021-12-20 DIAGNOSIS — M1990 Unspecified osteoarthritis, unspecified site: Secondary | ICD-10-CM | POA: Diagnosis not present

## 2021-12-20 DIAGNOSIS — G8929 Other chronic pain: Secondary | ICD-10-CM | POA: Diagnosis not present

## 2021-12-20 DIAGNOSIS — I129 Hypertensive chronic kidney disease with stage 1 through stage 4 chronic kidney disease, or unspecified chronic kidney disease: Secondary | ICD-10-CM | POA: Diagnosis not present

## 2021-12-21 DIAGNOSIS — G8929 Other chronic pain: Secondary | ICD-10-CM | POA: Diagnosis not present

## 2021-12-21 DIAGNOSIS — I129 Hypertensive chronic kidney disease with stage 1 through stage 4 chronic kidney disease, or unspecified chronic kidney disease: Secondary | ICD-10-CM | POA: Diagnosis not present

## 2021-12-21 DIAGNOSIS — Z471 Aftercare following joint replacement surgery: Secondary | ICD-10-CM | POA: Diagnosis not present

## 2021-12-21 DIAGNOSIS — M1990 Unspecified osteoarthritis, unspecified site: Secondary | ICD-10-CM | POA: Diagnosis not present

## 2021-12-21 DIAGNOSIS — N183 Chronic kidney disease, stage 3 unspecified: Secondary | ICD-10-CM | POA: Diagnosis not present

## 2021-12-21 DIAGNOSIS — M5442 Lumbago with sciatica, left side: Secondary | ICD-10-CM | POA: Diagnosis not present

## 2021-12-22 ENCOUNTER — Encounter: Payer: Self-pay | Admitting: Orthopaedic Surgery

## 2021-12-22 ENCOUNTER — Ambulatory Visit (INDEPENDENT_AMBULATORY_CARE_PROVIDER_SITE_OTHER): Payer: Medicare Other | Admitting: Orthopaedic Surgery

## 2021-12-22 DIAGNOSIS — Z96651 Presence of right artificial knee joint: Secondary | ICD-10-CM

## 2021-12-22 NOTE — Progress Notes (Signed)
The patient is here for first postoperative visit status post a revision arthroplasty of her right knee.  We revised a partial knee arthroplasty to a total knee arthroplasty.  She has home health therapy coming to her house.  She is 76 years old.  She is unable to get outpatient therapy so she needs home therapy extended for at least 2 more weeks.  She cannot drive being at this her right knee and she has no assistance.  She has been compliant with a baby aspirin twice a day.  On exam her extension is almost full but her flexion is only to about 75 degrees.  She understands that outpatient physical therapy would be better for her but very there is no ability for her to do that just yet.  I did give her notes to get back to home therapy to extend it for 2 more weeks.  We will fill her pain medications when she needs them.  She can stop her aspirin.  She should try to take some Aleve with a snack at bedtime which may help with inflammation and getting better rest.  We will see her back in 2 weeks to see how she is doing from a mobility standpoint and range of motion standpoint.

## 2021-12-23 DIAGNOSIS — M1990 Unspecified osteoarthritis, unspecified site: Secondary | ICD-10-CM | POA: Diagnosis not present

## 2021-12-23 DIAGNOSIS — I129 Hypertensive chronic kidney disease with stage 1 through stage 4 chronic kidney disease, or unspecified chronic kidney disease: Secondary | ICD-10-CM | POA: Diagnosis not present

## 2021-12-23 DIAGNOSIS — G8929 Other chronic pain: Secondary | ICD-10-CM | POA: Diagnosis not present

## 2021-12-23 DIAGNOSIS — M5442 Lumbago with sciatica, left side: Secondary | ICD-10-CM | POA: Diagnosis not present

## 2021-12-23 DIAGNOSIS — N183 Chronic kidney disease, stage 3 unspecified: Secondary | ICD-10-CM | POA: Diagnosis not present

## 2021-12-23 DIAGNOSIS — Z471 Aftercare following joint replacement surgery: Secondary | ICD-10-CM | POA: Diagnosis not present

## 2021-12-26 DIAGNOSIS — G8929 Other chronic pain: Secondary | ICD-10-CM | POA: Diagnosis not present

## 2021-12-26 DIAGNOSIS — Z471 Aftercare following joint replacement surgery: Secondary | ICD-10-CM | POA: Diagnosis not present

## 2021-12-26 DIAGNOSIS — I129 Hypertensive chronic kidney disease with stage 1 through stage 4 chronic kidney disease, or unspecified chronic kidney disease: Secondary | ICD-10-CM | POA: Diagnosis not present

## 2021-12-26 DIAGNOSIS — M5442 Lumbago with sciatica, left side: Secondary | ICD-10-CM | POA: Diagnosis not present

## 2021-12-26 DIAGNOSIS — N183 Chronic kidney disease, stage 3 unspecified: Secondary | ICD-10-CM | POA: Diagnosis not present

## 2021-12-26 DIAGNOSIS — M1990 Unspecified osteoarthritis, unspecified site: Secondary | ICD-10-CM | POA: Diagnosis not present

## 2021-12-28 DIAGNOSIS — Z471 Aftercare following joint replacement surgery: Secondary | ICD-10-CM | POA: Diagnosis not present

## 2021-12-28 DIAGNOSIS — N183 Chronic kidney disease, stage 3 unspecified: Secondary | ICD-10-CM | POA: Diagnosis not present

## 2021-12-28 DIAGNOSIS — M1990 Unspecified osteoarthritis, unspecified site: Secondary | ICD-10-CM | POA: Diagnosis not present

## 2021-12-28 DIAGNOSIS — I129 Hypertensive chronic kidney disease with stage 1 through stage 4 chronic kidney disease, or unspecified chronic kidney disease: Secondary | ICD-10-CM | POA: Diagnosis not present

## 2021-12-28 DIAGNOSIS — G8929 Other chronic pain: Secondary | ICD-10-CM | POA: Diagnosis not present

## 2021-12-28 DIAGNOSIS — M5442 Lumbago with sciatica, left side: Secondary | ICD-10-CM | POA: Diagnosis not present

## 2021-12-30 DIAGNOSIS — I129 Hypertensive chronic kidney disease with stage 1 through stage 4 chronic kidney disease, or unspecified chronic kidney disease: Secondary | ICD-10-CM | POA: Diagnosis not present

## 2021-12-30 DIAGNOSIS — G8929 Other chronic pain: Secondary | ICD-10-CM | POA: Diagnosis not present

## 2021-12-30 DIAGNOSIS — M5442 Lumbago with sciatica, left side: Secondary | ICD-10-CM | POA: Diagnosis not present

## 2021-12-30 DIAGNOSIS — M1990 Unspecified osteoarthritis, unspecified site: Secondary | ICD-10-CM | POA: Diagnosis not present

## 2021-12-30 DIAGNOSIS — N183 Chronic kidney disease, stage 3 unspecified: Secondary | ICD-10-CM | POA: Diagnosis not present

## 2021-12-30 DIAGNOSIS — Z471 Aftercare following joint replacement surgery: Secondary | ICD-10-CM | POA: Diagnosis not present

## 2022-01-02 DIAGNOSIS — Z471 Aftercare following joint replacement surgery: Secondary | ICD-10-CM | POA: Diagnosis not present

## 2022-01-02 DIAGNOSIS — G8929 Other chronic pain: Secondary | ICD-10-CM | POA: Diagnosis not present

## 2022-01-02 DIAGNOSIS — N183 Chronic kidney disease, stage 3 unspecified: Secondary | ICD-10-CM | POA: Diagnosis not present

## 2022-01-02 DIAGNOSIS — M1990 Unspecified osteoarthritis, unspecified site: Secondary | ICD-10-CM | POA: Diagnosis not present

## 2022-01-02 DIAGNOSIS — I129 Hypertensive chronic kidney disease with stage 1 through stage 4 chronic kidney disease, or unspecified chronic kidney disease: Secondary | ICD-10-CM | POA: Diagnosis not present

## 2022-01-02 DIAGNOSIS — M5442 Lumbago with sciatica, left side: Secondary | ICD-10-CM | POA: Diagnosis not present

## 2022-01-03 ENCOUNTER — Telehealth: Payer: Self-pay | Admitting: Orthopaedic Surgery

## 2022-01-03 DIAGNOSIS — M1990 Unspecified osteoarthritis, unspecified site: Secondary | ICD-10-CM | POA: Diagnosis not present

## 2022-01-03 DIAGNOSIS — I129 Hypertensive chronic kidney disease with stage 1 through stage 4 chronic kidney disease, or unspecified chronic kidney disease: Secondary | ICD-10-CM | POA: Diagnosis not present

## 2022-01-03 DIAGNOSIS — Z471 Aftercare following joint replacement surgery: Secondary | ICD-10-CM | POA: Diagnosis not present

## 2022-01-03 DIAGNOSIS — M5442 Lumbago with sciatica, left side: Secondary | ICD-10-CM | POA: Diagnosis not present

## 2022-01-03 DIAGNOSIS — G8929 Other chronic pain: Secondary | ICD-10-CM | POA: Diagnosis not present

## 2022-01-03 DIAGNOSIS — N183 Chronic kidney disease, stage 3 unspecified: Secondary | ICD-10-CM | POA: Diagnosis not present

## 2022-01-03 NOTE — Telephone Encounter (Signed)
[name] (PTA) called from [facility] called with an FYI on pt. Pt reading for pain today is a 9 out of 10. Pt is experiencing soreness after shower. Pain is affecting pt's walking. Pt has an appt tomorrow. If any questions call [name] at [phone].

## 2022-01-04 ENCOUNTER — Other Ambulatory Visit: Payer: Self-pay

## 2022-01-04 ENCOUNTER — Ambulatory Visit (INDEPENDENT_AMBULATORY_CARE_PROVIDER_SITE_OTHER): Payer: Medicare Other | Admitting: Orthopaedic Surgery

## 2022-01-04 ENCOUNTER — Encounter: Payer: Self-pay | Admitting: Orthopaedic Surgery

## 2022-01-04 DIAGNOSIS — Z96651 Presence of right artificial knee joint: Secondary | ICD-10-CM

## 2022-01-04 NOTE — Progress Notes (Signed)
The patient is now almost 4 weeks status post revision arthroplasty of her right knee.  This was conversion of a partial knee to a total knee arthroplasty.  She is 76 years old.  She is only had home therapy.  At her last visit with me she could flex to maybe just past 70 degrees.  Her notes from therapy said they have been able to get her to at least 88 to 89 degrees.  This is all been through home therapy.  She understands the importance of [place] getting her set up for outpatient therapy.  On my exam today the knee is still swollen to be expected but the incision looks good.  She lacks full extension by few degrees and I can flex her to about 85 degrees.  We will set her up for outpatient physical therapy and I will see her back in about 3 weeks for repeat exam but no x-rays are needed.

## 2022-01-06 DIAGNOSIS — N183 Chronic kidney disease, stage 3 unspecified: Secondary | ICD-10-CM | POA: Diagnosis not present

## 2022-01-06 DIAGNOSIS — Z471 Aftercare following joint replacement surgery: Secondary | ICD-10-CM | POA: Diagnosis not present

## 2022-01-06 DIAGNOSIS — M5442 Lumbago with sciatica, left side: Secondary | ICD-10-CM | POA: Diagnosis not present

## 2022-01-06 DIAGNOSIS — M1990 Unspecified osteoarthritis, unspecified site: Secondary | ICD-10-CM | POA: Diagnosis not present

## 2022-01-06 DIAGNOSIS — G8929 Other chronic pain: Secondary | ICD-10-CM | POA: Diagnosis not present

## 2022-01-06 DIAGNOSIS — I129 Hypertensive chronic kidney disease with stage 1 through stage 4 chronic kidney disease, or unspecified chronic kidney disease: Secondary | ICD-10-CM | POA: Diagnosis not present

## 2022-01-09 ENCOUNTER — Ambulatory Visit (INDEPENDENT_AMBULATORY_CARE_PROVIDER_SITE_OTHER): Payer: Medicare Other | Admitting: Physical Therapy

## 2022-01-09 ENCOUNTER — Encounter: Payer: Self-pay | Admitting: Physical Therapy

## 2022-01-09 DIAGNOSIS — M25561 Pain in right knee: Secondary | ICD-10-CM

## 2022-01-09 DIAGNOSIS — R262 Difficulty in walking, not elsewhere classified: Secondary | ICD-10-CM

## 2022-01-09 DIAGNOSIS — M25661 Stiffness of right knee, not elsewhere classified: Secondary | ICD-10-CM | POA: Diagnosis not present

## 2022-01-09 DIAGNOSIS — R600 Localized edema: Secondary | ICD-10-CM

## 2022-01-09 NOTE — Therapy (Signed)
OUTPATIENT PHYSICAL THERAPY LOWER EXTREMITY EVALUATION   Patient Name: Kelly [name] MRN: [id number] DOB:28-Nov-1945, 76 y.o., female Today's Date: 01/09/2022  END OF SESSION:  PT End of Session - 01/09/22 1531     Visit Number 1    Number of Visits 28    Date for PT Re-Evaluation 04/07/22    Authorization Type Medicare/BCBS    Progress Note Due on Visit 10    PT Start Time 1515    PT Stop Time 1600    PT Time Calculation (min) 45 min    Activity Tolerance Patient tolerated treatment well    Behavior During Therapy [facility] for tasks assessed/performed             Past Medical History:  Diagnosis Date   Arthritis    Cataract    Chronic kidney disease    Depression    Hyperlipidemia    Hypertension    Osteoporosis    Past Surgical History:  Procedure Laterality Date   ABDOMINAL HYSTERECTOMY     BREAST REDUCTION SURGERY     and lift   BREAST REDUCTION SURGERY Bilateral    CARPAL TUNNEL RELEASE  06/2009   right   CATARACT EXTRACTION     CHOLECYSTECTOMY     FACIAL COSMETIC SURGERY  03/2008   KNEE ARTHROSCOPY  2007   left & right   LIPOSUCTION EXTREMITIES     thighs   PARTIAL KNEE ARTHROPLASTY Right 10/02/2014   Procedure: RIGHT KNEE MEDIAL UNICOMPARTMENTAL ARTHROPLASTY;  Surgeon: Mcarthur Rossetti, MD;  Location: [facility];  Service: Orthopedics;  Laterality: Right;   REDUCTION MAMMAPLASTY     bilateral   TOTAL KNEE REVISION  10/12/2011   Procedure: TOTAL KNEE REVISION;  Surgeon: Mcarthur Rossetti, MD;  Location: [facility];  Service: Orthopedics;  Laterality: Left;  Left Total Knee Revision Arthroplasty   TOTAL KNEE REVISION Right 12/09/2021   Procedure: CONVERT RIGHT UNI KNEE ARTHROPLASTY TO TOTAL KNEE ARTHROPLASTY;  Surgeon: Mcarthur Rossetti, MD;  Location: [facility];  Service: Orthopedics;  Laterality: Right;   TRIGGER FINGER RELEASE  06/2009   right 4th finger   Patient Active Problem List   Diagnosis Date Noted   Status post revision of total  knee, right 12/09/2021   Arthritis of right knee 12/08/2021   Chronic left-sided low back pain with left-sided sciatica 12/17/2019   Diabetes mellitus type 2 in obese ([place]) 12/13/2019   Chronic rhinitis 03/03/2019   Constipation 10/31/2016   Impaired glucose tolerance 10/31/2016   Stage III chronic kidney disease ([place]) 10/31/2016   Urge urinary incontinence 10/31/2016   Status post right partial knee replacement 10/02/2014   Anxiety and depression 11/06/2011   Hypertension 10/13/2010   Hyperlipidemia 10/13/2010   Osteoarthritis 10/13/2010   UNSPECIFIED IRON DEFICIENCY ANEMIA 04/11/2010   DIARRHEA 04/11/2010    PCP: Elby Showers MD  REFERRING PROVIDER: Mcarthur Rossetti, MD  REFERRING DIAG: [phone] (ICD-10-CM) - Status post revision of total knee, right  THERAPY DIAG:  Acute pain of right knee  Difficulty in walking, not elsewhere classified  Stiffness of right knee, not elsewhere classified  Localized edema  Rationale for Evaluation and Treatment: Rehabilitation  ONSET DATE: 12/09/21: conversion of Rt UNI knee arthroplasty to Rt total knee arthroplasty  SUBJECTIVE:    SUBJECTIVE STATEMENT: Pt arriving stating pain in Rt knee was 5/10. Pt stating she just finished HHPT on Friday which lasted 2 weeks. Pt stating she has a stationary bike at home and she has been  riding it daily.   PERTINENT HISTORY:  Rt TKA 12/09/21  Rt knee surgery: uni knee arthroplasty injection 09/16/2020   hyperlipidemia, depression, HTN, osteoporosis PAIN:  NPRS scale: 5/10 Pain location: Rt knee Pain description: achy Aggravating factors: bending, standing squatting Relieving factors: resting, muscle relaxor  PRECAUTIONS: None  WEIGHT BEARING RESTRICTIONS: No  FALLS:  Has patient fallen in last 6 months? No  LIVING ENVIRONMENT: Lives with: lives with their family and lives alone Lives in: House/apartment Stairs: Yes: External: 5 steps; can reach both Has following equipment  at home: Single point cane, grab bars in shower  OCCUPATION: retired  PLOF: Independent  PATIENT GOALS: Walk with no device, get back to gym workouts     OBJECTIVE:   DIAGNOSTIC FINDINGS: 12/09/21 IMPRESSION: 1. Right total knee arthroplasty revision without acute postoperative complication.images following TKA   PATIENT SURVEYS:  01/09/22: FOTO intake:  46%  COGNITION: Overall cognitive status: WFL    SENSATION: WFL:   EDEMA:  01/09/22: Circumferential: Rt:  50 centimeters                           Left: 46 centimeters MUSCLE LENGTH: 01/09/22: Hamstrings: Right 75 deg; Left 84 deg   POSTURE: rounded shoulders and forward head  PALPATION: 01/09/22: TTP: both sides of incision site on Rt knee   LOWER EXTREMITY ROM:   ROM A: active, P: passive Right 01/09/22 Left 01/09/22  Hip flexion    Knee flexion A: 76 P: 80 A: 94  Knee extension A: -10 P: -8 A: -2   (Blank rows = not tested)  LOWER EXTREMITY MMT:  MMT Right 01/09/22 Left 01/09/22  Hip flexion 4-/5 4-/5  Hip extension    Hip abduction 4/5 4/5  Hip adduction 4/5 4/5  Hip internal rotation    Hip external rotation    Knee flexion 3+/5 5/5  Knee extension 3+/5 5/5   (Blank rows = not tested)   FUNCTIONAL TESTS:  01/09/22: 5 time sit to stand: 17 seconds c UE support  GAIT: Distance walked: 20 feet Assistive device utilized: Single point cane Level of assistance: Modified independence Comments: forward  trunk flexion, antalgic gait pattern, increased knee flexion on Rt   TODAY'S TREATMENT                                                                          DATE: Therex:    HEP instruction/performance c cues for techniques, handout provided.  Trial set performed of each for comprehension and symptom assessment.  See below for exercise list  PATIENT EDUCATION:  Education details: HEP, POC Person educated: Patient Education method: Explanation, Demonstration, Verbal cues, and  Handouts Education comprehension: verbalized understanding, returned demonstration, and verbal cues required  HOME EXERCISE PROGRAM: Access Code: [email or web address] URL: [email or web address] Date: 01/09/2022 Prepared by: Kearney Hard  Exercises - Sit to Stand  - 3 x daily - 7 x weekly - 2 sets - 10 reps - Seated Long Arc Quad  - 3 x daily - 7 x weekly - 2 sets - 10 reps - 5 seconds hold - Supine Heel Slide with Strap  - 3 x daily - 7 x weekly - 2 sets -  10 reps - Supine Straight Leg Raises  - 3 x daily - 7 x weekly - 2 sets - 10 reps - Supine Quad Set  - 3 x daily - 7 x weekly - 2 sets - 10 reps - 5 seconds hold   ASSESSMENT:  CLINICAL IMPRESSION: Patient is a 76 y.o. who comes to clinic with complaints of Rt knee pain s/p TKA revision with mobility, strength and movement coordination deficits that impair their ability to perform usual daily and recreational functional activities without increase difficulty/symptoms at this time.  Patient to benefit from skilled PT services to address impairments and limitations to improve to previous level of function without restriction secondary to condition.   OBJECTIVE IMPAIRMENTS: Abnormal gait, decreased activity tolerance, decreased balance, decreased mobility, difficulty walking, decreased ROM, decreased strength, increased edema, impaired flexibility, and pain.   ACTIVITY LIMITATIONS: lifting, bending, sitting, standing, squatting, sleeping, stairs, and transfers  PARTICIPATION LIMITATIONS: cleaning, driving, and community activity  PERSONAL FACTORS: 3+ comorbidities: see above  are also affecting patient's functional outcome.   REHAB POTENTIAL: Good  CLINICAL DECISION MAKING: Stable/uncomplicated  EVALUATION COMPLEXITY: Low   GOALS: Goals reviewed with patient? Yes  SHORT TERM GOALS: (target date for Short term goals are 3 weeks 02/03/22)   1.  Patient will demonstrate independent use of home exercise program to maintain  progress from in clinic treatments.  Goal status: New  2.  Pt will be able to perform 5 time sit to stand in </= 12 seconds c/s UE support.     Goal status: New  LONG TERM GOALS: (target dates for all long term goals are 12 weeks  04/07/22 )   1. Patient will demonstrate/report pain at worst less than or equal to 2/10 to facilitate minimal limitation in daily activity secondary to pain symptoms.  Goal status: New   2. Patient will demonstrate independent use of home exercise program to facilitate ability to maintain/progress functional gains from skilled physical therapy services.  Goal status: New   3. Patient will demonstrate FOTO outcome > or = 56 % to indicate reduced disability due to condition.  Goal status: New   4.  Patient will demonstrate Rt  LE MMT >/= 4+/5 throughout to faciltiate usual transfers, stairs, squatting at [facility] for daily life.  Goal status: New   5.  Patient will be able to amb > 800 feet with LRAD with normalized gait pattern on community surfaces.   Goal status: New   6.  Pt will be able to navigate up and down 5 stairs with single hand rail with step over step pattern.   Goal status: New  7. Pt will improve her Rt knee flexion to >/= 100 degrees for improved functional mobility.    Goal status: New      PLAN:  PT FREQUENCY: 2-3x/ week   PT DURATION: 12 weeks  PLANNED INTERVENTIONS: Therapeutic exercises, Therapeutic activity, Neuro Muscular re-education, Balance training, Gait training, Patient/Family education, Joint mobilization, Stair training, DME instructions, Dry Needling, Electrical stimulation, Traction, Cryotherapy, vasopneumatic deviceMoist heat, Taping, Ultrasound, Ionotophoresis '4mg'$ /ml Dexamethasone, and Manual therapy.  All included unless contraindicated  PLAN FOR NEXT SESSION: Review HEP knowledge/results, Nustep,  LE ROM, strengthening, functional mobility, manual therapy, vasopneumatic     Oretha Caprice, PT,  MPT 01/09/2022, 4:10 PM

## 2022-01-10 DIAGNOSIS — R350 Frequency of micturition: Secondary | ICD-10-CM | POA: Diagnosis not present

## 2022-01-11 ENCOUNTER — Ambulatory Visit (INDEPENDENT_AMBULATORY_CARE_PROVIDER_SITE_OTHER): Payer: Medicare Other | Admitting: Physical Therapy

## 2022-01-11 ENCOUNTER — Other Ambulatory Visit: Payer: Self-pay | Admitting: Orthopaedic Surgery

## 2022-01-11 ENCOUNTER — Telehealth: Payer: Self-pay | Admitting: Orthopaedic Surgery

## 2022-01-11 ENCOUNTER — Encounter: Payer: Self-pay | Admitting: Physical Therapy

## 2022-01-11 DIAGNOSIS — M25661 Stiffness of right knee, not elsewhere classified: Secondary | ICD-10-CM | POA: Diagnosis not present

## 2022-01-11 DIAGNOSIS — R262 Difficulty in walking, not elsewhere classified: Secondary | ICD-10-CM

## 2022-01-11 DIAGNOSIS — R600 Localized edema: Secondary | ICD-10-CM | POA: Diagnosis not present

## 2022-01-11 DIAGNOSIS — M25561 Pain in right knee: Secondary | ICD-10-CM

## 2022-01-11 MED ORDER — METHOCARBAMOL 500 MG PO TABS: 500.0000 mg | ORAL_TABLET | Freq: Four times a day (QID) | ORAL | 1 refills | Status: DC | PRN

## 2022-01-11 NOTE — Therapy (Signed)
OUTPATIENT PHYSICAL THERAPY TREATMENT NOTE   Patient Name: Kelly [name] MRN: [id number] DOB:05-07-1945, 76 y.o., female Today's Date: 01/11/2022  PCP: Elby Showers MD  REFERRING PROVIDER: Mcarthur Rossetti, MD    END OF SESSION:   PT End of Session - 01/11/22 1102     Visit Number 2    Number of Visits 28    Date for PT Re-Evaluation 04/07/22    Authorization Type Medicare/BCBS    Progress Note Due on Visit 10    PT Start Time 1103    PT Stop Time 1143    PT Time Calculation (min) 40 min    Activity Tolerance Patient tolerated treatment well    Behavior During Therapy WFL for tasks assessed/performed             Past Medical History:  Diagnosis Date   Arthritis    Cataract    Chronic kidney disease    Depression    Hyperlipidemia    Hypertension    Osteoporosis    Past Surgical History:  Procedure Laterality Date   ABDOMINAL HYSTERECTOMY     BREAST REDUCTION SURGERY     and lift   BREAST REDUCTION SURGERY Bilateral    CARPAL TUNNEL RELEASE  06/2009   right   CATARACT EXTRACTION     CHOLECYSTECTOMY     FACIAL COSMETIC SURGERY  03/2008   KNEE ARTHROSCOPY  2007   left & right   LIPOSUCTION EXTREMITIES     thighs   PARTIAL KNEE ARTHROPLASTY Right 10/02/2014   Procedure: RIGHT KNEE MEDIAL UNICOMPARTMENTAL ARTHROPLASTY;  Surgeon: Mcarthur Rossetti, MD;  Location: [facility];  Service: Orthopedics;  Laterality: Right;   REDUCTION MAMMAPLASTY     bilateral   TOTAL KNEE REVISION  10/12/2011   Procedure: TOTAL KNEE REVISION;  Surgeon: Mcarthur Rossetti, MD;  Location: [facility];  Service: Orthopedics;  Laterality: Left;  Left Total Knee Revision Arthroplasty   TOTAL KNEE REVISION Right 12/09/2021   Procedure: CONVERT RIGHT UNI KNEE ARTHROPLASTY TO TOTAL KNEE ARTHROPLASTY;  Surgeon: Mcarthur Rossetti, MD;  Location: [facility];  Service: Orthopedics;  Laterality: Right;   TRIGGER FINGER RELEASE  06/2009   right 4th finger   Patient Active  Problem List   Diagnosis Date Noted   Status post revision of total knee, right 12/09/2021   Arthritis of right knee 12/08/2021   Chronic left-sided low back pain with left-sided sciatica 12/17/2019   Diabetes mellitus type 2 in obese ([place]) 12/13/2019   Chronic rhinitis 03/03/2019   Constipation 10/31/2016   Impaired glucose tolerance 10/31/2016   Stage III chronic kidney disease ([place]) 10/31/2016   Urge urinary incontinence 10/31/2016   Status post right partial knee replacement 10/02/2014   Anxiety and depression 11/06/2011   Hypertension 10/13/2010   Hyperlipidemia 10/13/2010   Osteoarthritis 10/13/2010   UNSPECIFIED IRON DEFICIENCY ANEMIA 04/11/2010   DIARRHEA 04/11/2010    REFERRING DIAG: T01.601 (ICD-10-CM) - Status post revision of total knee, right   THERAPY DIAG:  Acute pain of right knee  Stiffness of right knee, not elsewhere classified  Difficulty in walking, not elsewhere classified  Localized edema  Rationale for Evaluation and Treatment Rehabilitation  PERTINENT HISTORY:           Rt TKA 12/09/21            Rt knee surgery: uni knee arthroplasty injection 09/16/2020   hyperlipidemia, depression, HTN, osteoporosis  PRECAUTIONS:          none  SUBJECTIVE:                                                                                                                                                                                      SUBJECTIVE STATEMENT:  Pt arriving today reporting 4/10 pain in anterior Rt knee.    PAIN:  Are you having pain? Yes: NPRS scale: 4/10 Pain location: Rt anterior knee  Pain description: achy, soreness, tightness Aggravating factors: bending, waking up Relieving factors: muscle relaxers, ice and heat   OBJECTIVE: (objective measures completed at initial evaluation unless otherwise dated)  DIAGNOSTIC FINDINGS: 12/09/21 IMPRESSION: 1. Right total knee arthroplasty revision without acute postoperative complication.images  following TKA    PATIENT SURVEYS:  01/09/22: FOTO intake:  46%   COGNITION: Overall cognitive status: WFL                  SENSATION: WFL:    EDEMA:  01/09/22: Circumferential: Rt:  50 centimeters                           Left: 46 centimeters MUSCLE LENGTH: 01/09/22: Hamstrings: Right 75 deg; Left 84 deg     POSTURE: rounded shoulders and forward head   PALPATION: 01/09/22: TTP: both sides of incision site on Rt knee    LOWER EXTREMITY ROM:    ROM A: active,  P: passive Right 01/09/22 Left 01/09/22 Rt 01/11/22  Hip flexion       Knee flexion A: 76 P: 80 A: 94 A: 76 P: 84  Knee extension A: -10 P: -8 A: -2 A: -8 P: -6   (Blank rows = not tested)   LOWER EXTREMITY MMT:   MMT Right 01/09/22 Left 01/09/22  Hip flexion 4-/5 4-/5  Hip extension      Hip abduction 4/5 4/5  Hip adduction 4/5 4/5  Hip internal rotation      Hip external rotation      Knee flexion 3+/5 5/5  Knee extension 3+/5 5/5   (Blank rows = not tested)     FUNCTIONAL TESTS:  01/09/22: 5 time sit to stand: 17 seconds c UE support   GAIT: Distance walked: 20 feet Assistive device utilized: Single point cane Level of assistance: Modified independence Comments: forward  trunk flexion, antalgic gait pattern, increased knee flexion on Rt     TODAY'S TREATMENT  01/11/22:  TherEx: Recumbent bike: rocking back and forth x 6 minutes Calf stretch on slant board: x 2 holding 30 seconds LAQ: 3# 2 x 10 holding 3 seconds Seated SLR on Rt: 2 x 10 Tail gait position: AAROM using left LE  for overpressure over Rt LE Seated heel slides with towel under Rt foot x 15  Supine bridges: x 5 (discontinued  due to increased pain) Supine SLR: Rt LE x 10 Manual:  PROM Rt knee flexion and extension with overpressure Modalities:  Vasopneumatic 34 deg, low compression, x 10    01/09/22:  Therex:    HEP instruction/performance c cues for techniques, handout provided.  Trial set performed of each for  comprehension and symptom assessment.  See below for exercise list   PATIENT EDUCATION:  Education details: HEP, POC Person educated: Patient Education method: Explanation, Demonstration, Verbal cues, and Handouts Education comprehension: verbalized understanding, returned demonstration, and verbal cues required   HOME EXERCISE PROGRAM: Access Code: [email or web address] URL: [email or web address] Date: 01/09/2022 Prepared by: Kearney Hard   Exercises - Sit to Stand  - 3 x daily - 7 x weekly - 2 sets - 10 reps - Seated Long Arc Quad  - 3 x daily - 7 x weekly - 2 sets - 10 reps - 5 seconds hold - Supine Heel Slide with Strap  - 3 x daily - 7 x weekly - 2 sets - 10 reps - Supine Straight Leg Raises  - 3 x daily - 7 x weekly - 2 sets - 10 reps - Supine Quad Set  - 3 x daily - 7 x weekly - 2 sets - 10 reps - 5 seconds hold     ASSESSMENT:   CLINICAL IMPRESSION: Pt arriving to therapy reporting 4/10 pain in Rt knee. Pt reporting compliance in her HEP. Knee PROM still limited by pain. Pt improved her Rt PROM to 84 degrees this visit. Continue skilled PT to maximize pt's function.    OBJECTIVE IMPAIRMENTS: Abnormal gait, decreased activity tolerance, decreased balance, decreased mobility, difficulty walking, decreased ROM, decreased strength, increased edema, impaired flexibility, and pain.    ACTIVITY LIMITATIONS: lifting, bending, sitting, standing, squatting, sleeping, stairs, and transfers   PARTICIPATION LIMITATIONS: cleaning, driving, and community activity   PERSONAL FACTORS: 3+ comorbidities: see above  are also affecting patient's functional outcome.    REHAB POTENTIAL: Good   CLINICAL DECISION MAKING: Stable/uncomplicated   EVALUATION COMPLEXITY: Low     GOALS: Goals reviewed with patient? Yes   SHORT TERM GOALS: (target date for Short term goals are 3 weeks 02/03/22)    1.  Patient will demonstrate independent use of home exercise program to maintain progress  from in clinic treatments.   Goal status: Ongoing: 01/11/22   2.  Pt will be able to perform 5 time sit to stand in </= 12 seconds c/s UE support.                         Goal status: New   LONG TERM GOALS: (target dates for all long term goals are 12 weeks  04/07/22 )   1. Patient will demonstrate/report pain at worst less than or equal to 2/10 to facilitate minimal limitation in daily activity secondary to pain symptoms.   Goal status: New   2. Patient will demonstrate independent use of home exercise program to facilitate ability to maintain/progress functional gains from skilled physical therapy services.   Goal status: New   3. Patient will demonstrate FOTO outcome > or = 56 % to indicate reduced disability due to condition.   Goal status: New   4.  Patient will demonstrate Rt  LE MMT >/= 4+/5 throughout to faciltiate usual transfers, stairs, squatting at [facility] for daily  life.  Goal status: New   5.  Patient will be able to amb > 800 feet with LRAD with normalized gait pattern on community surfaces.    Goal status: New   6.  Pt will be able to navigate up and down 5 stairs with single hand rail with step over step pattern.    Goal status: New   7. Pt will improve her Rt knee flexion to >/= 100 degrees for improved functional mobility.              Goal status: New         PLAN:   PT FREQUENCY: 2-3x/ week    PT DURATION: 12 weeks   PLANNED INTERVENTIONS: Therapeutic exercises, Therapeutic activity, Neuro Muscular re-education, Balance training, Gait training, Patient/Family education, Joint mobilization, Stair training, DME instructions, Dry Needling, Electrical stimulation, Traction, Cryotherapy, vasopneumatic deviceMoist heat, Taping, Ultrasound, Ionotophoresis '4mg'$ /ml Dexamethasone, and Manual therapy.  All included unless contraindicated   PLAN FOR NEXT SESSION: Review HEP knowledge/results, Nustep,  LE ROM, strengthening, functional mobility, manual therapy,  vasopneumatic          Oretha Caprice, PT, MPT 01/11/2022, 11:30 AM

## 2022-01-11 NOTE — Telephone Encounter (Signed)
Pt is requesting refill on medication (methocarbamol (ROBAXIN) 500 MG tablet)... Pt requesting callback

## 2022-01-13 ENCOUNTER — Ambulatory Visit (INDEPENDENT_AMBULATORY_CARE_PROVIDER_SITE_OTHER): Payer: Medicare Other | Admitting: Physical Therapy

## 2022-01-13 ENCOUNTER — Encounter: Payer: Self-pay | Admitting: Physical Therapy

## 2022-01-13 DIAGNOSIS — R600 Localized edema: Secondary | ICD-10-CM | POA: Diagnosis not present

## 2022-01-13 DIAGNOSIS — M25561 Pain in right knee: Secondary | ICD-10-CM | POA: Diagnosis not present

## 2022-01-13 DIAGNOSIS — R262 Difficulty in walking, not elsewhere classified: Secondary | ICD-10-CM | POA: Diagnosis not present

## 2022-01-13 DIAGNOSIS — M25661 Stiffness of right knee, not elsewhere classified: Secondary | ICD-10-CM
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
## 2022-01-16 ENCOUNTER — Encounter: Payer: Medicare Other | Admitting: Physical Therapy

## 2022-01-16 NOTE — Therapy (Unsigned)
OUTPATIENT PHYSICAL THERAPY TREATMENT NOTE   Patient Name: Kelly [name] MRN: [id number] DOB:Feb 05, 1946, 76 y.o., female Today's Date: 01/18/2022  PCP: Elby Showers MD  REFERRING PROVIDER: Mcarthur Rossetti, MD    END OF SESSION:   PT End of Session - 01/18/22 1517     Visit Number 4    Number of Visits 28    Date for PT Re-Evaluation 04/07/22    Authorization Type Medicare/BCBS    Progress Note Due on Visit 10    PT Start Time 1500    PT Stop Time 1520    PT Time Calculation (min) 20 min    Activity Tolerance Patient tolerated treatment well    Behavior During Therapy [facility] for tasks assessed/performed               Past Medical History:  Diagnosis Date   Arthritis    Cataract    Chronic kidney disease    Depression    Hyperlipidemia    Hypertension    Osteoporosis    Past Surgical History:  Procedure Laterality Date   ABDOMINAL HYSTERECTOMY     BREAST REDUCTION SURGERY     and lift   BREAST REDUCTION SURGERY Bilateral    CARPAL TUNNEL RELEASE  06/2009   right   CATARACT EXTRACTION     CHOLECYSTECTOMY     FACIAL COSMETIC SURGERY  03/2008   KNEE ARTHROSCOPY  2007   left & right   LIPOSUCTION EXTREMITIES     thighs   PARTIAL KNEE ARTHROPLASTY Right 10/02/2014   Procedure: RIGHT KNEE MEDIAL UNICOMPARTMENTAL ARTHROPLASTY;  Surgeon: Mcarthur Rossetti, MD;  Location: [facility];  Service: Orthopedics;  Laterality: Right;   REDUCTION MAMMAPLASTY     bilateral   TOTAL KNEE REVISION  10/12/2011   Procedure: TOTAL KNEE REVISION;  Surgeon: Mcarthur Rossetti, MD;  Location: [facility];  Service: Orthopedics;  Laterality: Left;  Left Total Knee Revision Arthroplasty   TOTAL KNEE REVISION Right 12/09/2021   Procedure: CONVERT RIGHT UNI KNEE ARTHROPLASTY TO TOTAL KNEE ARTHROPLASTY;  Surgeon: Mcarthur Rossetti, MD;  Location: [facility];  Service: Orthopedics;  Laterality: Right;   TRIGGER FINGER RELEASE  06/2009   right 4th finger   Patient Active  Problem List   Diagnosis Date Noted   Status post revision of total knee, right 12/09/2021   Arthritis of right knee 12/08/2021   Chronic left-sided low back pain with left-sided sciatica 12/17/2019   Diabetes mellitus type 2 in obese ([place]) 12/13/2019   Chronic rhinitis 03/03/2019   Constipation 10/31/2016   Impaired glucose tolerance 10/31/2016   Stage III chronic kidney disease ([place]) 10/31/2016   Urge urinary incontinence 10/31/2016   Status post right partial knee replacement 10/02/2014   Anxiety and depression 11/06/2011   Hypertension 10/13/2010   Hyperlipidemia 10/13/2010   Osteoarthritis 10/13/2010   UNSPECIFIED IRON DEFICIENCY ANEMIA 04/11/2010   DIARRHEA 04/11/2010    REFERRING DIAG: O03.212 (ICD-10-CM) - Status post revision of total knee, right   THERAPY DIAG:  Acute pain of right knee  Stiffness of right knee, not elsewhere classified  Difficulty in walking, not elsewhere classified  Localized edema  Rationale for Evaluation and Treatment Rehabilitation  PERTINENT HISTORY:  Rt TKA 12/09/21            Rt knee surgery: uni knee arthroplasty injection 09/16/2020   hyperlipidemia, depression, HTN, osteoporosis  PRECAUTIONS:  none  SUBJECTIVE:  SUBJECTIVE STATEMENT:  Pt states that she has had a few surges of pain shoot down her leg after doing her exercises.   PAIN:  Are you having pain? Yes: NPRS scale: 4-5/10 Pain location: R anterior knee  Pain description: sore, constant ache Aggravating factors: bending, pushing to hard on the bike  Relieving factors: muscle relaxant, ice/heat    OBJECTIVE: (objective measures completed at initial evaluation unless otherwise dated)  DIAGNOSTIC FINDINGS: 12/09/21 IMPRESSION: 1. Right total knee arthroplasty revision without  acute postoperative complication.images following TKA    PATIENT SURVEYS:  01/09/22: FOTO intake:  46%   COGNITION: Overall cognitive status: WFL                  SENSATION: WFL:    EDEMA:  01/09/22: Circumferential: Rt:  50 centimeters                           Left: 46 centimeters MUSCLE LENGTH: 01/09/22: Hamstrings: Right 75 deg; Left 84 deg     POSTURE: rounded shoulders and forward head   PALPATION: 01/09/22: TTP: both sides of incision site on Rt knee    LOWER EXTREMITY ROM:    ROM A: active,  P: passive Right 01/09/22 Left 01/09/22 Rt 01/11/22  Hip flexion       Knee flexion A: 76 P: 80 A: 94 A: 76 P: 84  Knee extension A: -10 P: -8 A: -2 A: -8 P: -6   (Blank rows = not tested)   LOWER EXTREMITY MMT:   MMT Right 01/09/22 Left 01/09/22  Hip flexion 4-/5 4-/5  Hip extension      Hip abduction 4/5 4/5  Hip adduction 4/5 4/5  Hip internal rotation      Hip external rotation      Knee flexion 3+/5 5/5  Knee extension 3+/5 5/5   (Blank rows = not tested)     FUNCTIONAL TESTS:  01/09/22: 5 time sit to stand: 17 seconds c UE support   GAIT: Distance walked: 20 feet Assistive device utilized: Single point cane Level of assistance: Modified independence Comments: forward  trunk flexion, antalgic gait pattern, increased knee flexion on Rt     TODAY'S TREATMENT  01/18/22  TherEx SciFit bike seat 10 x6 minutes partial rotations progressing to full rotations Tail gait position: AAROM using left LE for overpressure over Rt LE, use of theragun during.  LAQ: 3# 2 x 10 holding 3 seconds Leg press DL: 62lbs 2x10, SL: 25lbs  Manual OP into flexion reaching 80 degrees using contract/ relax.   Modalities:  Vasopneumatic 34 deg, low compression, x 10    TherEx  SciFit bike seat 10 x6 minutes partial rotations progressing to full rotations Knee flexion OP at edge of mat table after manual- only able to get to 74 degrees AAROM       Manual  Retrograde massage LE elevated  Tennis ball STM to R quad with LE elevated    01/11/22:  TherEx: Recumbent bike: rocking back and forth x 6 minutes Calf stretch on slant board: x 2 holding 30 seconds LAQ: 3# 2 x 10 holding 3 seconds Seated SLR on Rt: 2 x 10 Tail gait position: AAROM using left LE for overpressure over Rt LE Seated heel slides with towel under Rt foot x 15  Supine bridges: x 5 (discontinued  due to increased pain) Supine SLR: Rt LE x 10 Manual:  PROM Rt knee flexion and extension with  overpressure Modalities:  Vasopneumatic 34 deg, low compression, x 10    01/09/22:  Therex:    HEP instruction/performance c cues for techniques, handout provided.  Trial set performed of each for comprehension and symptom assessment.  See below for exercise list   PATIENT EDUCATION:  Education details: HEP, POC Person educated: Patient Education method: Explanation, Demonstration, Verbal cues, and Handouts Education comprehension: verbalized understanding, returned demonstration, and verbal cues required   HOME EXERCISE PROGRAM: Access Code: [email or web address] URL: [email or web address] Date: 01/09/2022 Prepared by: Kearney Hard   Exercises - Sit to Stand  - 3 x daily - 7 x weekly - 2 sets - 10 reps - Seated Long Arc Quad  - 3 x daily - 7 x weekly - 2 sets - 10 reps - 5 seconds hold - Supine Heel Slide with Strap  - 3 x daily - 7 x weekly - 2 sets - 10 reps - Supine Straight Leg Raises  - 3 x daily - 7 x weekly - 2 sets - 10 reps - Supine Quad Set  - 3 x daily - 7 x weekly - 2 sets - 10 reps - 5 seconds hold     ASSESSMENT:   CLINICAL IMPRESSION:  Patient arrives with continued gait deficits and SPC.  Session with continued focus on ROM with pt reaching 80 degrees. Challenged her with leg press today with good response. Pt continues to be very motivated. Will continue to progress as able and tolerated.    OBJECTIVE IMPAIRMENTS: Abnormal gait,  decreased activity tolerance, decreased balance, decreased mobility, difficulty walking, decreased ROM, decreased strength, increased edema, impaired flexibility, and pain.    ACTIVITY LIMITATIONS: lifting, bending, sitting, standing, squatting, sleeping, stairs, and transfers   PARTICIPATION LIMITATIONS: cleaning, driving, and community activity   PERSONAL FACTORS: 3+ comorbidities: see above  are also affecting patient's functional outcome.    REHAB POTENTIAL: Good   CLINICAL DECISION MAKING: Stable/uncomplicated   EVALUATION COMPLEXITY: Low     GOALS: Goals reviewed with patient? Yes   SHORT TERM GOALS: (target date for Short term goals are 3 weeks 02/03/22)    1.  Patient will demonstrate independent use of home exercise program to maintain progress from in clinic treatments.   Goal status: Ongoing: 01/11/22   2.  Pt will be able to perform 5 time sit to stand in </= 12 seconds c/s UE support.                         Goal status: New   LONG TERM GOALS: (target dates for all long term goals are 12 weeks  04/07/22 )   1. Patient will demonstrate/report pain at worst less than or equal to 2/10 to facilitate minimal limitation in daily activity secondary to pain symptoms.   Goal status: New   2. Patient will demonstrate independent use of home exercise program to facilitate ability to maintain/progress functional gains from skilled physical therapy services.   Goal status: New   3. Patient will demonstrate FOTO outcome > or = 56 % to indicate reduced disability due to condition.   Goal status: New   4.  Patient will demonstrate Rt  LE MMT >/= 4+/5 throughout to faciltiate usual transfers, stairs, squatting at [facility] for daily life.  Goal status: New   5.  Patient will be able to amb > 800 feet with LRAD with normalized gait pattern on community surfaces.    Goal status: New   6.  Pt will be able to navigate up and down 5 stairs with single hand rail with step over step  pattern.    Goal status: New   7. Pt will improve her Rt knee flexion to >/= 100 degrees for improved functional mobility.              Goal status: New    PLAN:   PT FREQUENCY: 2-3x/ week    PT DURATION: 12 weeks   PLANNED INTERVENTIONS: Therapeutic exercises, Therapeutic activity, Neuro Muscular re-education, Balance training, Gait training, Patient/Family education, Joint mobilization, Stair training, DME instructions, Dry Needling, Electrical stimulation, Traction, Cryotherapy, vasopneumatic deviceMoist heat, Taping, Ultrasound, Ionotophoresis '4mg'$ /ml Dexamethasone, and Manual therapy.  All included unless contraindicated   PLAN FOR NEXT SESSION: Review HEP knowledge/results, Nustep,  LE ROM, strengthening, functional mobility, manual therapy, vasopneumatic     Rudi Heap PT, DPT 01/18/22  3:21 PM

## 2022-01-18 ENCOUNTER — Ambulatory Visit (INDEPENDENT_AMBULATORY_CARE_PROVIDER_SITE_OTHER): Payer: Medicare Other | Admitting: Physical Therapy

## 2022-01-18 DIAGNOSIS — R600 Localized edema: Secondary | ICD-10-CM | POA: Diagnosis not present

## 2022-01-18 DIAGNOSIS — M25661 Stiffness of right knee, not elsewhere classified: Secondary | ICD-10-CM

## 2022-01-18 DIAGNOSIS — M25561 Pain in right knee: Secondary | ICD-10-CM | POA: Diagnosis not present

## 2022-01-18 DIAGNOSIS — R262 Difficulty in walking, not elsewhere classified: Secondary | ICD-10-CM

## 2022-01-20 ENCOUNTER — Encounter: Payer: Self-pay | Admitting: Physical Therapy

## 2022-01-20 ENCOUNTER — Ambulatory Visit (INDEPENDENT_AMBULATORY_CARE_PROVIDER_SITE_OTHER): Payer: Medicare Other | Admitting: Physical Therapy

## 2022-01-20 DIAGNOSIS — R600 Localized edema: Secondary | ICD-10-CM | POA: Diagnosis not present

## 2022-01-20 DIAGNOSIS — R293 Abnormal posture: Secondary | ICD-10-CM | POA: Diagnosis not present

## 2022-01-20 DIAGNOSIS — R262 Difficulty in walking, not elsewhere classified: Secondary | ICD-10-CM

## 2022-01-20 DIAGNOSIS — M25661 Stiffness of right knee, not elsewhere classified: Secondary | ICD-10-CM

## 2022-01-20 DIAGNOSIS — M5441 Lumbago with sciatica, right side: Secondary | ICD-10-CM | POA: Diagnosis not present

## 2022-01-20 DIAGNOSIS — G8929 Other chronic pain: Secondary | ICD-10-CM

## 2022-01-20 DIAGNOSIS — M25561 Pain in right knee: Secondary | ICD-10-CM | POA: Diagnosis not present

## 2022-01-20 NOTE — Therapy (Deleted)
OUTPATIENT PHYSICAL THERAPY TREATMENT NOTE   Patient Name: Kelly [name] MRN: [id number] DOB:May 09, 1945, 76 y.o., female Today's Date: 01/20/2022  PCP: Elby Showers MD  REFERRING PROVIDER: Mcarthur Rossetti, MD    END OF SESSION:       Past Medical History:  Diagnosis Date   Arthritis    Cataract    Chronic kidney disease    Depression    Hyperlipidemia    Hypertension    Osteoporosis    Past Surgical History:  Procedure Laterality Date   ABDOMINAL HYSTERECTOMY     BREAST REDUCTION SURGERY     and lift   BREAST REDUCTION SURGERY Bilateral    CARPAL TUNNEL RELEASE  06/2009   right   CATARACT EXTRACTION     CHOLECYSTECTOMY     FACIAL COSMETIC SURGERY  03/2008   KNEE ARTHROSCOPY  2007   left & right   LIPOSUCTION EXTREMITIES     thighs   PARTIAL KNEE ARTHROPLASTY Right 10/02/2014   Procedure: RIGHT KNEE MEDIAL UNICOMPARTMENTAL ARTHROPLASTY;  Surgeon: Mcarthur Rossetti, MD;  Location: [facility];  Service: Orthopedics;  Laterality: Right;   REDUCTION MAMMAPLASTY     bilateral   TOTAL KNEE REVISION  10/12/2011   Procedure: TOTAL KNEE REVISION;  Surgeon: Mcarthur Rossetti, MD;  Location: [facility];  Service: Orthopedics;  Laterality: Left;  Left Total Knee Revision Arthroplasty   TOTAL KNEE REVISION Right 12/09/2021   Procedure: CONVERT RIGHT UNI KNEE ARTHROPLASTY TO TOTAL KNEE ARTHROPLASTY;  Surgeon: Mcarthur Rossetti, MD;  Location: [facility];  Service: Orthopedics;  Laterality: Right;   TRIGGER FINGER RELEASE  06/2009   right 4th finger   Patient Active Problem List   Diagnosis Date Noted   Status post revision of total knee, right 12/09/2021   Arthritis of right knee 12/08/2021   Chronic left-sided low back pain with left-sided sciatica 12/17/2019   Diabetes mellitus type 2 in obese ([place]) 12/13/2019   Chronic rhinitis 03/03/2019   Constipation 10/31/2016   Impaired glucose tolerance 10/31/2016   Stage III chronic kidney disease ([place])  10/31/2016   Urge urinary incontinence 10/31/2016   Status post right partial knee replacement 10/02/2014   Anxiety and depression 11/06/2011   Hypertension 10/13/2010   Hyperlipidemia 10/13/2010   Osteoarthritis 10/13/2010   UNSPECIFIED IRON DEFICIENCY ANEMIA 04/11/2010   DIARRHEA 04/11/2010    REFERRING DIAG: H21.975 (ICD-10-CM) - Status post revision of total knee, right   THERAPY DIAG:  No diagnosis found.  Rationale for Evaluation and Treatment Rehabilitation  PERTINENT HISTORY:  Rt TKA 12/09/21            Rt knee surgery: uni knee arthroplasty injection 09/16/2020   hyperlipidemia, depression, HTN, osteoporosis  PRECAUTIONS:  none  SUBJECTIVE:  SUBJECTIVE STATEMENT:  Pt states that she has had a few surges of pain shoot down her leg after doing her exercises.   PAIN:  Are you having pain? Yes: NPRS scale: 4-5/10 Pain location: R anterior knee  Pain description: sore, constant ache Aggravating factors: bending, pushing to hard on the bike  Relieving factors: muscle relaxant, ice/heat    OBJECTIVE: (objective measures completed at initial evaluation unless otherwise dated)  DIAGNOSTIC FINDINGS: 12/09/21 IMPRESSION: 1. Right total knee arthroplasty revision without acute postoperative complication.images following TKA    PATIENT SURVEYS:  01/09/22: FOTO intake:  46%   COGNITION: Overall cognitive status: WFL                  SENSATION: WFL:    EDEMA:  01/09/22: Circumferential: Rt:  50 centimeters                           Left: 46 centimeters MUSCLE LENGTH: 01/09/22: Hamstrings: Right 75 deg; Left 84 deg     POSTURE: rounded shoulders and forward head   PALPATION: 01/09/22: TTP: both sides of incision site on Rt knee    LOWER EXTREMITY ROM:    ROM A: active,  P: passive  Right 01/09/22 Left 01/09/22 Rt 01/11/22  Hip flexion       Knee flexion A: 76 P: 80 A: 94 A: 76 P: 84  Knee extension A: -10 P: -8 A: -2 A: -8 P: -6   (Blank rows = not tested)   LOWER EXTREMITY MMT:   MMT Right 01/09/22 Left 01/09/22  Hip flexion 4-/5 4-/5  Hip extension      Hip abduction 4/5 4/5  Hip adduction 4/5 4/5  Hip internal rotation      Hip external rotation      Knee flexion 3+/5 5/5  Knee extension 3+/5 5/5   (Blank rows = not tested)     FUNCTIONAL TESTS:  01/09/22: 5 time sit to stand: 17 seconds c UE support   GAIT: Distance walked: 20 feet Assistive device utilized: Single point cane Level of assistance: Modified independence Comments: forward  trunk flexion, antalgic gait pattern, increased knee flexion on Rt     TODAY'S TREATMENT  01/18/22  TherEx SciFit bike seat 10 x6 minutes partial rotations progressing to full rotations Tail gait position: AAROM using left LE for overpressure over Rt LE, use of theragun during.  LAQ: 3# 2 x 10 holding 3 seconds Leg press DL: 62lbs 2x10, SL: 25lbs  Manual OP into flexion reaching 80 degrees using contract/ relax.   Modalities:  Vasopneumatic 34 deg, low compression, x 10    TherEx  SciFit bike seat 10 x6 minutes partial rotations progressing to full rotations Knee flexion OP at edge of mat table after manual- only able to get to 74 degrees AAROM      Manual  Retrograde massage LE elevated  Tennis ball STM to R quad with LE elevated    01/11/22:  TherEx: Recumbent bike: rocking back and forth x 6 minutes Calf stretch on slant board: x 2 holding 30 seconds LAQ: 3# 2 x 10 holding 3 seconds Seated SLR on Rt: 2 x 10 Tail gait position: AAROM using left LE for overpressure over Rt LE Seated heel slides with towel under Rt foot x 15  Supine bridges: x 5 (discontinued  due to increased pain) Supine SLR: Rt LE x 10 Manual:  PROM Rt knee flexion and extension with overpressure  Modalities:   Vasopneumatic 34 deg, low compression, x 10    01/09/22:  Therex:    HEP instruction/performance c cues for techniques, handout provided.  Trial set performed of each for comprehension and symptom assessment.  See below for exercise list   PATIENT EDUCATION:  Education details: HEP, POC Person educated: Patient Education method: Explanation, Demonstration, Verbal cues, and Handouts Education comprehension: verbalized understanding, returned demonstration, and verbal cues required   HOME EXERCISE PROGRAM: Access Code: [email or web address] URL: [email or web address] Date: 01/09/2022 Prepared by: Kearney Hard   Exercises - Sit to Stand  - 3 x daily - 7 x weekly - 2 sets - 10 reps - Seated Long Arc Quad  - 3 x daily - 7 x weekly - 2 sets - 10 reps - 5 seconds hold - Supine Heel Slide with Strap  - 3 x daily - 7 x weekly - 2 sets - 10 reps - Supine Straight Leg Raises  - 3 x daily - 7 x weekly - 2 sets - 10 reps - Supine Quad Set  - 3 x daily - 7 x weekly - 2 sets - 10 reps - 5 seconds hold     ASSESSMENT:   CLINICAL IMPRESSION:  Patient arrives with continued gait deficits and SPC.  Session with continued focus on ROM with pt reaching 80 degrees. Challenged her with leg press today with good response. Pt continues to be very motivated. Will continue to progress as able and tolerated.    OBJECTIVE IMPAIRMENTS: Abnormal gait, decreased activity tolerance, decreased balance, decreased mobility, difficulty walking, decreased ROM, decreased strength, increased edema, impaired flexibility, and pain.    ACTIVITY LIMITATIONS: lifting, bending, sitting, standing, squatting, sleeping, stairs, and transfers   PARTICIPATION LIMITATIONS: cleaning, driving, and community activity   PERSONAL FACTORS: 3+ comorbidities: see above  are also affecting patient's functional outcome.    REHAB POTENTIAL: Good   CLINICAL DECISION MAKING: Stable/uncomplicated   EVALUATION COMPLEXITY: Low      GOALS: Goals reviewed with patient? Yes   SHORT TERM GOALS: (target date for Short term goals are 3 weeks 02/03/22)    1.  Patient will demonstrate independent use of home exercise program to maintain progress from in clinic treatments.   Goal status: Ongoing: 01/11/22   2.  Pt will be able to perform 5 time sit to stand in </= 12 seconds c/s UE support.                         Goal status: New   LONG TERM GOALS: (target dates for all long term goals are 12 weeks  04/07/22 )   1. Patient will demonstrate/report pain at worst less than or equal to 2/10 to facilitate minimal limitation in daily activity secondary to pain symptoms.   Goal status: New   2. Patient will demonstrate independent use of home exercise program to facilitate ability to maintain/progress functional gains from skilled physical therapy services.   Goal status: New   3. Patient will demonstrate FOTO outcome > or = 56 % to indicate reduced disability due to condition.   Goal status: New   4.  Patient will demonstrate Rt  LE MMT >/= 4+/5 throughout to faciltiate usual transfers, stairs, squatting at [facility] for daily life.  Goal status: New   5.  Patient will be able to amb > 800 feet with LRAD with normalized gait pattern on community surfaces.    Goal status: New   6.  Pt will be able to navigate up and down 5 stairs with single hand rail with step over step pattern.    Goal status: New   7. Pt will improve her Rt knee flexion to >/= 100 degrees for improved functional mobility.              Goal status: New    PLAN:   PT FREQUENCY: 2-3x/ week    PT DURATION: 12 weeks   PLANNED INTERVENTIONS: Therapeutic exercises, Therapeutic activity, Neuro Muscular re-education, Balance training, Gait training, Patient/Family education, Joint mobilization, Stair training, DME instructions, Dry Needling, Electrical stimulation, Traction, Cryotherapy, vasopneumatic deviceMoist heat, Taping, Ultrasound, Ionotophoresis  '4mg'$ /ml Dexamethasone, and Manual therapy.  All included unless contraindicated   PLAN FOR NEXT SESSION: Review HEP knowledge/results, Nustep,  LE ROM, strengthening, functional mobility, manual therapy, vasopneumatic     Rudi Heap PT, DPT 01/20/22  8:57 AM

## 2022-01-20 NOTE — Therapy (Signed)
OUTPATIENT PHYSICAL THERAPY TREATMENT NOTE   Patient Name: Kelly [name] MRN: [id number] DOB:07-25-1945, 76 y.o., female Today's Date: 01/20/2022  PCP: Elby Showers MD  REFERRING PROVIDER: Mcarthur Rossetti, MD    END OF SESSION:   PT End of Session - 01/20/22 1019     Visit Number 5    Number of Visits 28    Date for PT Re-Evaluation 04/07/22    Authorization Type Medicare/BCBS    Progress Note Due on Visit 10    PT Start Time 4098    PT Stop Time 1053    PT Time Calculation (min) 38 min    Activity Tolerance Patient tolerated treatment well    Behavior During Therapy WFL for tasks assessed/performed                Past Medical History:  Diagnosis Date   Arthritis    Cataract    Chronic kidney disease    Depression    Hyperlipidemia    Hypertension    Osteoporosis    Past Surgical History:  Procedure Laterality Date   ABDOMINAL HYSTERECTOMY     BREAST REDUCTION SURGERY     and lift   BREAST REDUCTION SURGERY Bilateral    CARPAL TUNNEL RELEASE  06/2009   right   CATARACT EXTRACTION     CHOLECYSTECTOMY     FACIAL COSMETIC SURGERY  03/2008   KNEE ARTHROSCOPY  2007   left & right   LIPOSUCTION EXTREMITIES     thighs   PARTIAL KNEE ARTHROPLASTY Right 10/02/2014   Procedure: RIGHT KNEE MEDIAL UNICOMPARTMENTAL ARTHROPLASTY;  Surgeon: Mcarthur Rossetti, MD;  Location: [facility];  Service: Orthopedics;  Laterality: Right;   REDUCTION MAMMAPLASTY     bilateral   TOTAL KNEE REVISION  10/12/2011   Procedure: TOTAL KNEE REVISION;  Surgeon: Mcarthur Rossetti, MD;  Location: [facility];  Service: Orthopedics;  Laterality: Left;  Left Total Knee Revision Arthroplasty   TOTAL KNEE REVISION Right 12/09/2021   Procedure: CONVERT RIGHT UNI KNEE ARTHROPLASTY TO TOTAL KNEE ARTHROPLASTY;  Surgeon: Mcarthur Rossetti, MD;  Location: [facility];  Service: Orthopedics;  Laterality: Right;   TRIGGER FINGER RELEASE  06/2009   right 4th finger   Patient  Active Problem List   Diagnosis Date Noted   Status post revision of total knee, right 12/09/2021   Arthritis of right knee 12/08/2021   Chronic left-sided low back pain with left-sided sciatica 12/17/2019   Diabetes mellitus type 2 in obese ([place]) 12/13/2019   Chronic rhinitis 03/03/2019   Constipation 10/31/2016   Impaired glucose tolerance 10/31/2016   Stage III chronic kidney disease ([place]) 10/31/2016   Urge urinary incontinence 10/31/2016   Status post right partial knee replacement 10/02/2014   Anxiety and depression 11/06/2011   Hypertension 10/13/2010   Hyperlipidemia 10/13/2010   Osteoarthritis 10/13/2010   UNSPECIFIED IRON DEFICIENCY ANEMIA 04/11/2010   DIARRHEA 04/11/2010    REFERRING DIAG: J19.147 (ICD-10-CM) - Status post revision of total knee, right   THERAPY DIAG:  Acute pain of right knee  Stiffness of right knee, not elsewhere classified  Difficulty in walking, not elsewhere classified  Localized edema  Chronic bilateral low back pain with right-sided sciatica  Abnormal posture  Rationale for Evaluation and Treatment Rehabilitation  PERTINENT HISTORY:  Rt TKA 12/09/21            Rt knee surgery: uni knee arthroplasty injection 09/16/2020   hyperlipidemia, depression, HTN, osteoporosis  PRECAUTIONS:  none  SUBJECTIVE:  SUBJECTIVE STATEMENT:  "I feel great."     PAIN:  Are you having pain? Yes: NPRS scale: 2/10 Pain location: R anterior knee  Pain description: sore, constant ache Aggravating factors: bending, pushing to hard on the bike  Relieving factors: muscle relaxant, ice/heat    OBJECTIVE: (objective measures completed at initial evaluation unless otherwise dated)  DIAGNOSTIC FINDINGS: 12/09/21 IMPRESSION: 1. Right total knee arthroplasty revision without  acute postoperative complication.images following TKA    PATIENT SURVEYS:  01/09/22: FOTO intake:  46%   COGNITION: Overall cognitive status: WFL                  SENSATION: WFL:    EDEMA:  01/09/22: Circumferential: Rt:  50 centimeters                           Left: 46 centimeters MUSCLE LENGTH: 01/09/22: Hamstrings: Right 75 deg; Left 84 deg     POSTURE: rounded shoulders and forward head   PALPATION: 01/09/22: TTP: both sides of incision site on Rt knee    LOWER EXTREMITY ROM:    ROM A: active,  P: passive Right 01/09/22 Left 01/09/22 Rt 01/11/22 Right 01/20/22  Hip flexion        Knee flexion A: 76 P: 80 A: 94 A: 76 P: 84 A: 80 P: 86  Knee extension A: -10 P: -8 A: -2 A: -8 P: -6 A (LAQ): -5   (Blank rows = not tested)   LOWER EXTREMITY MMT:   MMT Right 01/09/22 Left 01/09/22  Hip flexion 4-/5 4-/5  Hip extension      Hip abduction 4/5 4/5  Hip adduction 4/5 4/5  Hip internal rotation      Hip external rotation      Knee flexion 3+/5 5/5  Knee extension 3+/5 5/5   (Blank rows = not tested)     FUNCTIONAL TESTS:  01/09/22: 5 time sit to stand: 17 seconds c UE support   GAIT: Distance walked: 20 feet Assistive device utilized: Single point cane Level of assistance: Modified independence Comments: forward  trunk flexion, antalgic gait pattern, increased knee flexion on Rt     TODAY'S TREATMENT  01/20/22 TherEx SciFit bike seat 9 x 8 minutes full rotations (using arms to help push into flexion) Seated Rt SLR 2x10 Seated LAQ 4# on Rt 2x10, holding 3 sec Seated AA knee flexion 10 x 10 sec hold; LLE providing overpressure Supine AA heel slides 2x10 reps on Rt ROM measurements -see above   01/18/22  TherEx SciFit bike seat 10 x6 minutes partial rotations progressing to full rotations Tail gait position: AAROM using left LE for overpressure over Rt LE, use of theragun during.  LAQ: 3# 2 x 10 holding 3 seconds Leg press DL: 62lbs 2x10,  SL: 25lbs  Manual OP into flexion reaching 80 degrees using contract/ relax.   Modalities:  Vasopneumatic 34 deg, low compression, x 10    TherEx  SciFit bike seat 10 x6 minutes partial rotations progressing to full rotations Knee flexion OP at edge of mat table after manual- only able to get to 74 degrees AAROM      Manual  Retrograde massage LE elevated  Tennis ball STM to R quad with LE elevated    01/11/22:  TherEx: Recumbent bike: rocking back and forth x 6 minutes Calf stretch on slant board: x 2 holding 30 seconds LAQ: 3# 2 x 10 holding 3 seconds Seated SLR  on Rt: 2 x 10 Tail gait position: AAROM using left LE for overpressure over Rt LE Seated heel slides with towel under Rt foot x 15  Supine bridges: x 5 (discontinued  due to increased pain) Supine SLR: Rt LE x 10 Manual:  PROM Rt knee flexion and extension with overpressure Modalities:  Vasopneumatic 34 deg, low compression, x 10    01/09/22:  Therex:    HEP instruction/performance c cues for techniques, handout provided.  Trial set performed of each for comprehension and symptom assessment.  See below for exercise list   PATIENT EDUCATION:  Education details: HEP, POC Person educated: Patient Education method: Explanation, Demonstration, Verbal cues, and Handouts Education comprehension: verbalized understanding, returned demonstration, and verbal cues required   HOME EXERCISE PROGRAM: Access Code: [email or web address] URL: [email or web address] Date: 01/09/2022 Prepared by: Kearney Hard   Exercises - Sit to Stand  - 3 x daily - 7 x weekly - 2 sets - 10 reps - Seated Long Arc Quad  - 3 x daily - 7 x weekly - 2 sets - 10 reps - 5 seconds hold - Supine Heel Slide with Strap  - 3 x daily - 7 x weekly - 2 sets - 10 reps - Supine Straight Leg Raises  - 3 x daily - 7 x weekly - 2 sets - 10 reps - Supine Quad Set  - 3 x daily - 7 x weekly - 2 sets - 10 reps - 5 seconds hold     ASSESSMENT:    CLINICAL IMPRESSION: Pt tolerated session well today with continued focus on quad strengthening and maximizing ROM.  Flexion still limited but slow progress has been made.  Will continue to benefit from PT to maximize function.   OBJECTIVE IMPAIRMENTS: Abnormal gait, decreased activity tolerance, decreased balance, decreased mobility, difficulty walking, decreased ROM, decreased strength, increased edema, impaired flexibility, and pain.    ACTIVITY LIMITATIONS: lifting, bending, sitting, standing, squatting, sleeping, stairs, and transfers   PARTICIPATION LIMITATIONS: cleaning, driving, and community activity   PERSONAL FACTORS: 3+ comorbidities: see above  are also affecting patient's functional outcome.    REHAB POTENTIAL: Good   CLINICAL DECISION MAKING: Stable/uncomplicated   EVALUATION COMPLEXITY: Low     GOALS: Goals reviewed with patient? Yes   SHORT TERM GOALS: (target date for Short term goals are 3 weeks 02/03/22)    1.  Patient will demonstrate independent use of home exercise program to maintain progress from in clinic treatments.   Goal status: Ongoing: 01/11/22   2.  Pt will be able to perform 5 time sit to stand in </= 12 seconds c/s UE support.                         Goal status: New   LONG TERM GOALS: (target dates for all long term goals are 12 weeks  04/07/22 )   1. Patient will demonstrate/report pain at worst less than or equal to 2/10 to facilitate minimal limitation in daily activity secondary to pain symptoms.   Goal status: New   2. Patient will demonstrate independent use of home exercise program to facilitate ability to maintain/progress functional gains from skilled physical therapy services.   Goal status: New   3. Patient will demonstrate FOTO outcome > or = 56 % to indicate reduced disability due to condition.   Goal status: New   4.  Patient will demonstrate Rt  LE MMT >/= 4+/5 throughout to faciltiate usual transfers, stairs,  squatting  at [facility] for daily life.  Goal status: New   5.  Patient will be able to amb > 800 feet with LRAD with normalized gait pattern on community surfaces.    Goal status: New   6.  Pt will be able to navigate up and down 5 stairs with single hand rail with step over step pattern.    Goal status: New   7. Pt will improve her Rt knee flexion to >/= 100 degrees for improved functional mobility.              Goal status: New    PLAN:   PT FREQUENCY: 2-3x/ week    PT DURATION: 12 weeks   PLANNED INTERVENTIONS: Therapeutic exercises, Therapeutic activity, Neuro Muscular re-education, Balance training, Gait training, Patient/Family education, Joint mobilization, Stair training, DME instructions, Dry Needling, Electrical stimulation, Traction, Cryotherapy, vasopneumatic deviceMoist heat, Taping, Ultrasound, Ionotophoresis '4mg'$ /ml Dexamethasone, and Manual therapy.  All included unless contraindicated   PLAN FOR NEXT SESSION: Needs MD note,  SciFit bike,  LE ROM, strengthening, functional mobility, manual therapy, vasopneumatic     Laureen Abrahams, PT, DPT 01/20/22 10:57 AM

## 2022-01-23 ENCOUNTER — Encounter: Payer: Medicare Other | Admitting: Physical Therapy

## 2022-01-23 ENCOUNTER — Telehealth: Payer: Self-pay | Admitting: Orthopaedic Surgery

## 2022-01-23 NOTE — Telephone Encounter (Signed)
Called and worked in with [place] for next week since Dr. Ninfa Linden is out of the office

## 2022-01-23 NOTE — Telephone Encounter (Signed)
Patient called. She has COVID. Would like an appointment next week with Dr. Ninfa Linden. A Monday or Wednesday.

## 2022-01-25 ENCOUNTER — Encounter: Payer: Medicare Other | Admitting: Orthopaedic Surgery

## 2022-01-25 ENCOUNTER — Encounter: Payer: Medicare Other | Admitting: Physical Therapy

## 2022-01-27 ENCOUNTER — Encounter: Payer: Medicare Other | Admitting: Rehabilitative and Restorative Service Providers"

## 2022-01-30 ENCOUNTER — Ambulatory Visit (INDEPENDENT_AMBULATORY_CARE_PROVIDER_SITE_OTHER): Payer: Medicare Other | Admitting: Physical Therapy

## 2022-01-30 ENCOUNTER — Encounter: Payer: Self-pay | Admitting: Physical Therapy

## 2022-01-30 ENCOUNTER — Encounter: Payer: Self-pay | Admitting: Physician Assistant

## 2022-01-30 ENCOUNTER — Ambulatory Visit (INDEPENDENT_AMBULATORY_CARE_PROVIDER_SITE_OTHER): Payer: Medicare Other | Admitting: Physician Assistant

## 2022-01-30 DIAGNOSIS — M25561 Pain in right knee: Secondary | ICD-10-CM

## 2022-01-30 DIAGNOSIS — Z96651 Presence of right artificial knee joint: Secondary | ICD-10-CM

## 2022-01-30 DIAGNOSIS — R262 Difficulty in walking, not elsewhere classified: Secondary | ICD-10-CM

## 2022-01-30 DIAGNOSIS — M25661 Stiffness of right knee, not elsewhere classified: Secondary | ICD-10-CM

## 2022-01-30 DIAGNOSIS — M24661 Ankylosis, right knee: Secondary | ICD-10-CM

## 2022-01-30 DIAGNOSIS — R600 Localized edema: Secondary | ICD-10-CM

## 2022-01-30 NOTE — Therapy (Signed)
[facility] [address] [place], [place], [address] Phone: [phone]   Fax:  [phone]  Patient Details  Name: Kelly [name] MRN: [id number] Date of Birth: 02-27-1945 Referring Provider:  Mcarthur Rossetti*  Encounter Date: 01/30/2022   Pt arriving to therapy 20 minutes late following visit with her orthopedist in which she has a planned manipulation on 02/09/22. Pt stating she doesn't feel 100% due to having Covid last week and wants to hold therapy this week. Pt was encouraged to continue her HEP. We have also scheduled pt for therapy the day after the manipulation and for the following week.   01/30/22 3:04 PM    Oretha Caprice, PT, MPT 01/30/2022, 3:02 PM  [facility] Physical [facility] [address] [place], [place], [address] Phone: [phone]   Fax:  [phone]

## 2022-01-31 DIAGNOSIS — R350 Frequency of micturition: Secondary | ICD-10-CM | POA: Diagnosis not present

## 2022-01-31 NOTE — Progress Notes (Signed)
HPI: Kelly [name] comes in today status post right total knee arthroplasty 12/09/2021.  She underwent conversion from a Uni knee arthroplasty to a right total knee arthroplasty.  She unfortunately developed COVID postop and had to delay some of her therapy.  She is quite frustrated with the fact that she is not advancing in regards to range of motion.  She is still working with therapy and doing her home exercise program but states she just cannot get past the 86 degrees of flexion.   Review of systems see HPI otherwise negative  Physical exam: General well-developed well-nourished female in no acute distress ambulates with assistive device.  Positive antalgic gait. Right knee surgical incisions well-healed.  Full extension no instability valgus varus stressing.  Flexion passively to approximately 85 degrees.  Right calf supple nontender.  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  Will work on setting this up in the near future.  She will continue with therapy in the interim.  Discussed risk benefits of surgery.  Discussed the fact that she will need therapy the day of surgery to work on her range of motion.  She will follow-up postop

## 2022-02-01 ENCOUNTER — Encounter: Payer: Medicare Other | Admitting: Physical Therapy

## 2022-02-03 ENCOUNTER — Encounter: Payer: Medicare Other | Admitting: Rehabilitative and Restorative Service Providers"

## 2022-02-07 ENCOUNTER — Other Ambulatory Visit: Payer: Self-pay

## 2022-02-07 ENCOUNTER — Encounter (HOSPITAL_COMMUNITY): Payer: Self-pay | Admitting: Orthopaedic Surgery

## 2022-02-07 NOTE — Progress Notes (Signed)
PCP - Dr. Tedra Senegal  EKG - 11/28/21  Aspirin Instructions: Last dose around 01/23/22  ERAS Protcol - Clears unitl 0815  Anesthesia review: N  Patient verbally denies any shortness of breath, fever, cough and chest pain during phone call   -------------  SDW INSTRUCTIONS given:  Your procedure is scheduled on 02/09/22.  Report to [facility] Main Entrance "A" at 0855 A.M., and check in at the Admitting office.  Call this number if you have problems the morning of surgery:  [phone]   Remember:  Do not eat after midnight the night before your surgery  You may drink clear liquids until 0815 the morning of your surgery.   Clear liquids allowed are: Water, Non-Citrus Juices (without pulp), Carbonated Beverages, Clear Tea, Black Coffee Only, and Gatorade    Take these medicines the morning of surgery with A SIP OF WATER  amLODipine (NORVASC)  acetaminophen (TYLENOL)-if needed  As of today, STOP taking any Aspirin (unless otherwise instructed by your surgeon) Aleve, Naproxen, Ibuprofen, Motrin, Advil, Goody's, BC's, all herbal medications, fish oil, and all vitamins.                      Do not wear jewelry, make up, or nail polish            Do not wear lotions, powders, perfumes/colognes, or deodorant.            Do not shave 48 hours prior to surgery.  Men may shave face and neck.            Do not bring valuables to the hospital.            [facility] is not responsible for any belongings or valuables.  Do NOT Smoke (Tobacco/Vaping) 24 hours prior to your procedure If you use a CPAP at night, you may bring all equipment for your overnight stay.   Contacts, glasses, dentures or bridgework may not be worn into surgery.      For patients admitted to the hospital, discharge time will be determined by your treatment team.   Patients discharged the day of surgery will not be allowed to drive home, and someone needs to stay with them for 24 hours.    Special  instructions:   [facility]- Preparing For Surgery  Before surgery, you can play an important role. Because skin is not sterile, your skin needs to be as free of germs as possible. You can reduce the number of germs on your skin by washing with CHG (chlorahexidine gluconate) Soap before surgery.  CHG is an antiseptic cleaner which kills germs and bonds with the skin to continue killing germs even after washing.    Oral Hygiene is also important to reduce your risk of infection.  Remember - BRUSH YOUR TEETH THE MORNING OF SURGERY WITH YOUR REGULAR TOOTHPASTE  Please do not use if you have an allergy to CHG or antibacterial soaps. If your skin becomes reddened/irritated stop using the CHG.  Do not shave (including legs and underarms) for at least 48 hours prior to first CHG shower. It is OK to shave your face.  Please follow these instructions carefully.   Shower the NIGHT BEFORE SURGERY and the MORNING OF SURGERY with DIAL Soap.   Pat yourself dry with a CLEAN TOWEL.  Wear CLEAN PAJAMAS to bed the night before surgery  Place CLEAN SHEETS on your bed the night of your first shower and DO NOT SLEEP WITH PETS.  Day of Surgery: Please shower morning of surgery  Wear Clean/Comfortable clothing the morning of surgery Do not apply any deodorants/lotions.   Remember to brush your teeth WITH YOUR REGULAR TOOTHPASTE.   Questions were answered. Patient verbalized understanding of instructions.

## 2022-02-08 ENCOUNTER — Encounter: Payer: Self-pay | Admitting: Rehabilitative and Restorative Service Providers"

## 2022-02-08 ENCOUNTER — Ambulatory Visit (INDEPENDENT_AMBULATORY_CARE_PROVIDER_SITE_OTHER): Payer: Medicare Other | Admitting: Rehabilitative and Restorative Service Providers"

## 2022-02-08 DIAGNOSIS — M25661 Stiffness of right knee, not elsewhere classified: Secondary | ICD-10-CM

## 2022-02-08 DIAGNOSIS — R262 Difficulty in walking, not elsewhere classified: Secondary | ICD-10-CM

## 2022-02-08 DIAGNOSIS — M25561 Pain in right knee: Secondary | ICD-10-CM

## 2022-02-08 DIAGNOSIS — R600 Localized edema: Secondary | ICD-10-CM

## 2022-02-08 NOTE — Therapy (Signed)
OUTPATIENT PHYSICAL THERAPY TREATMENT NOTE   Patient Name: Kelly [name] MRN: [id number] DOB:1945/10/18, 76 y.o., female Today's Date: 02/08/2022  PCP: Elby Showers MD  REFERRING PROVIDER: Mcarthur Rossetti, MD    END OF SESSION:   PT End of Session - 02/08/22 1456     Visit Number 6    Number of Visits 28    Date for PT Re-Evaluation 04/07/22    Authorization Type Medicare/BCBS    Progress Note Due on Visit 10    PT Start Time 1430    PT Stop Time 1509    PT Time Calculation (min) 39 min    Activity Tolerance Patient tolerated treatment well;No increased pain;Patient limited by pain    Behavior During Therapy [facility] for tasks assessed/performed             Past Medical History:  Diagnosis Date   Arthritis    Cataract    Chronic kidney disease    Depression    Hyperlipidemia    Hypertension    Osteoporosis    Past Surgical History:  Procedure Laterality Date   ABDOMINAL HYSTERECTOMY     BREAST REDUCTION SURGERY     and lift   BREAST REDUCTION SURGERY Bilateral    CARPAL TUNNEL RELEASE  06/2009   right   CATARACT EXTRACTION     CHOLECYSTECTOMY     FACIAL COSMETIC SURGERY  03/2008   KNEE ARTHROSCOPY  2007   left & right   LIPOSUCTION EXTREMITIES     thighs   PARTIAL KNEE ARTHROPLASTY Right 10/02/2014   Procedure: RIGHT KNEE MEDIAL UNICOMPARTMENTAL ARTHROPLASTY;  Surgeon: Mcarthur Rossetti, MD;  Location: [facility];  Service: Orthopedics;  Laterality: Right;   REDUCTION MAMMAPLASTY     bilateral   TOTAL KNEE REVISION  10/12/2011   Procedure: TOTAL KNEE REVISION;  Surgeon: Mcarthur Rossetti, MD;  Location: [facility];  Service: Orthopedics;  Laterality: Left;  Left Total Knee Revision Arthroplasty   TOTAL KNEE REVISION Right 12/09/2021   Procedure: CONVERT RIGHT UNI KNEE ARTHROPLASTY TO TOTAL KNEE ARTHROPLASTY;  Surgeon: Mcarthur Rossetti, MD;  Location: [facility];  Service: Orthopedics;  Laterality: Right;   TRIGGER FINGER RELEASE  06/2009    right 4th finger   Patient Active Problem List   Diagnosis Date Noted   Status post revision of total knee, right 12/09/2021   Arthritis of right knee 12/08/2021   Chronic left-sided low back pain with left-sided sciatica 12/17/2019   Diabetes mellitus type 2 in obese ([place]) 12/13/2019   Chronic rhinitis 03/03/2019   Constipation 10/31/2016   Impaired glucose tolerance 10/31/2016   Stage III chronic kidney disease ([place]) 10/31/2016   Urge urinary incontinence 10/31/2016   Status post right partial knee replacement 10/02/2014   Anxiety and depression 11/06/2011   Hypertension 10/13/2010   Hyperlipidemia 10/13/2010   Osteoarthritis 10/13/2010   UNSPECIFIED IRON DEFICIENCY ANEMIA 04/11/2010   DIARRHEA 04/11/2010    REFERRING DIAG: H41.937 (ICD-10-CM) - Status post revision of total knee, right   THERAPY DIAG:  Acute pain of right knee  Stiffness of right knee, not elsewhere classified  Difficulty in walking, not elsewhere classified  Localized edema  Rationale for Evaluation and Treatment Rehabilitation  PERTINENT HISTORY:  Rt TKA 12/09/21            Rt knee surgery: uni knee arthroplasty injection 09/16/2020   hyperlipidemia, depression, HTN, osteoporosis  PRECAUTIONS:  none  SUBJECTIVE:  SUBJECTIVE STATEMENT: 3-4/10 pain this week , manipulation scheduled for tomorrow  PAIN:  Are you having pain? Yes: NPRS scale: 3-4/10 Pain location: R anterior knee  Pain description: sore, constant ache Aggravating factors: bending, pushing to hard on the bike  Relieving factors: extra strength tylenol ice/heat    OBJECTIVE: (objective measures completed at initial evaluation unless otherwise dated)  DIAGNOSTIC FINDINGS: 12/09/21 IMPRESSION: 1. Right total knee arthroplasty revision without  acute postoperative complication.images following TKA    PATIENT SURVEYS:  01/09/22: FOTO intake:  46%   COGNITION: Overall cognitive status: WFL                  SENSATION: WFL:    EDEMA:  01/09/22: Circumferential: Rt:  50 centimeters                           Left: 46 centimeters MUSCLE LENGTH: 01/09/22: Hamstrings: Right 75 deg; Left 84 deg     POSTURE: rounded shoulders and forward head   PALPATION: 01/09/22: TTP: both sides of incision site on Rt knee    LOWER EXTREMITY ROM:    ROM A: active,  P: passive Right 01/09/22 Left 01/09/22 Rt 01/11/22 Right 01/20/22  Hip flexion        Knee flexion A: 76 P: 80 A: 94 A: 76 P: 84 A: 80 P: 86  Knee extension A: -10 P: -8 A: -2 A: -8 P: -6 A (LAQ): -5   (Blank rows = not tested)   LOWER EXTREMITY MMT:   MMT Right 01/09/22 Left 01/09/22  Hip flexion 4-/5 4-/5  Hip extension      Hip abduction 4/5 4/5  Hip adduction 4/5 4/5  Hip internal rotation      Hip external rotation      Knee flexion 3+/5 5/5  Knee extension 3+/5 5/5   (Blank rows = not tested)     FUNCTIONAL TESTS:  01/09/22: 5 time sit to stand: 17 seconds c UE support   GAIT: Distance walked: 20 feet Assistive device utilized: Single point cane Level of assistance: Modified independence Comments: forward  trunk flexion, antalgic gait pattern, increased knee flexion on Rt     TODAY'S TREATMENT  02/08/2022 Recumbent bike Seat 5 for 5 minutes AAROM only T[place]gate knee flexion AAROM 3 minutes X 2 Knee flexion AAROM (Lt pushes Rt into flexion) 10X 10 seconds Quadriceps sets 2 sets of 10 for 5 seconds with heel prop   01/20/22 TherEx SciFit bike seat 9 x 8 minutes full rotations (using arms to help push into flexion) Seated Rt SLR 2x10 Seated LAQ 4# on Rt 2x10, holding 3 sec Seated AA knee flexion 10 x 10 sec hold; LLE providing overpressure Supine AA heel slides 2x10 reps on Rt ROM measurements -see above   01/18/22 TherEx SciFit  bike seat 10 x6 minutes partial rotations progressing to full rotations Tail gait position: AAROM using left LE for overpressure over Rt LE, use of theragun during.  LAQ: 3# 2 x 10 holding 3 seconds Leg press DL: 62lbs 2x10, SL: 25lbs  Manual OP into flexion reaching 80 degrees using contract/ relax.   Modalities:  Vasopneumatic 34 deg, low compression, x 10    TherEx  SciFit bike seat 10 x6 minutes partial rotations progressing to full rotations Knee flexion OP at edge of mat table after manual- only able to get to 74 degrees AAROM      Manual  Retrograde massage LE elevated  Tennis ball STM to R quad with LE elevated     PATIENT EDUCATION:  Education details: HEP, POC Person educated: Patient Education method: Explanation, Demonstration, Verbal cues, and Handouts Education comprehension: verbalized understanding, returned demonstration, and verbal cues required   HOME EXERCISE PROGRAM: Access Code: [email or web address] URL: [email or web address] Date: 02/08/2022 Prepared by: Vista Mink  Exercises - Sit to Stand  - 3 x daily - 7 x weekly - 2 sets - 10 reps - Seated Long Arc Quad  - 3 x daily - 7 x weekly - 2 sets - 10 reps - 5 seconds hold - Supine Heel Slide with Strap  - 3 x daily - 7 x weekly - 2 sets - 10 reps - Supine Straight Leg Raises  - 3 x daily - 7 x weekly - 2 sets - 10 reps - Supine Quad Set  - 3 x daily - 7 x weekly - 2 sets - 10 reps - 5 seconds hold - Supine Quadricep Sets  - 5 x daily - 7 x weekly - 2 sets - 10 reps - 5 second hold - Seated Knee Flexion AAROM  - 5 x daily - 7 x weekly - 1 sets - 1 reps - 3 minutes hold     ASSESSMENT:   CLINICAL IMPRESSION: Kelly [name] mentioned she is being manipulated tomorrow.  We focused on activities to do at home post-manipulation until she gets back in supervised PT Friday 02/10/2022.  Reassess AROM and aggressively pursue AROM gains post-manipulation.   OBJECTIVE IMPAIRMENTS: Abnormal gait, decreased  activity tolerance, decreased balance, decreased mobility, difficulty walking, decreased ROM, decreased strength, increased edema, impaired flexibility, and pain.    ACTIVITY LIMITATIONS: lifting, bending, sitting, standing, squatting, sleeping, stairs, and transfers   PARTICIPATION LIMITATIONS: cleaning, driving, and community activity   PERSONAL FACTORS: 3+ comorbidities: see above  are also affecting patient's functional outcome.    REHAB POTENTIAL: Good   CLINICAL DECISION MAKING: Stable/uncomplicated   EVALUATION COMPLEXITY: Low     GOALS: Goals reviewed with patient? Yes   SHORT TERM GOALS: (target date for Short term goals are 3 weeks 02/03/22)    1.  Patient will demonstrate independent use of home exercise program to maintain progress from in clinic treatments.   Goal status: Ongoing: 02/08/22   2.  Pt will be able to perform 5 time sit to stand in </= 12 seconds c/s UE support.                         Goal status: New   LONG TERM GOALS: (target dates for all long term goals are 12 weeks  04/07/22 )   1. Patient will demonstrate/report pain at worst less than or equal to 2/10 to facilitate minimal limitation in daily activity secondary to pain symptoms.   Goal status: On Going 02/10/2022   2. Patient will demonstrate independent use of home exercise program to facilitate ability to maintain/progress functional gains from skilled physical therapy services.   Goal status: On Going 02/08/2022   3. Patient will demonstrate FOTO outcome > or = 56 % to indicate reduced disability due to condition.   Goal status: New   4.  Patient will demonstrate Rt  LE MMT >/= 4+/5 throughout to faciltiate usual transfers, stairs, squatting at [facility] for daily life.  Goal status: New   5.  Patient will be able to amb > 800 feet with LRAD with normalized gait pattern on community surfaces.  Goal status: On Going 02/08/2022   6.  Pt will be able to navigate up and down 5 stairs with  single hand rail with step over step pattern.    Goal status: On Going 02/08/2022   7. Pt will improve her Rt knee flexion to >/= 100 degrees for improved functional mobility.              Goal status: On Going 02/08/2022    PLAN:   PT FREQUENCY: 2-3x/ week    PT DURATION: 12 weeks   PLANNED INTERVENTIONS: Therapeutic exercises, Therapeutic activity, Neuro Muscular re-education, Balance training, Gait training, Patient/Family education, Joint mobilization, Stair training, DME instructions, Dry Needling, Electrical stimulation, Traction, Cryotherapy, vasopneumatic deviceMoist heat, Taping, Ultrasound, Ionotophoresis '4mg'$ /ml Dexamethasone, and Manual therapy.  All included unless contraindicated   PLAN FOR NEXT SESSION: Progress note with FOTO and re-certification post-manipulation     Farley Ly PT, MPT 02/08/22 3:17 PM

## 2022-02-09 ENCOUNTER — Encounter (HOSPITAL_COMMUNITY)
Admission: RE | Disposition: A | Discharge status: Home / Self Care | Payer: Self-pay | Source: Home / Self Care | Attending: Orthopaedic Surgery

## 2022-02-09 ENCOUNTER — Ambulatory Visit (HOSPITAL_COMMUNITY)
Admission: RE | Admit: 2022-02-09 | Discharge: 2022-02-09 | Disposition: A | Discharge status: Home / Self Care | Payer: Medicare Other | Attending: Orthopaedic Surgery | Admitting: Orthopaedic Surgery

## 2022-02-09 ENCOUNTER — Encounter (HOSPITAL_COMMUNITY): Payer: Self-pay | Admitting: Orthopaedic Surgery

## 2022-02-09 ENCOUNTER — Other Ambulatory Visit: Payer: Self-pay

## 2022-02-09 ENCOUNTER — Ambulatory Visit (HOSPITAL_COMMUNITY): Payer: Medicare Other | Admitting: Anesthesiology

## 2022-02-09 ENCOUNTER — Ambulatory Visit (HOSPITAL_BASED_OUTPATIENT_CLINIC_OR_DEPARTMENT_OTHER): Payer: Medicare Other | Admitting: Anesthesiology

## 2022-02-09 DIAGNOSIS — N189 Chronic kidney disease, unspecified: Secondary | ICD-10-CM | POA: Diagnosis not present

## 2022-02-09 DIAGNOSIS — T8482XD Fibrosis due to internal orthopedic prosthetic devices, implants and grafts, subsequent encounter: Secondary | ICD-10-CM

## 2022-02-09 DIAGNOSIS — Z8616 Personal history of COVID-19: Secondary | ICD-10-CM | POA: Diagnosis not present

## 2022-02-09 DIAGNOSIS — Z7982 Long term (current) use of aspirin: Secondary | ICD-10-CM | POA: Insufficient documentation

## 2022-02-09 DIAGNOSIS — M1990 Unspecified osteoarthritis, unspecified site: Secondary | ICD-10-CM | POA: Insufficient documentation

## 2022-02-09 DIAGNOSIS — I129 Hypertensive chronic kidney disease with stage 1 through stage 4 chronic kidney disease, or unspecified chronic kidney disease: Secondary | ICD-10-CM | POA: Insufficient documentation

## 2022-02-09 DIAGNOSIS — D649 Anemia, unspecified: Secondary | ICD-10-CM

## 2022-02-09 DIAGNOSIS — I10 Essential (primary) hypertension: Secondary | ICD-10-CM

## 2022-02-09 DIAGNOSIS — M24661 Ankylosis, right knee: Secondary | ICD-10-CM | POA: Diagnosis not present

## 2022-02-09 DIAGNOSIS — E119 Type 2 diabetes mellitus without complications: Secondary | ICD-10-CM | POA: Insufficient documentation

## 2022-02-09 DIAGNOSIS — T8482XA Fibrosis due to internal orthopedic prosthetic devices, implants and grafts, initial encounter: Secondary | ICD-10-CM | POA: Diagnosis not present

## 2022-02-09 DIAGNOSIS — Z96653 Presence of artificial knee joint, bilateral: Secondary | ICD-10-CM | POA: Diagnosis not present

## 2022-02-09 DIAGNOSIS — N289 Disorder of kidney and ureter, unspecified: Secondary | ICD-10-CM | POA: Diagnosis not present

## 2022-02-09 HISTORY — PX: KNEE CLOSED REDUCTION: SHX995

## 2022-02-09 SURGERY — MANIPULATION, KNEE, CLOSED
Anesthesia: Monitor Anesthesia Care | Site: Knee | Laterality: Right

## 2022-02-09 MED ORDER — MIDAZOLAM HCL 2 MG/2ML IJ SOLN
INTRAMUSCULAR | Status: AC
  Filled 2022-02-09: qty 2

## 2022-02-09 MED ORDER — CELECOXIB 200 MG PO CAPS
200.0000 mg | ORAL_CAPSULE | Freq: Once | ORAL | Status: AC
  Administered 2022-02-09: 200 mg via ORAL
  Filled 2022-02-09: qty 1

## 2022-02-09 MED ORDER — DEXAMETHASONE SODIUM PHOSPHATE 10 MG/ML IJ SOLN
INTRAMUSCULAR | Status: DC | PRN
  Administered 2022-02-09: 5 mg via INTRAVENOUS

## 2022-02-09 MED ORDER — METHYLPREDNISOLONE ACETATE 40 MG/ML IJ SUSP
INTRAMUSCULAR | Status: DC | PRN
  Administered 2022-02-09: 40 mg

## 2022-02-09 MED ORDER — METHOCARBAMOL 500 MG PO TABS
500.0000 mg | ORAL_TABLET | Freq: Once | ORAL | Status: AC
  Administered 2022-02-09: 500 mg via ORAL

## 2022-02-09 MED ORDER — CHLORHEXIDINE GLUCONATE 0.12 % MT SOLN
OROMUCOSAL | Status: AC
  Administered 2022-02-09: 15 mL via OROMUCOSAL
  Filled 2022-02-09: qty 15

## 2022-02-09 MED ORDER — ONDANSETRON HCL 4 MG/2ML IJ SOLN
INTRAMUSCULAR | Status: DC | PRN
  Administered 2022-02-09: 4 mg via INTRAVENOUS

## 2022-02-09 MED ORDER — HYDROCODONE-ACETAMINOPHEN 7.5-325 MG PO TABS: 1.0000 | ORAL_TABLET | Freq: Four times a day (QID) | ORAL | 0 refills | Status: DC | PRN

## 2022-02-09 MED ORDER — ACETAMINOPHEN 500 MG PO TABS
1000.0000 mg | ORAL_TABLET | Freq: Once | ORAL | Status: AC
  Administered 2022-02-09: 1000 mg via ORAL
  Filled 2022-02-09: qty 2

## 2022-02-09 MED ORDER — PROPOFOL 10 MG/ML IV BOLUS
INTRAVENOUS | Status: DC | PRN
  Administered 2022-02-09: 70 mg via INTRAVENOUS
  Administered 2022-02-09: 20 mg via INTRAVENOUS
  Administered 2022-02-09: 60 mg via INTRAVENOUS

## 2022-02-09 MED ORDER — FENTANYL CITRATE (PF) 100 MCG/2ML IJ SOLN
INTRAMUSCULAR | Status: AC
  Filled 2022-02-09: qty 2

## 2022-02-09 MED ORDER — AMISULPRIDE (ANTIEMETIC) 5 MG/2ML IV SOLN: 10.0000 mg | Freq: Once | INTRAVENOUS | Status: DC | PRN

## 2022-02-09 MED ORDER — FENTANYL CITRATE (PF) 100 MCG/2ML IJ SOLN
25.0000 ug | INTRAMUSCULAR | Status: DC | PRN
  Administered 2022-02-09 (×3): 50 ug via INTRAVENOUS

## 2022-02-09 MED ORDER — FENTANYL CITRATE (PF) 250 MCG/5ML IJ SOLN
INTRAMUSCULAR | Status: AC
  Filled 2022-02-09: qty 5

## 2022-02-09 MED ORDER — BUPIVACAINE HCL (PF) 0.25 % IJ SOLN
INTRAMUSCULAR | Status: AC
  Filled 2022-02-09: qty 10

## 2022-02-09 MED ORDER — METHYLPREDNISOLONE ACETATE 40 MG/ML IJ SUSP
INTRAMUSCULAR | Status: AC
  Filled 2022-02-09: qty 1

## 2022-02-09 MED ORDER — FENTANYL CITRATE (PF) 100 MCG/2ML IJ SOLN
INTRAMUSCULAR | Status: DC | PRN
  Administered 2022-02-09: 100 ug via INTRAVENOUS

## 2022-02-09 MED ORDER — CHLORHEXIDINE GLUCONATE 0.12 % MT SOLN: 15.0000 mL | Freq: Once | OROMUCOSAL | Status: AC

## 2022-02-09 MED ORDER — BUPIVACAINE HCL (PF) 0.25 % IJ SOLN
INTRAMUSCULAR | Status: DC | PRN
  Administered 2022-02-09: 4 mL

## 2022-02-09 MED ORDER — OXYCODONE HCL 5 MG PO TABS
5.0000 mg | ORAL_TABLET | Freq: Once | ORAL | Status: AC
  Administered 2022-02-09: 5 mg via ORAL

## 2022-02-09 MED ORDER — LIDOCAINE 2% (20 MG/ML) 5 ML SYRINGE
INTRAMUSCULAR | Status: DC | PRN
  Administered 2022-02-09: 60 mg via INTRAVENOUS

## 2022-02-09 MED ORDER — PROPOFOL 10 MG/ML IV BOLUS
INTRAVENOUS | Status: AC
  Filled 2022-02-09: qty 20

## 2022-02-09 MED ORDER — ORAL CARE MOUTH RINSE: 15.0000 mL | Freq: Once | OROMUCOSAL | Status: AC

## 2022-02-09 MED ORDER — LACTATED RINGERS IV SOLN: INTRAVENOUS | Status: DC

## 2022-02-09 SURGICAL SUPPLY — 1 items: KIT TURNOVER KIT B (KITS) ×1 IMPLANT

## 2022-02-09 NOTE — Anesthesia Preprocedure Evaluation (Addendum)
Anesthesia Evaluation  Patient identified by MRN, date of birth, ID band Patient awake    Reviewed: Allergy & Precautions, NPO status , Patient's Chart, lab work & pertinent test results  Airway Mallampati: II  TM Distance: >3 FB     Dental   Pulmonary neg pulmonary ROS   breath sounds clear to auscultation       Cardiovascular hypertension, Pt. on medications  Rhythm:Regular Rate:Normal     Neuro/Psych  Neuromuscular disease    GI/Hepatic negative GI ROS, Neg liver ROS,,,  Endo/Other  diabetes    Renal/GU Renal InsufficiencyRenal disease     Musculoskeletal  (+) Arthritis ,    Abdominal   Peds  Hematology  (+) Blood dyscrasia, anemia   Anesthesia Other Findings   Reproductive/Obstetrics                             Anesthesia Physical Anesthesia Plan  ASA: 2  Anesthesia Plan: MAC   Post-op Pain Management: Minimal or no pain anticipated   Induction: Intravenous  PONV Risk Score and Plan: 3 and Ondansetron and Dexamethasone  Airway Management Planned: Natural Airway, Mask and Nasal Cannula  Additional Equipment:   Intra-op Plan:   Post-operative Plan:   Informed Consent: I have reviewed the patients History and Physical, chart, labs and discussed the procedure including the risks, benefits and alternatives for the proposed anesthesia with the patient or authorized representative who has indicated his/her understanding and acceptance.     Dental advisory given  Plan Discussed with:   Anesthesia Plan Comments:        Anesthesia Quick Evaluation

## 2022-02-09 NOTE — H&P (Signed)
Kelly [name] is an 76 y.o. female.   Chief Complaint: Right knee stiffness with decreased motion HPI: The patient is a 76 year old female who recently underwent a right total knee arthroplasty.  Although she has been through aggressive physical therapy she has not been able to obtain significant lection of her right total knee arthroplasty.  It has now been about a week since her surgery.  We have recommended a manipulation under anesthesia at this standpoint for her right knee.  Past Medical History:  Diagnosis Date   Arthritis    Cataract    Chronic kidney disease    Depression    Hyperlipidemia    Hypertension    Osteoporosis     Past Surgical History:  Procedure Laterality Date   ABDOMINAL HYSTERECTOMY     BREAST REDUCTION SURGERY     and lift   BREAST REDUCTION SURGERY Bilateral    CARPAL TUNNEL RELEASE  06/2009   right   CATARACT EXTRACTION     CHOLECYSTECTOMY     FACIAL COSMETIC SURGERY  03/2008   KNEE ARTHROSCOPY  2007   left & right   LIPOSUCTION EXTREMITIES     thighs   PARTIAL KNEE ARTHROPLASTY Right 10/02/2014   Procedure: RIGHT KNEE MEDIAL UNICOMPARTMENTAL ARTHROPLASTY;  Surgeon: Mcarthur Rossetti, MD;  Location: [facility];  Service: Orthopedics;  Laterality: Right;   REDUCTION MAMMAPLASTY     bilateral   TOTAL KNEE REVISION  10/12/2011   Procedure: TOTAL KNEE REVISION;  Surgeon: Mcarthur Rossetti, MD;  Location: [facility];  Service: Orthopedics;  Laterality: Left;  Left Total Knee Revision Arthroplasty   TOTAL KNEE REVISION Right 12/09/2021   Procedure: CONVERT RIGHT UNI KNEE ARTHROPLASTY TO TOTAL KNEE ARTHROPLASTY;  Surgeon: Mcarthur Rossetti, MD;  Location: [facility];  Service: Orthopedics;  Laterality: Right;   TRIGGER FINGER RELEASE  06/2009   right 4th finger    Family History  Problem Relation Age of Onset   Heart disease Mother    Alcohol abuse Father    Hypertension Father    Cancer Sister    Hypertension Sister    Breast cancer Neg  Hx    Colon cancer Neg Hx    Colon polyps Neg Hx    Esophageal cancer Neg Hx    Stomach cancer Neg Hx    Rectal cancer Neg Hx    Social History:  reports that she has never smoked. She has never used smokeless tobacco. She reports current alcohol use of about 1.0 standard drink of alcohol per week. She reports that she does not use drugs.  Allergies: No Known Allergies  Medications Prior to Admission  Medication Sig Dispense Refill   acetaminophen (TYLENOL) 500 MG tablet Take 1,000 mg by mouth every 6 (six) hours as needed (pain.).     amLODipine (NORVASC) 5 MG tablet TAKE 1 TABLET BY MOUTH EVERY DAY 90 tablet 1   Cyanocobalamin (VITAMIN B-12 PO) Take 2,500 mcg by mouth daily.     Ferrous Sulfate (IRON PO) Take 1 tablet by mouth in the morning.     methocarbamol (ROBAXIN) 500 MG tablet Take 1 tablet (500 mg total) by mouth every 6 (six) hours as needed for muscle spasms. 40 tablet 1   Omega-3 Fatty Acids (OMEGA 3 PO) Take 1 capsule by mouth in the morning. Omega XL     rosuvastatin (CRESTOR) 10 MG tablet TAKE 1 TABLET(10 MG) BY MOUTH DAILY 90 tablet 1   TURMERIC PO Take 1,000 mg by mouth  in the morning.     aspirin 81 MG chewable tablet Chew 1 tablet (81 mg total) by mouth 2 (two) times daily. (Patient not taking: Reported on 02/03/2022) 30 tablet 1   oxyCODONE (OXY IR/ROXICODONE) 5 MG immediate release tablet Take 1-2 tablets (5-10 mg total) by mouth every 6 (six) hours as needed for moderate pain (pain score 4-6). (Patient not taking: Reported on 02/03/2022) 30 tablet 0    No results found for this or any previous visit (from the past 48 hour(s)). No results found.  Review of Systems  All other systems reviewed and are negative.   Blood pressure (!) 165/93, pulse 74, temperature 97.8 F (36.6 C), temperature source Oral, resp. rate 18, height 5' (1.524 m), weight 86.2 kg, SpO2 95 %. Physical Exam Vitals reviewed.  Constitutional:      Appearance: Normal appearance.  HENT:      Head: Normocephalic and atraumatic.  Eyes:     Extraocular Movements: Extraocular movements intact.     Pupils: Pupils are equal, round, and reactive to light.  Cardiovascular:     Rate and Rhythm: Normal rate.  Pulmonary:     Effort: Pulmonary effort is normal.     Breath sounds: Normal breath sounds.  Abdominal:     Palpations: Abdomen is soft.  Musculoskeletal:     Cervical back: Normal range of motion and neck supple.     Right knee: Effusion present. Decreased range of motion.  Neurological:     Mental Status: She is alert and oriented to person, place, and time.  Psychiatric:        Behavior: Behavior normal.      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  The risks and benefits of this procedure have been described in detail and informed consent is obtained.  The right operative knee has been marked.  Mcarthur Rossetti, MD 02/09/2022, 10:54 AM

## 2022-02-09 NOTE — Discharge Instructions (Signed)
Increase your activity as comfort allows. You may put all of your weight on your right lower extremity. Work aggressively on bending your right knee.

## 2022-02-09 NOTE — Anesthesia Procedure Notes (Signed)
Procedure Name: MAC Date/Time: 02/09/2022 11:40 AM  Performed by: Trinna Post., CRNAPre-anesthesia Checklist: Patient identified, Emergency Drugs available, Suction available, Patient being monitored and Timeout performed Patient Re-evaluated:Patient Re-evaluated prior to induction Oxygen Delivery Method: Nasal cannula Preoxygenation: Pre-oxygenation with 100% oxygen Induction Type: IV induction Placement Confirmation: positive ETCO2

## 2022-02-09 NOTE — Op Note (Signed)
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 Ninfa Linden, MD  Anesthesia: #1 mask ventilation with IV sedation, #2 local  Complications: None  Indications: The patient is a 76 year old female who underwent a total knee arthroplasty of her right knee at the end of October of this year.  This was revision of a partial knee arthroplasty that had failed and this was revised to a total knee arthroplasty.  She has a hard time with postoperative pain.  Her postoperative course was also complicated by COVID which decreased her ability to get to physical therapy.  At this point her flexion is quite limited of that right knee and we have recommended a manipulation under anesthesia.  She actually has a history of a left knee arthroplasty and had a revision of that knee years ago.  That knee also required a manipulation so she is fully aware of the risk and benefits of this procedure.  Procedure description: After informed consent was obtained appropriate right knee was marked, the patient was brought on her stretcher back to the operating room.  She was kept on the stretcher.  Asked ventilation and IV sedation was obtained by anesthesia.  A timeout was called and she is advised correct patient the correct right knee.  I then was able to gently manipulate her right knee and I got her from 60 degrees of flexion to almost 120 degrees flexion.  I put her right knee through several cycles of motion sure that we had obtained full extension and almost full flexion.  After this we then placed a mixture of plain Marcaine and Depo-Medrol into the knee joint.  The patient was then taken recovery in stable addition.  She resumed physical therapy tomorrow.  She will be discharged from the PACU to home  today.

## 2022-02-09 NOTE — Transfer of Care (Signed)
Immediate Anesthesia Transfer of Care Note  Patient: Kelly [name]  Procedure(s) Performed: CLOSED MANIPULATION RIGHT TOTAL KNEE ARTHROPLASTY (Right: Knee)  Patient Location: PACU  Anesthesia Type:MAC  Level of Consciousness: awake, alert , and oriented  Airway & Oxygen Therapy: Patient Spontanous Breathing and Patient connected to nasal cannula oxygen  Post-op Assessment: Report given to RN and Post -op Vital signs reviewed and stable  Post vital signs: Reviewed and stable  Last Vitals:  Vitals Value Taken Time  BP 137/88 02/09/22 1200  Temp    Pulse 67 02/09/22 1201  Resp 14 02/09/22 1201  SpO2 97 % 02/09/22 1201  Vitals shown include unvalidated device data.  Last Pain:  Vitals:   02/09/22 0933  TempSrc:   PainSc: 0-No pain         Complications: No notable events documented.

## 2022-02-09 NOTE — Anesthesia Postprocedure Evaluation (Signed)
Anesthesia Post Note  Patient: Kelly [name]  Procedure(s) Performed: CLOSED MANIPULATION RIGHT TOTAL KNEE ARTHROPLASTY (Right: Knee)     Patient location during evaluation: PACU Anesthesia Type: MAC Level of consciousness: awake and alert Pain management: pain level controlled Vital Signs Assessment: post-procedure vital signs reviewed and stable Respiratory status: spontaneous breathing, nonlabored ventilation, respiratory function stable and patient connected to nasal cannula oxygen Cardiovascular status: stable and blood pressure returned to baseline Postop Assessment: no apparent nausea or vomiting Anesthetic complications: no  No notable events documented.  Last Vitals:  Vitals:   02/09/22 1245 02/09/22 1300  BP: (!) 170/95 (!) 161/91  Pulse: 66 71  Resp: 10 17  Temp:    SpO2: 99% 98%    Last Pain:  Vitals:   02/09/22 1300  TempSrc:   PainSc: 4                  Tiajuana Amass

## 2022-02-10 ENCOUNTER — Encounter (HOSPITAL_COMMUNITY): Payer: Self-pay | Admitting: Orthopaedic Surgery

## 2022-02-10 ENCOUNTER — Ambulatory Visit (INDEPENDENT_AMBULATORY_CARE_PROVIDER_SITE_OTHER): Payer: Medicare Other | Admitting: Rehabilitative and Restorative Service Providers"

## 2022-02-10 DIAGNOSIS — M25561 Pain in right knee: Secondary | ICD-10-CM | POA: Diagnosis not present

## 2022-02-10 DIAGNOSIS — R262 Difficulty in walking, not elsewhere classified: Secondary | ICD-10-CM

## 2022-02-10 DIAGNOSIS — R600 Localized edema: Secondary | ICD-10-CM

## 2022-02-10 DIAGNOSIS — M25661 Stiffness of right knee, not elsewhere classified: Secondary | ICD-10-CM | POA: Diagnosis not present

## 2022-02-10 NOTE — Therapy (Signed)
OUTPATIENT PHYSICAL THERAPY TREATMENT/PROGRESS NOTE   Patient Name: Kelly [name] MRN: [id number] DOB:July 25, 1945, 76 y.o., female Today's Date: 02/10/2022  PCP: Elby Showers MD  REFERRING PROVIDER: Mcarthur Rossetti, MD   Progress Note Reporting Period 01/09/2022 to 02/10/2022  See note below for Objective Data and Assessment of Progress/Goals.      END OF SESSION:   PT End of Session - 02/10/22 1429     Visit Number 7    Number of Visits 28    Date for PT Re-Evaluation 04/07/22    Authorization Type Medicare/BCBS    Progress Note Due on Visit 10    PT Start Time 1429    PT Stop Time 1524    PT Time Calculation (min) 55 min    Activity Tolerance Patient tolerated treatment well;No increased pain;Patient limited by pain    Behavior During Therapy [facility] for tasks assessed/performed            Past Medical History:  Diagnosis Date   Arthritis    Cataract    Chronic kidney disease    Depression    Hyperlipidemia    Hypertension    Osteoporosis    Past Surgical History:  Procedure Laterality Date   ABDOMINAL HYSTERECTOMY     BREAST REDUCTION SURGERY     and lift   BREAST REDUCTION SURGERY Bilateral    CARPAL TUNNEL RELEASE  06/2009   right   CATARACT EXTRACTION     CHOLECYSTECTOMY     FACIAL COSMETIC SURGERY  03/2008   KNEE ARTHROSCOPY  2007   left & right   KNEE CLOSED REDUCTION Right 02/09/2022   Procedure: CLOSED MANIPULATION RIGHT TOTAL KNEE ARTHROPLASTY;  Surgeon: Mcarthur Rossetti, MD;  Location: [place];  Service: Orthopedics;  Laterality: Right;   LIPOSUCTION EXTREMITIES     thighs   PARTIAL KNEE ARTHROPLASTY Right 10/02/2014   Procedure: RIGHT KNEE MEDIAL UNICOMPARTMENTAL ARTHROPLASTY;  Surgeon: Mcarthur Rossetti, MD;  Location: [facility];  Service: Orthopedics;  Laterality: Right;   REDUCTION MAMMAPLASTY     bilateral   TOTAL KNEE REVISION  10/12/2011   Procedure: TOTAL KNEE REVISION;  Surgeon: Mcarthur Rossetti, MD;   Location: [facility];  Service: Orthopedics;  Laterality: Left;  Left Total Knee Revision Arthroplasty   TOTAL KNEE REVISION Right 12/09/2021   Procedure: CONVERT RIGHT UNI KNEE ARTHROPLASTY TO TOTAL KNEE ARTHROPLASTY;  Surgeon: Mcarthur Rossetti, MD;  Location: [facility];  Service: Orthopedics;  Laterality: Right;   TRIGGER FINGER RELEASE  06/2009   right 4th finger   Patient Active Problem List   Diagnosis Date Noted   Arthrofibrosis of right total knee arthroplasty ([place]) 02/09/2022   Status post revision of total knee, right 12/09/2021   Arthritis of right knee 12/08/2021   Chronic left-sided low back pain with left-sided sciatica 12/17/2019   Diabetes mellitus type 2 in obese ([place]) 12/13/2019   Chronic rhinitis 03/03/2019   Constipation 10/31/2016   Impaired glucose tolerance 10/31/2016   Stage III chronic kidney disease ([place]) 10/31/2016   Urge urinary incontinence 10/31/2016   Status post right partial knee replacement 10/02/2014   Anxiety and depression 11/06/2011   Hypertension 10/13/2010   Hyperlipidemia 10/13/2010   Osteoarthritis 10/13/2010   UNSPECIFIED IRON DEFICIENCY ANEMIA 04/11/2010   DIARRHEA 04/11/2010    REFERRING DIAG: K02.542 (ICD-10-CM) - Status post revision of total knee, right   THERAPY DIAG:  Acute pain of right knee  Stiffness of right knee, not elsewhere classified  Difficulty  in walking, not elsewhere classified  Localized edema  Rationale for Evaluation and Treatment Rehabilitation  PERTINENT HISTORY:  Rt TKA 12/09/21            Rt knee surgery: uni knee arthroplasty injection 09/16/2020   hyperlipidemia, depression, HTN, osteoporosis  PRECAUTIONS:  none  SUBJECTIVE:                                                                                                                                                                                      SUBJECTIVE STATEMENT: 5-6/10 since manipulation.  She reports poor sleep last night.  PAIN:  Are  you having pain? Yes: NPRS scale: 5-6/10 Pain location: R anterior knee  Pain description: sore, constant ache Aggravating factors: bending, pushing to hard on the bike  Relieving factors: extra strength tylenol ice/heat    OBJECTIVE: (objective measures completed at initial evaluation unless otherwise dated)  DIAGNOSTIC FINDINGS: 12/09/21 IMPRESSION: 1. Right total knee arthroplasty revision without acute postoperative complication.images following TKA    PATIENT SURVEYS:  02/10/2022: FOTO 53 (was 46, Goal 56)  01/09/22: FOTO intake:  46%   COGNITION: Overall cognitive status: WFL                  SENSATION: WFL:    EDEMA:  01/09/22: Circumferential: Rt:  50 centimeters                           Left: 46 centimeters MUSCLE LENGTH: 01/09/22: Hamstrings: Right 75 deg; Left 84 deg     POSTURE: rounded shoulders and forward head   PALPATION: 01/09/22: TTP: both sides of incision site on Rt knee    LOWER EXTREMITY ROM:    ROM A: active,  P: passive Right 01/09/22 Left 01/09/22 Rt 01/11/22 Right 01/20/22 Right 02/10/2022 Post-manipulation  Hip flexion         Knee flexion A: 76 P: 80 A: 94 A: 76 P: 84 A: 80 P: 86 Active 78  Knee extension A: -10 P: -8 A: -2 A: -8 P: -6 A (LAQ): -5 Active -2   (Blank rows = not tested)   LOWER EXTREMITY MMT:   MMT Right 01/09/22 Left 01/09/22  Hip flexion 4-/5 4-/5  Hip extension      Hip abduction 4/5 4/5  Hip adduction 4/5 4/5  Hip internal rotation      Hip external rotation      Knee flexion 3+/5 5/5  Knee extension 3+/5 5/5   (Blank rows = not tested)     FUNCTIONAL TESTS:  01/09/22: 5 time sit to stand: 17 seconds c UE support  GAIT: Distance walked: 20 feet Assistive device utilized: Single point cane Level of assistance: Modified independence Comments: forward  trunk flexion, antalgic gait pattern, increased knee flexion on Rt     TODAY'S TREATMENT  02/10/2022 Recumbent bike Seat 5 for 5 minutes  AAROM only Tailgate knee flexion AAROM 1 minutes X 2 Knee flexion AAROM (Lt pushes Rt into flexion) 10X 10 seconds Quadriceps sets 10X 5 seconds with heel prop  Functional Activities: Double Leg Press 75# full extension and stretch into flexion 10X Single Leg Press 25# full extension and stretch into flexion 10X  Manual overpressure into knee flexion 10X 10 seconds right knee with PT  Vaso Rt knee Medium Pressure 10 minutes 34*   02/08/2022 Recumbent bike Seat 5 for 5 minutes AAROM only Tailgate knee flexion AAROM 3 minutes X 2 Knee flexion AAROM (Lt pushes Rt into flexion) 10X 10 seconds Quadriceps sets 2 sets of 10 for 5 seconds with heel prop   01/20/22 TherEx SciFit bike seat 9 x 8 minutes full rotations (using arms to help push into flexion) Seated Rt SLR 2x10 Seated LAQ 4# on Rt 2x10, holding 3 sec Seated AA knee flexion 10 x 10 sec hold; LLE providing overpressure Supine AA heel slides 2x10 reps on Rt ROM measurements -see above    PATIENT EDUCATION:  Education details: HEP, POC Person educated: Patient Education method: Consulting civil engineer, Demonstration, Verbal cues, and Handouts Education comprehension: verbalized understanding, returned demonstration, and verbal cues required   HOME EXERCISE PROGRAM: Access Code: [email or web address] URL: [email or web address] Date: 02/08/2022 Prepared by: Vista Mink  Exercises - Sit to Stand  - 3 x daily - 7 x weekly - 2 sets - 10 reps - Seated Long Arc Quad  - 3 x daily - 7 x weekly - 2 sets - 10 reps - 5 seconds hold - Supine Heel Slide with Strap  - 3 x daily - 7 x weekly - 2 sets - 10 reps - Supine Straight Leg Raises  - 3 x daily - 7 x weekly - 2 sets - 10 reps - Supine Quad Set  - 3 x daily - 7 x weekly - 2 sets - 10 reps - 5 seconds hold - Supine Quadricep Sets  - 5 x daily - 7 x weekly - 2 sets - 10 reps - 5 second hold - Seated Knee Flexion AAROM  - 5 x daily - 7 x weekly - 1 sets - 1 reps - 3 minutes hold      ASSESSMENT:   CLINICAL IMPRESSION: Abigial had 2-0-78 degrees of AROM today post-manipulation yesterday.  Flexion AROM was the focus of her visit and flexion AROM is the focus of her HEP.  Continue Flexion AROM work along with appropriate strength and edema control to meet LTGs.   OBJECTIVE IMPAIRMENTS: Abnormal gait, decreased activity tolerance, decreased balance, decreased mobility, difficulty walking, decreased ROM, decreased strength, increased edema, impaired flexibility, and pain.    ACTIVITY LIMITATIONS: lifting, bending, sitting, standing, squatting, sleeping, stairs, and transfers   PARTICIPATION LIMITATIONS: cleaning, driving, and community activity   PERSONAL FACTORS: 3+ comorbidities: see above  are also affecting patient's functional outcome.    REHAB POTENTIAL: Good   CLINICAL DECISION MAKING: Stable/uncomplicated   EVALUATION COMPLEXITY: Low     GOALS: Goals reviewed with patient? Yes   SHORT TERM GOALS: (target date for Short term goals are 3 weeks 02/03/22)    1.  Patient will demonstrate independent use of home exercise program to maintain progress from in  clinic treatments.   Goal status: On going: 02/10/22   2.  Pt will be able to perform 5 time sit to stand in </= 12 seconds c/s UE support.                         Goal status: On going 02/10/2022   LONG TERM GOALS: (target dates for all long term goals are 12 weeks  04/07/22 )   1. Patient will demonstrate/report pain at worst less than or equal to 2/10 to facilitate minimal limitation in daily activity secondary to pain symptoms.   Goal status: On Going 02/10/2022   2. Patient will demonstrate independent use of home exercise program to facilitate ability to maintain/progress functional gains from skilled physical therapy services.   Goal status: On Going 02/10/2022   3. Patient will demonstrate FOTO outcome > or = 56 % to indicate reduced disability due to condition.   Goal status: On Going  02/10/2022   4.  Patient will demonstrate Rt  LE MMT >/= 4+/5 throughout to faciltiate usual transfers, stairs, squatting at [facility] for daily life.  Goal status: On Going 02/10/2022   5.  Patient will be able to amb > 800 feet with LRAD with normalized gait pattern on community surfaces.    Goal status: On Going 02/10/2022   6.  Pt will be able to navigate up and down 5 stairs with single hand rail with step over step pattern.    Goal status: On Going 02/08/2022   7. Pt will improve her Rt knee flexion to >/= 100 degrees for improved functional mobility.              Goal status: On Going 02/10/2022    PLAN:   PT FREQUENCY: Daily for 2 weeks then likely 2-3X/week for 2 weeks   PT DURATION: 12 weeks   PLANNED INTERVENTIONS: Therapeutic exercises, Therapeutic activity, Neuro Muscular re-education, Balance training, Gait training, Patient/Family education, Joint mobilization, Stair training, DME instructions, Dry Needling, Electrical stimulation, Traction, Cryotherapy, vasopneumatic deviceMoist heat, Taping, Ultrasound, Ionotophoresis '4mg'$ /ml Dexamethasone, and Manual therapy.  All included unless contraindicated   PLAN FOR NEXT SESSION: Flexion AROM     Farley Ly PT, MPT 02/10/22 3:39 PM

## 2022-02-14 ENCOUNTER — Ambulatory Visit (INDEPENDENT_AMBULATORY_CARE_PROVIDER_SITE_OTHER): Payer: Medicare Other | Admitting: Rehabilitative and Restorative Service Providers"

## 2022-02-14 ENCOUNTER — Encounter: Payer: Self-pay | Admitting: Rehabilitative and Restorative Service Providers"

## 2022-02-14 DIAGNOSIS — R600 Localized edema: Secondary | ICD-10-CM

## 2022-02-14 DIAGNOSIS — M25661 Stiffness of right knee, not elsewhere classified: Secondary | ICD-10-CM | POA: Diagnosis not present

## 2022-02-14 DIAGNOSIS — R262 Difficulty in walking, not elsewhere classified: Secondary | ICD-10-CM

## 2022-02-14 DIAGNOSIS — M25561 Pain in right knee: Secondary | ICD-10-CM | POA: Diagnosis not present

## 2022-02-14 NOTE — Therapy (Signed)
OUTPATIENT PHYSICAL THERAPY TREATMENT   Patient Name: Kelly [name] MRN: [id number] DOB:09-Sep-1945, 77 y.o., female Today's Date: 02/14/2022  PCP: Elby Showers MD  REFERRING PROVIDER: Mcarthur Rossetti, MD    END OF SESSION:   PT End of Session - 02/14/22 0809     Visit Number 8    Number of Visits 28    Date for PT Re-Evaluation 04/07/22    Authorization Type Medicare/BCBS    Progress Note Due on Visit 18    PT Start Time 0803    PT Stop Time 6144    PT Time Calculation (min) 50 min    Activity Tolerance Patient tolerated treatment well;No increased pain    Behavior During Therapy WFL for tasks assessed/performed             Past Medical History:  Diagnosis Date   Arthritis    Cataract    Chronic kidney disease    Depression    Hyperlipidemia    Hypertension    Osteoporosis    Past Surgical History:  Procedure Laterality Date   ABDOMINAL HYSTERECTOMY     BREAST REDUCTION SURGERY     and lift   BREAST REDUCTION SURGERY Bilateral    CARPAL TUNNEL RELEASE  06/2009   right   CATARACT EXTRACTION     CHOLECYSTECTOMY     FACIAL COSMETIC SURGERY  03/2008   KNEE ARTHROSCOPY  2007   left & right   KNEE CLOSED REDUCTION Right 02/09/2022   Procedure: CLOSED MANIPULATION RIGHT TOTAL KNEE ARTHROPLASTY;  Surgeon: Mcarthur Rossetti, MD;  Location: [place];  Service: Orthopedics;  Laterality: Right;   LIPOSUCTION EXTREMITIES     thighs   PARTIAL KNEE ARTHROPLASTY Right 10/02/2014   Procedure: RIGHT KNEE MEDIAL UNICOMPARTMENTAL ARTHROPLASTY;  Surgeon: Mcarthur Rossetti, MD;  [facility];  Service: Orthopedics;  Laterality: Right;   REDUCTION MAMMAPLASTY     bilateral   TOTAL KNEE REVISION  10/12/2011   Procedure: TOTAL KNEE REVISION;  Surgeon: Mcarthur Rossetti, MD;  [facility];  Service: Orthopedics;  Laterality: Left;  Left Total Knee Revision Arthroplasty   TOTAL KNEE REVISION Right 12/09/2021   Procedure: CONVERT RIGHT UNI KNEE  ARTHROPLASTY TO TOTAL KNEE ARTHROPLASTY;  Surgeon: Mcarthur Rossetti, MD;  [facility];  Service: Orthopedics;  Laterality: Right;   TRIGGER FINGER RELEASE  06/2009   right 4th finger   Patient Active Problem List   Diagnosis Date Noted   Arthrofibrosis of right total knee arthroplasty ([place]) 02/09/2022   Status post revision of total knee, right 12/09/2021   Arthritis of right knee 12/08/2021   Chronic left-sided low back pain with left-sided sciatica 12/17/2019   Diabetes mellitus type 2 in obese ([place]) 12/13/2019   Chronic rhinitis 03/03/2019   Constipation 10/31/2016   Impaired glucose tolerance 10/31/2016   Stage III chronic kidney disease ([place]) 10/31/2016   Urge urinary incontinence 10/31/2016   Status post right partial knee replacement 10/02/2014   Anxiety and depression 11/06/2011   Hypertension 10/13/2010   Hyperlipidemia 10/13/2010   Osteoarthritis 10/13/2010   UNSPECIFIED IRON DEFICIENCY ANEMIA 04/11/2010   DIARRHEA 04/11/2010    REFERRING DIAG: R15.400 (ICD-10-CM) - Status post revision of total knee, right   THERAPY DIAG:  Acute pain of right knee  Difficulty in walking, not elsewhere classified  Stiffness of right knee, not elsewhere classified  Localized edema  Rationale for Evaluation and Treatment Rehabilitation  PERTINENT HISTORY:  Rt TKA 12/09/21  Rt knee surgery: uni knee arthroplasty injection 09/16/2020   hyperlipidemia, depression, HTN, osteoporosis  PRECAUTIONS:  none  SUBJECTIVE:                                                                                                                                                                                      SUBJECTIVE STATEMENT: Pt indicated 6-7/10 upon arrival today.  Reported she had done walking on treadmill (up to 50 mins last night) that might have   PAIN:  NPRS scale: 6-7/10 Pain location: Rt anterior knee  Pain description: sore, constant ache Aggravating factors:  bending, walking prolonged Relieving factors: extra strength tylenol ice/heat    OBJECTIVE: (objective measures completed at initial evaluation unless otherwise dated)  DIAGNOSTIC FINDINGS: 12/09/21 IMPRESSION: 1. Right total knee arthroplasty revision without acute postoperative complication.images following TKA    PATIENT SURVEYS:  02/10/2022: FOTO 53 (was 46, Goal 56)  01/09/22: FOTO intake:  46%   COGNITION: Overall cognitive status: WFL                  SENSATION: WFL:    EDEMA:  01/09/22: Circumferential: Rt:  50 centimeters                           Left: 46 centimeters MUSCLE LENGTH: 01/09/22: Hamstrings: Right 75 deg; Left 84 deg     POSTURE: rounded shoulders and forward head   PALPATION: 01/09/22: TTP: both sides of incision site on Rt knee    LOWER EXTREMITY ROM:    ROM A: active,  P: passive Right 01/09/22 Left 01/09/22 Rt 01/11/22 Right 01/20/22 Right 02/10/2022 Post-manipulation  Hip flexion         Knee flexion A: 76 P: 80 A: 94 A: 76 P: 84 A: 80 P: 86 Active 78  Knee extension A: -10 P: -8 A: -2 A: -8 P: -6 A (LAQ): -5 Active -2   (Blank rows = not tested)   LOWER EXTREMITY MMT:   MMT Right 01/09/22 Left 01/09/22  Hip flexion 4-/5 4-/5  Hip extension      Hip abduction 4/5 4/5  Hip adduction 4/5 4/5  Hip internal rotation      Hip external rotation      Knee flexion 3+/5 5/5  Knee extension 3+/5 5/5   (Blank rows = not tested)     FUNCTIONAL TESTS:  01/09/22: 5 time sit to stand: 17 seconds c UE support   GAIT: Distance walked: 20 feet Assistive device utilized: Single point cane Level of assistance: Modified independence Comments: forward  trunk flexion, antalgic gait pattern, increased knee flexion on Rt  TODAY'S TREATMENT  02/14/2022: Therex: Nustep Lvl 5 8 mins for ROM UE/LE (bike not available today) Seated AROM LAQ Rt 15 x for ROM Sit to stand to sit 20.5 inch table height no UE assist x 10  Leg press Double  leg 75 lbs x 15, single leg 31 lbs x 15 bilateral Standing Incline calf stretch 30 sec x 3 bilateral   Manual: Seated Rt knee flexion c distraction/IR mobilization c movement.  Seated contract /relax techniques into flexion Rt knee.   Vaso: Rt knee Medium Pressure 10 minutes 34*   02/10/2022 Recumbent bike Seat 5 for 5 minutes AAROM only Tailgate knee flexion AAROM 1 minutes X 2 Knee flexion AAROM (Lt pushes Rt into flexion) 10X 10 seconds Quadriceps sets 10X 5 seconds with heel prop  Functional Activities: Double Leg Press 75# full extension and stretch into flexion 10X Single Leg Press 25# full extension and stretch into flexion 10X  Manual overpressure into knee flexion 10X 10 seconds right knee with PT  Vaso Rt knee Medium Pressure 10 minutes 34*   02/08/2022 Recumbent bike Seat 5 for 5 minutes AAROM only T[place]gate knee flexion AAROM 3 minutes X 2 Knee flexion AAROM (Lt pushes Rt into flexion) 10X 10 seconds Quadriceps sets 2 sets of 10 for 5 seconds with heel prop    PATIENT EDUCATION:  Education details: HEP, POC Person educated: Patient Education method: Consulting civil engineer, Demonstration, Verbal cues, and Handouts Education comprehension: verbalized understanding, returned demonstration, and verbal cues required   HOME EXERCISE PROGRAM: Access Code: [email or web address] URL: [email or web address] Date: 02/08/2022 Prepared by: Vista Mink  Exercises - Sit to Stand  - 3 x daily - 7 x weekly - 2 sets - 10 reps - Seated Long Arc Quad  - 3 x daily - 7 x weekly - 2 sets - 10 reps - 5 seconds hold - Supine Heel Slide with Strap  - 3 x daily - 7 x weekly - 2 sets - 10 reps - Supine Straight Leg Raises  - 3 x daily - 7 x weekly - 2 sets - 10 reps - Supine Quad Set  - 3 x daily - 7 x weekly - 2 sets - 10 reps - 5 seconds hold - Supine Quadricep Sets  - 5 x daily - 7 x weekly - 2 sets - 10 reps - 5 second hold - Seated Knee Flexion AAROM  - 5 x daily - 7 x weekly - 1 sets -  1 reps - 3 minutes hold     ASSESSMENT:   CLINICAL IMPRESSION: Flexion and extension end range limitations c firm end feels noted.  Continued improvement in mobility will aid in functional movement patterns including gait.  Continued skilled PT services warranted at this time.    OBJECTIVE IMPAIRMENTS: Abnormal gait, decreased activity tolerance, decreased balance, decreased mobility, difficulty walking, decreased ROM, decreased strength, increased edema, impaired flexibility, and pain.    ACTIVITY LIMITATIONS: lifting, bending, sitting, standing, squatting, sleeping, stairs, and transfers   PARTICIPATION LIMITATIONS: cleaning, driving, and community activity   PERSONAL FACTORS: 3+ comorbidities: see above  are also affecting patient's functional outcome.    REHAB POTENTIAL: Good   CLINICAL DECISION MAKING: Stable/uncomplicated   EVALUATION COMPLEXITY: Low     GOALS: Goals reviewed with patient? Yes   SHORT TERM GOALS: (target date for Short term goals are 3 weeks 02/03/22)    1.  Patient will demonstrate independent use of home exercise program to maintain progress from in clinic treatments.  Goal status: On going: 02/10/22   2.  Pt will be able to perform 5 time sit to stand in </= 12 seconds c/s UE support.                         Goal status: On going 02/10/2022   LONG TERM GOALS: (target dates for all long term goals are 12 weeks  04/07/22 )   1. Patient will demonstrate/report pain at worst less than or equal to 2/10 to facilitate minimal limitation in daily activity secondary to pain symptoms.   Goal status: On Going 02/10/2022   2. Patient will demonstrate independent use of home exercise program to facilitate ability to maintain/progress functional gains from skilled physical therapy services.   Goal status: On Going 02/10/2022   3. Patient will demonstrate FOTO outcome > or = 56 % to indicate reduced disability due to condition.   Goal status: On Going  02/10/2022   4.  Patient will demonstrate Rt  LE MMT >/= 4+/5 throughout to faciltiate usual transfers, stairs, squatting at [facility] for daily life.  Goal status: On Going 02/10/2022   5.  Patient will be able to amb > 800 feet with LRAD with normalized gait pattern on community surfaces.    Goal status: On Going 02/10/2022   6.  Pt will be able to navigate up and down 5 stairs with single hand rail with step over step pattern.    Goal status: On Going 02/08/2022   7. Pt will improve her Rt knee flexion to >/= 100 degrees for improved functional mobility.              Goal status: On Going 02/10/2022    PLAN:   PT FREQUENCY: Daily for 2 weeks then likely 2-3X/week for 2 weeks   PT DURATION: 12 weeks   PLANNED INTERVENTIONS: Therapeutic exercises, Therapeutic activity, Neuro Muscular re-education, Balance training, Gait training, Patient/Family education, Joint mobilization, Stair training, DME instructions, Dry Needling, Electrical stimulation, Traction, Cryotherapy, vasopneumatic deviceMoist heat, Taping, Ultrasound, Ionotophoresis '4mg'$ /ml Dexamethasone, and Manual therapy.  All included unless contraindicated   PLAN FOR NEXT SESSION: Manual for mobility gains.  Progressive strengthening in functional movements [facility] and [facility].      Scot Jun, PT, DPT, OCS, ATC 02/14/22  8:37 AM

## 2022-02-15 ENCOUNTER — Ambulatory Visit (INDEPENDENT_AMBULATORY_CARE_PROVIDER_SITE_OTHER): Payer: Medicare Other | Admitting: Physical Therapy

## 2022-02-15 ENCOUNTER — Encounter: Payer: Self-pay | Admitting: Physical Therapy

## 2022-02-15 DIAGNOSIS — M25561 Pain in right knee: Secondary | ICD-10-CM

## 2022-02-15 DIAGNOSIS — M25661 Stiffness of right knee, not elsewhere classified: Secondary | ICD-10-CM

## 2022-02-15 DIAGNOSIS — R600 Localized edema: Secondary | ICD-10-CM

## 2022-02-15 DIAGNOSIS — R262 Difficulty in walking, not elsewhere classified: Secondary | ICD-10-CM | POA: Diagnosis not present

## 2022-02-15 NOTE — Therapy (Signed)
OUTPATIENT PHYSICAL THERAPY TREATMENT   Patient Name: Kelly [name] MRN: [id number] DOB:[birth year], 77 y.o., female Today's Date: 02/15/2022  PCP: Kelly Showers MD  REFERRING PROVIDER: Mcarthur Rossetti, MD    END OF SESSION:   PT End of Session - 02/15/22 0809     Visit Number 9    Number of Visits 28    Date for PT Re-Evaluation 04/07/22    Authorization Type Medicare/BCBS    Progress Note Due on Visit 18    PT Start Time 0803    PT Stop Time 0843    PT Time Calculation (min) 40 min    Activity Tolerance Patient tolerated treatment well;No increased pain    Behavior During Therapy WFL for tasks assessed/performed              Past Medical History:  Diagnosis Date   Arthritis    Cataract    Chronic kidney disease    Depression    Hyperlipidemia    Hypertension    Osteoporosis    Past Surgical History:  Procedure Laterality Date   ABDOMINAL HYSTERECTOMY     BREAST REDUCTION SURGERY     and lift   BREAST REDUCTION SURGERY Bilateral    CARPAL TUNNEL RELEASE  06/2009   right   CATARACT EXTRACTION     CHOLECYSTECTOMY     FACIAL COSMETIC SURGERY  03/2008   KNEE ARTHROSCOPY  2007   left & right   KNEE CLOSED REDUCTION Right 02/09/2022   Procedure: CLOSED MANIPULATION RIGHT TOTAL KNEE ARTHROPLASTY;  Surgeon: Kelly Rossetti, MD;  Location: [place];  Service: Orthopedics;  Laterality: Right;   LIPOSUCTION EXTREMITIES     thighs   PARTIAL KNEE ARTHROPLASTY Right 10/02/2014   Procedure: RIGHT KNEE MEDIAL UNICOMPARTMENTAL ARTHROPLASTY;  Surgeon: Kelly Rossetti, MD;  Location: [facility];  Service: Orthopedics;  Laterality: Right;   REDUCTION MAMMAPLASTY     bilateral   TOTAL KNEE REVISION  10/12/2011   Procedure: TOTAL KNEE REVISION;  Surgeon: Kelly Rossetti, MD;  Location: [facility];  Service: Orthopedics;  Laterality: Left;  Left Total Knee Revision Arthroplasty   TOTAL KNEE REVISION Right 12/09/2021   Procedure: CONVERT RIGHT UNI  KNEE ARTHROPLASTY TO TOTAL KNEE ARTHROPLASTY;  Surgeon: Kelly Rossetti, MD;  Location: [facility];  Service: Orthopedics;  Laterality: Right;   TRIGGER FINGER RELEASE  06/2009   right 4th finger   Patient Active Problem List   Diagnosis Date Noted   Arthrofibrosis of right total knee arthroplasty ([place]) 02/09/2022   Status post revision of total knee, right 12/09/2021   Arthritis of right knee 12/08/2021   Chronic left-sided low back pain with left-sided sciatica 12/17/2019   Diabetes mellitus type 2 in obese ([place]) 12/13/2019   Chronic rhinitis 03/03/2019   Constipation 10/31/2016   Impaired glucose tolerance 10/31/2016   Stage III chronic kidney disease ([place]) 10/31/2016   Urge urinary incontinence 10/31/2016   Status post right partial knee replacement 10/02/2014   Anxiety and depression 11/06/2011   Hypertension 10/13/2010   Hyperlipidemia 10/13/2010   Osteoarthritis 10/13/2010   UNSPECIFIED IRON DEFICIENCY ANEMIA 04/11/2010   DIARRHEA 04/11/2010    REFERRING DIAG: W40.973 (ICD-10-CM) - Status post revision of total knee, right   THERAPY DIAG:  Acute pain of right knee  Difficulty in walking, not elsewhere classified  Stiffness of right knee, not elsewhere classified  Localized edema  Rationale for Evaluation and Treatment Rehabilitation  PERTINENT HISTORY:  Rt TKA 12/09/21  Rt knee surgery: uni knee arthroplasty injection 09/16/2020   hyperlipidemia, depression, HTN, osteoporosis  PRECAUTIONS:  none  SUBJECTIVE:                                                                                                                                                                                      SUBJECTIVE STATEMENT:   A tree came down and Kelly [name] is coming to my house to fix it; I'm in pain because I've been working hard trying to bend my knee. I guess I have a lot of scar tissue. I'm determined to get this thing moving.   PAIN:  NPRS scale: 7/10 Pain  location: Rt knee  Pain description: throbbing  Aggravating factors: just trying to work knee  Relieving factors: heat, massage, ice    OBJECTIVE: (objective measures completed at initial evaluation unless otherwise dated)  DIAGNOSTIC FINDINGS: 12/09/21 IMPRESSION: 1. Right total knee arthroplasty revision without acute postoperative complication.images following TKA    PATIENT SURVEYS:  02/10/2022: FOTO 53 (was 46, Goal 56)  01/09/22: FOTO intake:  46%   COGNITION: Overall cognitive status: WFL                  SENSATION: WFL:    EDEMA:  01/09/22: Circumferential: Rt:  50 centimeters                           Left: 46 centimeters MUSCLE LENGTH: 01/09/22: Hamstrings: Right 75 deg; Left 84 deg     POSTURE: rounded shoulders and forward head   PALPATION: 01/09/22: TTP: both sides of incision site on Rt knee    LOWER EXTREMITY ROM:    ROM A: active,  P: passive Right 01/09/22 Left 01/09/22 Rt 01/11/22 Right 01/20/22 Right 02/10/2022 Post-manipulation R 02/15/22  Hip flexion          Knee flexion A: 76 P: 80 A: 94 A: 76 P: 84 A: 80 P: 86 Active 78 Active seated 65*; supine 70*  Knee extension A: -10 P: -8 A: -2 A: -8 P: -6 A (LAQ): -5 Active -2 Active seated 22*; supine 11*   (Blank rows = not tested)   LOWER EXTREMITY MMT:   MMT Right 01/09/22 Left 01/09/22  Hip flexion 4-/5 4-/5  Hip extension      Hip abduction 4/5 4/5  Hip adduction 4/5 4/5  Hip internal rotation      Hip external rotation      Knee flexion 3+/5 5/5  Knee extension 3+/5 5/5   (Blank rows = not tested)     FUNCTIONAL TESTS:  01/09/22: 5 time sit to stand: 17  seconds c UE support   GAIT: Distance walked: 20 feet Assistive device utilized: Single point cane Level of assistance: Modified independence Comments: forward  trunk flexion, antalgic gait pattern, increased knee flexion on Rt     TODAY'S TREATMENT   02/15/22  TherEx  Scifit bike for knee ROM seat 12 x6  minutes Hip flexor and quad mm stretch 2x60 seconds Bridges x10 able to tolerate about 1/2 height     Manual  Patella mobs all directions Knee extension OP in supine x10 to tolerance  Tennis ball STM R quad- multiple spasms noted  MET for quads and hams sitting at edge of mat table x10 each with 3 second holds   02/14/2022: Therex: Nustep Lvl 5 8 mins for ROM UE/LE (bike not available today) Seated AROM LAQ Rt 15 x for ROM Sit to stand to sit 20.5 inch table height no UE assist x 10  Leg press Double leg 75 lbs x 15, single leg 31 lbs x 15 bilateral Standing Incline calf stretch 30 sec x 3 bilateral   Manual: Seated Rt knee flexion c distraction/IR mobilization c movement.  Seated contract /relax techniques into flexion Rt knee.   Vaso: Rt knee Medium Pressure 10 minutes 34*   02/10/2022 Recumbent bike Seat 5 for 5 minutes AAROM only Tailgate knee flexion AAROM 1 minutes X 2 Knee flexion AAROM (Lt pushes Rt into flexion) 10X 10 seconds Quadriceps sets 10X 5 seconds with heel prop  Functional Activities: Double Leg Press 75# full extension and stretch into flexion 10X Single Leg Press 25# full extension and stretch into flexion 10X  Manual overpressure into knee flexion 10X 10 seconds right knee with PT  Vaso Rt knee Medium Pressure 10 minutes 34*   02/08/2022 Recumbent bike Seat 5 for 5 minutes AAROM only Tailgate knee flexion AAROM 3 minutes X 2 Knee flexion AAROM (Lt pushes Rt into flexion) 10X 10 seconds Quadriceps sets 2 sets of 10 for 5 seconds with heel prop    PATIENT EDUCATION:  Education details: exercise form/purpose. Do not overdo exercise, may cause a set back  Person educated: Patient Education method: Explanation, Demonstration, Verbal cues, and Handouts Education comprehension: verbalized understanding, returned demonstration, and verbal cues required   HOME EXERCISE PROGRAM: Access Code: [email or web address] URL: [email or web address] Date:  02/08/2022 Prepared by: Vista Mink  Exercises - Sit to Stand  - 3 x daily - 7 x weekly - 2 sets - 10 reps - Seated Long Arc Quad  - 3 x daily - 7 x weekly - 2 sets - 10 reps - 5 seconds hold - Supine Heel Slide with Strap  - 3 x daily - 7 x weekly - 2 sets - 10 reps - Supine Straight Leg Raises  - 3 x daily - 7 x weekly - 2 sets - 10 reps - Supine Quad Set  - 3 x daily - 7 x weekly - 2 sets - 10 reps - 5 seconds hold - Supine Quadricep Sets  - 5 x daily - 7 x weekly - 2 sets - 10 reps - 5 second hold - Seated Knee Flexion AAROM  - 5 x daily - 7 x weekly - 1 sets - 1 reps - 3 minutes hold     ASSESSMENT:   CLINICAL IMPRESSION:  Continued working on ROM as indicated, still has quite a bit of limitation in ROM. Very determined to improve and working hard at home. Will continue max efforts in context of recent R knee manipulation.  Sounds like she may still be overdoing it at home, educated that we need to find a balance between doing just enough and too much activity.    OBJECTIVE IMPAIRMENTS: Abnormal gait, decreased activity tolerance, decreased balance, decreased mobility, difficulty walking, decreased ROM, decreased strength, increased edema, impaired flexibility, and pain.    ACTIVITY LIMITATIONS: lifting, bending, sitting, standing, squatting, sleeping, stairs, and transfers   PARTICIPATION LIMITATIONS: cleaning, driving, and community activity   PERSONAL FACTORS: 3+ comorbidities: see above  are also affecting patient's functional outcome.    REHAB POTENTIAL: Good   CLINICAL DECISION MAKING: Stable/uncomplicated   EVALUATION COMPLEXITY: Low     GOALS: Goals reviewed with patient? Yes   SHORT TERM GOALS: (target date for Short term goals are 3 weeks 02/03/22)    1.  Patient will demonstrate independent use of home exercise program to maintain progress from in clinic treatments.   Goal status: On going: 02/10/22   2.  Pt will be able to perform 5 time sit to stand in  </= 12 seconds c/s UE support.                         Goal status: On going 02/10/2022   LONG TERM GOALS: (target dates for all long term goals are 12 weeks  04/07/22 )   1. Patient will demonstrate/report pain at worst less than or equal to 2/10 to facilitate minimal limitation in daily activity secondary to pain symptoms.   Goal status: On Going 02/10/2022   2. Patient will demonstrate independent use of home exercise program to facilitate ability to maintain/progress functional gains from skilled physical therapy services.   Goal status: On Going 02/10/2022   3. Patient will demonstrate FOTO outcome > or = 56 % to indicate reduced disability due to condition.   Goal status: On Going 02/10/2022   4.  Patient will demonstrate Rt  LE MMT >/= 4+/5 throughout to faciltiate usual transfers, stairs, squatting at [facility] for daily life.  Goal status: On Going 02/10/2022   5.  Patient will be able to amb > 800 feet with LRAD with normalized gait pattern on community surfaces.    Goal status: On Going 02/10/2022   6.  Pt will be able to navigate up and down 5 stairs with single hand rail with step over step pattern.    Goal status: On Going 02/08/2022   7. Pt will improve her Rt knee flexion to >/= 100 degrees for improved functional mobility.              Goal status: On Going 02/10/2022    PLAN:   PT FREQUENCY: Daily for 2 weeks then likely 2-3X/week for 2 weeks   PT DURATION: 12 weeks   PLANNED INTERVENTIONS: Therapeutic exercises, Therapeutic activity, Neuro Muscular re-education, Balance training, Gait training, Patient/Family education, Joint mobilization, Stair training, DME instructions, Dry Needling, Electrical stimulation, Traction, Cryotherapy, vasopneumatic deviceMoist heat, Taping, Ultrasound, Ionotophoresis 58m/ml Dexamethasone, and Manual therapy.  All included unless contraindicated   PLAN FOR NEXT SESSION: Manual for mobility gains.  Progressive strengthening in  functional movements OKC and CKC.      KDeniece ReePT DPT PN2

## 2022-02-16 ENCOUNTER — Encounter: Payer: Self-pay | Admitting: Rehabilitative and Restorative Service Providers"

## 2022-02-16 ENCOUNTER — Ambulatory Visit (INDEPENDENT_AMBULATORY_CARE_PROVIDER_SITE_OTHER): Payer: Medicare Other | Admitting: Rehabilitative and Restorative Service Providers"

## 2022-02-16 DIAGNOSIS — M25561 Pain in right knee: Secondary | ICD-10-CM

## 2022-02-16 DIAGNOSIS — R600 Localized edema: Secondary | ICD-10-CM

## 2022-02-16 DIAGNOSIS — R262 Difficulty in walking, not elsewhere classified: Secondary | ICD-10-CM

## 2022-02-16 DIAGNOSIS — M25661 Stiffness of right knee, not elsewhere classified: Secondary | ICD-10-CM

## 2022-02-16 NOTE — Therapy (Signed)
OUTPATIENT PHYSICAL THERAPY TREATMENT   Patient Name: Kelly [name] MRN: [id number] DOB:1946/01/24, 77 y.o., female Today's Date: 02/16/2022  PCP: Kelly Showers MD  REFERRING PROVIDER: Mcarthur Rossetti, MD    END OF SESSION:   PT End of Session - 02/16/22 1016     Visit Number 10    Number of Visits 28    Date for PT Re-Evaluation 04/07/22    Authorization Type Medicare/BCBS    Progress Note Due on Visit 18    PT Start Time 1016    PT Stop Time 1107    PT Time Calculation (min) 51 min    Activity Tolerance Patient tolerated treatment well;No increased pain;Patient limited by pain    Behavior During Therapy [facility] for tasks assessed/performed               Past Medical History:  Diagnosis Date   Arthritis    Cataract    Chronic kidney disease    Depression    Hyperlipidemia    Hypertension    Osteoporosis    Past Surgical History:  Procedure Laterality Date   ABDOMINAL HYSTERECTOMY     BREAST REDUCTION SURGERY     and lift   BREAST REDUCTION SURGERY Bilateral    CARPAL TUNNEL RELEASE  06/2009   right   CATARACT EXTRACTION     CHOLECYSTECTOMY     FACIAL COSMETIC SURGERY  03/2008   KNEE ARTHROSCOPY  2007   left & right   KNEE CLOSED REDUCTION Right 02/09/2022   Procedure: CLOSED MANIPULATION RIGHT TOTAL KNEE ARTHROPLASTY;  Surgeon: Kelly Rossetti, MD;  Location: [place];  Service: Orthopedics;  Laterality: Right;   LIPOSUCTION EXTREMITIES     thighs   PARTIAL KNEE ARTHROPLASTY Right 10/02/2014   Procedure: RIGHT KNEE MEDIAL UNICOMPARTMENTAL ARTHROPLASTY;  Surgeon: Kelly Rossetti, MD;  Location: [facility];  Service: Orthopedics;  Laterality: Right;   REDUCTION MAMMAPLASTY     bilateral   TOTAL KNEE REVISION  10/12/2011   Procedure: TOTAL KNEE REVISION;  Surgeon: Kelly Rossetti, MD;  Location: [facility];  Service: Orthopedics;  Laterality: Left;  Left Total Knee Revision Arthroplasty   TOTAL KNEE REVISION Right 12/09/2021    Procedure: CONVERT RIGHT UNI KNEE ARTHROPLASTY TO TOTAL KNEE ARTHROPLASTY;  Surgeon: Kelly Rossetti, MD;  Location: [facility];  Service: Orthopedics;  Laterality: Right;   TRIGGER FINGER RELEASE  06/2009   right 4th finger   Patient Active Problem List   Diagnosis Date Noted   Arthrofibrosis of right total knee arthroplasty ([place]) 02/09/2022   Status post revision of total knee, right 12/09/2021   Arthritis of right knee 12/08/2021   Chronic left-sided low back pain with left-sided sciatica 12/17/2019   Diabetes mellitus type 2 in obese ([place]) 12/13/2019   Chronic rhinitis 03/03/2019   Constipation 10/31/2016   Impaired glucose tolerance 10/31/2016   Stage III chronic kidney disease ([place]) 10/31/2016   Urge urinary incontinence 10/31/2016   Status post right partial knee replacement 10/02/2014   Anxiety and depression 11/06/2011   Hypertension 10/13/2010   Hyperlipidemia 10/13/2010   Osteoarthritis 10/13/2010   UNSPECIFIED IRON DEFICIENCY ANEMIA 04/11/2010   DIARRHEA 04/11/2010    REFERRING DIAG: X77.414 (ICD-10-CM) - Status post revision of total knee, right   THERAPY DIAG:  Acute pain of right knee  Difficulty in walking, not elsewhere classified  Stiffness of right knee, not elsewhere classified  Localized edema  Rationale for Evaluation and Treatment Rehabilitation  PERTINENT HISTORY:  Rt  TKA 12/09/21            Rt knee surgery: uni knee arthroplasty injection 09/16/2020   hyperlipidemia, depression, HTN, osteoporosis  PRECAUTIONS:  none  SUBJECTIVE:                                                                                                                                                                                      SUBJECTIVE STATEMENT:   Kelly [name] is doing a good job with her HEP compliance.  Emphasis remains flexion AROM with her HEP.  PAIN:  NPRS scale: 7/10 Pain location: Rt knee  Pain description: throbbing  Aggravating factors: just trying  to work knee  Relieving factors: heat, massage, ice    OBJECTIVE: (objective measures completed at initial evaluation unless otherwise dated)  DIAGNOSTIC FINDINGS: 12/09/21 IMPRESSION: 1. Right total knee arthroplasty revision without acute postoperative complication.images following TKA    PATIENT SURVEYS:  02/10/2022: FOTO 53 (was 46, Goal 56)  01/09/22: FOTO intake:  46%   COGNITION: Overall cognitive status: WFL                  SENSATION: WFL:    EDEMA:  01/09/22: Circumferential: Rt:  50 centimeters                           Left: 46 centimeters MUSCLE LENGTH: 01/09/22: Hamstrings: Right 75 deg; Left 84 deg     POSTURE: rounded shoulders and forward head   PALPATION: 01/09/22: TTP: both sides of incision site on Rt knee    LOWER EXTREMITY ROM:    ROM A: active,  P: passive Right 01/09/22 Left 01/09/22 Rt 01/11/22 Right 01/20/22 Right 02/10/2022 Post-manipulation R 02/15/22 Right 02/16/2022  Hip flexion           Knee flexion A: 76 P: 80 A: 94 A: 76 P: 84 A: 80 P: 86 Active 78 Active seated 65*; supine 70* Passive supine 80  Knee extension A: -10 P: -8 A: -2 A: -8 P: -6 A (LAQ): -5 Active -2 Active seated 22*; supine 11* Active supine 0 degrees   (Blank rows = not tested)   LOWER EXTREMITY MMT:   MMT Right 01/09/22 Left 01/09/22  Hip flexion 4-/5 4-/5  Hip extension      Hip abduction 4/5 4/5  Hip adduction 4/5 4/5  Hip internal rotation      Hip external rotation      Knee flexion 3+/5 5/5  Knee extension 3+/5 5/5   (Blank rows = not tested)     FUNCTIONAL TESTS:  01/09/22: 5 time sit to stand: 17 seconds c UE  support   GAIT: Distance walked: 20 feet Assistive device utilized: Single point cane Level of assistance: Modified independence Comments: forward  trunk flexion, antalgic gait pattern, increased knee flexion on Rt     TODAY'S TREATMENT  02/16/2022 Tailgate knee flexion 1 minute Knee flexion AAROM (Left pushes Right into flexion)  10X 10 seconds with PT overpressure Quad sets with Right heel prop 10X 5 seconds with PT overpressure  Functional Activities (sit to stand, stairs): Double Leg Press 75# emphasis on full AROM 20X Single Leg Press 31# emphasis on full AROM 20X Step-stretch with 8 inch step 5X 5 seconds  Manual: PT overpressure into flexion and extension with AAROM flexion and quadriceps sets  Vaso Right knee 10 minutes Medium 34*   02/15/22  TherEx  Scifit bike for knee ROM seat 12 x6 minutes Hip flexor and quad mm stretch 2x60 seconds Bridges x10 able to tolerate about 1/2 height     Manual  Patella mobs all directions Knee extension OP in supine x10 to tolerance  Tennis ball STM R quad- multiple spasms noted  MET for quads and hams sitting at edge of mat table x10 each with 3 second holds    02/14/2022: Therex: Nustep Lvl 5 8 mins for ROM UE/LE (bike not available today) Seated AROM LAQ Rt 15 x for ROM Sit to stand to sit 20.5 inch table height no UE assist x 10  Leg press Double leg 75 lbs x 15, single leg 31 lbs x 15 bilateral Standing Incline calf stretch 30 sec x 3 bilateral   Manual: Seated Rt knee flexion c distraction/IR mobilization c movement.  Seated contract /relax techniques into flexion Rt knee.   Vaso: Rt knee Medium Pressure 10 minutes 34*   PATIENT EDUCATION:  Education details: exercise form/purpose. Do not overdo exercise, may cause a set back  Person educated: Patient Education method: Explanation, Demonstration, Verbal cues, and Handouts Education comprehension: verbalized understanding, returned demonstration, and verbal cues required   HOME EXERCISE PROGRAM: Access Code: [email or web address] URL: [email or web address] Date: 02/08/2022 Prepared by: Vista Mink  Exercises - Sit to Stand  - 3 x daily - 7 x weekly - 2 sets - 10 reps - Seated Long Arc Quad  - 3 x daily - 7 x weekly - 2 sets - 10 reps - 5 seconds hold - Supine Heel Slide with Strap  - 3  x daily - 7 x weekly - 2 sets - 10 reps - Supine Straight Leg Raises  - 3 x daily - 7 x weekly - 2 sets - 10 reps - Supine Quad Set  - 3 x daily - 7 x weekly - 2 sets - 10 reps - 5 seconds hold - Supine Quadricep Sets  - 5 x daily - 7 x weekly - 2 sets - 10 reps - 5 second hold - Seated Knee Flexion AAROM  - 5 x daily - 7 x weekly - 1 sets - 1 reps - 3 minutes hold     ASSESSMENT:   CLINICAL IMPRESSION: Nastassja now has full knee extension active range of motion and up to 80 degrees passively with her knee flexion.  Active knee flexion is around 75 degrees and this is a focus with her current program along with quadriceps strengthening and edema control.  Continue current emphasis on knee flexion active range of motion, quadriceps strength and edema control before her follow-up with Dr. Ninfa Linden.   OBJECTIVE IMPAIRMENTS: Abnormal gait, decreased activity tolerance, decreased balance, decreased mobility, difficulty  walking, decreased ROM, decreased strength, increased edema, impaired flexibility, and pain.    ACTIVITY LIMITATIONS: lifting, bending, sitting, standing, squatting, sleeping, stairs, and transfers   PARTICIPATION LIMITATIONS: cleaning, driving, and community activity   PERSONAL FACTORS: 3+ comorbidities: see above  are also affecting patient's functional outcome.    REHAB POTENTIAL: Good   CLINICAL DECISION MAKING: Stable/uncomplicated   EVALUATION COMPLEXITY: Low     GOALS: Goals reviewed with patient? Yes   SHORT TERM GOALS: (target date for Short term goals are 3 weeks 02/03/22)    1.  Patient will demonstrate independent use of home exercise program to maintain progress from in clinic treatments.   Goal status: Met 02/16/2022   2.  Pt will be able to perform 5 time sit to stand in </= 12 seconds c/s UE support.                         Goal status: On going 02/16/2022   LONG TERM GOALS: (target dates for all long term goals are 12 weeks  04/07/22 )   1. Patient will  demonstrate/report pain at worst less than or equal to 2/10 to facilitate minimal limitation in daily activity secondary to pain symptoms.   Goal status: On Going 02/16/2022   2. Patient will demonstrate independent use of home exercise program to facilitate ability to maintain/progress functional gains from skilled physical therapy services.   Goal status: On Going 02/16/2022   3. Patient will demonstrate FOTO outcome > or = 56 % to indicate reduced disability due to condition.   Goal status: On Going 02/10/2022   4.  Patient will demonstrate Rt  LE MMT >/= 4+/5 throughout to faciltiate usual transfers, stairs, squatting at [facility] for daily life.  Goal status: On Going 02/10/2022   5.  Patient will be able to amb > 800 feet with LRAD with normalized gait pattern on community surfaces.    Goal status: On Going 02/16/2022   6.  Pt will be able to navigate up and down 5 stairs with single hand rail with step over step pattern.    Goal status: On Going 02/16/2022   7. Pt will improve her Rt knee flexion to >/= 100 degrees for improved functional mobility.              Goal status: On Going 02/16/2022    PLAN:   PT FREQUENCY: Daily for 2 weeks then likely 2-3X/week for 2 weeks   PT DURATION: 12 weeks   PLANNED INTERVENTIONS: Therapeutic exercises, Therapeutic activity, Neuro Muscular re-education, Balance training, Gait training, Patient/Family education, Joint mobilization, Stair training, DME instructions, Dry Needling, Electrical stimulation, Traction, Cryotherapy, vasopneumatic deviceMoist heat, Taping, Ultrasound, Ionotophoresis 74m/ml Dexamethasone, and Manual therapy.  All included unless contraindicated   PLAN FOR NEXT SESSION: Manual for mobility gains.  Progressive quadriceps strengthening in functional movements OKC and CKC.      RFarley LyPT, MPT

## 2022-02-17 ENCOUNTER — Encounter: Payer: Self-pay | Admitting: Rehabilitative and Restorative Service Providers"

## 2022-02-17 ENCOUNTER — Ambulatory Visit (INDEPENDENT_AMBULATORY_CARE_PROVIDER_SITE_OTHER): Payer: Medicare Other | Admitting: Rehabilitative and Restorative Service Providers"

## 2022-02-17 DIAGNOSIS — M25561 Pain in right knee: Secondary | ICD-10-CM

## 2022-02-17 DIAGNOSIS — R262 Difficulty in walking, not elsewhere classified: Secondary | ICD-10-CM | POA: Diagnosis not present

## 2022-02-17 DIAGNOSIS — M25661 Stiffness of right knee, not elsewhere classified: Secondary | ICD-10-CM

## 2022-02-17 DIAGNOSIS — R600 Localized edema: Secondary | ICD-10-CM | POA: Diagnosis not present

## 2022-02-17 NOTE — Therapy (Signed)
OUTPATIENT PHYSICAL THERAPY TREATMENT/PROGRESS NOTE   Patient Name: Kelly [name] MRN: [id number] DOB:[birth year], 77 y.o., female Today's Date: 02/17/2022  PCP: Elby Showers MD  REFERRING PROVIDER: Mcarthur Rossetti, MD    END OF SESSION:   PT End of Session - 02/17/22 0910     Visit Number 11    Number of Visits 28    Date for PT Re-Evaluation 04/07/22    Authorization Type Medicare/BCBS    Progress Note Due on Visit 18    PT Start Time 3419    PT Stop Time 0937    PT Time Calculation (min) 48 min    Activity Tolerance Patient tolerated treatment well;No increased pain;Patient limited by pain    Behavior During Therapy [facility] for tasks assessed/performed            Progress Note Reporting Period 01/09/2022 to 02/17/2022  See note below for Objective Data and Assessment of Progress/Goals.    Past Medical History:  Diagnosis Date   Arthritis    Cataract    Chronic kidney disease    Depression    Hyperlipidemia    Hypertension    Osteoporosis    Past Surgical History:  Procedure Laterality Date   ABDOMINAL HYSTERECTOMY     BREAST REDUCTION SURGERY     and lift   BREAST REDUCTION SURGERY Bilateral    CARPAL TUNNEL RELEASE  06/2009   right   CATARACT EXTRACTION     CHOLECYSTECTOMY     FACIAL COSMETIC SURGERY  03/2008   KNEE ARTHROSCOPY  2007   left & right   KNEE CLOSED REDUCTION Right 02/09/2022   Procedure: CLOSED MANIPULATION RIGHT TOTAL KNEE ARTHROPLASTY;  Surgeon: Mcarthur Rossetti, MD;  Location: [place];  Service: Orthopedics;  Laterality: Right;   LIPOSUCTION EXTREMITIES     thighs   PARTIAL KNEE ARTHROPLASTY Right 10/02/2014   Procedure: RIGHT KNEE MEDIAL UNICOMPARTMENTAL ARTHROPLASTY;  Surgeon: Mcarthur Rossetti, MD;  Location: [facility];  Service: Orthopedics;  Laterality: Right;   REDUCTION MAMMAPLASTY     bilateral   TOTAL KNEE REVISION  10/12/2011   Procedure: TOTAL KNEE REVISION;  Surgeon: Mcarthur Rossetti, MD;   Location: [facility];  Service: Orthopedics;  Laterality: Left;  Left Total Knee Revision Arthroplasty   TOTAL KNEE REVISION Right 12/09/2021   Procedure: CONVERT RIGHT UNI KNEE ARTHROPLASTY TO TOTAL KNEE ARTHROPLASTY;  Surgeon: Mcarthur Rossetti, MD;  Location[facility];  Service: Orthopedics;  Laterality: Right;   TRIGGER FINGER RELEASE  06/2009   right 4th finger   Patient Active Problem List   Diagnosis Date Noted   Arthrofibrosis of right total knee arthroplasty ([place]) 02/09/2022   Status post revision of total knee, right 12/09/2021   Arthritis of right knee 12/08/2021   Chronic left-sided low back pain with left-sided sciatica 12/17/2019   Diabetes mellitus type 2 in obese ([place]) 12/13/2019   Chronic rhinitis 03/03/2019   Constipation 10/31/2016   Impaired glucose tolerance 10/31/2016   Stage III chronic kidney disease ([place]) 10/31/2016   Urge urinary incontinence 10/31/2016   Status post right partial knee replacement 10/02/2014   Anxiety and depression 11/06/2011   Hypertension 10/13/2010   Hyperlipidemia 10/13/2010   Osteoarthritis 10/13/2010   UNSPECIFIED IRON DEFICIENCY ANEMIA 04/11/2010   DIARRHEA 04/11/2010    REFERRING DIAG: Q22.297 (ICD-10-CM) - Status post revision of total knee, right   THERAPY DIAG:  Acute pain of right knee  Difficulty in walking, not elsewhere classified  Stiffness of right  knee, not elsewhere classified  Localized edema  Rationale for Evaluation and Treatment Rehabilitation  PERTINENT HISTORY:  Rt TKA 12/09/21            Rt knee surgery: uni knee arthroplasty injection 09/16/2020   hyperlipidemia, depression, HTN, osteoporosis  PRECAUTIONS:  none  SUBJECTIVE:                                                                                                                                                                                      SUBJECTIVE STATEMENT:   Kelly [name] continues to do a good job with her HEP compliance.  Emphasis  remains flexion AROM with her HEP.  She is feeling the cold weather this morning.  PAIN:  NPRS scale: 7/10 Pain location: Rt knee  Pain description: throbbing  Aggravating factors: just trying to work knee  Relieving factors: heat, massage, ice    OBJECTIVE: (objective measures completed at initial evaluation unless otherwise dated)  DIAGNOSTIC FINDINGS: 12/09/21 IMPRESSION: 1. Right total knee arthroplasty revision without acute postoperative complication.images following TKA    PATIENT SURVEYS:  02/10/2022: FOTO 53 (was 46, Goal 56)  01/09/22: FOTO intake:  46%   COGNITION: Overall cognitive status: WFL                  SENSATION: WFL:    EDEMA:  01/09/22: Circumferential: Rt:  50 centimeters                           Left: 46 centimeters MUSCLE LENGTH: 01/09/22: Hamstrings: Right 75 deg; Left 84 deg     POSTURE: rounded shoulders and forward head   PALPATION: 01/09/22: TTP: both sides of incision site on Rt knee    LOWER EXTREMITY ROM:    ROM A: active,  P: passive Right 01/09/22 Left 01/09/22 Rt 01/11/22 Right 01/20/22 Right 02/10/2022 Post-manipulation R 02/15/22 Right 02/16/2022  Hip flexion           Knee flexion A: 76 P: 80 A: 94 A: 76 P: 84 A: 80 P: 86 Active 78 Active seated 65*; supine 70* Passive supine 80  Knee extension A: -10 P: -8 A: -2 A: -8 P: -6 A (LAQ): -5 Active -2 Active seated 22*; supine 11* Active supine 0 degrees   (Blank rows = not tested)   LOWER EXTREMITY MMT:   MMT Right 01/09/22 Left 01/09/22  Hip flexion 4-/5 4-/5  Hip extension      Hip abduction 4/5 4/5  Hip adduction 4/5 4/5  Hip internal rotation      Hip external rotation      Knee flexion 3+/5 5/5  Knee extension 3+/5 5/5   (Blank rows = not tested)     FUNCTIONAL TESTS:  01/09/22: 5 time sit to stand: 17 seconds c UE support   GAIT: Distance walked: 20 feet Assistive device utilized: Single point cane Level of assistance: Modified  independence Comments: forward  trunk flexion, antalgic gait pattern, increased knee flexion on Rt     TODAY'S TREATMENT  02/17/2022 Tailgate knee flexion 1 minute Knee flexion AAROM (Left pushes Right into flexion) 10X 10 seconds with PT overpressure Recumbent bike Seat 5 for 5 minutes AAROM for knee flexion  Functional Activities (sit to stand, stairs): Double Leg Press 75# emphasis on full AROM 10X Single Leg Press 25# emphasis on full AROM 15X Step-stretch with 8 inch step 5X 5 seconds  Manual: PT overpressure into flexion and extension with AAROM flexion   Vaso Right knee 10 minutes Medium 34*   02/16/2022 Tailgate knee flexion 1 minute Knee flexion AAROM (Left pushes Right into flexion) 10X 10 seconds with PT overpressure Quad sets with Right heel prop 10X 5 seconds with PT overpressure  Functional Activities (sit to stand, stairs): Double Leg Press 75# emphasis on full AROM 20X Single Leg Press 31# emphasis on full AROM 20X Step-stretch with 8 inch step 5X 5 seconds  Manual: PT overpressure into flexion and extension with AAROM flexion and quadriceps sets  Vaso Right knee 10 minutes Medium 34*   02/15/22  TherEx  Scifit bike for knee ROM seat 12 x6 minutes Hip flexor and quad mm stretch 2x60 seconds Bridges x10 able to tolerate about 1/2 height     Manual  Patella mobs all directions Knee extension OP in supine x10 to tolerance  Tennis ball STM R quad- multiple spasms noted  MET for quads and hams sitting at edge of mat table x10 each with 3 second holds    PATIENT EDUCATION:  Education details: exercise form/purpose. Do not overdo exercise, may cause a set back  Person educated: Patient Education method: Explanation, Demonstration, Verbal cues, and Handouts Education comprehension: verbalized understanding, returned demonstration, and verbal cues required   HOME EXERCISE PROGRAM: Access Code: [email or web address] URL: [email or web address] Date:  02/08/2022 Prepared by: Vista Mink  Exercises - Sit to Stand  - 3 x daily - 7 x weekly - 2 sets - 10 reps - Seated Long Arc Quad  - 3 x daily - 7 x weekly - 2 sets - 10 reps - 5 seconds hold - Supine Heel Slide with Strap  - 3 x daily - 7 x weekly - 2 sets - 10 reps - Supine Straight Leg Raises  - 3 x daily - 7 x weekly - 2 sets - 10 reps - Supine Quad Set  - 3 x daily - 7 x weekly - 2 sets - 10 reps - 5 seconds hold - Supine Quadricep Sets  - 5 x daily - 7 x weekly - 2 sets - 10 reps - 5 second hold - Seated Knee Flexion AAROM  - 5 x daily - 7 x weekly - 1 sets - 1 reps - 3 minutes hold     ASSESSMENT:   CLINICAL IMPRESSION: Jolicia has full knee extension active range of motion and up to 80 degrees passively with her knee flexion.  Active knee flexion is around 75 degrees and remains the focus with her current program along with quadriceps strengthening and edema control.  Her emphasis will remain knee flexion active range of motion, quadriceps strength and edema control.  She  follows-up with Dr. Ninfa Linden on Monday.   OBJECTIVE IMPAIRMENTS: Abnormal gait, decreased activity tolerance, decreased balance, decreased mobility, difficulty walking, decreased ROM, decreased strength, increased edema, impaired flexibility, and pain.    ACTIVITY LIMITATIONS: lifting, bending, sitting, standing, squatting, sleeping, stairs, and transfers   PARTICIPATION LIMITATIONS: cleaning, driving, and community activity   PERSONAL FACTORS: 3+ comorbidities: see above  are also affecting patient's functional outcome.    REHAB POTENTIAL: Good   CLINICAL DECISION MAKING: Stable/uncomplicated   EVALUATION COMPLEXITY: Low     GOALS: Goals reviewed with patient? Yes   SHORT TERM GOALS: (target date for Short term goals are 3 weeks 02/03/22)    1.  Patient will demonstrate independent use of home exercise program to maintain progress from in clinic treatments.   Goal status: Met 02/16/2022   2.  Pt  will be able to perform 5 time sit to stand in </= 12 seconds c/s UE support.                         Goal status: On going 02/16/2022   LONG TERM GOALS: (target dates for all long term goals are 12 weeks  04/07/22 )   1. Patient will demonstrate/report pain at worst less than or equal to 2/10 to facilitate minimal limitation in daily activity secondary to pain symptoms.   Goal status: On Going 02/16/2022   2. Patient will demonstrate independent use of home exercise program to facilitate ability to maintain/progress functional gains from skilled physical therapy services.   Goal status: On Going 02/16/2022   3. Patient will demonstrate FOTO outcome > or = 56 % to indicate reduced disability due to condition.   Goal status: On Going 02/10/2022   4.  Patient will demonstrate Rt  LE MMT >/= 4+/5 throughout to faciltiate usual transfers, stairs, squatting at [facility] for daily life.  Goal status: On Going 02/10/2022   5.  Patient will be able to amb > 800 feet with LRAD with normalized gait pattern on community surfaces.    Goal status: On Going 02/16/2022   6.  Pt will be able to navigate up and down 5 stairs with single hand rail with step over step pattern.    Goal status: On Going 02/16/2022   7. Pt will improve her Rt knee flexion to >/= 100 degrees for improved functional mobility.              Goal status: On Going 02/16/2022    PLAN:   PT FREQUENCY: Daily for 2 weeks then likely 2-3X/week for 2 weeks   PT DURATION: 12 weeks   PLANNED INTERVENTIONS: Therapeutic exercises, Therapeutic activity, Neuro Muscular re-education, Balance training, Gait training, Patient/Family education, Joint mobilization, Stair training, DME instructions, Dry Needling, Electrical stimulation, Traction, Cryotherapy, vasopneumatic deviceMoist heat, Taping, Ultrasound, Ionotophoresis '4mg'$ /ml Dexamethasone, and Manual therapy.  All included unless contraindicated   PLAN FOR NEXT SESSION: Manual for mobility gains.   Progressive quadriceps strengthening and focus on flexion AROM.      Farley Ly PT, MPT

## 2022-02-20 ENCOUNTER — Encounter: Payer: Self-pay | Admitting: Orthopaedic Surgery

## 2022-02-20 ENCOUNTER — Encounter: Payer: Medicare Other | Admitting: Physician Assistant

## 2022-02-20 ENCOUNTER — Ambulatory Visit (INDEPENDENT_AMBULATORY_CARE_PROVIDER_SITE_OTHER): Payer: Medicare Other | Admitting: Orthopaedic Surgery

## 2022-02-20 ENCOUNTER — Encounter: Payer: Self-pay | Admitting: Physical Therapy

## 2022-02-20 ENCOUNTER — Ambulatory Visit (INDEPENDENT_AMBULATORY_CARE_PROVIDER_SITE_OTHER): Payer: Medicare Other | Admitting: Physical Therapy

## 2022-02-20 DIAGNOSIS — M25561 Pain in right knee: Secondary | ICD-10-CM | POA: Diagnosis not present

## 2022-02-20 DIAGNOSIS — R262 Difficulty in walking, not elsewhere classified: Secondary | ICD-10-CM

## 2022-02-20 DIAGNOSIS — M25661 Stiffness of right knee, not elsewhere classified: Secondary | ICD-10-CM

## 2022-02-20 DIAGNOSIS — Z96651 Presence of right artificial knee joint: Secondary | ICD-10-CM

## 2022-02-20 DIAGNOSIS — R600 Localized edema: Secondary | ICD-10-CM | POA: Diagnosis not present

## 2022-02-20 NOTE — Therapy (Signed)
OUTPATIENT PHYSICAL THERAPY TREATMENT   Patient Name: Kelly [name] MRN: [id number] DOB:1946-01-05, 77 y.o., female Today's Date: 02/20/2022  PCP: Elby Showers MD  REFERRING PROVIDER: Mcarthur Rossetti, MD    END OF SESSION:   PT End of Session - 02/20/22 1441     Visit Number 12    Number of Visits 28    Date for PT Re-Evaluation 04/07/22    Authorization Type Medicare/BCBS    Progress Note Due on Visit 18    PT Start Time 1435    PT Stop Time 2446    PT Time Calculation (min) 42 min    Activity Tolerance Patient tolerated treatment well    Behavior During Therapy WFL for tasks assessed/performed                Past Medical History:  Diagnosis Date   Arthritis    Cataract    Chronic kidney disease    Depression    Hyperlipidemia    Hypertension    Osteoporosis    Past Surgical History:  Procedure Laterality Date   ABDOMINAL HYSTERECTOMY     BREAST REDUCTION SURGERY     and lift   BREAST REDUCTION SURGERY Bilateral    CARPAL TUNNEL RELEASE  06/2009   right   CATARACT EXTRACTION     CHOLECYSTECTOMY     FACIAL COSMETIC SURGERY  03/2008   KNEE ARTHROSCOPY  2007   left & right   KNEE CLOSED REDUCTION Right 02/09/2022   Procedure: CLOSED MANIPULATION RIGHT TOTAL KNEE ARTHROPLASTY;  Surgeon: Mcarthur Rossetti, MD;  Location: [place];  Service: Orthopedics;  Laterality: Right;   LIPOSUCTION EXTREMITIES     thighs   PARTIAL KNEE ARTHROPLASTY Right 10/02/2014   Procedure: RIGHT KNEE MEDIAL UNICOMPARTMENTAL ARTHROPLASTY;  Surgeon: Mcarthur Rossetti, MD;  [facility];  Service: Orthopedics;  Laterality: Right;   REDUCTION MAMMAPLASTY     bilateral   TOTAL KNEE REVISION  10/12/2011   Procedure: TOTAL KNEE REVISION;  Surgeon: Mcarthur Rossetti, MD;  [facility];  Service: Orthopedics;  Laterality: Left;  Left Total Knee Revision Arthroplasty   TOTAL KNEE REVISION Right 12/09/2021   Procedure: CONVERT RIGHT UNI KNEE  ARTHROPLASTY TO TOTAL KNEE ARTHROPLASTY;  Surgeon: Mcarthur Rossetti, MD;  [facility];  Service: Orthopedics;  Laterality: Right;   TRIGGER FINGER RELEASE  06/2009   right 4th finger   Patient Active Problem List   Diagnosis Date Noted   Arthrofibrosis of right total knee arthroplasty ([place]) 02/09/2022   Status post revision of total knee, right 12/09/2021   Arthritis of right knee 12/08/2021   Chronic left-sided low back pain with left-sided sciatica 12/17/2019   Diabetes mellitus type 2 in obese ([place]) 12/13/2019   Chronic rhinitis 03/03/2019   Constipation 10/31/2016   Impaired glucose tolerance 10/31/2016   Stage III chronic kidney disease ([place]) 10/31/2016   Urge urinary incontinence 10/31/2016   Status post right partial knee replacement 10/02/2014   Anxiety and depression 11/06/2011   Hypertension 10/13/2010   Hyperlipidemia 10/13/2010   Osteoarthritis 10/13/2010   UNSPECIFIED IRON DEFICIENCY ANEMIA 04/11/2010   DIARRHEA 04/11/2010    REFERRING DIAG: K86.381 (ICD-10-CM) - Status post revision of total knee, right   THERAPY DIAG:  Acute pain of right knee  Difficulty in walking, not elsewhere classified  Stiffness of right knee, not elsewhere classified  Localized edema  Rationale for Evaluation and Treatment Rehabilitation  PERTINENT HISTORY:  Rt TKA 12/09/21  Rt knee surgery: uni knee arthroplasty injection 09/16/2020   hyperlipidemia, depression, HTN, osteoporosis  PRECAUTIONS:  none  SUBJECTIVE:                                                                                                                                                                                      SUBJECTIVE STATEMENT:   Doing well, I have an appointment with the surgeon after you today. Have been working and exercising but trying not to overdo it like we talked about. Tennis ball is really helping me.   PAIN:  NPRS scale: 3/10 Pain location: Rt knee  Pain  description: throbbing  Aggravating factors: just trying to work knee  Relieving factors: heat, massage, ice    OBJECTIVE: (objective measures completed at initial evaluation unless otherwise dated)  DIAGNOSTIC FINDINGS: 12/09/21 IMPRESSION: 1. Right total knee arthroplasty revision without acute postoperative complication.images following TKA    PATIENT SURVEYS:  02/10/2022: FOTO 53 (was 46, Goal 56)  01/09/22: FOTO intake:  46%   COGNITION: Overall cognitive status: WFL                  SENSATION: WFL:    EDEMA:  01/09/22: Circumferential: Rt:  50 centimeters                           Left: 46 centimeters MUSCLE LENGTH: 01/09/22: Hamstrings: Right 75 deg; Left 84 deg     POSTURE: rounded shoulders and forward head   PALPATION: 01/09/22: TTP: both sides of incision site on Rt knee    LOWER EXTREMITY ROM:    ROM A: active,  P: passive Right 01/09/22 Left 01/09/22 Rt 01/11/22 Right 01/20/22 Right 02/10/2022 Post-manipulation R 02/15/22 Right 02/16/2022 Right 1/8  Hip flexion            Knee flexion A: 76 P: 80 A: 94 A: 76 P: 84 A: 80 P: 86 Active 78 Active seated 65*; supine 70* Passive supine 80 AAROM 82* seated   Knee extension A: -10 P: -8 A: -2 A: -8 P: -6 A (LAQ): -5 Active -2 Active seated 22*; supine 11* Active supine 0 degrees Active long sitting 5*    (Blank rows = not tested)   LOWER EXTREMITY MMT:   MMT Right 01/09/22 Left 01/09/22  Hip flexion 4-/5 4-/5  Hip extension      Hip abduction 4/5 4/5  Hip adduction 4/5 4/5  Hip internal rotation      Hip external rotation      Knee flexion 3+/5 5/5  Knee extension 3+/5 5/5   (Blank rows = not tested)  FUNCTIONAL TESTS:  01/09/22: 5 time sit to stand: 17 seconds c UE support   GAIT: Distance walked: 20 feet Assistive device utilized: Single point cane Level of assistance: Modified independence Comments: forward  trunk flexion, antalgic gait pattern, increased knee flexion on Rt      TODAY'S TREATMENT   02/20/22  TherEx  Scifit bike seat 12 x6 minutes Self knee flexion stretches on 6 inch step 10x10 second holds  HS curls with 5 second holds x10 STS (no UEs) cues for WB onto R LE + eccentric lower for quad x15   Manual  Patella mobs grade III all directions  Retrograde massage LE elevated Knee flexion AAROM with  overpressure x10 edge of mat table    02/17/2022 Tailgate knee flexion 1 minute Knee flexion AAROM (Left pushes Right into flexion) 10X 10 seconds with PT overpressure Recumbent bike Seat 5 for 5 minutes AAROM for knee flexion  Functional Activities (sit to stand, stairs): Double Leg Press 75# emphasis on full AROM 10X Single Leg Press 25# emphasis on full AROM 15X Step-stretch with 8 inch step 5X 5 seconds  Manual: PT overpressure into flexion and extension with AAROM flexion   Vaso Right knee 10 minutes Medium 34*   02/16/2022 T[place] knee flexion 1 minute Knee flexion AAROM (Left pushes Right into flexion) 10X 10 seconds with PT overpressure Quad sets with Right heel prop 10X 5 seconds with PT overpressure  Functional Activities (sit to stand, stairs): Double Leg Press 75# emphasis on full AROM 20X Single Leg Press 31# emphasis on full AROM 20X Step-stretch with 8 inch step 5X 5 seconds  Manual: PT overpressure into flexion and extension with AAROM flexion and quadriceps sets  Vaso Right knee 10 minutes Medium 34*   02/15/22  TherEx  Scifit bike for knee ROM seat 12 x6 minutes Hip flexor and quad mm stretch 2x60 seconds Bridges x10 able to tolerate about 1/2 height     Manual  Patella mobs all directions Knee extension OP in supine x10 to tolerance  Tennis ball STM R quad- multiple spasms noted  MET for quads and hams sitting at edge of mat table x10 each with 3 second holds    PATIENT EDUCATION:  Education details: ROM measures/progress with PT  Person educated: Patient Education method: Consulting civil engineer, Media planner,  Verbal cues, and Handouts Education comprehension: verbalized understanding, returned demonstration, and verbal cues required   HOME EXERCISE PROGRAM: Access Code: [email or web address] URL: [email or web address] Date: 02/08/2022 Prepared by: Vista Mink  Exercises - Sit to Stand  - 3 x daily - 7 x weekly - 2 sets - 10 reps - Seated Long Arc Quad  - 3 x daily - 7 x weekly - 2 sets - 10 reps - 5 seconds hold - Supine Heel Slide with Strap  - 3 x daily - 7 x weekly - 2 sets - 10 reps - Supine Straight Leg Raises  - 3 x daily - 7 x weekly - 2 sets - 10 reps - Supine Quad Set  - 3 x daily - 7 x weekly - 2 sets - 10 reps - 5 seconds hold - Supine Quadricep Sets  - 5 x daily - 7 x weekly - 2 sets - 10 reps - 5 second hold - Seated Knee Flexion AAROM  - 5 x daily - 7 x weekly - 1 sets - 1 reps - 3 minutes hold     ASSESSMENT:   CLINICAL IMPRESSION:   Melayah arrives today doing really well,  sees the doctor after PT today. Took some measures prior to MD appointment then continued working on general ROM and strengthening as well. ROM is looking better, there is some inter-rater variability with measures but she seems to be slowly improving. Will continue efforts, hopefully MD will be happy with her progress.    OBJECTIVE IMPAIRMENTS: Abnormal gait, decreased activity tolerance, decreased balance, decreased mobility, difficulty walking, decreased ROM, decreased strength, increased edema, impaired flexibility, and pain.    ACTIVITY LIMITATIONS: lifting, bending, sitting, standing, squatting, sleeping, stairs, and transfers   PARTICIPATION LIMITATIONS: cleaning, driving, and community activity   PERSONAL FACTORS: 3+ comorbidities: see above  are also affecting patient's functional outcome.    REHAB POTENTIAL: Good   CLINICAL DECISION MAKING: Stable/uncomplicated   EVALUATION COMPLEXITY: Low     GOALS: Goals reviewed with patient? Yes   SHORT TERM GOALS: (target date for Short term  goals are 3 weeks 02/03/22)    1.  Patient will demonstrate independent use of home exercise program to maintain progress from in clinic treatments.   Goal status: Met 02/16/2022   2.  Pt will be able to perform 5 time sit to stand in </= 12 seconds c/s UE support.                         Goal status: On going 02/16/2022   LONG TERM GOALS: (target dates for all long term goals are 12 weeks  04/07/22 )   1. Patient will demonstrate/report pain at worst less than or equal to 2/10 to facilitate minimal limitation in daily activity secondary to pain symptoms.   Goal status: On Going 02/16/2022   2. Patient will demonstrate independent use of home exercise program to facilitate ability to maintain/progress functional gains from skilled physical therapy services.   Goal status: On Going 02/16/2022   3. Patient will demonstrate FOTO outcome > or = 56 % to indicate reduced disability due to condition.   Goal status: On Going 02/10/2022   4.  Patient will demonstrate Rt  LE MMT >/= 4+/5 throughout to faciltiate usual transfers, stairs, squatting at [facility] for daily life.  Goal status: On Going 02/10/2022   5.  Patient will be able to amb > 800 feet with LRAD with normalized gait pattern on community surfaces.    Goal status: On Going 02/16/2022   6.  Pt will be able to navigate up and down 5 stairs with single hand rail with step over step pattern.    Goal status: On Going 02/16/2022   7. Pt will improve her Rt knee flexion to >/= 100 degrees for improved functional mobility.              Goal status: On Going 02/16/2022    PLAN:   PT FREQUENCY: Daily for 2 weeks then likely 2-3X/week for 2 weeks   PT DURATION: 12 weeks   PLANNED INTERVENTIONS: Therapeutic exercises, Therapeutic activity, Neuro Muscular re-education, Balance training, Gait training, Patient/Family education, Joint mobilization, Stair training, DME instructions, Dry Needling, Electrical stimulation, Traction, Cryotherapy,  vasopneumatic deviceMoist heat, Taping, Ultrasound, Ionotophoresis '4mg'$ /ml Dexamethasone, and Manual therapy.  All included unless contraindicated   PLAN FOR NEXT SESSION: Manual for mobility gains.  Progressive quadriceps strengthening and focus on flexion AROM.      Deniece Ree PT DPT PN2

## 2022-02-20 NOTE — Progress Notes (Signed)
The patient is well-known to me.  She is coming in for follow-up after having a manipulation of her right knee under anesthesia.  I was able to flex her to past 100 degrees.  She seems happy however on my exam today I can still barely flex her to maybe 80 degrees.  She feels like she can get further and that the hydrocodone is helping her.  She will continue aggressive physical therapy.  I am at a loss of what else I can do for now based on her pain tolerance but she is so far standing motivated and looks better overall.  Will see her back in a month to see how she is doing.  Will have a standing AP and lateral of her right operative knee at that visit.

## 2022-02-21 ENCOUNTER — Ambulatory Visit (INDEPENDENT_AMBULATORY_CARE_PROVIDER_SITE_OTHER): Payer: Medicare Other | Admitting: Physical Therapy

## 2022-02-21 ENCOUNTER — Encounter: Payer: Self-pay | Admitting: Physical Therapy

## 2022-02-21 DIAGNOSIS — M5441 Lumbago with sciatica, right side: Secondary | ICD-10-CM | POA: Diagnosis not present

## 2022-02-21 DIAGNOSIS — G8929 Other chronic pain: Secondary | ICD-10-CM | POA: Diagnosis not present

## 2022-02-21 DIAGNOSIS — R293 Abnormal posture: Secondary | ICD-10-CM | POA: Diagnosis not present

## 2022-02-21 DIAGNOSIS — M25661 Stiffness of right knee, not elsewhere classified: Secondary | ICD-10-CM

## 2022-02-21 DIAGNOSIS — R600 Localized edema: Secondary | ICD-10-CM

## 2022-02-21 DIAGNOSIS — M25561 Pain in right knee: Secondary | ICD-10-CM

## 2022-02-21 DIAGNOSIS — R262 Difficulty in walking, not elsewhere classified: Secondary | ICD-10-CM

## 2022-02-21 NOTE — Therapy (Signed)
OUTPATIENT PHYSICAL THERAPY TREATMENT   Patient Name: Kelly [name] MRN: [id number] DOB:[birth year], 77 y.o., female Today's Date: 02/21/2022  PCP: Elby Showers MD  REFERRING PROVIDER: Mcarthur Rossetti, MD    END OF SESSION:   PT End of Session - 02/21/22 1028     Visit Number 13    Number of Visits 28    Date for PT Re-Evaluation 04/07/22    Authorization Type Medicare/BCBS    Progress Note Due on Visit 18    PT Start Time 8003    PT Stop Time 1100    PT Time Calculation (min) 45 min    Activity Tolerance Patient tolerated treatment well    Behavior During Therapy WFL for tasks assessed/performed                 Past Medical History:  Diagnosis Date   Arthritis    Cataract    Chronic kidney disease    Depression    Hyperlipidemia    Hypertension    Osteoporosis    Past Surgical History:  Procedure Laterality Date   ABDOMINAL HYSTERECTOMY     BREAST REDUCTION SURGERY     and lift   BREAST REDUCTION SURGERY Bilateral    CARPAL TUNNEL RELEASE  06/2009   right   CATARACT EXTRACTION     CHOLECYSTECTOMY     FACIAL COSMETIC SURGERY  03/2008   KNEE ARTHROSCOPY  2007   left & right   KNEE CLOSED REDUCTION Right 02/09/2022   Procedure: CLOSED MANIPULATION RIGHT TOTAL KNEE ARTHROPLASTY;  Surgeon: Mcarthur Rossetti, MD;  Location: [facility];  Service: Orthopedics;  Laterality: Right;   LIPOSUCTION EXTREMITIES     thighs   PARTIAL KNEE ARTHROPLASTY Right 10/02/2014   Procedure: RIGHT KNEE MEDIAL UNICOMPARTMENTAL ARTHROPLASTY;  Surgeon: Mcarthur Rossetti, MD;  Location: [facility];  Service: Orthopedics;  Laterality: Right;   REDUCTION MAMMAPLASTY     bilateral   TOTAL KNEE REVISION  10/12/2011   Procedure: TOTAL KNEE REVISION;  Surgeon: Mcarthur Rossetti, MD;  Location: [facility];  Service: Orthopedics;  Laterality: Left;  Left Total Knee Revision Arthroplasty   TOTAL KNEE REVISION Right 12/09/2021   Procedure: CONVERT RIGHT UNI KNEE  ARTHROPLASTY TO TOTAL KNEE ARTHROPLASTY;  Surgeon: Mcarthur Rossetti, MD;  Location: [facility];  Service: Orthopedics;  Laterality: Right;   TRIGGER FINGER RELEASE  06/2009   right 4th finger   Patient Active Problem List   Diagnosis Date Noted   Arthrofibrosis of right total knee arthroplasty ([place]) 02/09/2022   Status post revision of total knee, right 12/09/2021   Arthritis of right knee 12/08/2021   Chronic left-sided low back pain with left-sided sciatica 12/17/2019   Diabetes mellitus type 2 in obese ([place]) 12/13/2019   Chronic rhinitis 03/03/2019   Constipation 10/31/2016   Impaired glucose tolerance 10/31/2016   Stage III chronic kidney disease ([place]) 10/31/2016   Urge urinary incontinence 10/31/2016   Status post right partial knee replacement 10/02/2014   Anxiety and depression 11/06/2011   Hypertension 10/13/2010   Hyperlipidemia 10/13/2010   Osteoarthritis 10/13/2010   UNSPECIFIED IRON DEFICIENCY ANEMIA 04/11/2010   DIARRHEA 04/11/2010    REFERRING DIAG: K91.791 (ICD-10-CM) - Status post revision of total knee, right   THERAPY DIAG:  Acute pain of right knee  Difficulty in walking, not elsewhere classified  Stiffness of right knee, not elsewhere classified  Localized edema  Chronic bilateral low back pain with right-sided sciatica  Abnormal posture  Rationale for  Evaluation and Treatment Rehabilitation  PERTINENT HISTORY:  Rt TKA 12/09/21            Rt knee surgery: uni knee arthroplasty injection 09/16/2020   hyperlipidemia, depression, HTN, osteoporosis  PRECAUTIONS:  none  SUBJECTIVE:                                                                                                                                                                                      SUBJECTIVE STATEMENT:   Pt arriving stating she has been picking and choosing her exercises to do daily. Pt was instructed to perform all her exercises 2-3 each day at least.   PAIN:  NPRS  scale: 3/10 Pain location: Rt knee  Pain description: throbbing  Aggravating factors: just trying to work knee  Relieving factors: heat, massage, ice    OBJECTIVE: (objective measures completed at initial evaluation unless otherwise dated)  DIAGNOSTIC FINDINGS: 12/09/21 IMPRESSION: 1. Right total knee arthroplasty revision without acute postoperative complication.images following TKA    PATIENT SURVEYS:  02/10/2022: FOTO 53 (was 46, Goal 56)  01/09/22: FOTO intake:  46%   COGNITION: Overall cognitive status: WFL                  SENSATION: WFL:    EDEMA:  01/09/22: Circumferential: Rt:  50 centimeters                           Left: 46 centimeters MUSCLE LENGTH: 01/09/22: Hamstrings: Right 75 deg; Left 84 deg     POSTURE: rounded shoulders and forward head   PALPATION: 01/09/22: TTP: both sides of incision site on Rt knee    LOWER EXTREMITY ROM:    ROM A: active,  P: passive Right 01/09/22 Left 01/09/22 Rt 01/11/22 Right 01/20/22 Right 02/10/2022 Post-manipulation R 02/15/22 Right 02/16/2022 Right 1/8  Hip flexion            Knee flexion A: 76 P: 80 A: 94 A: 76 P: 84 A: 80 P: 86 Active 78 Active seated 65*; supine 70* Passive supine 80 AAROM 82* seated   Knee extension A: -10 P: -8 A: -2 A: -8 P: -6 A (LAQ): -5 Active -2 Active seated 22*; supine 11* Active supine 0 degrees Active long sitting 5*    (Blank rows = not tested)   LOWER EXTREMITY MMT:   MMT Right 01/09/22 Left 01/09/22  Hip flexion 4-/5 4-/5  Hip extension      Hip abduction 4/5 4/5  Hip adduction 4/5 4/5  Hip internal rotation      Hip external rotation      Knee flexion  3+/5 5/5  Knee extension 3+/5 5/5   (Blank rows = not tested)     FUNCTIONAL TESTS:  01/09/22: 5 time sit to stand: 17 seconds c UE support   GAIT: Distance walked: 20 feet Assistive device utilized: Single point cane Level of assistance: Modified independence Comments: forward  trunk flexion, antalgic gait  pattern, increased knee flexion on Rt     TODAY'S TREATMENT  02/21/22:  TherEx:  Scifit bike: Level 1: x 8 minutes Leg Press: bil LE's 75# 2 x 15 Leg Press: Rt LE only: 43# 2 x 10 Lunges on 6 inch step with Rt LE on top x 10 holding 5-10 seconds Seated Hamstring curls Level 3 band 2 x 10  Supine in modified Thomas stretch position with quad stretch using strap x 10 holding 10 seconds Manual:  STM to Rt quad Percussor to Rt quad x 4 minutes PROM in supine flexion and extension Rt knee PROM in sitting with IR and distraction for flexion of Rt knee Contract/relax x 6 holding 10 seconds each c contralateral leg performing opposite motion  Self Care:  Pt edu on sleeping/napping in her bed instead of recliner chair. Pt also edu in pillow placement to proper under calves to help keep knees straight.     02/20/22 TherEx  Scifit bike seat 12 x6 minutes Self knee flexion stretches on 6 inch step 10x10 second holds  HS curls with 5 second holds x10 STS (no UEs) cues for WB onto R LE + eccentric lower for quad x15   Manual  Patella mobs grade III all directions  Retrograde massage LE elevated Knee flexion AAROM with  overpressure x10 edge of mat table    02/17/2022 [place] knee flexion 1 minute Knee flexion AAROM (Left pushes Right into flexion) 10X 10 seconds with PT overpressure Recumbent bike Seat 5 for 5 minutes AAROM for knee flexion  Functional Activities (sit to stand, stairs): Double Leg Press 75# emphasis on full AROM 10X Single Leg Press 25# emphasis on full AROM 15X Step-stretch with 8 inch step 5X 5 seconds  Manual: PT overpressure into flexion and extension with AAROM flexion   Vaso Right knee 10 minutes Medium 34*   02/16/2022 [place] knee flexion 1 minute Knee flexion AAROM (Left pushes Right into flexion) 10X 10 seconds with PT overpressure Quad sets with Right heel prop 10X 5 seconds with PT overpressure  Functional Activities (sit to stand,  stairs): Double Leg Press 75# emphasis on full AROM 20X Single Leg Press 31# emphasis on full AROM 20X Step-stretch with 8 inch step 5X 5 seconds  Manual: PT overpressure into flexion and extension with AAROM flexion and quadriceps sets  Vaso Right knee 10 minutes Medium 34*   02/15/22  TherEx  Scifit bike for knee ROM seat 12 x6 minutes Hip flexor and quad mm stretch 2x60 seconds Bridges x10 able to tolerate about 1/2 height     Manual  Patella mobs all directions Knee extension OP in supine x10 to tolerance  Tennis ball STM R quad- multiple spasms noted  MET for quads and hams sitting at edge of mat table x10 each with 3 second holds    PATIENT EDUCATION:  Education details: ROM measures/progress with PT  Person educated: Patient Education method: Consulting civil engineer, Media planner, Verbal cues, and Handouts Education comprehension: verbalized understanding, returned demonstration, and verbal cues required   HOME EXERCISE PROGRAM: Access Code: [email or web address] URL: [email or web address] Date: 02/08/2022 Prepared by: Vista Mink  Exercises - Sit to Stand  -  3 x daily - 7 x weekly - 2 sets - 10 reps - Seated Long Arc Quad  - 3 x daily - 7 x weekly - 2 sets - 10 reps - 5 seconds hold - Supine Heel Slide with Strap  - 3 x daily - 7 x weekly - 2 sets - 10 reps - Supine Straight Leg Raises  - 3 x daily - 7 x weekly - 2 sets - 10 reps - Supine Quad Set  - 3 x daily - 7 x weekly - 2 sets - 10 reps - 5 seconds hold - Supine Quadricep Sets  - 5 x daily - 7 x weekly - 2 sets - 10 reps - 5 second hold - Seated Knee Flexion AAROM  - 5 x daily - 7 x weekly - 1 sets - 1 reps - 3 minutes hold     ASSESSMENT:   CLINICAL IMPRESSION:  Pt reporting her visit with Dr. Ninfa Linden was good but he wanted her flexion in her Rt knee to be more that what she was presenting with at her visit. Pt is currently 82 degrees passively following manual therapy today. Pt was edu in frequency of her  HEP and Self soft tissue mobs to Rt quads. Pt also edu in supine quad stretch using belt/strap to help with ROM. Continue skilled PT to maximize pt's function.    OBJECTIVE IMPAIRMENTS: Abnormal gait, decreased activity tolerance, decreased balance, decreased mobility, difficulty walking, decreased ROM, decreased strength, increased edema, impaired flexibility, and pain.    ACTIVITY LIMITATIONS: lifting, bending, sitting, standing, squatting, sleeping, stairs, and transfers   PARTICIPATION LIMITATIONS: cleaning, driving, and community activity   PERSONAL FACTORS: 3+ comorbidities: see above  are also affecting patient's functional outcome.    REHAB POTENTIAL: Good   CLINICAL DECISION MAKING: Stable/uncomplicated   EVALUATION COMPLEXITY: Low     GOALS: Goals reviewed with patient? Yes   SHORT TERM GOALS: (target date for Short term goals are 3 weeks 02/03/22)    1.  Patient will demonstrate independent use of home exercise program to maintain progress from in clinic treatments.   Goal status: Met 02/16/2022   2.  Pt will be able to perform 5 time sit to stand in </= 12 seconds c/s UE support.                         Goal status: On going 02/16/2022   LONG TERM GOALS: (target dates for all long term goals are 12 weeks  04/07/22 )   1. Patient will demonstrate/report pain at worst less than or equal to 2/10 to facilitate minimal limitation in daily activity secondary to pain symptoms.   Goal status: On Going 02/16/2022   2. Patient will demonstrate independent use of home exercise program to facilitate ability to maintain/progress functional gains from skilled physical therapy services.   Goal status: On Going 02/16/2022   3. Patient will demonstrate FOTO outcome > or = 56 % to indicate reduced disability due to condition.   Goal status: On Going 02/10/2022   4.  Patient will demonstrate Rt  LE MMT >/= 4+/5 throughout to faciltiate usual transfers, stairs, squatting at [facility] for daily  life.  Goal status: On Going 02/10/2022   5.  Patient will be able to amb > 800 feet with LRAD with normalized gait pattern on community surfaces.    Goal status: On Going 02/16/2022   6.  Pt will be able to navigate  up and down 5 stairs with single hand rail with step over step pattern.    Goal status: On Going 02/16/2022   7. Pt will improve her Rt knee flexion to >/= 100 degrees for improved functional mobility.              Goal status: On Going 02/16/2022    PLAN:   PT FREQUENCY: Daily for 2 weeks then likely 2-3X/week for 2 weeks   PT DURATION: 12 weeks   PLANNED INTERVENTIONS: Therapeutic exercises, Therapeutic activity, Neuro Muscular re-education, Balance training, Gait training, Patient/Family education, Joint mobilization, Stair training, DME instructions, Dry Needling, Electrical stimulation, Traction, Cryotherapy, vasopneumatic deviceMoist heat, Taping, Ultrasound, Ionotophoresis '4mg'$ /ml Dexamethasone, and Manual therapy.  All included unless contraindicated   PLAN FOR NEXT SESSION: Manual for mobility gains.  Progressive quadriceps strengthening and focus on flexion AROM.      Kearney Hard, PT, MPT 02/21/22 10:38 AM

## 2022-02-22 ENCOUNTER — Ambulatory Visit (INDEPENDENT_AMBULATORY_CARE_PROVIDER_SITE_OTHER): Payer: Medicare Other | Admitting: Physical Therapy

## 2022-02-22 ENCOUNTER — Encounter: Payer: Self-pay | Admitting: Physical Therapy

## 2022-02-22 DIAGNOSIS — R262 Difficulty in walking, not elsewhere classified: Secondary | ICD-10-CM

## 2022-02-22 DIAGNOSIS — R600 Localized edema: Secondary | ICD-10-CM

## 2022-02-22 DIAGNOSIS — M25661 Stiffness of right knee, not elsewhere classified: Secondary | ICD-10-CM | POA: Diagnosis not present

## 2022-02-22 DIAGNOSIS — M25561 Pain in right knee: Secondary | ICD-10-CM

## 2022-02-22 NOTE — Therapy (Signed)
OUTPATIENT PHYSICAL THERAPY TREATMENT   Patient Name: Kelly [name] MRN: [id number] DOB:01/01/1946, 77 y.o., female Today's Date: 02/22/2022  PCP: Elby Showers MD  REFERRING PROVIDER: Mcarthur Rossetti, MD    END OF SESSION:   PT End of Session - 02/22/22 1405     Visit Number 14    Number of Visits 28    Date for PT Re-Evaluation 04/07/22    Authorization Type Medicare/BCBS    Progress Note Due on Visit 18    PT Start Time 1346    PT Stop Time 1428    PT Time Calculation (min) 42 min    Activity Tolerance Patient tolerated treatment well    Behavior During Therapy WFL for tasks assessed/performed                  Past Medical History:  Diagnosis Date   Arthritis    Cataract    Chronic kidney disease    Depression    Hyperlipidemia    Hypertension    Osteoporosis    Past Surgical History:  Procedure Laterality Date   ABDOMINAL HYSTERECTOMY     BREAST REDUCTION SURGERY     and lift   BREAST REDUCTION SURGERY Bilateral    CARPAL TUNNEL RELEASE  06/2009   right   CATARACT EXTRACTION     CHOLECYSTECTOMY     FACIAL COSMETIC SURGERY  03/2008   KNEE ARTHROSCOPY  2007   left & right   KNEE CLOSED REDUCTION Right 02/09/2022   Procedure: CLOSED MANIPULATION RIGHT TOTAL KNEE ARTHROPLASTY;  Surgeon: Mcarthur Rossetti, MD;  Location: [facility];  Service: Orthopedics;  Laterality: Right;   LIPOSUCTION EXTREMITIES     thighs   PARTIAL KNEE ARTHROPLASTY Right 10/02/2014   Procedure: RIGHT KNEE MEDIAL UNICOMPARTMENTAL ARTHROPLASTY;  Surgeon: Mcarthur Rossetti, MD;  Location: [facility];  Service: Orthopedics;  Laterality: Right;   REDUCTION MAMMAPLASTY     bilateral   TOTAL KNEE REVISION  10/12/2011   Procedure: TOTAL KNEE REVISION;  Surgeon: Mcarthur Rossetti, MD;  Location: [facility];  Service: Orthopedics;  Laterality: Left;  Left Total Knee Revision Arthroplasty   TOTAL KNEE REVISION Right 12/09/2021   Procedure: CONVERT RIGHT UNI KNEE  ARTHROPLASTY TO TOTAL KNEE ARTHROPLASTY;  Surgeon: Mcarthur Rossetti, MD;  Location: [facility];  Service: Orthopedics;  Laterality: Right;   TRIGGER FINGER RELEASE  06/2009   right 4th finger   Patient Active Problem List   Diagnosis Date Noted   Arthrofibrosis of right total knee arthroplasty ([place]) 02/09/2022   Status post revision of total knee, right 12/09/2021   Arthritis of right knee 12/08/2021   Chronic left-sided low back pain with left-sided sciatica 12/17/2019   Diabetes mellitus type 2 in obese ([place]) 12/13/2019   Chronic rhinitis 03/03/2019   Constipation 10/31/2016   Impaired glucose tolerance 10/31/2016   Stage III chronic kidney disease ([place]) 10/31/2016   Urge urinary incontinence 10/31/2016   Status post right partial knee replacement 10/02/2014   Anxiety and depression 11/06/2011   Hypertension 10/13/2010   Hyperlipidemia 10/13/2010   Osteoarthritis 10/13/2010   UNSPECIFIED IRON DEFICIENCY ANEMIA 04/11/2010   DIARRHEA 04/11/2010    REFERRING DIAG: W09.811 (ICD-10-CM) - Status post revision of total knee, right   THERAPY DIAG:  Acute pain of right knee  Difficulty in walking, not elsewhere classified  Stiffness of right knee, not elsewhere classified  Localized edema  Rationale for Evaluation and Treatment Rehabilitation  PERTINENT HISTORY:  Rt TKA 12/09/21  Rt knee surgery: uni knee arthroplasty injection 09/16/2020   hyperlipidemia, depression, HTN, osteoporosis  PRECAUTIONS:  none  SUBJECTIVE:                                                                                                                                                                                      SUBJECTIVE STATEMENT:   The doctor told me I was at 67 before the surgery, I was at 83 and he seemed a bit brusque with me. I'm still working hard at home. Had excruciating pain in my knee last night. 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   PAIN:  NPRS scale: 3/10 Pain location: Rt knee  Pain description: throbbing  Aggravating factors: just trying to work knee  Relieving factors: heat, massage, ice    OBJECTIVE: (objective measures completed at initial evaluation unless otherwise dated)  DIAGNOSTIC FINDINGS: 12/09/21 IMPRESSION: 1. Right total knee arthroplasty revision without acute postoperative complication.images following TKA    PATIENT SURVEYS:  02/10/2022: FOTO 53 (was 46, Goal 56)  01/09/22: FOTO intake:  46%   COGNITION: Overall cognitive status: WFL                  SENSATION: WFL:    EDEMA:  01/09/22: Circumferential: Rt:  50 centimeters                           Left: 46 centimeters MUSCLE LENGTH: 01/09/22: Hamstrings: Right 75 deg; Left 84 deg     POSTURE: rounded shoulders and forward head   PALPATION: 01/09/22: TTP: both sides of incision site on Rt knee    LOWER EXTREMITY ROM:    ROM A: active,  P: passive Right 01/09/22 Left 01/09/22 Rt 01/11/22 Right 01/20/22 Right 02/10/2022 Post-manipulation R 02/15/22 Right 02/16/2022 Right 1/8  Hip flexion            Knee flexion A: 76 P: 80 A: 94 A: 76 P: 84 A: 80 P: 86 Active 78 Active seated 65*; supine 70* Passive supine 80 AAROM 82* seated   Knee extension A: -10 P: -8 A: -2 A: -8 P: -6 A (LAQ): -5 Active -2 Active seated 22*; supine 11* Active supine 0 degrees Active long sitting 5*    (Blank rows = not tested)   LOWER EXTREMITY MMT:   MMT Right 01/09/22 Left 01/09/22  Hip flexion 4-/5 4-/5  Hip extension      Hip abduction 4/5 4/5  Hip adduction 4/5 4/5  Hip internal rotation      Hip external rotation  Knee flexion 3+/5 5/5  Knee extension 3+/5 5/5   (Blank rows = not tested)     FUNCTIONAL TESTS:  01/09/22: 5 time sit to stand: 17 seconds c UE support   GAIT: Distance walked: 20 feet Assistive device utilized: Single point cane Level of assistance: Modified independence Comments: forward  trunk  flexion, antalgic gait pattern, increased knee flexion on Rt     TODAY'S TREATMENT    02/22/22  TherEx  Scifit bike 7 minutes seat 11 full rotations Self knee flexion stretch x15 with 10 second holds  Standing quad and hip flexor stretch with 6 inch box 3x30 seconds Forward step ups 4 inch box x10 B Lateral step ups 4 inch box x10 B Knee flexion AAROM with overpressure from PT x10    02/21/22:  TherEx:  Scifit bike: Level 1: x 8 minutes Leg Press: bil LE's 75# 2 x 15 Leg Press: Rt LE only: 43# 2 x 10 Lunges on 6 inch step with Rt LE on top x 10 holding 5-10 seconds Seated Hamstring curls Level 3 band 2 x 10  Supine in modified Thomas stretch position with quad stretch using strap x 10 holding 10 seconds Manual:  STM to Rt quad Percussor to Rt quad x 4 minutes PROM in supine flexion and extension Rt knee PROM in sitting with IR and distraction for flexion of Rt knee Contract/relax x 6 holding 10 seconds each c contralateral leg performing opposite motion  Self Care:  Pt edu on sleeping/napping in her bed instead of recliner chair. Pt also edu in pillow placement to proper under calves to help keep knees straight.     02/20/22 TherEx  Scifit bike seat 12 x6 minutes Self knee flexion stretches on 6 inch step 10x10 second holds  HS curls with 5 second holds x10 STS (no UEs) cues for WB onto R LE + eccentric lower for quad x15   Manual  Patella mobs grade III all directions  Retrograde massage LE elevated Knee flexion AAROM with  overpressure x10 edge of mat table    02/17/2022 [place] knee flexion 1 minute Knee flexion AAROM (Left pushes Right into flexion) 10X 10 seconds with PT overpressure Recumbent bike Seat 5 for 5 minutes AAROM for knee flexion  Functional Activities (sit to stand, stairs): Double Leg Press 75# emphasis on full AROM 10X Single Leg Press 25# emphasis on full AROM 15X Step-stretch with 8 inch step 5X 5 seconds  Manual: PT overpressure into  flexion and extension with AAROM flexion   Vaso Right knee 10 minutes Medium 34*   02/16/2022 [place] knee flexion 1 minute Knee flexion AAROM (Left pushes Right into flexion) 10X 10 seconds with PT overpressure Quad sets with Right heel prop 10X 5 seconds with PT overpressure  Functional Activities (sit to stand, stairs): Double Leg Press 75# emphasis on full AROM 20X Single Leg Press 31# emphasis on full AROM 20X Step-stretch with 8 inch step 5X 5 seconds  Manual: PT overpressure into flexion and extension with AAROM flexion and quadriceps sets  Vaso Right knee 10 minutes Medium 34*   02/15/22  TherEx  Scifit bike for knee ROM seat 12 x6 minutes Hip flexor and quad mm stretch 2x60 seconds Bridges x10 able to tolerate about 1/2 height     Manual  Patella mobs all directions Knee extension OP in supine x10 to tolerance  Tennis ball STM R quad- multiple spasms noted  MET for quads and hams sitting at edge of mat  table x10 each with 3 second holds    PATIENT EDUCATION:  Education details: ROM measures/progress with PT  Person educated: Patient Education method: Consulting civil engineer, Demonstration, Verbal cues, and Handouts Education comprehension: verbalized understanding, returned demonstration, and verbal cues required   HOME EXERCISE PROGRAM: Access Code: [email or web address] URL: [email or web address] Date: 02/08/2022 Prepared by: Vista Mink  Exercises - Sit to Stand  - 3 x daily - 7 x weekly - 2 sets - 10 reps - Seated Long Arc Quad  - 3 x daily - 7 x weekly - 2 sets - 10 reps - 5 seconds hold - Supine Heel Slide with Strap  - 3 x daily - 7 x weekly - 2 sets - 10 reps - Supine Straight Leg Raises  - 3 x daily - 7 x weekly - 2 sets - 10 reps - Supine Quad Set  - 3 x daily - 7 x weekly - 2 sets - 10 reps - 5 seconds hold - Supine Quadricep Sets  - 5 x daily - 7 x weekly - 2 sets - 10 reps - 5 second hold - Seated Knee Flexion AAROM  - 5 x daily - 7 x weekly - 1  sets - 1 reps - 3 minutes hold     ASSESSMENT:   CLINICAL IMPRESSION:   Ziyan arrives doing OK today, we continued working on knee flexion ROM but also incorporated some functional strengthening today. Flexion remains limited but she had the same problem with her other knee as well. We will continue to push ROM and strength as able, pt is in agreement that given her history with prior TKR if we can get her as functional as possible even with limited ROM she will be pleased with outcomes of surgery. Seemed a bit more fatigued than typical today, rest breaks provided PRN.    OBJECTIVE IMPAIRMENTS: Abnormal gait, decreased activity tolerance, decreased balance, decreased mobility, difficulty walking, decreased ROM, decreased strength, increased edema, impaired flexibility, and pain.    ACTIVITY LIMITATIONS: lifting, bending, sitting, standing, squatting, sleeping, stairs, and transfers   PARTICIPATION LIMITATIONS: cleaning, driving, and community activity   PERSONAL FACTORS: 3+ comorbidities: see above  are also affecting patient's functional outcome.    REHAB POTENTIAL: Good   CLINICAL DECISION MAKING: Stable/uncomplicated   EVALUATION COMPLEXITY: Low     GOALS: Goals reviewed with patient? Yes   SHORT TERM GOALS: (target date for Short term goals are 3 weeks 02/03/22)    1.  Patient will demonstrate independent use of home exercise program to maintain progress from in clinic treatments.   Goal status: Met 02/16/2022   2.  Pt will be able to perform 5 time sit to stand in </= 12 seconds c/s UE support.                         Goal status: On going 02/16/2022   LONG TERM GOALS: (target dates for all long term goals are 12 weeks  04/07/22 )   1. Patient will demonstrate/report pain at worst less than or equal to 2/10 to facilitate minimal limitation in daily activity secondary to pain symptoms.   Goal status: On Going 02/16/2022   2. Patient will demonstrate independent use of home  exercise program to facilitate ability to maintain/progress functional gains from skilled physical therapy services.   Goal status: On Going 02/16/2022   3. Patient will demonstrate FOTO outcome > or = 56 % to indicate reduced disability due to condition.  Goal status: On Going 02/10/2022   4.  Patient will demonstrate Rt  LE MMT >/= 4+/5 throughout to faciltiate usual transfers, stairs, squatting at [facility] for daily life.  Goal status: On Going 02/10/2022   5.  Patient will be able to amb > 800 feet with LRAD with normalized gait pattern on community surfaces.    Goal status: On Going 02/16/2022   6.  Pt will be able to navigate up and down 5 stairs with single hand rail with step over step pattern.    Goal status: On Going 02/16/2022   7. Pt will improve her Rt knee flexion to >/= 100 degrees for improved functional mobility.              Goal status: On Going 02/16/2022    PLAN:   PT FREQUENCY: Daily for 2 weeks then likely 2-3X/week for 2 weeks   PT DURATION: 12 weeks   PLANNED INTERVENTIONS: Therapeutic exercises, Therapeutic activity, Neuro Muscular re-education, Balance training, Gait training, Patient/Family education, Joint mobilization, Stair training, DME instructions, Dry Needling, Electrical stimulation, Traction, Cryotherapy, vasopneumatic deviceMoist heat, Taping, Ultrasound, Ionotophoresis '4mg'$ /ml Dexamethasone, and Manual therapy.  All included unless contraindicated   PLAN FOR NEXT SESSION: Manual for mobility gains.  Progressive quadriceps strengthening and focus on flexion AROM.      Deniece Ree PT DPT PN2  02/22/2022, 2:30 PM

## 2022-02-23 ENCOUNTER — Ambulatory Visit (INDEPENDENT_AMBULATORY_CARE_PROVIDER_SITE_OTHER): Payer: Medicare Other | Admitting: Physical Therapy

## 2022-02-23 ENCOUNTER — Encounter: Payer: Self-pay | Admitting: Physical Therapy

## 2022-02-23 DIAGNOSIS — M25561 Pain in right knee: Secondary | ICD-10-CM

## 2022-02-23 DIAGNOSIS — R262 Difficulty in walking, not elsewhere classified: Secondary | ICD-10-CM

## 2022-02-23 DIAGNOSIS — M25661 Stiffness of right knee, not elsewhere classified: Secondary | ICD-10-CM | POA: Diagnosis not present

## 2022-02-23 DIAGNOSIS — R600 Localized edema: Secondary | ICD-10-CM | POA: Diagnosis not present

## 2022-02-23 NOTE — Therapy (Signed)
OUTPATIENT PHYSICAL THERAPY TREATMENT   Patient Name: Kelly [name] MRN: [id number] DOB:08/12/1945, 77 y.o., female Today's Date: 02/23/2022  PCP: Elby Showers MD  REFERRING PROVIDER: Mcarthur Rossetti, MD    END OF SESSION:   PT End of Session - 02/23/22 1018     Visit Number 15    Number of Visits 28    Date for PT Re-Evaluation 04/07/22    Authorization Type Medicare/BCBS    Progress Note Due on Visit 18    PT Start Time 6073    PT Stop Time 1055    PT Time Calculation (min) 40 min    Activity Tolerance Patient tolerated treatment well    Behavior During Therapy WFL for tasks assessed/performed                   Past Medical History:  Diagnosis Date   Arthritis    Cataract    Chronic kidney disease    Depression    Hyperlipidemia    Hypertension    Osteoporosis    Past Surgical History:  Procedure Laterality Date   ABDOMINAL HYSTERECTOMY     BREAST REDUCTION SURGERY     and lift   BREAST REDUCTION SURGERY Bilateral    CARPAL TUNNEL RELEASE  06/2009   right   CATARACT EXTRACTION     CHOLECYSTECTOMY     FACIAL COSMETIC SURGERY  03/2008   KNEE ARTHROSCOPY  2007   left & right   KNEE CLOSED REDUCTION Right 02/09/2022   Procedure: CLOSED MANIPULATION RIGHT TOTAL KNEE ARTHROPLASTY;  Surgeon: Mcarthur Rossetti, MD;  Location: [place];  Service: Orthopedics;  Laterality: Right;   LIPOSUCTION EXTREMITIES     thighs   PARTIAL KNEE ARTHROPLASTY Right 10/02/2014   Procedure: RIGHT KNEE MEDIAL UNICOMPARTMENTAL ARTHROPLASTY;  Surgeon: Mcarthur Rossetti, MD;  Location: [facility];  Service: Orthopedics;  Laterality: Right;   REDUCTION MAMMAPLASTY     bilateral   TOTAL KNEE REVISION  10/12/2011   Procedure: TOTAL KNEE REVISION;  Surgeon: Mcarthur Rossetti, MD;  Location: [facility];  Service: Orthopedics;  Laterality: Left;  Left Total Knee Revision Arthroplasty   TOTAL KNEE REVISION Right 12/09/2021   Procedure: CONVERT RIGHT UNI KNEE  ARTHROPLASTY TO TOTAL KNEE ARTHROPLASTY;  Surgeon: Mcarthur Rossetti, MD;  Location: [facility];  Service: Orthopedics;  Laterality: Right;   TRIGGER FINGER RELEASE  06/2009   right 4th finger   Patient Active Problem List   Diagnosis Date Noted   Arthrofibrosis of right total knee arthroplasty ([place]) 02/09/2022   Status post revision of total knee, right 12/09/2021   Arthritis of right knee 12/08/2021   Chronic left-sided low back pain with left-sided sciatica 12/17/2019   Diabetes mellitus type 2 in obese ([place]) 12/13/2019   Chronic rhinitis 03/03/2019   Constipation 10/31/2016   Impaired glucose tolerance 10/31/2016   Stage III chronic kidney disease ([place]) 10/31/2016   Urge urinary incontinence 10/31/2016   Status post right partial knee replacement 10/02/2014   Anxiety and depression 11/06/2011   Hypertension 10/13/2010   Hyperlipidemia 10/13/2010   Osteoarthritis 10/13/2010   UNSPECIFIED IRON DEFICIENCY ANEMIA 04/11/2010   DIARRHEA 04/11/2010    REFERRING DIAG: X10.626 (ICD-10-CM) - Status post revision of total knee, right   THERAPY DIAG:  Acute pain of right knee  Difficulty in walking, not elsewhere classified  Stiffness of right knee, not elsewhere classified  Localized edema  Rationale for Evaluation and Treatment Rehabilitation  PERTINENT HISTORY:  Rt TKA  12/09/21            Rt knee surgery: uni knee arthroplasty injection 09/16/2020   hyperlipidemia, depression, HTN, osteoporosis  PRECAUTIONS:  none  SUBJECTIVE:                                                                                                                                                                                      SUBJECTIVE STATEMENT:  Doing well; still working to get her knee a functional as possible.  Bending her knee is still impacting her function.  PAIN:  NPRS scale: 3/10 Pain location: Rt knee  Pain description: throbbing  Aggravating factors: just trying to work knee   Relieving factors: heat, massage, ice    OBJECTIVE: (objective measures completed at initial evaluation unless otherwise dated)  DIAGNOSTIC FINDINGS: 12/09/21 IMPRESSION: 1. Right total knee arthroplasty revision without acute postoperative complication.images following TKA    PATIENT SURVEYS:  02/10/2022: FOTO 53 (was 46, Goal 56)  01/09/22: FOTO intake:  46%   COGNITION: Overall cognitive status: WFL                  SENSATION: WFL:    EDEMA:  01/09/22: Circumferential: Rt:  50 centimeters                           Left: 46 centimeters MUSCLE LENGTH: 01/09/22: Hamstrings: Right 75 deg; Left 84 deg     POSTURE: rounded shoulders and forward head   PALPATION: 01/09/22: TTP: both sides of incision site on Rt knee    LOWER EXTREMITY ROM:    ROM A: active,  P: passive Right 01/09/22 Left 01/09/22 Rt 01/11/22 Right 01/20/22 Right 02/10/2022 Post-manipulation R 02/15/22 Right 02/16/2022 Right 1/8  Hip flexion            Knee flexion A: 76 P: 80 A: 94 A: 76 P: 84 A: 80 P: 86 Active 78 Active seated 65*; supine 70* Passive supine 80 AAROM 82* seated   Knee extension A: -10 P: -8 A: -2 A: -8 P: -6 A (LAQ): -5 Active -2 Active seated 22*; supine 11* Active supine 0 degrees Active long sitting 5*    (Blank rows = not tested)   LOWER EXTREMITY MMT:   MMT Right 01/09/22 Left 01/09/22  Hip flexion 4-/5 4-/5  Hip extension      Hip abduction 4/5 4/5  Hip adduction 4/5 4/5  Hip internal rotation      Hip external rotation      Knee flexion 3+/5 5/5  Knee extension 3+/5 5/5   (Blank rows = not tested)  FUNCTIONAL TESTS:  01/09/22: 5 time sit to stand: 17 seconds c UE support   GAIT: Distance walked: 20 feet Assistive device utilized: Single point cane Level of assistance: Modified independence Comments: forward  trunk flexion, antalgic gait pattern, increased knee flexion on Rt     TODAY'S TREATMENT  02/23/22 TherEx Scifit bike 8 minutes seat 11 full  rotations; L1 Leg Press 3x10; 75# bil; Rt LE only: 43# 3 x 10 Slant board stretch 3x30 sec Calf Raises x 20 reps Rt foot on 6" step 10 x 10 sec flexion holds Forward step ups onto 6" step 2x10 with RLE Rt knee extension on 6" step 10 x 10 sec hold  02/22/22  TherEx  Scifit bike 7 minutes seat 11 full rotations Self knee flexion stretch x15 with 10 second holds  Standing quad and hip flexor stretch with 6 inch box 3x30 seconds Forward step ups 4 inch box x10 B Lateral step ups 4 inch box x10 B Knee flexion AAROM with overpressure from PT x10  02/21/22:  TherEx:  Scifit bike: Level 1: x 8 minutes Leg Press: bil LE's 75# 2 x 15 Leg Press: Rt LE only: 43# 2 x 10 Lunges on 6 inch step with Rt LE on top x 10 holding 5-10 seconds Seated Hamstring curls Level 3 band 2 x 10  Supine in modified Thomas stretch position with quad stretch using strap x 10 holding 10 seconds Manual:  STM to Rt quad Percussor to Rt quad x 4 minutes PROM in supine flexion and extension Rt knee PROM in sitting with IR and distraction for flexion of Rt knee Contract/relax x 6 holding 10 seconds each c contralateral leg performing opposite motion  Self Care:  Pt edu on sleeping/napping in her bed instead of recliner chair. Pt also edu in pillow placement to proper under calves to help keep knees straight.     02/20/22 TherEx  Scifit bike seat 12 x6 minutes Self knee flexion stretches on 6 inch step 10x10 second holds  HS curls with 5 second holds x10 STS (no UEs) cues for WB onto R LE + eccentric lower for quad x15   Manual  Patella mobs grade III all directions  Retrograde massage LE elevated Knee flexion AAROM with  overpressure x10 edge of mat table    PATIENT EDUCATION:  Education details: ROM measures/progress with PT  Person educated: Patient Education method: Consulting civil engineer, Media planner, Verbal cues, and Handouts Education comprehension: verbalized understanding, returned demonstration, and  verbal cues required   HOME EXERCISE PROGRAM: Access Code: [email or web address] URL: [email or web address] Date: 02/08/2022 Prepared by: Vista Mink  Exercises - Sit to Stand  - 3 x daily - 7 x weekly - 2 sets - 10 reps - Seated Long Arc Quad  - 3 x daily - 7 x weekly - 2 sets - 10 reps - 5 seconds hold - Supine Heel Slide with Strap  - 3 x daily - 7 x weekly - 2 sets - 10 reps - Supine Straight Leg Raises  - 3 x daily - 7 x weekly - 2 sets - 10 reps - Supine Quad Set  - 3 x daily - 7 x weekly - 2 sets - 10 reps - 5 seconds hold - Supine Quadricep Sets  - 5 x daily - 7 x weekly - 2 sets - 10 reps - 5 second hold - Seated Knee Flexion AAROM  - 5 x daily - 7 x weekly - 1 sets - 1 reps - 3  minutes hold     ASSESSMENT:   CLINICAL IMPRESSION:  Pt tolerated session well today with continued difficulty with ROM.  Plan to continue to maximize functional ROM and activities.  Will continue to benefit from PT to maximize function.  OBJECTIVE IMPAIRMENTS: Abnormal gait, decreased activity tolerance, decreased balance, decreased mobility, difficulty walking, decreased ROM, decreased strength, increased edema, impaired flexibility, and pain.    ACTIVITY LIMITATIONS: lifting, bending, sitting, standing, squatting, sleeping, stairs, and transfers   PARTICIPATION LIMITATIONS: cleaning, driving, and community activity   PERSONAL FACTORS: 3+ comorbidities: see above  are also affecting patient's functional outcome.    REHAB POTENTIAL: Good   CLINICAL DECISION MAKING: Stable/uncomplicated   EVALUATION COMPLEXITY: Low     GOALS: Goals reviewed with patient? Yes   SHORT TERM GOALS: (target date for Short term goals are 3 weeks 02/03/22)    1.  Patient will demonstrate independent use of home exercise program to maintain progress from in clinic treatments.   Goal status: Met 02/16/2022   2.  Pt will be able to perform 5 time sit to stand in </= 12 seconds c/s UE support.                          Goal status: On going 02/16/2022   LONG TERM GOALS: (target dates for all long term goals are 12 weeks  04/07/22 )   1. Patient will demonstrate/report pain at worst less than or equal to 2/10 to facilitate minimal limitation in daily activity secondary to pain symptoms.   Goal status: On Going 02/16/2022   2. Patient will demonstrate independent use of home exercise program to facilitate ability to maintain/progress functional gains from skilled physical therapy services.   Goal status: On Going 02/16/2022   3. Patient will demonstrate FOTO outcome > or = 56 % to indicate reduced disability due to condition.   Goal status: On Going 02/10/2022   4.  Patient will demonstrate Rt  LE MMT >/= 4+/5 throughout to faciltiate usual transfers, stairs, squatting at [facility] for daily life.  Goal status: On Going 02/10/2022   5.  Patient will be able to amb > 800 feet with LRAD with normalized gait pattern on community surfaces.    Goal status: On Going 02/16/2022   6.  Pt will be able to navigate up and down 5 stairs with single hand rail with step over step pattern.    Goal status: On Going 02/16/2022   7. Pt will improve her Rt knee flexion to >/= 100 degrees for improved functional mobility.              Goal status: On Going 02/16/2022    PLAN:   PT FREQUENCY: Daily for 2 weeks then likely 2-3X/week for 2 weeks   PT DURATION: 12 weeks   PLANNED INTERVENTIONS: Therapeutic exercises, Therapeutic activity, Neuro Muscular re-education, Balance training, Gait training, Patient/Family education, Joint mobilization, Stair training, DME instructions, Dry Needling, Electrical stimulation, Traction, Cryotherapy, vasopneumatic deviceMoist heat, Taping, Ultrasound, Ionotophoresis '4mg'$ /ml Dexamethasone, and Manual therapy.  All included unless contraindicated   PLAN FOR NEXT SESSION:  functional activities to work on ROM,  Progressive quadriceps strengthening and focus on flexion AROM.    Laureen Abrahams, PT, DPT 02/23/22 10:59 AM

## 2022-02-24 ENCOUNTER — Encounter: Payer: Self-pay | Admitting: Physical Therapy

## 2022-02-24 ENCOUNTER — Ambulatory Visit (INDEPENDENT_AMBULATORY_CARE_PROVIDER_SITE_OTHER): Payer: Medicare Other | Admitting: Physical Therapy

## 2022-02-24 DIAGNOSIS — M25661 Stiffness of right knee, not elsewhere classified: Secondary | ICD-10-CM | POA: Diagnosis not present

## 2022-02-24 DIAGNOSIS — M25561 Pain in right knee: Secondary | ICD-10-CM

## 2022-02-24 DIAGNOSIS — R262 Difficulty in walking, not elsewhere classified: Secondary | ICD-10-CM

## 2022-02-24 DIAGNOSIS — R600 Localized edema: Secondary | ICD-10-CM

## 2022-02-24 NOTE — Therapy (Signed)
OUTPATIENT PHYSICAL THERAPY TREATMENT   Patient Name: Kelly [name] MRN: [id number] DOB:1945/09/28, 77 y.o., female Today's Date: 02/24/2022  PCP: Elby Showers MD  REFERRING PROVIDER: Mcarthur Rossetti, MD    END OF SESSION:   PT End of Session - 02/24/22 0804     Visit Number 16    Number of Visits 28    Date for PT Re-Evaluation 04/07/22    Authorization Type Medicare/BCBS    Progress Note Due on Visit 18    PT Start Time 0801    PT Stop Time 5027    PT Time Calculation (min) 42 min    Activity Tolerance Patient tolerated treatment well    Behavior During Therapy [facility] for tasks assessed/performed                    Past Medical History:  Diagnosis Date   Arthritis    Cataract    Chronic kidney disease    Depression    Hyperlipidemia    Hypertension    Osteoporosis    Past Surgical History:  Procedure Laterality Date   ABDOMINAL HYSTERECTOMY     BREAST REDUCTION SURGERY     and lift   BREAST REDUCTION SURGERY Bilateral    CARPAL TUNNEL RELEASE  06/2009   right   CATARACT EXTRACTION     CHOLECYSTECTOMY     FACIAL COSMETIC SURGERY  03/2008   KNEE ARTHROSCOPY  2007   left & right   KNEE CLOSED REDUCTION Right 02/09/2022   Procedure: CLOSED MANIPULATION RIGHT TOTAL KNEE ARTHROPLASTY;  Surgeon: Mcarthur Rossetti, MD;  Location: [place];  Service: Orthopedics;  Laterality: Right;   LIPOSUCTION EXTREMITIES     thighs   PARTIAL KNEE ARTHROPLASTY Right 10/02/2014   Procedure: RIGHT KNEE MEDIAL UNICOMPARTMENTAL ARTHROPLASTY;  Surgeon: Mcarthur Rossetti, MD;  Location[facility];  Service: Orthopedics;  Laterality: Right;   REDUCTION MAMMAPLASTY     bilateral   TOTAL KNEE REVISION  10/12/2011   Procedure: TOTAL KNEE REVISION;  Surgeon: Mcarthur Rossetti, MD;  Location: [facility];  Service: Orthopedics;  Laterality: Left;  Left Total Knee Revision Arthroplasty   TOTAL KNEE REVISION Right 12/09/2021   Procedure: CONVERT RIGHT UNI KNEE  ARTHROPLASTY TO TOTAL KNEE ARTHROPLASTY;  Surgeon: Mcarthur Rossetti, MD;  Location[facility];  Service: Orthopedics;  Laterality: Right;   TRIGGER FINGER RELEASE  06/2009   right 4th finger   Patient Active Problem List   Diagnosis Date Noted   Arthrofibrosis of right total knee arthroplasty ([place]) 02/09/2022   Status post revision of total knee, right 12/09/2021   Arthritis of right knee 12/08/2021   Chronic left-sided low back pain with left-sided sciatica 12/17/2019   Diabetes mellitus type 2 in obese ([place]) 12/13/2019   Chronic rhinitis 03/03/2019   Constipation 10/31/2016   Impaired glucose tolerance 10/31/2016   Stage III chronic kidney disease ([place]) 10/31/2016   Urge urinary incontinence 10/31/2016   Status post right partial knee replacement 10/02/2014   Anxiety and depression 11/06/2011   Hypertension 10/13/2010   Hyperlipidemia 10/13/2010   Osteoarthritis 10/13/2010   UNSPECIFIED IRON DEFICIENCY ANEMIA 04/11/2010   DIARRHEA 04/11/2010    REFERRING DIAG: X41.287 (ICD-10-CM) - Status post revision of total knee, right   THERAPY DIAG:  Acute pain of right knee  Difficulty in walking, not elsewhere classified  Stiffness of right knee, not elsewhere classified  Localized edema  Rationale for Evaluation and Treatment Rehabilitation  PERTINENT HISTORY:  Rt  TKA 12/09/21 Rt knee surgery: uni knee arthroplasty injection 09/16/2020   hyperlipidemia, depression, HTN, osteoporosis  PRECAUTIONS:  none  SUBJECTIVE:                                                                                                                                                                                      SUBJECTIVE STATEMENT:  Having increased throbbing pain today; did a lot of activity at home and cold weather has been impacting it; also sleeping in the bed has been uncomfortable  PAIN:  NPRS scale: 4-5/10 Pain location: Rt knee  Pain description: throbbing  Aggravating  factors: just trying to work knee  Relieving factors: heat, massage, ice    OBJECTIVE: (objective measures completed at initial evaluation unless otherwise dated)  DIAGNOSTIC FINDINGS: 12/09/21 IMPRESSION: 1. Right total knee arthroplasty revision without acute postoperative complication.images following TKA    PATIENT SURVEYS:  02/10/2022: FOTO 53 (was 46, Goal 56)  01/09/22: FOTO intake:  46%   COGNITION: Overall cognitive status: WFL                  SENSATION: WFL:    EDEMA:  01/09/22: Circumferential: Rt:  50 centimeters                           Left: 46 centimeters MUSCLE LENGTH: 01/09/22: Hamstrings: Right 75 deg; Left 84 deg     POSTURE: rounded shoulders and forward head   PALPATION: 01/09/22: TTP: both sides of incision site on Rt knee    LOWER EXTREMITY ROM:    ROM A: active,  P: passive Right 01/09/22 Left 01/09/22 Rt 01/11/22 Right 01/20/22 Right 02/10/2022 Post-manipulation R 02/15/22 Right 02/16/2022 Right 1/8 Right 02/24/22  Hip flexion             Knee flexion A: 76 P: 80 A: 94 A: 76 P: 84 A: 80 P: 86 Active 78 Active seated 65*; supine 70* Passive supine 80 AAROM 82* seated  A: 77 Supine  Knee extension A: -10 P: -8 A: -2 A: -8 P: -6 A (LAQ): -5 Active -2 Active seated 22*; supine 11* Active supine 0 degrees Active long sitting 5*     (Blank rows = not tested)   LOWER EXTREMITY MMT:   MMT Right 01/09/22 Left 01/09/22  Hip flexion 4-/5 4-/5  Hip extension      Hip abduction 4/5 4/5  Hip adduction 4/5 4/5  Hip internal rotation      Hip external rotation      Knee flexion 3+/5 5/5  Knee extension 3+/5 5/5   (Blank rows = not tested)  FUNCTIONAL TESTS:  01/09/22: 5 time sit to stand: 17 seconds c UE support   GAIT: Distance walked: 20 feet Assistive device utilized: Single point cane Level of assistance: Modified independence Comments: forward  trunk flexion, antalgic gait pattern, increased knee flexion on Rt     TODAY'S  TREATMENT  02/24/22 TherEx Scifit bike 8 minutes seat 11 full rotations; L1 Seated AA knee flexion on Rt; LLE providing overpressure 10 x 10 sec hold Seated LAQ alternating with 4# on RLE 2x10; 3 sec hold AA heel slides with strap on Rt x 10 reps  Manual IASTM with hypervolt to Rt quad x 3 min  Modalities Vaso x 10 min; mod pressure with 34 deg; Rt knee  02/23/22 TherEx Scifit bike 8 minutes seat 11 full rotations; L1 Leg Press 3x10; 75# bil; Rt LE only: 43# 3 x 10 Slant board stretch 3x30 sec Calf Raises x 20 reps Rt foot on 6" step 10 x 10 sec flexion holds Forward step ups onto 6" step 2x10 with RLE Rt knee extension on 6" step 10 x 10 sec hold  02/22/22  TherEx  Scifit bike 7 minutes seat 11 full rotations Self knee flexion stretch x15 with 10 second holds  Standing quad and hip flexor stretch with 6 inch box 3x30 seconds Forward step ups 4 inch box x10 B Lateral step ups 4 inch box x10 B Knee flexion AAROM with overpressure from PT x10  02/21/22:  TherEx:  Scifit bike: Level 1: x 8 minutes Leg Press: bil LE's 75# 2 x 15 Leg Press: Rt LE only: 43# 2 x 10 Lunges on 6 inch step with Rt LE on top x 10 holding 5-10 seconds Seated Hamstring curls Level 3 band 2 x 10  Supine in modified Thomas stretch position with quad stretch using strap x 10 holding 10 seconds Manual:  STM to Rt quad Percussor to Rt quad x 4 minutes PROM in supine flexion and extension Rt knee PROM in sitting with IR and distraction for flexion of Rt knee Contract/relax x 6 holding 10 seconds each c contralateral leg performing opposite motion  Self Care:  Pt edu on sleeping/napping in her bed instead of recliner chair. Pt also edu in pillow placement to proper under calves to help keep knees straight.     02/20/22 TherEx  Scifit bike seat 12 x6 minutes Self knee flexion stretches on 6 inch step 10x10 second holds  HS curls with 5 second holds x10 STS (no UEs) cues for WB onto R LE + eccentric  lower for quad x15   Manual  Patella mobs grade III all directions  Retrograde massage LE elevated Knee flexion AAROM with  overpressure x10 edge of mat table    PATIENT EDUCATION:  Education details: ROM measures/progress with PT  Person educated: Patient Education method: Consulting civil engineer, Media planner, Verbal cues, and Handouts Education comprehension: verbalized understanding, returned demonstration, and verbal cues required   HOME EXERCISE PROGRAM: Access Code: [email or web address] URL: [email or web address] Date: 02/08/2022 Prepared by: Vista Mink  Exercises - Sit to Stand  - 3 x daily - 7 x weekly - 2 sets - 10 reps - Seated Long Arc Quad  - 3 x daily - 7 x weekly - 2 sets - 10 reps - 5 seconds hold - Supine Heel Slide with Strap  - 3 x daily - 7 x weekly - 2 sets - 10 reps - Supine Straight Leg Raises  - 3 x daily - 7 x weekly - 2 sets -  10 reps - Supine Quad Set  - 3 x daily - 7 x weekly - 2 sets - 10 reps - 5 seconds hold - Supine Quadricep Sets  - 5 x daily - 7 x weekly - 2 sets - 10 reps - 5 second hold - Seated Knee Flexion AAROM  - 5 x daily - 7 x weekly - 1 sets - 1 reps - 3 minutes hold     ASSESSMENT:   CLINICAL IMPRESSION:  Pt arriving with increased pain today so lighter session performed.  AROM slightly decreased but likely due to elevated pain.  Will continue to benefit from PT to maximize function.  OBJECTIVE IMPAIRMENTS: Abnormal gait, decreased activity tolerance, decreased balance, decreased mobility, difficulty walking, decreased ROM, decreased strength, increased edema, impaired flexibility, and pain.    ACTIVITY LIMITATIONS: lifting, bending, sitting, standing, squatting, sleeping, stairs, and transfers   PARTICIPATION LIMITATIONS: cleaning, driving, and community activity   PERSONAL FACTORS: 3+ comorbidities: see above  are also affecting patient's functional outcome.    REHAB POTENTIAL: Good   CLINICAL DECISION MAKING:  Stable/uncomplicated   EVALUATION COMPLEXITY: Low     GOALS: Goals reviewed with patient? Yes   SHORT TERM GOALS: (target date for Short term goals are 3 weeks 02/03/22)    1.  Patient will demonstrate independent use of home exercise program to maintain progress from in clinic treatments.   Goal status: Met 02/16/2022   2.  Pt will be able to perform 5 time sit to stand in </= 12 seconds c/s UE support.                         Goal status: On going 02/16/2022   LONG TERM GOALS: (target dates for all long term goals are 12 weeks  04/07/22 )   1. Patient will demonstrate/report pain at worst less than or equal to 2/10 to facilitate minimal limitation in daily activity secondary to pain symptoms.   Goal status: On Going 02/16/2022   2. Patient will demonstrate independent use of home exercise program to facilitate ability to maintain/progress functional gains from skilled physical therapy services.   Goal status: On Going 02/16/2022   3. Patient will demonstrate FOTO outcome > or = 56 % to indicate reduced disability due to condition.   Goal status: On Going 02/10/2022   4.  Patient will demonstrate Rt  LE MMT >/= 4+/5 throughout to faciltiate usual transfers, stairs, squatting at [facility] for daily life.  Goal status: On Going 02/10/2022   5.  Patient will be able to amb > 800 feet with LRAD with normalized gait pattern on community surfaces.    Goal status: On Going 02/16/2022   6.  Pt will be able to navigate up and down 5 stairs with single hand rail with step over step pattern.    Goal status: On Going 02/16/2022   7. Pt will improve her Rt knee flexion to >/= 100 degrees for improved functional mobility.              Goal status: On Going 02/16/2022    PLAN:   PT FREQUENCY: Daily for 2 weeks then likely 2-3X/week for 2 weeks   PT DURATION: 12 weeks   PLANNED INTERVENTIONS: Therapeutic exercises, Therapeutic activity, Neuro Muscular re-education, Balance training, Gait training,  Patient/Family education, Joint mobilization, Stair training, DME instructions, Dry Needling, Electrical stimulation, Traction, Cryotherapy, vasopneumatic deviceMoist heat, Taping, Ultrasound, Ionotophoresis '4mg'$ /ml Dexamethasone, and Manual therapy.  All  included unless contraindicated   PLAN FOR NEXT SESSION:  functional activities to work on ROM,  Progressive quadriceps strengthening and focus on flexion AROM.    Laureen Abrahams, PT, DPT 02/24/22 8:38 AM

## 2022-02-27 ENCOUNTER — Ambulatory Visit (INDEPENDENT_AMBULATORY_CARE_PROVIDER_SITE_OTHER): Payer: Medicare Other | Admitting: Rehabilitative and Restorative Service Providers"

## 2022-02-27 ENCOUNTER — Encounter: Payer: Self-pay | Admitting: Rehabilitative and Restorative Service Providers"

## 2022-02-27 DIAGNOSIS — R262 Difficulty in walking, not elsewhere classified: Secondary | ICD-10-CM

## 2022-02-27 DIAGNOSIS — M25661 Stiffness of right knee, not elsewhere classified: Secondary | ICD-10-CM

## 2022-02-27 DIAGNOSIS — M25561 Pain in right knee: Secondary | ICD-10-CM | POA: Diagnosis not present

## 2022-02-27 DIAGNOSIS — R600 Localized edema: Secondary | ICD-10-CM | POA: Diagnosis not present

## 2022-02-27 NOTE — Therapy (Signed)
OUTPATIENT PHYSICAL THERAPY TREATMENT   Patient Name: Kelly [name] MRN: [id number] DOB:[birth year], 77 y.o., female Today's Date: 02/27/2022  PCP: Elby Showers MD  REFERRING PROVIDER: Mcarthur Rossetti, MD    END OF SESSION:   PT End of Session - 02/27/22 1405     Visit Number 17    Number of Visits 28    Date for PT Re-Evaluation 04/07/22    Authorization Type Medicare/BCBS    Progress Note Due on Visit 18    PT Start Time 1342    PT Stop Time 1431    PT Time Calculation (min) 49 min    Activity Tolerance Patient tolerated treatment well;Patient limited by pain    Behavior During Therapy [facility] for tasks assessed/performed                     Past Medical History:  Diagnosis Date   Arthritis    Cataract    Chronic kidney disease    Depression    Hyperlipidemia    Hypertension    Osteoporosis    Past Surgical History:  Procedure Laterality Date   ABDOMINAL HYSTERECTOMY     BREAST REDUCTION SURGERY     and lift   BREAST REDUCTION SURGERY Bilateral    CARPAL TUNNEL RELEASE  06/2009   right   CATARACT EXTRACTION     CHOLECYSTECTOMY     FACIAL COSMETIC SURGERY  03/2008   KNEE ARTHROSCOPY  2007   left & right   KNEE CLOSED REDUCTION Right 02/09/2022   Procedure: CLOSED MANIPULATION RIGHT TOTAL KNEE ARTHROPLASTY;  Surgeon: Mcarthur Rossetti, MD;  Location: [place];  Service: Orthopedics;  Laterality: Right;   LIPOSUCTION EXTREMITIES     thighs   PARTIAL KNEE ARTHROPLASTY Right 10/02/2014   Procedure: RIGHT KNEE MEDIAL UNICOMPARTMENTAL ARTHROPLASTY;  Surgeon: Mcarthur Rossetti, MD;  Location: [facility];  Service: Orthopedics;  Laterality: Right;   REDUCTION MAMMAPLASTY     bilateral   TOTAL KNEE REVISION  10/12/2011   Procedure: TOTAL KNEE REVISION;  Surgeon: Mcarthur Rossetti, MD;  Location: [facility];  Service: Orthopedics;  Laterality: Left;  Left Total Knee Revision Arthroplasty   TOTAL KNEE REVISION Right 12/09/2021   Procedure:  CONVERT RIGHT UNI KNEE ARTHROPLASTY TO TOTAL KNEE ARTHROPLASTY;  Surgeon: Mcarthur Rossetti, MD;  Location: [facility];  Service: Orthopedics;  Laterality: Right;   TRIGGER FINGER RELEASE  06/2009   right 4th finger   Patient Active Problem List   Diagnosis Date Noted   Arthrofibrosis of right total knee arthroplasty ([place]) 02/09/2022   Status post revision of total knee, right 12/09/2021   Arthritis of right knee 12/08/2021   Chronic left-sided low back pain with left-sided sciatica 12/17/2019   Diabetes mellitus type 2 in obese ([place]) 12/13/2019   Chronic rhinitis 03/03/2019   Constipation 10/31/2016   Impaired glucose tolerance 10/31/2016   Stage III chronic kidney disease ([place]) 10/31/2016   Urge urinary incontinence 10/31/2016   Status post right partial knee replacement 10/02/2014   Anxiety and depression 11/06/2011   Hypertension 10/13/2010   Hyperlipidemia 10/13/2010   Osteoarthritis 10/13/2010   UNSPECIFIED IRON DEFICIENCY ANEMIA 04/11/2010   DIARRHEA 04/11/2010    REFERRING DIAG: J24.268 (ICD-10-CM) - Status post revision of total knee, right   THERAPY DIAG:  Acute pain of right knee  Difficulty in walking, not elsewhere classified  Stiffness of right knee, not elsewhere classified  Localized edema  Rationale for Evaluation and Treatment Rehabilitation  PERTINENT HISTORY:  Rt TKA 12/09/21 Rt knee surgery: uni knee arthroplasty injection 09/16/2020   hyperlipidemia, depression, HTN, osteoporosis  PRECAUTIONS:  none  SUBJECTIVE:                                                                                                                                                                                      SUBJECTIVE STATEMENT:  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  She reported moving a little bit better with not quiet as much pain.    PAIN:  NPRS scale: 2-3/10 Pain location: Rt knee  Pain description: throbbing  Aggravating factors:  just trying to work knee  Relieving factors: heat, massage, ice    OBJECTIVE: (objective measures completed at initial evaluation unless otherwise dated)  DIAGNOSTIC FINDINGS: 12/09/21 IMPRESSION: 1. Right total knee arthroplasty revision without acute postoperative complication.images following TKA    PATIENT SURVEYS:  02/10/2022: FOTO 53 (was 46, Goal 56)  01/09/22: FOTO intake:  46%   COGNITION: Overall cognitive status: WFL                  SENSATION: WFL:    EDEMA:  01/09/22: Circumferential: Rt:  50 centimeters                           Left: 46 centimeters MUSCLE LENGTH: 01/09/22: Hamstrings: Right 75 deg; Left 84 deg     POSTURE: rounded shoulders and forward head   PALPATION: 01/09/22: TTP: both sides of incision site on Rt knee    LOWER EXTREMITY ROM:    ROM A: active,  P: passive Right 01/09/22 Left 01/09/22 Rt 01/11/22 Right 01/20/22 Right 02/10/2022 Post-manipulation R 02/15/22 Right 02/16/2022 Right 1/8 Right 02/24/22  Hip flexion             Knee flexion A: 76 P: 80 A: 94 A: 76 P: 84 A: 80 P: 86 Active 78 Active seated 65*; supine 70* Passive supine 80 AAROM 82* seated  A: 77 Supine  Knee extension A: -10 P: -8 A: -2 A: -8 P: -6 A (LAQ): -5 Active -2 Active seated 22*; supine 11* Active supine 0 degrees Active long sitting 5*     (Blank rows = not tested)   LOWER EXTREMITY MMT:   MMT Right 01/09/22 Left 01/09/22 Right 02/27/2022  Hip flexion 4-/5 4-/5   Hip extension       Hip abduction 4/5 4/5   Hip adduction 4/5 4/5   Hip internal rotation       Hip external rotation       Knee flexion 3+/5 5/5 5/5  Knee extension 3+/5 5/5 5/5   (Blank rows = not tested)     FUNCTIONAL TESTS:  01/09/22: 5 time sit to stand: 17 seconds c UE support   GAIT: 02/27/2022:  Independent ambulation c antalgic gait, lacking TKE in stance,   Eval Distance walked: 20 feet Assistive device utilized: Single point cane Level of assistance: Modified  independence Comments: forward  trunk flexion, antalgic gait pattern, increased knee flexion on Rt     TODAY'S TREATMENT  02/27/2022 TherEx Scifit bike 8 minutes seat 11 full rotations; L1 Seated LAQ alternating with 5# on RLE 2x15; 3 sec hold Leg press Rt leg only 2 x 10 50 lbs c flexion stretch 2-3 seconds each rep.  Incline gastroc stretch 30 sec x 3   Manual Seated Rt knee flexion c distraction/IR mobilization c movement.  Contract/relax techniques for Rt knee flexion.   Modalities Vaso x 10 min; mod pressure with 34 deg; Rt knee  02/24/22 TherEx Scifit bike 8 minutes seat 11 full rotations; L1 Seated AA knee flexion on Rt; LLE providing overpressure 10 x 10 sec hold Seated LAQ alternating with 4# on RLE 2x10; 3 sec hold AA heel slides with strap on Rt x 10 reps  Manual IASTM with hypervolt to Rt quad x 3 min  Modalities Vaso x 10 min; mod pressure with 34 deg; Rt knee  02/23/22 TherEx Scifit bike 8 minutes seat 11 full rotations; L1 Leg Press 3x10; 75# bil; Rt LE only: 43# 3 x 10 Slant board stretch 3x30 sec Calf Raises x 20 reps Rt foot on 6" step 10 x 10 sec flexion holds Forward step ups onto 6" step 2x10 with RLE Rt knee extension on 6" step 10 x 10 sec hold  02/22/22  TherEx  Scifit bike 7 minutes seat 11 full rotations Self knee flexion stretch x15 with 10 second holds  Standing quad and hip flexor stretch with 6 inch box 3x30 seconds Forward step ups 4 inch box x10 B Lateral step ups 4 inch box x10 B Knee flexion AAROM with overpressure from PT x10  02/21/22:  TherEx:  Scifit bike: Level 1: x 8 minutes Leg Press: bil LE's 75# 2 x 15 Leg Press: Rt LE only: 43# 2 x 10 Lunges on 6 inch step with Rt LE on top x 10 holding 5-10 seconds Seated Hamstring curls Level 3 band 2 x 10  Supine in modified Thomas stretch position with quad stretch using strap x 10 holding 10 seconds Manual:  STM to Rt quad Percussor to Rt quad x 4 minutes PROM in supine flexion  and extension Rt knee PROM in sitting with IR and distraction for flexion of Rt knee Contract/relax x 6 holding 10 seconds each c contralateral leg performing opposite motion  Self Care:  Pt edu on sleeping/napping in her bed instead of recliner chair. Pt also edu in pillow placement to proper under calves to help keep knees straight.     PATIENT EDUCATION:  Education details: ROM measures/progress with PT  Person educated: Patient Education method: Consulting civil engineer, Media planner, Verbal cues, and Handouts Education comprehension: verbalized understanding, returned demonstration, and verbal cues required   HOME EXERCISE PROGRAM: Access Code: [email or web address] URL: [email or web address] Date: 02/08/2022 Prepared by: Vista Mink  Exercises - Sit to Stand  - 3 x daily - 7 x weekly - 2 sets - 10 reps - Seated Long Arc Quad  - 3 x daily - 7 x weekly - 2 sets - 10 reps - 5 seconds  hold - Supine Heel Slide with Strap  - 3 x daily - 7 x weekly - 2 sets - 10 reps - Supine Straight Leg Raises  - 3 x daily - 7 x weekly - 2 sets - 10 reps - Supine Quad Set  - 3 x daily - 7 x weekly - 2 sets - 10 reps - 5 seconds hold - Supine Quadricep Sets  - 5 x daily - 7 x weekly - 2 sets - 10 reps - 5 second hold - Seated Knee Flexion AAROM  - 5 x daily - 7 x weekly - 1 sets - 1 reps - 3 minutes hold     ASSESSMENT:   CLINICAL IMPRESSION:  Continued work c focus on improving flexion and extension mobility to promote improved mobility in ambulation and other functional movement in day.   OBJECTIVE IMPAIRMENTS: Abnormal gait, decreased activity tolerance, decreased balance, decreased mobility, difficulty walking, decreased ROM, decreased strength, increased edema, impaired flexibility, and pain.    ACTIVITY LIMITATIONS: lifting, bending, sitting, standing, squatting, sleeping, stairs, and transfers   PARTICIPATION LIMITATIONS: cleaning, driving, and community activity   PERSONAL FACTORS: 3+  comorbidities: see above  are also affecting patient's functional outcome.    REHAB POTENTIAL: Good   CLINICAL DECISION MAKING: Stable/uncomplicated   EVALUATION COMPLEXITY: Low     GOALS: Goals reviewed with patient? Yes   SHORT TERM GOALS: (target date for Short term goals are 3 weeks 02/03/22)    1.  Patient will demonstrate independent use of home exercise program to maintain progress from in clinic treatments.   Goal status: Met 02/16/2022   2.  Pt will be able to perform 5 time sit to stand in </= 12 seconds c/s UE support.                         Goal status: Not met by [facility]   LONG TERM GOALS: (target dates for all long term goals are 12 weeks  04/07/22 )   1. Patient will demonstrate/report pain at worst less than or equal to 2/10 to facilitate minimal limitation in daily activity secondary to pain symptoms.   Goal status: On Going 02/16/2022   2. Patient will demonstrate independent use of home exercise program to facilitate ability to maintain/progress functional gains from skilled physical therapy services.   Goal status: On Going 02/16/2022   3. Patient will demonstrate FOTO outcome > or = 56 % to indicate reduced disability due to condition.   Goal status: On Going 02/10/2022   4.  Patient will demonstrate Rt  LE MMT >/= 4+/5 throughout to faciltiate usual transfers, stairs, squatting at [facility] for daily life.  Goal status: On Going 02/10/2022   5.  Patient will be able to amb > 800 feet with LRAD with normalized gait pattern on community surfaces.    Goal status: On Going 02/16/2022   6.  Pt will be able to navigate up and down 5 stairs with single hand rail with step over step pattern.    Goal status: On Going 02/16/2022   7. Pt will improve her Rt knee flexion to >/= 100 degrees for improved functional mobility.              Goal status: On Going 02/16/2022    PLAN:   PT FREQUENCY: Daily for 2 weeks then likely 2-3X/week for 2 weeks   PT DURATION: 12 weeks    PLANNED INTERVENTIONS: Therapeutic exercises, Therapeutic activity,  Neuro Muscular re-education, Balance training, Gait training, Patient/Family education, Joint mobilization, Stair training, DME instructions, Dry Needling, Electrical stimulation, Traction, Cryotherapy, vasopneumatic deviceMoist heat, Taping, Ultrasound, Ionotophoresis '4mg'$ /ml Dexamethasone, and Manual therapy.  All included unless contraindicated   PLAN FOR NEXT SESSION:  Strong manual focus for mobility gains.    Scot Jun, PT, DPT, OCS, ATC 02/27/22  2:22 PM

## 2022-02-28 DIAGNOSIS — R350 Frequency of micturition: Secondary | ICD-10-CM | POA: Diagnosis not present

## 2022-03-01 ENCOUNTER — Encounter: Payer: Self-pay | Admitting: Physical Therapy

## 2022-03-01 ENCOUNTER — Ambulatory Visit (INDEPENDENT_AMBULATORY_CARE_PROVIDER_SITE_OTHER): Payer: Medicare Other | Admitting: Physical Therapy

## 2022-03-01 DIAGNOSIS — R262 Difficulty in walking, not elsewhere classified: Secondary | ICD-10-CM

## 2022-03-01 DIAGNOSIS — M25661 Stiffness of right knee, not elsewhere classified: Secondary | ICD-10-CM

## 2022-03-01 DIAGNOSIS — R600 Localized edema: Secondary | ICD-10-CM | POA: Diagnosis not present

## 2022-03-01 DIAGNOSIS — M25561 Pain in right knee: Secondary | ICD-10-CM | POA: Diagnosis not present
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
## 2022-03-03 ENCOUNTER — Encounter: Payer: Medicare Other | Admitting: Rehabilitative and Restorative Service Providers"

## 2022-03-03 ENCOUNTER — Telehealth: Payer: Self-pay | Admitting: Rehabilitative and Restorative Service Providers"

## 2022-03-03 NOTE — Telephone Encounter (Signed)
Called Pt.  She indicated she was at ER with her sister.  Will plan on coming Monday.  Scot Jun, PT, DPT, OCS, ATC 03/03/22  8:28 AM

## 2022-03-06 ENCOUNTER — Ambulatory Visit (INDEPENDENT_AMBULATORY_CARE_PROVIDER_SITE_OTHER): Payer: Medicare Other | Admitting: Rehabilitative and Restorative Service Providers"

## 2022-03-06 ENCOUNTER — Other Ambulatory Visit: Payer: Self-pay | Admitting: Internal Medicine

## 2022-03-06 ENCOUNTER — Encounter: Payer: Self-pay | Admitting: Rehabilitative and Restorative Service Providers"

## 2022-03-06 DIAGNOSIS — R262 Difficulty in walking, not elsewhere classified: Secondary | ICD-10-CM | POA: Diagnosis not present

## 2022-03-06 DIAGNOSIS — M25661 Stiffness of right knee, not elsewhere classified: Secondary | ICD-10-CM

## 2022-03-06 DIAGNOSIS — R600 Localized edema: Secondary | ICD-10-CM

## 2022-03-06 DIAGNOSIS — M25561 Pain in right knee: Secondary | ICD-10-CM | POA: Diagnosis not present
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
## 2022-03-08 ENCOUNTER — Ambulatory Visit (INDEPENDENT_AMBULATORY_CARE_PROVIDER_SITE_OTHER): Payer: Medicare Other | Admitting: Rehabilitative and Restorative Service Providers"

## 2022-03-08 ENCOUNTER — Encounter: Payer: Self-pay | Admitting: Rehabilitative and Restorative Service Providers"

## 2022-03-08 DIAGNOSIS — M25561 Pain in right knee: Secondary | ICD-10-CM

## 2022-03-08 DIAGNOSIS — R600 Localized edema: Secondary | ICD-10-CM

## 2022-03-08 DIAGNOSIS — M25661 Stiffness of right knee, not elsewhere classified: Secondary | ICD-10-CM | POA: Diagnosis not present

## 2022-03-08 DIAGNOSIS — R262 Difficulty in walking, not elsewhere classified: Secondary | ICD-10-CM | POA: Diagnosis not present

## 2022-03-08 NOTE — Therapy (Signed)
OUTPATIENT PHYSICAL THERAPY TREATMENT/PROGRESS NOTE   Patient Name: Kelly [name] MRN: [id number] DOB:Apr 24, 1945, 77 y.o., female Today's Date: 03/08/2022  PCP: Elby Showers MD  REFERRING PROVIDER: Mcarthur Rossetti, MD    END OF SESSION:   PT End of Session - 03/08/22 1351     Visit Number 20    Number of Visits 28    Date for PT Re-Evaluation 04/07/22    Authorization Type Medicare/BCBS    Progress Note Due on Visit 30    PT Start Time 2202    PT Stop Time 1436    PT Time Calculation (min) 51 min    Activity Tolerance Patient tolerated treatment well;No increased pain;Patient limited by pain    Behavior During Therapy [place] for tasks assessed/performed             Progress Note Reporting Period 01/09/2022 to 03/08/2022  See note below for Objective Data and Assessment of Progress/Goals.     Past Medical History:  Diagnosis Date   Arthritis    Cataract    Chronic kidney disease    Depression    Hyperlipidemia    Hypertension    Osteoporosis    Past Surgical History:  Procedure Laterality Date   ABDOMINAL HYSTERECTOMY     BREAST REDUCTION SURGERY     and lift   BREAST REDUCTION SURGERY Bilateral    CARPAL TUNNEL RELEASE  06/2009   right   CATARACT EXTRACTION     CHOLECYSTECTOMY     FACIAL COSMETIC SURGERY  03/2008   KNEE ARTHROSCOPY  2007   left & right   KNEE CLOSED REDUCTION Right 02/09/2022   Procedure: CLOSED MANIPULATION RIGHT TOTAL KNEE ARTHROPLASTY;  Surgeon: Mcarthur Rossetti, MD;  Location: [place];  Service: Orthopedics;  Laterality: Right;   LIPOSUCTION EXTREMITIES     thighs   PARTIAL KNEE ARTHROPLASTY Right 10/02/2014   Procedure: RIGHT KNEE MEDIAL UNICOMPARTMENTAL ARTHROPLASTY;  Surgeon: Mcarthur Rossetti, MD;  [facility];  Service: Orthopedics;  Laterality: Right;   REDUCTION MAMMAPLASTY     bilateral   TOTAL KNEE REVISION  10/12/2011   Procedure: TOTAL KNEE REVISION;  Surgeon: Mcarthur Rossetti, MD;   Location[facility];  Service: Orthopedics;  Laterality: Left;  Left Total Knee Revision Arthroplasty   TOTAL KNEE REVISION Right 12/09/2021   Procedure: CONVERT RIGHT UNI KNEE ARTHROPLASTY TO TOTAL KNEE ARTHROPLASTY;  Surgeon: Mcarthur Rossetti, MD;  [facility];  Service: Orthopedics;  Laterality: Right;   TRIGGER FINGER RELEASE  06/2009   right 4th finger   Patient Active Problem List   Diagnosis Date Noted   Arthrofibrosis of right total knee arthroplasty ([place]) 02/09/2022   Status post revision of total knee, right 12/09/2021   Arthritis of right knee 12/08/2021   Chronic left-sided low back pain with left-sided sciatica 12/17/2019   Diabetes mellitus type 2 in obese ([place]) 12/13/2019   Chronic rhinitis 03/03/2019   Constipation 10/31/2016   Impaired glucose tolerance 10/31/2016   Stage III chronic kidney disease ([place]) 10/31/2016   Urge urinary incontinence 10/31/2016   Status post right partial knee replacement 10/02/2014   Anxiety and depression 11/06/2011   Hypertension 10/13/2010   Hyperlipidemia 10/13/2010   Osteoarthritis 10/13/2010   UNSPECIFIED IRON DEFICIENCY ANEMIA 04/11/2010   DIARRHEA 04/11/2010    REFERRING DIAG: R42.706 (ICD-10-CM) - Status post revision of total knee, right   THERAPY DIAG:  Acute pain of right knee  Difficulty in walking, not elsewhere classified  Stiffness  of right knee, not elsewhere classified  Localized edema  Rationale for Evaluation and Treatment Rehabilitation  PERTINENT HISTORY:  Rt TKA 12/09/21 Rt knee surgery: uni knee arthroplasty injection 09/16/2020   hyperlipidemia, depression, HTN, osteoporosis  PRECAUTIONS:  none  SUBJECTIVE:                                                                                                                                                                                      SUBJECTIVE STATEMENT:   [place] sees Dr. Ninfa Linden 03/31/2022.  Overall, she is happy with her progress  although "more bending" would be ideal.  PAIN:  NPRS scale: 1-2/10 this week Pain location: Rt knee  Pain description: throbbing  Aggravating factors: just trying to work knee  Relieving factors: heat, massage, ice    OBJECTIVE: (objective measures completed at initial evaluation unless otherwise dated)  DIAGNOSTIC FINDINGS: 12/09/21 IMPRESSION: 1. Right total knee arthroplasty revision without acute postoperative complication.images following TKA    PATIENT SURVEYS:  03/08/2021: FOTO 53 (was 46, Goal 56)  02/10/2022: FOTO 53 (was 46, Goal 56)  01/09/22: FOTO intake:  46%   COGNITION: Overall cognitive status: WFL                  SENSATION: WFL:    EDEMA:  01/09/22: Circumferential: Rt:  50 centimeters                           Left: 46 centimeters  MUSCLE LENGTH: 01/09/22: Hamstrings: Right 75 deg; Left 84 deg     POSTURE: rounded shoulders and forward head   PALPATION: 01/09/22: TTP: both sides of incision site on Rt knee    LOWER EXTREMITY ROM:    ROM A: active,  P: passive Right 01/09/22 Left 01/09/22 Rt 01/11/22 Right 01/20/22 Right 02/10/2022 Post-manipulation Rt 02/15/22 Right 02/16/2022 Right  02/20/2022 Right 02/24/22 Rt 03/06/2022 Right 03/08/2022  Hip flexion               Knee flexion A: 76 P: 80 A: 94 A: 76 P: 84 A: 80 P: 86 Active 78 Active seated 65*; supine 70* Passive supine 80 AAROM 82* seated  A: 77 Supine AROM heel slide 81 PROM heel slide c strap 86 AROM 83  Knee extension A: -10 P: -8 A: -2 A: -8 P: -6 A (LAQ): -5 Active -2 Active seated 22*; supine 11* Active supine 0 degrees Active long sitting 5*    AROM -5   (Blank rows = not tested)   LOWER EXTREMITY MMT:   MMT Right 01/09/22 Left 01/09/22 Right 02/27/2022 Left/Right in pounds 03/08/2022  Hip flexion  4-/5 4-/5    Hip extension        Hip abduction 4/5 4/5    Hip adduction 4/5 4/5    Hip internal rotation        Hip external rotation        Knee flexion 3+/5 5/5 5/5  44.6/47.2  Knee extension 3+/5 5/5 5/5    (Blank rows = not tested)     FUNCTIONAL TESTS:  01/09/22: 5 time sit to stand: 17 seconds c UE support   GAIT: 02/27/2022:  Independent ambulation c antalgic gait, lacking TKE in stance,   Eval Distance walked: 20 feet Assistive device utilized: Single point cane Level of assistance: Modified independence Comments: forward  trunk flexion, antalgic gait pattern, increased knee flexion on Rt     TODAY'S TREATMENT  03/08/2022 [place] knee flexion 1 minute AAROM right knee flexion 10X 10 seconds Quad sets with right heel prop 10X 5 seconds  Functional Activities for sit to stand and stairs: Double Leg 56# 15X full extension to full flexion, slow eccentrics Single Leg 50# 10X full extension to full flexion, slow eccentrics  Manual flexion with PT overpressure 5 for 10 seconds  Vaso right knee 10 minutes post-exercises 34* Medium Pressure   03/06/2022 TherEx Scifit bike 9 minutes seat 11 full rotations; L1.5 Seated LAQ alternating with 5# on RLE 2x15; 3 sec hold Leg press Double leg 56 lbs x 15, Rt leg only x15 50 lbs c flexion stretch 2-3 seconds each rep.  Incline gastroc stretch 30 sec x 3  Supine heel slide c strap Rt leg 10 sec x 6  Manual Seated Rt knee flexion c distraction/IR mobilization c movement.  Contract/relax techniques for Rt knee flexion.   Modalities Vaso x 10 min; mod pressure with 34 deg; Rt knee   03/01/22 TherEx Scifit bike seat 11 full rotations x6 minutes  R knee flexion stretch on 6 inch step 10x10 second holds HS curls 0# x15 surgical LE  TKE x15  3 second holds standing Forward step ups x10 6inch box  STS x15 no UEs low mat table  Self MET to HS followed by fast LAQ 0# x10  Self MET to quad followed by fast HS curl 0# x10   Manual Patella mobilizations all directions supine Knee extension overpressure supine  Knee flexion overpressure sitting    PATIENT EDUCATION:  Education details: ROM  measures/progress with PT  Person educated: Patient Education method: Consulting civil engineer, Media planner, Verbal cues, and Handouts Education comprehension: verbalized understanding, returned demonstration, and verbal cues required   HOME EXERCISE PROGRAM:  Access Code: [email or web address] URL: [email or web address] Date: 03/08/2022 Prepared by: Vista Mink  Exercises - Sit to Stand  - 3 x daily - 7 x weekly - 2 sets - 10 reps - Seated Long Arc Quad  - 3 x daily - 7 x weekly - 2 sets - 10 reps - 5 seconds hold - Supine Heel Slide with Strap  - 3 x daily - 7 x weekly - 2 sets - 10 reps - Supine Straight Leg Raises  - 3 x daily - 7 x weekly - 2 sets - 10 reps - Supine Quad Set  - 3 x daily - 7 x weekly - 2 sets - 10 reps - 5 seconds hold - Supine Quadricep Sets  - 5 x daily - 7 x weekly - 2 sets - 10 reps - 5 second hold - Seated Knee Flexion AAROM  - 5 x daily - 7 x weekly -  1 sets - 1 reps - 3 minutes hold   ASSESSMENT:   CLINICAL IMPRESSION:  Brett is very happy with her function at this point post-surgery.  Bending is not quite where it was before her supervised PT was interrupted by a Covid delay, but she is getting close to the 90 degrees expected (83 degrees today).  Her work ethic is outstanding and with continued work she will make further gains.  I recommend she continue her current POC with progress note before her 03/31/22 appointment with Dr. Ninfa Linden.  OBJECTIVE IMPAIRMENTS: Abnormal gait, decreased activity tolerance, decreased balance, decreased mobility, difficulty walking, decreased ROM, decreased strength, increased edema, impaired flexibility, and pain.    ACTIVITY LIMITATIONS: lifting, bending, sitting, standing, squatting, sleeping, stairs, and transfers   PARTICIPATION LIMITATIONS: cleaning, driving, and community activity   PERSONAL FACTORS: 3+ comorbidities: see above  are also affecting patient's functional outcome.    REHAB POTENTIAL: Good   CLINICAL DECISION  MAKING: Stable/uncomplicated   EVALUATION COMPLEXITY: Low     GOALS: Goals reviewed with patient? Yes   SHORT TERM GOALS: (target date for Short term goals are 3 weeks 02/03/22)    1.  Patient will demonstrate independent use of home exercise program to maintain progress from in clinic treatments.   Goal status: Met 02/16/2022   2.  Pt will be able to perform 5 time sit to stand in </= 12 seconds c/s UE support.                         Goal status: Met 1/24/204   LONG TERM GOALS: (target dates for all long term goals are 12 weeks  04/07/22 )   1. Patient will demonstrate/report pain at worst less than or equal to 2/10 to facilitate minimal limitation in daily activity secondary to pain symptoms.   Goal status: Met 03/08/2022   2. Patient will demonstrate independent use of home exercise program to facilitate ability to maintain/progress functional gains from skilled physical therapy services.   Goal status: On Going 03/08/2022   3. Patient will demonstrate FOTO outcome > or = 56 % to indicate reduced disability due to condition.   Goal status: On Going 03/08/2022   4.  Patient will demonstrate Rt  LE MMT >/= 4+/5 throughout to faciltiate usual transfers, stairs, squatting at [facility] for daily life.  Goal status: Met 03/08/2022   5.  Patient will be able to amb > 800 feet with LRAD with normalized gait pattern on community surfaces.    Goal status: On Going 03/08/2022   6.  Pt will be able to navigate up and down 5 stairs with single hand rail with step over step pattern.    Goal status: Met 03/08/2022   7. Pt will improve her Rt knee flexion to >/= 100 degrees for improved functional mobility.              Goal status: On Going 03/08/2022    PLAN:   PT FREQUENCY: 2-3X/week for 2 weeks   PT DURATION: 2-3 weeks   PLANNED INTERVENTIONS: Therapeutic exercises, Therapeutic activity, Neuro Muscular re-education, Balance training, Gait training, Patient/Family education, Joint  mobilization, Stair training, DME instructions, Dry Needling, Electrical stimulation, Traction, Cryotherapy, vasopneumatic deviceMoist heat, Taping, Ultrasound, Ionotophoresis '4mg'$ /ml Dexamethasone, and Manual therapy.  All included unless contraindicated   PLAN FOR NEXT SESSION:  Flexion AROM, extension AROM, gait quality and strength in preparation for independent rehabilitation.     Rob Encarnacion Slates  PT, MPT 03/08/22  5:17 PM

## 2022-03-10 ENCOUNTER — Ambulatory Visit (INDEPENDENT_AMBULATORY_CARE_PROVIDER_SITE_OTHER): Payer: Medicare Other | Admitting: Rehabilitative and Restorative Service Providers"

## 2022-03-10 ENCOUNTER — Encounter: Payer: Self-pay | Admitting: Rehabilitative and Restorative Service Providers"

## 2022-03-10 DIAGNOSIS — R262 Difficulty in walking, not elsewhere classified: Secondary | ICD-10-CM

## 2022-03-10 DIAGNOSIS — R600 Localized edema: Secondary | ICD-10-CM

## 2022-03-10 DIAGNOSIS — M25561 Pain in right knee: Secondary | ICD-10-CM | POA: Diagnosis not present

## 2022-03-10 DIAGNOSIS — M25661 Stiffness of right knee, not elsewhere classified: Secondary | ICD-10-CM | POA: Diagnosis not present

## 2022-03-10 NOTE — Therapy (Signed)
OUTPATIENT PHYSICAL THERAPY TREATMENT NOTE   Patient Name: Kelly [name] MRN: [id number] DOB:[birth year], 77 y.o., female Today's Date: 03/10/2022  PCP: Elby Showers MD  REFERRING PROVIDER: Mcarthur Rossetti, MD    END OF SESSION:   PT End of Session - 03/10/22 1110     Visit Number 21    Number of Visits 28    Date for PT Re-Evaluation 04/07/22    Authorization Type Medicare/BCBS    Progress Note Due on Visit 30    PT Start Time 0845    PT Stop Time 0940    PT Time Calculation (min) 55 min    Activity Tolerance Patient tolerated treatment well;No increased pain;Patient limited by pain    Behavior During Therapy [facility] for tasks assessed/performed             Past Medical History:  Diagnosis Date   Arthritis    Cataract    Chronic kidney disease    Depression    Hyperlipidemia    Hypertension    Osteoporosis    Past Surgical History:  Procedure Laterality Date   ABDOMINAL HYSTERECTOMY     BREAST REDUCTION SURGERY     and lift   BREAST REDUCTION SURGERY Bilateral    CARPAL TUNNEL RELEASE  06/2009   right   CATARACT EXTRACTION     CHOLECYSTECTOMY     FACIAL COSMETIC SURGERY  03/2008   KNEE ARTHROSCOPY  2007   left & right   KNEE CLOSED REDUCTION Right 02/09/2022   Procedure: CLOSED MANIPULATION RIGHT TOTAL KNEE ARTHROPLASTY;  Surgeon: Mcarthur Rossetti, MD;  Location: [facility];  Service: Orthopedics;  Laterality: Right;   LIPOSUCTION EXTREMITIES     thighs   PARTIAL KNEE ARTHROPLASTY Right 10/02/2014   Procedure: RIGHT KNEE MEDIAL UNICOMPARTMENTAL ARTHROPLASTY;  Surgeon: Mcarthur Rossetti, MD;  Location: [facility];  Service: Orthopedics;  Laterality: Right;   REDUCTION MAMMAPLASTY     bilateral   TOTAL KNEE REVISION  10/12/2011   Procedure: TOTAL KNEE REVISION;  Surgeon: Mcarthur Rossetti, MD;  Location: [facility];  Service: Orthopedics;  Laterality: Left;  Left Total Knee Revision Arthroplasty   TOTAL KNEE REVISION Right 12/09/2021    Procedure: CONVERT RIGHT UNI KNEE ARTHROPLASTY TO TOTAL KNEE ARTHROPLASTY;  Surgeon: Mcarthur Rossetti, MD;  Location: [facility];  Service: Orthopedics;  Laterality: Right;   TRIGGER FINGER RELEASE  06/2009   right 4th finger   Patient Active Problem List   Diagnosis Date Noted   Arthrofibrosis of right total knee arthroplasty ([place]) 02/09/2022   Status post revision of total knee, right 12/09/2021   Arthritis of right knee 12/08/2021   Chronic left-sided low back pain with left-sided sciatica 12/17/2019   Diabetes mellitus type 2 in obese ([place]) 12/13/2019   Chronic rhinitis 03/03/2019   Constipation 10/31/2016   Impaired glucose tolerance 10/31/2016   Stage III chronic kidney disease ([place]) 10/31/2016   Urge urinary incontinence 10/31/2016   Status post right partial knee replacement 10/02/2014   Anxiety and depression 11/06/2011   Hypertension 10/13/2010   Hyperlipidemia 10/13/2010   Osteoarthritis 10/13/2010   UNSPECIFIED IRON DEFICIENCY ANEMIA 04/11/2010   DIARRHEA 04/11/2010    REFERRING DIAG: K44.010 (ICD-10-CM) - Status post revision of total knee, right   THERAPY DIAG:  Acute pain of right knee  Difficulty in walking, not elsewhere classified  Stiffness of right knee, not elsewhere classified  Localized edema  Rationale for Evaluation and Treatment Rehabilitation  PERTINENT HISTORY:  Rt TKA  12/09/21 Rt knee surgery: uni knee arthroplasty injection 09/16/2020   hyperlipidemia, depression, HTN, osteoporosis  PRECAUTIONS:  none  SUBJECTIVE:                                                                                                                                                                                      SUBJECTIVE STATEMENT:   Kelly [name] sees Dr. Ninfa Linden 03/31/2022.  Flexion AROM remains priority #1 with extension AROM and strength also being addressed.  PAIN:  NPRS scale: 1-2/10 this week Pain location: Rt knee  Pain description: throbbing   Aggravating factors: just trying to work knee  Relieving factors: heat, massage, ice    OBJECTIVE: (objective measures completed at initial evaluation unless otherwise dated)  DIAGNOSTIC FINDINGS: 12/09/21 IMPRESSION: 1. Right total knee arthroplasty revision without acute postoperative complication.images following TKA    PATIENT SURVEYS:  03/08/2021: FOTO 53 (was 46, Goal 56)  02/10/2022: FOTO 53 (was 46, Goal 56)  01/09/22: FOTO intake:  46%   COGNITION: Overall cognitive status: WFL                  SENSATION: WFL:    EDEMA:  01/09/22: Circumferential: Rt:  50 centimeters                           Left: 46 centimeters  MUSCLE LENGTH: 01/09/22: Hamstrings: Right 75 deg; Left 84 deg     POSTURE: rounded shoulders and forward head   PALPATION: 01/09/22: TTP: both sides of incision site on Rt knee    LOWER EXTREMITY ROM:    ROM A: active,  P: passive Right 01/09/22 Left 01/09/22 Rt 01/11/22 Right 01/20/22 Right 02/10/2022 Post-manipulation Rt 02/15/22 Right 02/16/2022 Right  02/20/2022 Right 02/24/22 Rt 03/06/2022 Right 03/08/2022  Hip flexion               Knee flexion A: 76 P: 80 A: 94 A: 76 P: 84 A: 80 P: 86 Active 78 Active seated 65*; supine 70* Passive supine 80 AAROM 82* seated  A: 77 Supine AROM heel slide 81 PROM heel slide c strap 86 AROM 83  Knee extension A: -10 P: -8 A: -2 A: -8 P: -6 A (LAQ): -5 Active -2 Active seated 22*; supine 11* Active supine 0 degrees Active long sitting 5*    AROM -5   (Blank rows = not tested)   LOWER EXTREMITY MMT:   MMT Right 01/09/22 Left 01/09/22 Right 02/27/2022 Left/Right in pounds 03/08/2022  Hip flexion 4-/5 4-/5    Hip extension        Hip abduction 4/5 4/5    Hip  adduction 4/5 4/5    Hip internal rotation        Hip external rotation        Knee flexion 3+/5 5/5 5/5 44.6/47.2  Knee extension 3+/5 5/5 5/5    (Blank rows = not tested)     FUNCTIONAL TESTS:  01/09/22: 5 time sit to stand: 17 seconds  c UE support   GAIT: 02/27/2022:  Independent ambulation c antalgic gait, lacking TKE in stance,   Eval Distance walked: 20 feet Assistive device utilized: Single point cane Level of assistance: Modified independence Comments: forward  trunk flexion, antalgic gait pattern, increased knee flexion on Rt     TODAY'S TREATMENT  03/10/2022 [place] knee flexion 1 minute AAROM right knee flexion 10X 10 seconds with PT overpressure Quad sets with right heel prop 10X 5 seconds Standing knee flexion on 8 inch step in parallel bars (lunge into step with right foot on step) and standing knee extension (same set up) 10X 5 seconds each  Functional Activities for sit to stand and stairs: Double Leg 62# 15X full extension to full flexion, slow eccentrics Single Leg 50# 15X full extension to full flexion, slow eccentrics Step-up and over 4 inch step with focus on avoiding circumduction and avoiding hip ER for increased knee flexion 10X   Vaso right knee 10 minutes post-exercises 34* Medium Pressure   03/08/2022 [place] knee flexion 1 minute AAROM right knee flexion 10X 10 seconds Quad sets with right heel prop 10X 5 seconds  Functional Activities for sit to stand and stairs: Double Leg 56# 15X full extension to full flexion, slow eccentrics Single Leg 50# 10X full extension to full flexion, slow eccentrics  Manual flexion with PT overpressure 5 for 10 seconds  Vaso right knee 10 minutes post-exercises 34* Medium Pressure   03/06/2022 [employer] bike 9 minutes seat 11 full rotations; L1.5 Seated LAQ alternating with 5# on RLE 2x15; 3 sec hold Leg press Double leg 56 lbs x 15, Rt leg only x15 50 lbs c flexion stretch 2-3 seconds each rep.  Incline gastroc stretch 30 sec x 3  Supine heel slide c strap Rt leg 10 sec x 6  Manual Seated Rt knee flexion c distraction/IR mobilization c movement.  Contract/relax techniques for Rt knee flexion.   Modalities Vaso x 10 min; mod pressure with  34 deg; Rt knee   PATIENT EDUCATION:  Education details: ROM measures/progress with PT  Person educated: Patient Education method: Consulting civil engineer, Media planner, Verbal cues, and Handouts Education comprehension: verbalized understanding, returned demonstration, and verbal cues required   HOME EXERCISE PROGRAM:  Access Code: [email or web address] URL: [email or web address] Date: 03/08/2022 Prepared by: Vista Mink  Exercises - Sit to Stand  - 3 x daily - 7 x weekly - 2 sets - 10 reps - Seated Long Arc Quad  - 3 x daily - 7 x weekly - 2 sets - 10 reps - 5 seconds hold - Supine Heel Slide with Strap  - 3 x daily - 7 x weekly - 2 sets - 10 reps - Supine Straight Leg Raises  - 3 x daily - 7 x weekly - 2 sets - 10 reps - Supine Quad Set  - 3 x daily - 7 x weekly - 2 sets - 10 reps - 5 seconds hold - Supine Quadricep Sets  - 5 x daily - 7 x weekly - 2 sets - 10 reps - 5 second hold - Seated Knee Flexion AAROM  - 5 x daily - 7  x weekly - 1 sets - 1 reps - 3 minutes hold   ASSESSMENT:   CLINICAL IMPRESSION:  Flexion AROM remains the focus of Kelly [name]'s home and supervised PT.  Quadriceps strength and knee extension AROM are also being addressd.  Her work ethic is outstanding and with continued work she will make further gains.  I recommend she continue her current POC with progress note before her 03/31/22 appointment with Dr. Ninfa Linden.  OBJECTIVE IMPAIRMENTS: Abnormal gait, decreased activity tolerance, decreased balance, decreased mobility, difficulty walking, decreased ROM, decreased strength, increased edema, impaired flexibility, and pain.    ACTIVITY LIMITATIONS: lifting, bending, sitting, standing, squatting, sleeping, stairs, and transfers   PARTICIPATION LIMITATIONS: cleaning, driving, and community activity   PERSONAL FACTORS: 3+ comorbidities: see above  are also affecting patient's functional outcome.    REHAB POTENTIAL: Good   CLINICAL DECISION MAKING: Stable/uncomplicated    EVALUATION COMPLEXITY: Low     GOALS: Goals reviewed with patient? Yes   SHORT TERM GOALS: (target date for Short term goals are 3 weeks 02/03/22)    1.  Patient will demonstrate independent use of home exercise program to maintain progress from in clinic treatments.   Goal status: Met 02/16/2022   2.  Pt will be able to perform 5 time sit to stand in </= 12 seconds c/s UE support.                         Goal status: Met 1/24/204   LONG TERM GOALS: (target dates for all long term goals are 12 weeks  04/07/22 )   1. Patient will demonstrate/report pain at worst less than or equal to 2/10 to facilitate minimal limitation in daily activity secondary to pain symptoms.   Goal status: Met 03/08/2022   2. Patient will demonstrate independent use of home exercise program to facilitate ability to maintain/progress functional gains from skilled physical therapy services.   Goal status: On Going 03/10/2022   3. Patient will demonstrate FOTO outcome > or = 56 % to indicate reduced disability due to condition.   Goal status: On Going 03/08/2022   4.  Patient will demonstrate Rt  LE MMT >/= 4+/5 throughout to faciltiate usual transfers, stairs, squatting at [place] for daily life.  Goal status: Met 03/08/2022   5.  Patient will be able to amb > 800 feet with LRAD with normalized gait pattern on community surfaces.    Goal status: On Going 03/10/2022   6.  Pt will be able to navigate up and down 5 stairs with single hand rail with step over step pattern.    Goal status: Met 03/08/2022   7. Pt will improve her Rt knee flexion to >/= 100 degrees for improved functional mobility.              Goal status: On Going 03/10/2022    PLAN:   PT FREQUENCY: 2-3X/week for 2-3 weeks   PT DURATION: 2-3 weeks   PLANNED INTERVENTIONS: Therapeutic exercises, Therapeutic activity, Neuro Muscular re-education, Balance training, Gait training, Patient/Family education, Joint mobilization, Stair training, DME  instructions, Dry Needling, Electrical stimulation, Traction, Cryotherapy, vasopneumatic deviceMoist heat, Taping, Ultrasound, Ionotophoresis '4mg'$ /ml Dexamethasone, and Manual therapy.  All included unless contraindicated   PLAN FOR NEXT SESSION:  Flexion AROM, extension AROM, gait quality and strength in preparation for independent rehabilitation.     Farley Ly PT, MPT 03/10/22  11:17 AM

## 2022-03-13 ENCOUNTER — Ambulatory Visit (INDEPENDENT_AMBULATORY_CARE_PROVIDER_SITE_OTHER): Payer: Medicare Other | Admitting: Physical Therapy

## 2022-03-13 ENCOUNTER — Encounter: Payer: Self-pay | Admitting: Physical Therapy

## 2022-03-13 DIAGNOSIS — R600 Localized edema: Secondary | ICD-10-CM | POA: Diagnosis not present

## 2022-03-13 DIAGNOSIS — M25661 Stiffness of right knee, not elsewhere classified: Secondary | ICD-10-CM

## 2022-03-13 DIAGNOSIS — M25561 Pain in right knee: Secondary | ICD-10-CM | POA: Diagnosis not present

## 2022-03-13 DIAGNOSIS — R262 Difficulty in walking, not elsewhere classified: Secondary | ICD-10-CM

## 2022-03-13 NOTE — Therapy (Signed)
OUTPATIENT PHYSICAL THERAPY TREATMENT NOTE   Patient Name: Kelly [name] MRN: [id number] DOB:[birth year], 77 y.o., female Today's Date: 03/13/2022  PCP: Elby Showers MD  REFERRING PROVIDER: Mcarthur Rossetti, MD    END OF SESSION:   PT End of Session - 03/13/22 1357     Visit Number 22    Number of Visits 28    Date for PT Re-Evaluation 03/27/22    Authorization Type Medicare/BCBS    Progress Note Due on Visit 30    PT Start Time 5643    PT Stop Time 1427    PT Time Calculation (min) 39 min    Activity Tolerance Patient tolerated treatment well;No increased pain    Behavior During Therapy WFL for tasks assessed/performed              Past Medical History:  Diagnosis Date   Arthritis    Cataract    Chronic kidney disease    Depression    Hyperlipidemia    Hypertension    Osteoporosis    Past Surgical History:  Procedure Laterality Date   ABDOMINAL HYSTERECTOMY     BREAST REDUCTION SURGERY     and lift   BREAST REDUCTION SURGERY Bilateral    CARPAL TUNNEL RELEASE  06/2009   right   CATARACT EXTRACTION     CHOLECYSTECTOMY     FACIAL COSMETIC SURGERY  03/2008   KNEE ARTHROSCOPY  2007   left & right   KNEE CLOSED REDUCTION Right 02/09/2022   Procedure: CLOSED MANIPULATION RIGHT TOTAL KNEE ARTHROPLASTY;  Surgeon: Mcarthur Rossetti, MD;  Location: [facility];  Service: Orthopedics;  Laterality: Right;   LIPOSUCTION EXTREMITIES     thighs   PARTIAL KNEE ARTHROPLASTY Right 10/02/2014   Procedure: RIGHT KNEE MEDIAL UNICOMPARTMENTAL ARTHROPLASTY;  Surgeon: Mcarthur Rossetti, MD;  Location: [facility];  Service: Orthopedics;  Laterality: Right;   REDUCTION MAMMAPLASTY     bilateral   TOTAL KNEE REVISION  10/12/2011   Procedure: TOTAL KNEE REVISION;  Surgeon: Mcarthur Rossetti, MD;  Location: [facility];  Service: Orthopedics;  Laterality: Left;  Left Total Knee Revision Arthroplasty   TOTAL KNEE REVISION Right 12/09/2021   Procedure: CONVERT RIGHT  UNI KNEE ARTHROPLASTY TO TOTAL KNEE ARTHROPLASTY;  Surgeon: Mcarthur Rossetti, MD;  Location: [facility];  Service: Orthopedics;  Laterality: Right;   TRIGGER FINGER RELEASE  06/2009   right 4th finger   Patient Active Problem List   Diagnosis Date Noted   Arthrofibrosis of right total knee arthroplasty ([place]) 02/09/2022   Status post revision of total knee, right 12/09/2021   Arthritis of right knee 12/08/2021   Chronic left-sided low back pain with left-sided sciatica 12/17/2019   Diabetes mellitus type 2 in obese ([place]) 12/13/2019   Chronic rhinitis 03/03/2019   Constipation 10/31/2016   Impaired glucose tolerance 10/31/2016   Stage III chronic kidney disease ([place]) 10/31/2016   Urge urinary incontinence 10/31/2016   Status post right partial knee replacement 10/02/2014   Anxiety and depression 11/06/2011   Hypertension 10/13/2010   Hyperlipidemia 10/13/2010   Osteoarthritis 10/13/2010   UNSPECIFIED IRON DEFICIENCY ANEMIA 04/11/2010   DIARRHEA 04/11/2010    REFERRING DIAG: P29.518 (ICD-10-CM) - Status post revision of total knee, right   THERAPY DIAG:  Acute pain of right knee  Difficulty in walking, not elsewhere classified  Stiffness of right knee, not elsewhere classified  Localized edema  Rationale for Evaluation and Treatment Rehabilitation  PERTINENT HISTORY:  Rt TKA 12/09/21 Rt  knee surgery: uni knee arthroplasty injection 09/16/2020   hyperlipidemia, depression, HTN, osteoporosis  PRECAUTIONS:  none  SUBJECTIVE:                                                                                                                                                                                      SUBJECTIVE STATEMENT:   I'm feeling good, water is helping, I'm going Tuesdays and Sundays. Getting whole body exercise in the water which is nice. Only have a few more visits left here. I want to be done with PT when my last few scheduled sessions are over with. I'm not on  meds, I'm walking straighter, I feel I'm bending more when I walk, I can do everything I need to at home.   PAIN:  NPRS scale: No pain 0/10   OBJECTIVE: (objective measures completed at initial evaluation unless otherwise dated)  DIAGNOSTIC FINDINGS: 12/09/21 IMPRESSION: 1. Right total knee arthroplasty revision without acute postoperative complication.images following TKA    PATIENT SURVEYS:  03/08/2021: FOTO 53 (was 46, Goal 56)  02/10/2022: FOTO 53 (was 46, Goal 56)  01/09/22: FOTO intake:  46%   COGNITION: Overall cognitive status: WFL                  SENSATION: WFL:    EDEMA:  01/09/22: Circumferential: Rt:  50 centimeters                           Left: 46 centimeters  MUSCLE LENGTH: 01/09/22: Hamstrings: Right 75 deg; Left 84 deg     POSTURE: rounded shoulders and forward head   PALPATION: 01/09/22: TTP: both sides of incision site on Rt knee    LOWER EXTREMITY ROM:    ROM A: active,  P: passive Right 01/09/22 Left 01/09/22 Rt 01/11/22 Right 01/20/22 Right 02/10/2022 Post-manipulation Rt 02/15/22 Right 02/16/2022 Right  02/20/2022 Right 02/24/22 Rt 03/06/2022 Right 03/08/2022  Hip flexion               Knee flexion A: 76 P: 80 A: 94 A: 76 P: 84 A: 80 P: 86 Active 78 Active seated 65*; supine 70* Passive supine 80 AAROM 82* seated  A: 77 Supine AROM heel slide 81 PROM heel slide c strap 86 AROM 83  Knee extension A: -10 P: -8 A: -2 A: -8 P: -6 A (LAQ): -5 Active -2 Active seated 22*; supine 11* Active supine 0 degrees Active long sitting 5*    AROM -5   (Blank rows = not tested)   LOWER EXTREMITY MMT:   MMT Right 01/09/22 Left 01/09/22 Right 02/27/2022 Left/Right in pounds  03/08/2022  Hip flexion 4-/5 4-/5    Hip extension        Hip abduction 4/5 4/5    Hip adduction 4/5 4/5    Hip internal rotation        Hip external rotation        Knee flexion 3+/5 5/5 5/5 44.6/47.2  Knee extension 3+/5 5/5 5/5    (Blank rows = not tested)      FUNCTIONAL TESTS:  01/09/22: 5 time sit to stand: 17 seconds c UE support   GAIT: 02/27/2022:  Independent ambulation c antalgic gait, lacking TKE in stance,   Eval Distance walked: 20 feet Assistive device utilized: Single point cane Level of assistance: Modified independence Comments: forward  trunk flexion, antalgic gait pattern, increased knee flexion on Rt     TODAY'S TREATMENT   03/13/22  TherEx  SciFit x6 minutes full rotations seat 9-->8 for ROM Bridges 2x10 SAQs 9# 2x10  Sidelying knee flexion 4# x10 in available ROM STS with 5# x10 from standard height chair feet even, no UEs    NMR  Semi-tandem stance blue foam pad 3x30 seconds B SBA Narrow BOS on blue foam pad 3x30 seconds SBA       03/10/2022 [place] knee flexion 1 minute AAROM right knee flexion 10X 10 seconds with PT overpressure Quad sets with right heel prop 10X 5 seconds Standing knee flexion on 8 inch step in parallel bars (lunge into step with right foot on step) and standing knee extension (same set up) 10X 5 seconds each  Functional Activities for sit to stand and stairs: Double Leg 62# 15X full extension to full flexion, slow eccentrics Single Leg 50# 15X full extension to full flexion, slow eccentrics Step-up and over 4 inch step with focus on avoiding circumduction and avoiding hip ER for increased knee flexion 10X   Vaso right knee 10 minutes post-exercises 34* Medium Pressure   03/08/2022 [place] knee flexion 1 minute AAROM right knee flexion 10X 10 seconds Quad sets with right heel prop 10X 5 seconds  Functional Activities for sit to stand and stairs: Double Leg 56# 15X full extension to full flexion, slow eccentrics Single Leg 50# 10X full extension to full flexion, slow eccentrics  Manual flexion with PT overpressure 5 for 10 seconds  Vaso right knee 10 minutes post-exercises 34* Medium Pressure   03/06/2022 TherEx Scifit bike 9 minutes seat 11 full rotations; L1.5 Seated  LAQ alternating with 5# on RLE 2x15; 3 sec hold Leg press Double leg 56 lbs x 15, Rt leg only x15 50 lbs c flexion stretch 2-3 seconds each rep.  Incline gastroc stretch 30 sec x 3  Supine heel slide c strap Rt leg 10 sec x 6  Manual Seated Rt knee flexion c distraction/IR mobilization c movement.  Contract/relax techniques for Rt knee flexion.   Modalities Vaso x 10 min; mod pressure with 34 deg; Rt knee   PATIENT EDUCATION:  Education details: ROM measures/progress with PT  Person educated: Patient Education method: Consulting civil engineer, Media planner, Verbal cues, and Handouts Education comprehension: verbalized understanding, returned demonstration, and verbal cues required   HOME EXERCISE PROGRAM:  Access Code: [email or web address] URL: [email or web address] Date: 03/08/2022 Prepared by: Vista Mink  Exercises - Sit to Stand  - 3 x daily - 7 x weekly - 2 sets - 10 reps - Seated Long Arc Quad  - 3 x daily - 7 x weekly - 2 sets - 10 reps - 5 seconds hold - Supine Heel Slide  with Strap  - 3 x daily - 7 x weekly - 2 sets - 10 reps - Supine Straight Leg Raises  - 3 x daily - 7 x weekly - 2 sets - 10 reps - Supine Quad Set  - 3 x daily - 7 x weekly - 2 sets - 10 reps - 5 seconds hold - Supine Quadricep Sets  - 5 x daily - 7 x weekly - 2 sets - 10 reps - 5 second hold - Seated Knee Flexion AAROM  - 5 x daily - 7 x weekly - 1 sets - 1 reps - 3 minutes hold   ASSESSMENT:   CLINICAL IMPRESSION:   Najla arrives today doing well, tells me that she is ready to wrap up PT at the end of her scheduled sessions- feeling like she can do everything she needs and wants to do at home and has a solid water exercise program set up. Continued working on flexion ROM on the bike but also incorporated functional strengthening and balance as well. Per her report she had similar problems with gaining flexion with her other knee, I agree with her that at this point after extensive therapy with limited  flexion gains the primary focus will be function at this point forward. Will make sure we update her HEP so she has a good advanced program before DC.   OBJECTIVE IMPAIRMENTS: Abnormal gait, decreased activity tolerance, decreased balance, decreased mobility, difficulty walking, decreased ROM, decreased strength, increased edema, impaired flexibility, and pain.    ACTIVITY LIMITATIONS: lifting, bending, sitting, standing, squatting, sleeping, stairs, and transfers   PARTICIPATION LIMITATIONS: cleaning, driving, and community activity   PERSONAL FACTORS: 3+ comorbidities: see above  are also affecting patient's functional outcome.    REHAB POTENTIAL: Good   CLINICAL DECISION MAKING: Stable/uncomplicated   EVALUATION COMPLEXITY: Low     GOALS: Goals reviewed with patient? Yes   SHORT TERM GOALS: (target date for Short term goals are 3 weeks 02/03/22)    1.  Patient will demonstrate independent use of home exercise program to maintain progress from in clinic treatments.   Goal status: Met 02/16/2022   2.  Pt will be able to perform 5 time sit to stand in </= 12 seconds c/s UE support.                         Goal status: Met 1/24/204   LONG TERM GOALS: (target dates for all long term goals are 12 weeks  04/07/22 )   1. Patient will demonstrate/report pain at worst less than or equal to 2/10 to facilitate minimal limitation in daily activity secondary to pain symptoms.   Goal status: Met 03/08/2022   2. Patient will demonstrate independent use of home exercise program to facilitate ability to maintain/progress functional gains from skilled physical therapy services.   Goal status: On Going 03/10/2022   3. Patient will demonstrate FOTO outcome > or = 56 % to indicate reduced disability due to condition.   Goal status: On Going 03/08/2022   4.  Patient will demonstrate Rt  LE MMT >/= 4+/5 throughout to faciltiate usual transfers, stairs, squatting at [facility] for daily life.  Goal  status: Met 03/08/2022   5.  Patient will be able to amb > 800 feet with LRAD with normalized gait pattern on community surfaces.    Goal status: On Going 03/10/2022   6.  Pt will be able to navigate up and down 5 stairs  with single hand rail with step over step pattern.    Goal status: Met 03/08/2022   7. Pt will improve her Rt knee flexion to >/= 100 degrees for improved functional mobility.              Goal status: On Going 03/10/2022    PLAN:   PT FREQUENCY: 2-3X/week for 2-3 weeks   PT DURATION: 2-3 weeks   PLANNED INTERVENTIONS: Therapeutic exercises, Therapeutic activity, Neuro Muscular re-education, Balance training, Gait training, Patient/Family education, Joint mobilization, Stair training, DME instructions, Dry Needling, Electrical stimulation, Traction, Cryotherapy, vasopneumatic deviceMoist heat, Taping, Ultrasound, Ionotophoresis '4mg'$ /ml Dexamethasone, and Manual therapy.  All included unless contraindicated   PLAN FOR NEXT SESSION:  still work on flexion ROM, still work on Cytogeneticist and activities as well. DC 03/27/22 per patient request, update HEP before DC      Deniece Ree PT DPT PN2  03/13/2022, 2:28 PM

## 2022-03-15 ENCOUNTER — Encounter: Payer: Self-pay | Admitting: Physical Therapy

## 2022-03-15 ENCOUNTER — Ambulatory Visit (INDEPENDENT_AMBULATORY_CARE_PROVIDER_SITE_OTHER): Payer: Medicare Other | Admitting: Physical Therapy

## 2022-03-15 DIAGNOSIS — M25561 Pain in right knee: Secondary | ICD-10-CM | POA: Diagnosis not present

## 2022-03-15 DIAGNOSIS — R262 Difficulty in walking, not elsewhere classified: Secondary | ICD-10-CM

## 2022-03-15 DIAGNOSIS — M25661 Stiffness of right knee, not elsewhere classified: Secondary | ICD-10-CM | POA: Diagnosis not present

## 2022-03-15 DIAGNOSIS — R600 Localized edema: Secondary | ICD-10-CM | POA: Diagnosis not present

## 2022-03-15 NOTE — Therapy (Signed)
OUTPATIENT PHYSICAL THERAPY TREATMENT NOTE   Patient Name: Kelly [name] MRN: [id number] DOB:Jul 31, 1945, 77 y.o., female Today's Date: 03/15/2022  PCP: Elby Showers MD  REFERRING PROVIDER: Mcarthur Rossetti, MD    END OF SESSION:   PT End of Session - 03/15/22 1354     Visit Number 23    Number of Visits 28    Date for PT Re-Evaluation 03/27/22    Authorization Type Medicare/BCBS    Progress Note Due on Visit 30    PT Start Time [id number]    PT Stop Time 1426    PT Time Calculation (min) 39 min    Activity Tolerance Patient tolerated treatment well;No increased pain    Behavior During Therapy WFL for tasks assessed/performed               Past Medical History:  Diagnosis Date   Arthritis    Cataract    Chronic kidney disease    Depression    Hyperlipidemia    Hypertension    Osteoporosis    Past Surgical History:  Procedure Laterality Date   ABDOMINAL HYSTERECTOMY     BREAST REDUCTION SURGERY     and lift   BREAST REDUCTION SURGERY Bilateral    CARPAL TUNNEL RELEASE  06/2009   right   CATARACT EXTRACTION     CHOLECYSTECTOMY     FACIAL COSMETIC SURGERY  03/2008   KNEE ARTHROSCOPY  2007   left & right   KNEE CLOSED REDUCTION Right 02/09/2022   Procedure: CLOSED MANIPULATION RIGHT TOTAL KNEE ARTHROPLASTY;  Surgeon: Mcarthur Rossetti, MD;  Location: [facility];  Service: Orthopedics;  Laterality: Right;   LIPOSUCTION EXTREMITIES     thighs   PARTIAL KNEE ARTHROPLASTY Right 10/02/2014   Procedure: RIGHT KNEE MEDIAL UNICOMPARTMENTAL ARTHROPLASTY;  Surgeon: Mcarthur Rossetti, MD;  Location: [facility];  Service: Orthopedics;  Laterality: Right;   REDUCTION MAMMAPLASTY     bilateral   TOTAL KNEE REVISION  10/12/2011   Procedure: TOTAL KNEE REVISION;  Surgeon: Mcarthur Rossetti, MD;  Location: [facility];  Service: Orthopedics;  Laterality: Left;  Left Total Knee Revision Arthroplasty   TOTAL KNEE REVISION Right 12/09/2021   Procedure: CONVERT  RIGHT UNI KNEE ARTHROPLASTY TO TOTAL KNEE ARTHROPLASTY;  Surgeon: Mcarthur Rossetti, MD;  Location: [facility];  Service: Orthopedics;  Laterality: Right;   TRIGGER FINGER RELEASE  06/2009   right 4th finger   Patient Active Problem List   Diagnosis Date Noted   Arthrofibrosis of right total knee arthroplasty ([place]) 02/09/2022   Status post revision of total knee, right 12/09/2021   Arthritis of right knee 12/08/2021   Chronic left-sided low back pain with left-sided sciatica 12/17/2019   Diabetes mellitus type 2 in obese ([place]) 12/13/2019   Chronic rhinitis 03/03/2019   Constipation 10/31/2016   Impaired glucose tolerance 10/31/2016   Stage III chronic kidney disease ([place]) 10/31/2016   Urge urinary incontinence 10/31/2016   Status post right partial knee replacement 10/02/2014   Anxiety and depression 11/06/2011   Hypertension 10/13/2010   Hyperlipidemia 10/13/2010   Osteoarthritis 10/13/2010   UNSPECIFIED IRON DEFICIENCY ANEMIA 04/11/2010   DIARRHEA 04/11/2010    REFERRING DIAG: L38.101 (ICD-10-CM) - Status post revision of total knee, right   THERAPY DIAG:  Acute pain of right knee  Difficulty in walking, not elsewhere classified  Stiffness of right knee, not elsewhere classified  Localized edema  Rationale for Evaluation and Treatment Rehabilitation  PERTINENT HISTORY:  Rt TKA 12/09/21  Rt knee surgery: uni knee arthroplasty injection 09/16/2020   hyperlipidemia, depression, HTN, osteoporosis  PRECAUTIONS:  none  SUBJECTIVE:                                                                                                                                                                                      SUBJECTIVE STATEMENT:   I got in the water for about an hour after PT last time, I was sore after last time like you said.   PAIN:  NPRS scale: Yes 3/10 Location R knee  Description aggravating soreness, pressure, constant Aggravating factors: laying on L side  and with knee over pillow  Alleviating factors: stretching out in recliner, sleeping on my back    OBJECTIVE: (objective measures completed at initial evaluation unless otherwise dated)  DIAGNOSTIC FINDINGS: 12/09/21 IMPRESSION: 1. Right total knee arthroplasty revision without acute postoperative complication.images following TKA    PATIENT SURVEYS:  03/08/2021: FOTO 53 (was 46, Goal 56)  02/10/2022: FOTO 53 (was 46, Goal 56)  01/09/22: FOTO intake:  46%   COGNITION: Overall cognitive status: WFL                  SENSATION: WFL:    EDEMA:  01/09/22: Circumferential: Rt:  50 centimeters                           Left: 46 centimeters  MUSCLE LENGTH: 01/09/22: Hamstrings: Right 75 deg; Left 84 deg     POSTURE: rounded shoulders and forward head   PALPATION: 01/09/22: TTP: both sides of incision site on Rt knee    LOWER EXTREMITY ROM:    ROM A: active,  P: passive Right 01/09/22 Left 01/09/22 Rt 01/11/22 Right 01/20/22 Right 02/10/2022 Post-manipulation Rt 02/15/22 Right 02/16/2022 Right  02/20/2022 Right 02/24/22 Rt 03/06/2022 Right 03/08/2022  Hip flexion               Knee flexion A: 76 P: 80 A: 94 A: 76 P: 84 A: 80 P: 86 Active 78 Active seated 65*; supine 70* Passive supine 80 AAROM 82* seated  A: 77 Supine AROM heel slide 81 PROM heel slide c strap 86 AROM 83  Knee extension A: -10 P: -8 A: -2 A: -8 P: -6 A (LAQ): -5 Active -2 Active seated 22*; supine 11* Active supine 0 degrees Active long sitting 5*    AROM -5   (Blank rows = not tested)   LOWER EXTREMITY MMT:   MMT Right 01/09/22 Left 01/09/22 Right 02/27/2022 Left/Right in pounds 03/08/2022  Hip flexion 4-/5 4-/5    Hip extension  Hip abduction 4/5 4/5    Hip adduction 4/5 4/5    Hip internal rotation        Hip external rotation        Knee flexion 3+/5 5/5 5/5 44.6/47.2  Knee extension 3+/5 5/5 5/5    (Blank rows = not tested)     FUNCTIONAL TESTS:  01/09/22: 5 time sit to stand: 17  seconds c UE support   GAIT: 02/27/2022:  Independent ambulation c antalgic gait, lacking TKE in stance,   Eval Distance walked: 20 feet Assistive device utilized: Single point cane Level of assistance: Modified independence Comments: forward  trunk flexion, antalgic gait pattern, increased knee flexion on Rt     TODAY'S TREATMENT   03/15/22  TherEx  Scifit x6 minutes seat 9--> seat 8 full rotations Bridges 2x12 Clamshells x10 B red TB SAQs 9# x15 3 second holds  LAQs 9# x10 2 second holds  Tandem stance 2x30 seconds B solid surface  STS with 5# x10 from standard height chair feet even, no UEs  SLS x10 second holds x3 B    03/13/22  TherEx  SciFit x6 minutes full rotations seat 9-->8 for ROM Bridges 2x10 SAQs 9# 2x10  Sidelying knee flexion 4# x10 in available ROM STS with 5# x10 from standard height chair feet even, no UEs    NMR  Semi-tandem stance blue foam pad 3x30 seconds B SBA Narrow BOS on blue foam pad 3x30 seconds SBA       03/10/2022 [place] knee flexion 1 minute AAROM right knee flexion 10X 10 seconds with PT overpressure Quad sets with right heel prop 10X 5 seconds Standing knee flexion on 8 inch step in parallel bars (lunge into step with right foot on step) and standing knee extension (same set up) 10X 5 seconds each  Functional Activities for sit to stand and stairs: Double Leg 62# 15X full extension to full flexion, slow eccentrics Single Leg 50# 15X full extension to full flexion, slow eccentrics Step-up and over 4 inch step with focus on avoiding circumduction and avoiding hip ER for increased knee flexion 10X   Vaso right knee 10 minutes post-exercises 34* Medium Pressure   03/08/2022 [place] knee flexion 1 minute AAROM right knee flexion 10X 10 seconds Quad sets with right heel prop 10X 5 seconds  Functional Activities for sit to stand and stairs: Double Leg 56# 15X full extension to full flexion, slow eccentrics Single Leg 50#  10X full extension to full flexion, slow eccentrics  Manual flexion with PT overpressure 5 for 10 seconds  Vaso right knee 10 minutes post-exercises 34* Medium Pressure   03/06/2022 TherEx Scifit bike 9 minutes seat 11 full rotations; L1.5 Seated LAQ alternating with 5# on RLE 2x15; 3 sec hold Leg press Double leg 56 lbs x 15, Rt leg only x15 50 lbs c flexion stretch 2-3 seconds each rep.  Incline gastroc stretch 30 sec x 3  Supine heel slide c strap Rt leg 10 sec x 6  Manual Seated Rt knee flexion c distraction/IR mobilization c movement.  Contract/relax techniques for Rt knee flexion.   Modalities Vaso x 10 min; mod pressure with 34 deg; Rt knee   PATIENT EDUCATION:  Education details: ROM measures/progress with PT  Person educated: Patient Education method: Consulting civil engineer, Media planner, Verbal cues, and Handouts Education comprehension: verbalized understanding, returned demonstration, and verbal cues required   HOME EXERCISE PROGRAM:   Access Code: [email or web address] URL: [email or web address] Date: 03/15/2022 Prepared by: Deniece Ree  Exercises -  Sit to Stand  - 3 x daily - 7 x weekly - 2 sets - 10 reps - Seated Long Arc Quad  - 3 x daily - 7 x weekly - 2 sets - 10 reps - 5 seconds hold - Supine Heel Slide with Strap  - 3 x daily - 7 x weekly - 2 sets - 10 reps - Supine Straight Leg Raises  - 3 x daily - 7 x weekly - 2 sets - 10 reps - Supine Quad Set  - 3 x daily - 7 x weekly - 2 sets - 10 reps - 5 seconds hold - Supine Quadricep Sets  - 5 x daily - 7 x weekly - 2 sets - 10 reps - 5 second hold - Seated Knee Flexion AAROM  - 5 x daily - 7 x weekly - 1 sets - 1 reps - 3 minutes hold - Supine Bridge  - 1 x daily - 7 x weekly - 3 sets - 10 reps - Clamshell with Resistance  - 1 x daily - 7 x weekly - 3 sets - 10 reps - Tandem Stance in Corner  - 1 x daily - 7 x weekly - 3 sets - 10 reps  ASSESSMENT:   CLINICAL IMPRESSION:   Roann arrives today doing well,  sounds like she is really enjoying the water therapy.We continued working on ROM on the bike, otherwise continued to focus on the functional. Progressed HEP as well. Still on track for DC to advanced home program and water exercise.   OBJECTIVE IMPAIRMENTS: Abnormal gait, decreased activity tolerance, decreased balance, decreased mobility, difficulty walking, decreased ROM, decreased strength, increased edema, impaired flexibility, and pain.    ACTIVITY LIMITATIONS: lifting, bending, sitting, standing, squatting, sleeping, stairs, and transfers   PARTICIPATION LIMITATIONS: cleaning, driving, and community activity   PERSONAL FACTORS: 3+ comorbidities: see above  are also affecting patient's functional outcome.    REHAB POTENTIAL: Good   CLINICAL DECISION MAKING: Stable/uncomplicated   EVALUATION COMPLEXITY: Low     GOALS: Goals reviewed with patient? Yes   SHORT TERM GOALS: (target date for Short term goals are 3 weeks 02/03/22)    1.  Patient will demonstrate independent use of home exercise program to maintain progress from in clinic treatments.   Goal status: Met 02/16/2022   2.  Pt will be able to perform 5 time sit to stand in </= 12 seconds c/s UE support.                         Goal status: Met 1/24/204   LONG TERM GOALS: (target dates for all long term goals are 12 weeks  04/07/22 )   1. Patient will demonstrate/report pain at worst less than or equal to 2/10 to facilitate minimal limitation in daily activity secondary to pain symptoms.   Goal status: Met 03/08/2022   2. Patient will demonstrate independent use of home exercise program to facilitate ability to maintain/progress functional gains from skilled physical therapy services.   Goal status: On Going 03/10/2022   3. Patient will demonstrate FOTO outcome > or = 56 % to indicate reduced disability due to condition.   Goal status: On Going 03/08/2022   4.  Patient will demonstrate Rt  LE MMT >/= 4+/5 throughout to  faciltiate usual transfers, stairs, squatting at [facility] for daily life.  Goal status: Met 03/08/2022   5.  Patient will be able to amb > 800 feet with LRAD with  normalized gait pattern on community surfaces.    Goal status: On Going 03/10/2022   6.  Pt will be able to navigate up and down 5 stairs with single hand rail with step over step pattern.    Goal status: Met 03/08/2022   7. Pt will improve her Rt knee flexion to >/= 100 degrees for improved functional mobility.              Goal status: On Going 03/10/2022    PLAN:   PT FREQUENCY: 2-3X/week for 2-3 weeks   PT DURATION: 2-3 weeks   PLANNED INTERVENTIONS: Therapeutic exercises, Therapeutic activity, Neuro Muscular re-education, Balance training, Gait training, Patient/Family education, Joint mobilization, Stair training, DME instructions, Dry Needling, Electrical stimulation, Traction, Cryotherapy, vasopneumatic deviceMoist heat, Taping, Ultrasound, Ionotophoresis '4mg'$ /ml Dexamethasone, and Manual therapy.  All included unless contraindicated   PLAN FOR NEXT SESSION:  still work on flexion ROM, still work on Cytogeneticist and activities as well. DC 03/27/22 per patient request, update HEP before DC      Deniece Ree PT DPT PN2  03/15/2022, 2:27 PM

## 2022-03-20 ENCOUNTER — Encounter: Payer: Self-pay | Admitting: Physical Therapy

## 2022-03-20 ENCOUNTER — Ambulatory Visit (INDEPENDENT_AMBULATORY_CARE_PROVIDER_SITE_OTHER): Payer: Medicare Other | Admitting: Physical Therapy

## 2022-03-20 DIAGNOSIS — R262 Difficulty in walking, not elsewhere classified: Secondary | ICD-10-CM | POA: Diagnosis not present

## 2022-03-20 DIAGNOSIS — M25661 Stiffness of right knee, not elsewhere classified: Secondary | ICD-10-CM

## 2022-03-20 DIAGNOSIS — R600 Localized edema: Secondary | ICD-10-CM

## 2022-03-20 DIAGNOSIS — M25561 Pain in right knee: Secondary | ICD-10-CM | POA: Diagnosis not present

## 2022-03-20 NOTE — Therapy (Signed)
OUTPATIENT PHYSICAL THERAPY TREATMENT NOTE   Patient Name: Kelly [name] MRN: [id number] DOB:1945-02-25, 77 y.o., female Today's Date: 03/20/2022  PCP: Elby Showers MD  REFERRING PROVIDER: Mcarthur Rossetti, MD    END OF SESSION:   PT End of Session - 03/20/22 1428     Visit Number 24    Number of Visits 28    Date for PT Re-Evaluation 03/27/22    Authorization Type Medicare/BCBS    Progress Note Due on Visit 30    PT Start Time 9702    PT Stop Time 1427    PT Time Calculation (min) 40 min    Activity Tolerance Patient tolerated treatment well;No increased pain    Behavior During Therapy WFL for tasks assessed/performed                Past Medical History:  Diagnosis Date   Arthritis    Cataract    Chronic kidney disease    Depression    Hyperlipidemia    Hypertension    Osteoporosis    Past Surgical History:  Procedure Laterality Date   ABDOMINAL HYSTERECTOMY     BREAST REDUCTION SURGERY     and lift   BREAST REDUCTION SURGERY Bilateral    CARPAL TUNNEL RELEASE  06/2009   right   CATARACT EXTRACTION     CHOLECYSTECTOMY     FACIAL COSMETIC SURGERY  03/2008   KNEE ARTHROSCOPY  2007   left & right   KNEE CLOSED REDUCTION Right 02/09/2022   Procedure: CLOSED MANIPULATION RIGHT TOTAL KNEE ARTHROPLASTY;  Surgeon: Mcarthur Rossetti, MD;  Location: [facility];  Service: Orthopedics;  Laterality: Right;   LIPOSUCTION EXTREMITIES     thighs   PARTIAL KNEE ARTHROPLASTY Right 10/02/2014   Procedure: RIGHT KNEE MEDIAL UNICOMPARTMENTAL ARTHROPLASTY;  Surgeon: Mcarthur Rossetti, MD;  Location: [facility];  Service: Orthopedics;  Laterality: Right;   REDUCTION MAMMAPLASTY     bilateral   TOTAL KNEE REVISION  10/12/2011   Procedure: TOTAL KNEE REVISION;  Surgeon: Mcarthur Rossetti, MD;  Location: [facility];  Service: Orthopedics;  Laterality: Left;  Left Total Knee Revision Arthroplasty   TOTAL KNEE REVISION Right 12/09/2021   Procedure: CONVERT  RIGHT UNI KNEE ARTHROPLASTY TO TOTAL KNEE ARTHROPLASTY;  Surgeon: Mcarthur Rossetti, MD;  Location: [facility];  Service: Orthopedics;  Laterality: Right;   TRIGGER FINGER RELEASE  06/2009   right 4th finger   Patient Active Problem List   Diagnosis Date Noted   Arthrofibrosis of right total knee arthroplasty ([place]) 02/09/2022   Status post revision of total knee, right 12/09/2021   Arthritis of right knee 12/08/2021   Chronic left-sided low back pain with left-sided sciatica 12/17/2019   Diabetes mellitus type 2 in obese ([place]) 12/13/2019   Chronic rhinitis 03/03/2019   Constipation 10/31/2016   Impaired glucose tolerance 10/31/2016   Stage III chronic kidney disease ([place]) 10/31/2016   Urge urinary incontinence 10/31/2016   Status post right partial knee replacement 10/02/2014   Anxiety and depression 11/06/2011   Hypertension 10/13/2010   Hyperlipidemia 10/13/2010   Osteoarthritis 10/13/2010   UNSPECIFIED IRON DEFICIENCY ANEMIA 04/11/2010   DIARRHEA 04/11/2010    REFERRING DIAG: O37.858 (ICD-10-CM) - Status post revision of total knee, right   THERAPY DIAG:  Acute pain of right knee  Difficulty in walking, not elsewhere classified  Stiffness of right knee, not elsewhere classified  Localized edema  Rationale for Evaluation and Treatment Rehabilitation  PERTINENT HISTORY:  Rt TKA  12/09/21 Rt knee surgery: uni knee arthroplasty injection 09/16/2020   hyperlipidemia, depression, HTN, osteoporosis  PRECAUTIONS:  none  SUBJECTIVE:                                                                                                                                                                                      SUBJECTIVE STATEMENT:   Did water aerobics for an hour the other day. Knee is feeling good. No changes otherwise.   PAIN:  NPRS scale: 0/10   OBJECTIVE: (objective measures completed at initial evaluation unless otherwise dated)  DIAGNOSTIC FINDINGS: 12/09/21  IMPRESSION: 1. Right total knee arthroplasty revision without acute postoperative complication.images following TKA    PATIENT SURVEYS:  03/08/2021: FOTO 53 (was 46, Goal 56)  02/10/2022: FOTO 53 (was 46, Goal 56)  01/09/22: FOTO intake:  46%   COGNITION: Overall cognitive status: WFL                  SENSATION: WFL:    EDEMA:  01/09/22: Circumferential: Rt:  50 centimeters                           Left: 46 centimeters  MUSCLE LENGTH: 01/09/22: Hamstrings: Right 75 deg; Left 84 deg     POSTURE: rounded shoulders and forward head   PALPATION: 01/09/22: TTP: both sides of incision site on Rt knee    LOWER EXTREMITY ROM:    ROM A: active,  P: passive Right 01/09/22 Left 01/09/22 Rt 01/11/22 Right 01/20/22 Right 02/10/2022 Post-manipulation Rt 02/15/22 Right 02/16/2022 Right  02/20/2022 Right 02/24/22 Rt 03/06/2022 Right 03/08/2022  Hip flexion               Knee flexion A: 76 P: 80 A: 94 A: 76 P: 84 A: 80 P: 86 Active 78 Active seated 65*; supine 70* Passive supine 80 AAROM 82* seated  A: 77 Supine AROM heel slide 81 PROM heel slide c strap 86 AROM 83  Knee extension A: -10 P: -8 A: -2 A: -8 P: -6 A (LAQ): -5 Active -2 Active seated 22*; supine 11* Active supine 0 degrees Active long sitting 5*    AROM -5   (Blank rows = not tested)   LOWER EXTREMITY MMT:   MMT Right 01/09/22 Left 01/09/22 Right 02/27/2022 Left/Right in pounds 03/08/2022  Hip flexion 4-/5 4-/5    Hip extension        Hip abduction 4/5 4/5    Hip adduction 4/5 4/5    Hip internal rotation        Hip external rotation        Knee  flexion 3+/5 5/5 5/5 44.6/47.2  Knee extension 3+/5 5/5 5/5    (Blank rows = not tested)     FUNCTIONAL TESTS:  01/09/22: 5 time sit to stand: 17 seconds c UE support   GAIT: 02/27/2022:  Independent ambulation c antalgic gait, lacking TKE in stance,   Eval Distance walked: 20 feet Assistive device utilized: Single point cane Level of assistance: Modified  independence Comments: forward  trunk flexion, antalgic gait pattern, increased knee flexion on Rt     TODAY'S TREATMENT   03/20/22  TherEx   PreCor bike for ROM today partial rotations x6 minutes  Forward step ups 6 inch box x10 B U UE support  Lateral step ups 4 inch box x15 B U UE support  Hip hikes x15 B Hip hikes + ABD x10 B  NMR  SLS 3x15 seconds B solid surface  Standing with one foot on 4 inch step 3x30 seconds B  Alternating toe taps on 4 inch box x20 intermittent UE support      03/15/22  TherEx  Scifit x6 minutes seat 9--> seat 8 full rotations Bridges 2x12 Clamshells x10 B red TB SAQs 9# x15 3 second holds  LAQs 9# x10 2 second holds  Tandem stance 2x30 seconds B solid surface  STS with 5# x10 from standard height chair feet even, no UEs  SLS x10 second holds x3 B    03/13/22  TherEx  SciFit x6 minutes full rotations seat 9-->8 for ROM Bridges 2x10 SAQs 9# 2x10  Sidelying knee flexion 4# x10 in available ROM STS with 5# x10 from standard height chair feet even, no UEs    NMR  Semi-tandem stance blue foam pad 3x30 seconds B SBA Narrow BOS on blue foam pad 3x30 seconds SBA       03/10/2022 [place] knee flexion 1 minute AAROM right knee flexion 10X 10 seconds with PT overpressure Quad sets with right heel prop 10X 5 seconds Standing knee flexion on 8 inch step in parallel bars (lunge into step with right foot on step) and standing knee extension (same set up) 10X 5 seconds each  Functional Activities for sit to stand and stairs: Double Leg 62# 15X full extension to full flexion, slow eccentrics Single Leg 50# 15X full extension to full flexion, slow eccentrics Step-up and over 4 inch step with focus on avoiding circumduction and avoiding hip ER for increased knee flexion 10X   Vaso right knee 10 minutes post-exercises 34* Medium Pressure   03/08/2022 [place] knee flexion 1 minute AAROM right knee flexion 10X 10 seconds Quad sets  with right heel prop 10X 5 seconds  Functional Activities for sit to stand and stairs: Double Leg 56# 15X full extension to full flexion, slow eccentrics Single Leg 50# 10X full extension to full flexion, slow eccentrics  Manual flexion with PT overpressure 5 for 10 seconds  Vaso right knee 10 minutes post-exercises 34* Medium Pressure   03/06/2022 TherEx Scifit bike 9 minutes seat 11 full rotations; L1.5 Seated LAQ alternating with 5# on RLE 2x15; 3 sec hold Leg press Double leg 56 lbs x 15, Rt leg only x15 50 lbs c flexion stretch 2-3 seconds each rep.  Incline gastroc stretch 30 sec x 3  Supine heel slide c strap Rt leg 10 sec x 6  Manual Seated Rt knee flexion c distraction/IR mobilization c movement.  Contract/relax techniques for Rt knee flexion.   Modalities Vaso x 10 min; mod pressure with 34 deg; Rt knee  PATIENT EDUCATION:  Education details: ROM measures/progress with PT  Person educated: Patient Education method: Consulting civil engineer, Media planner, Verbal cues, and Handouts Education comprehension: verbalized understanding, returned demonstration, and verbal cues required   HOME EXERCISE PROGRAM:   Access Code: [email or web address] URL: [email or web address] Date: 03/20/2022 Prepared by: Deniece Ree  Exercises - Sit to Stand  - 3 x daily - 7 x weekly - 2 sets - 10 reps - Seated Long Arc Quad  - 3 x daily - 7 x weekly - 2 sets - 10 reps - 5 seconds hold - Supine Heel Slide with Strap  - 3 x daily - 7 x weekly - 2 sets - 10 reps - Supine Straight Leg Raises  - 3 x daily - 7 x weekly - 2 sets - 10 reps - Supine Quad Set  - 3 x daily - 7 x weekly - 2 sets - 10 reps - 5 seconds hold - Supine Quadricep Sets  - 5 x daily - 7 x weekly - 2 sets - 10 reps - 5 second hold - Seated Knee Flexion AAROM  - 5 x daily - 7 x weekly - 1 sets - 1 reps - 3 minutes hold - Supine Bridge  - 1 x daily - 7 x weekly - 3 sets - 10 reps - Clamshell with Resistance  - 1 x daily - 7 x weekly  - 3 sets - 10 reps - Tandem Stance in Corner  - 1 x daily - 7 x weekly - 3 sets - 10 reps - Forward Step Up with Counter Support  - 1 x daily - 7 x weekly - 3 sets - 10 reps - Standing Single Leg Stance with Unilateral Counter Support  - 1 x daily - 7 x weekly - 3 sets - 10 reps  ASSESSMENT:   CLINICAL IMPRESSION:   Mckenleigh arrives today doing well, sounds like water exercise is still helping her quite a bit. Continued with focus on the functional today. Only has 2 more visits after today, will continue to build HEP and progress in PT as able. Still on track for upcoming DC.   OBJECTIVE IMPAIRMENTS: Abnormal gait, decreased activity tolerance, decreased balance, decreased mobility, difficulty walking, decreased ROM, decreased strength, increased edema, impaired flexibility, and pain.    ACTIVITY LIMITATIONS: lifting, bending, sitting, standing, squatting, sleeping, stairs, and transfers   PARTICIPATION LIMITATIONS: cleaning, driving, and community activity   PERSONAL FACTORS: 3+ comorbidities: see above  are also affecting patient's functional outcome.    REHAB POTENTIAL: Good   CLINICAL DECISION MAKING: Stable/uncomplicated   EVALUATION COMPLEXITY: Low     GOALS: Goals reviewed with patient? Yes   SHORT TERM GOALS: (target date for Short term goals are 3 weeks 02/03/22)    1.  Patient will demonstrate independent use of home exercise program to maintain progress from in clinic treatments.   Goal status: Met 02/16/2022   2.  Pt will be able to perform 5 time sit to stand in </= 12 seconds c/s UE support.                         Goal status: Met 1/24/204   LONG TERM GOALS: (target dates for all long term goals are 12 weeks  04/07/22 )   1. Patient will demonstrate/report pain at worst less than or equal to 2/10 to facilitate minimal limitation in daily activity secondary to pain symptoms.   Goal status: Met 03/08/2022   2. Patient  will demonstrate independent use of home  exercise program to facilitate ability to maintain/progress functional gains from skilled physical therapy services.   Goal status: On Going 03/10/2022   3. Patient will demonstrate FOTO outcome > or = 56 % to indicate reduced disability due to condition.   Goal status: On Going 03/08/2022   4.  Patient will demonstrate Rt  LE MMT >/= 4+/5 throughout to faciltiate usual transfers, stairs, squatting at [facility] for daily life.  Goal status: Met 03/08/2022   5.  Patient will be able to amb > 800 feet with LRAD with normalized gait pattern on community surfaces.    Goal status: On Going 03/10/2022   6.  Pt will be able to navigate up and down 5 stairs with single hand rail with step over step pattern.    Goal status: Met 03/08/2022   7. Pt will improve her Rt knee flexion to >/= 100 degrees for improved functional mobility.              Goal status: On Going 03/10/2022    PLAN:   PT FREQUENCY: 2-3X/week for 2-3 weeks   PT DURATION: 2-3 weeks   PLANNED INTERVENTIONS: Therapeutic exercises, Therapeutic activity, Neuro Muscular re-education, Balance training, Gait training, Patient/Family education, Joint mobilization, Stair training, DME instructions, Dry Needling, Electrical stimulation, Traction, Cryotherapy, vasopneumatic deviceMoist heat, Taping, Ultrasound, Ionotophoresis '4mg'$ /ml Dexamethasone, and Manual therapy.  All included unless contraindicated   PLAN FOR NEXT SESSION:  still work on flexion ROM, still work on Cytogeneticist and activities as well. DC 03/27/22 per patient request, update HEP before DC      Deniece Ree PT DPT PN2  03/20/2022, 2:29 PM

## 2022-03-22 ENCOUNTER — Ambulatory Visit (INDEPENDENT_AMBULATORY_CARE_PROVIDER_SITE_OTHER): Payer: Medicare Other | Admitting: Physical Therapy

## 2022-03-22 ENCOUNTER — Encounter: Payer: Self-pay | Admitting: Physical Therapy

## 2022-03-22 DIAGNOSIS — M25661 Stiffness of right knee, not elsewhere classified: Secondary | ICD-10-CM

## 2022-03-22 DIAGNOSIS — R262 Difficulty in walking, not elsewhere classified: Secondary | ICD-10-CM

## 2022-03-22 DIAGNOSIS — M25561 Pain in right knee: Secondary | ICD-10-CM | POA: Diagnosis not present

## 2022-03-22 DIAGNOSIS — R600 Localized edema: Secondary | ICD-10-CM | POA: Diagnosis not present

## 2022-03-22 NOTE — Therapy (Signed)
OUTPATIENT PHYSICAL THERAPY TREATMENT NOTE   Patient Name: Kelly [name] MRN: [id number] DOB:1945-07-04, 77 y.o., female Today's Date: 03/22/2022  PCP: Elby Showers MD  REFERRING PROVIDER: Mcarthur Rossetti, MD    END OF SESSION:   PT End of Session - 03/22/22 1349     Visit Number 25    Number of Visits 28    Date for PT Re-Evaluation 03/27/22    Authorization Type Medicare/BCBS    Progress Note Due on Visit 30    PT Start Time 2836    PT Stop Time 1425    PT Time Calculation (min) 40 min    Activity Tolerance Patient tolerated treatment well;No increased pain    Behavior During Therapy WFL for tasks assessed/performed                 Past Medical History:  Diagnosis Date   Arthritis    Cataract    Chronic kidney disease    Depression    Hyperlipidemia    Hypertension    Osteoporosis    Past Surgical History:  Procedure Laterality Date   ABDOMINAL HYSTERECTOMY     BREAST REDUCTION SURGERY     and lift   BREAST REDUCTION SURGERY Bilateral    CARPAL TUNNEL RELEASE  06/2009   right   CATARACT EXTRACTION     CHOLECYSTECTOMY     FACIAL COSMETIC SURGERY  03/2008   KNEE ARTHROSCOPY  2007   left & right   KNEE CLOSED REDUCTION Right 02/09/2022   Procedure: CLOSED MANIPULATION RIGHT TOTAL KNEE ARTHROPLASTY;  Surgeon: Mcarthur Rossetti, MD;  Location: [facility];  Service: Orthopedics;  Laterality: Right;   LIPOSUCTION EXTREMITIES     thighs   PARTIAL KNEE ARTHROPLASTY Right 10/02/2014   Procedure: RIGHT KNEE MEDIAL UNICOMPARTMENTAL ARTHROPLASTY;  Surgeon: Mcarthur Rossetti, MD;  Location: [facility];  Service: Orthopedics;  Laterality: Right;   REDUCTION MAMMAPLASTY     bilateral   TOTAL KNEE REVISION  10/12/2011   Procedure: TOTAL KNEE REVISION;  Surgeon: Mcarthur Rossetti, MD;  Location: [facility];  Service: Orthopedics;  Laterality: Left;  Left Total Knee Revision Arthroplasty   TOTAL KNEE REVISION Right 12/09/2021   Procedure: CONVERT  RIGHT UNI KNEE ARTHROPLASTY TO TOTAL KNEE ARTHROPLASTY;  Surgeon: Mcarthur Rossetti, MD;  Location: [facility];  Service: Orthopedics;  Laterality: Right;   TRIGGER FINGER RELEASE  06/2009   right 4th finger   Patient Active Problem List   Diagnosis Date Noted   Arthrofibrosis of right total knee arthroplasty ([place]) 02/09/2022   Status post revision of total knee, right 12/09/2021   Arthritis of right knee 12/08/2021   Chronic left-sided low back pain with left-sided sciatica 12/17/2019   Diabetes mellitus type 2 in obese ([place]) 12/13/2019   Chronic rhinitis 03/03/2019   Constipation 10/31/2016   Impaired glucose tolerance 10/31/2016   Stage III chronic kidney disease ([place]) 10/31/2016   Urge urinary incontinence 10/31/2016   Status post right partial knee replacement 10/02/2014   Anxiety and depression 11/06/2011   Hypertension 10/13/2010   Hyperlipidemia 10/13/2010   Osteoarthritis 10/13/2010   UNSPECIFIED IRON DEFICIENCY ANEMIA 04/11/2010   DIARRHEA 04/11/2010    REFERRING DIAG: O29.476 (ICD-10-CM) - Status post revision of total knee, right   THERAPY DIAG:  Acute pain of right knee  Difficulty in walking, not elsewhere classified  Stiffness of right knee, not elsewhere classified  Localized edema  Rationale for Evaluation and Treatment Rehabilitation  PERTINENT HISTORY:  Rt  TKA 12/09/21 Rt knee surgery: uni knee arthroplasty injection 09/16/2020   hyperlipidemia, depression, HTN, osteoporosis  PRECAUTIONS:  none  SUBJECTIVE:                                                                                                                                                                                      SUBJECTIVE STATEMENT:   I'm going to water exercise still, balance is the biggest concern at this point for me. Would like to focus on this the next couple of sessions. Even balance in the water is hard for me that's what's concerning for me.   PAIN:  NPRS scale:  0/10   OBJECTIVE: (objective measures completed at initial evaluation unless otherwise dated)  DIAGNOSTIC FINDINGS: 12/09/21 IMPRESSION: 1. Right total knee arthroplasty revision without acute postoperative complication.images following TKA    PATIENT SURVEYS:  03/08/2021: FOTO 53 (was 46, Goal 56)  02/10/2022: FOTO 53 (was 46, Goal 56)  01/09/22: FOTO intake:  46%   COGNITION: Overall cognitive status: WFL                  SENSATION: WFL:    EDEMA:  01/09/22: Circumferential: Rt:  50 centimeters                           Left: 46 centimeters  MUSCLE LENGTH: 01/09/22: Hamstrings: Right 75 deg; Left 84 deg     POSTURE: rounded shoulders and forward head   PALPATION: 01/09/22: TTP: both sides of incision site on Rt knee    LOWER EXTREMITY ROM:    ROM A: active,  P: passive Right 01/09/22 Left 01/09/22 Rt 01/11/22 Right 01/20/22 Right 02/10/2022 Post-manipulation Rt 02/15/22 Right 02/16/2022 Right  02/20/2022 Right 02/24/22 Rt 03/06/2022 Right 03/08/2022 Right 03/22/22  Hip flexion                Knee flexion A: 76 P: 80 A: 94 A: 76 P: 84 A: 80 P: 86 Active 78 Active seated 65*; supine 70* Passive supine 80 AAROM 82* seated  A: 77 Supine AROM heel slide 81 PROM heel slide c strap 86 AROM 83 AROM 78* standing box stretch, AAROM 81* sitting   Knee extension A: -10 P: -8 A: -2 A: -8 P: -6 A (LAQ): -5 Active -2 Active seated 22*; supine 11* Active supine 0 degrees Active long sitting 5*    AROM -5 AROM LAQ -8*   (Blank rows = not tested)   LOWER EXTREMITY MMT:   MMT Right 01/09/22 Left 01/09/22 Right 02/27/2022 Left/Right in pounds 03/08/2022  Hip flexion 4-/5 4-/5    Hip extension  Hip abduction 4/5 4/5    Hip adduction 4/5 4/5    Hip internal rotation        Hip external rotation        Knee flexion 3+/5 5/5 5/5 44.6/47.2  Knee extension 3+/5 5/5 5/5    (Blank rows = not tested)     FUNCTIONAL TESTS:  01/09/22: 5 time sit to stand: 17 seconds c UE  support   GAIT: 02/27/2022:  Independent ambulation c antalgic gait, lacking TKE in stance,   Eval Distance walked: 20 feet Assistive device utilized: Single point cane Level of assistance: Modified independence Comments: forward  trunk flexion, antalgic gait pattern, increased knee flexion on Rt     TODAY'S TREATMENT   03/22/22  TherEx  Scifit bike seat 11--> 10 x6 minutes for ROM    NMR  SLS with one foot on 6 inch box 3x30 seconds B  Tandem gait solid surface x4 laps in // bars  Lateral step overs over orange cones in // bars x3 rounds Rocker board lateral rocks x2.5 minutes Rocker board AP rocks x2 minutes  Sidestepping on blue foam pad x4 in // bars    03/20/22  TherEx   PreCor bike for ROM today partial rotations x6 minutes  Forward step ups 6 inch box x10 B U UE support  Lateral step ups 4 inch box x15 B U UE support  Hip hikes x15 B Hip hikes + ABD x10 B  NMR  SLS 3x15 seconds B solid surface  Standing with one foot on 4 inch step 3x30 seconds B  Alternating toe taps on 4 inch box x20 intermittent UE support      03/15/22  TherEx  Scifit x6 minutes seat 9--> seat 8 full rotations Bridges 2x12 Clamshells x10 B red TB SAQs 9# x15 3 second holds  LAQs 9# x10 2 second holds  Tandem stance 2x30 seconds B solid surface  STS with 5# x10 from standard height chair feet even, no UEs  SLS x10 second holds x3 B    03/13/22  TherEx  SciFit x6 minutes full rotations seat 9-->8 for ROM Bridges 2x10 SAQs 9# 2x10  Sidelying knee flexion 4# x10 in available ROM STS with 5# x10 from standard height chair feet even, no UEs    NMR  Semi-tandem stance blue foam pad 3x30 seconds B SBA Narrow BOS on blue foam pad 3x30 seconds SBA       03/10/2022 [place] knee flexion 1 minute AAROM right knee flexion 10X 10 seconds with PT overpressure Quad sets with right heel prop 10X 5 seconds Standing knee flexion on 8 inch step in parallel bars (lunge  into step with right foot on step) and standing knee extension (same set up) 10X 5 seconds each  Functional Activities for sit to stand and stairs: Double Leg 62# 15X full extension to full flexion, slow eccentrics Single Leg 50# 15X full extension to full flexion, slow eccentrics Step-up and over 4 inch step with focus on avoiding circumduction and avoiding hip ER for increased knee flexion 10X   Vaso right knee 10 minutes post-exercises 34* Medium Pressure   03/08/2022 [place] knee flexion 1 minute AAROM right knee flexion 10X 10 seconds Quad sets with right heel prop 10X 5 seconds  Functional Activities for sit to stand and stairs: Double Leg 56# 15X full extension to full flexion, slow eccentrics Single Leg 50# 10X full extension to full flexion, slow eccentrics  Manual flexion with PT overpressure 5  for 10 seconds  Vaso right knee 10 minutes post-exercises 34* Medium Pressure   03/06/2022 TherEx Scifit bike 9 minutes seat 11 full rotations; L1.5 Seated LAQ alternating with 5# on RLE 2x15; 3 sec hold Leg press Double leg 56 lbs x 15, Rt leg only x15 50 lbs c flexion stretch 2-3 seconds each rep.  Incline gastroc stretch 30 sec x 3  Supine heel slide c strap Rt leg 10 sec x 6  Manual Seated Rt knee flexion c distraction/IR mobilization c movement.  Contract/relax techniques for Rt knee flexion.   Modalities Vaso x 10 min; mod pressure with 34 deg; Rt knee   PATIENT EDUCATION:  Education details: ROM measures/progress with PT  Person educated: Patient Education method: Consulting civil engineer, Media planner, Verbal cues, and Handouts Education comprehension: verbalized understanding, returned demonstration, and verbal cues required   HOME EXERCISE PROGRAM:   Access Code: [email or web address] URL: [email or web address] Date: 03/20/2022 Prepared by: Deniece Ree  Exercises - Sit to Stand  - 3 x daily - 7 x weekly - 2 sets - 10 reps - Seated Long Arc Quad  - 3 x daily - 7 x  weekly - 2 sets - 10 reps - 5 seconds hold - Supine Heel Slide with Strap  - 3 x daily - 7 x weekly - 2 sets - 10 reps - Supine Straight Leg Raises  - 3 x daily - 7 x weekly - 2 sets - 10 reps - Supine Quad Set  - 3 x daily - 7 x weekly - 2 sets - 10 reps - 5 seconds hold - Supine Quadricep Sets  - 5 x daily - 7 x weekly - 2 sets - 10 reps - 5 second hold - Seated Knee Flexion AAROM  - 5 x daily - 7 x weekly - 1 sets - 1 reps - 3 minutes hold - Supine Bridge  - 1 x daily - 7 x weekly - 3 sets - 10 reps - Clamshell with Resistance  - 1 x daily - 7 x weekly - 3 sets - 10 reps - Tandem Stance in Corner  - 1 x daily - 7 x weekly - 3 sets - 10 reps - Forward Step Up with Counter Support  - 1 x daily - 7 x weekly - 3 sets - 10 reps - Standing Single Leg Stance with Unilateral Counter Support  - 1 x daily - 7 x weekly - 3 sets - 10 reps  ASSESSMENT:   CLINICAL IMPRESSION:   Georgette arrives today doing well, main concern at this point is her balance. Continued working on ROM on the bike, otherwise focused on balance per her request. Tells me she really is quite satisfied with current level of function besides her balance. ROM still very limited and she is frustrated over this, but able to do what she needs in general functionally with the exception of her balance. Will still plan for DC next session.   OBJECTIVE IMPAIRMENTS: Abnormal gait, decreased activity tolerance, decreased balance, decreased mobility, difficulty walking, decreased ROM, decreased strength, increased edema, impaired flexibility, and pain.    ACTIVITY LIMITATIONS: lifting, bending, sitting, standing, squatting, sleeping, stairs, and transfers   PARTICIPATION LIMITATIONS: cleaning, driving, and community activity   PERSONAL FACTORS: 3+ comorbidities: see above  are also affecting patient's functional outcome.    REHAB POTENTIAL: Good   CLINICAL DECISION MAKING: Stable/uncomplicated   EVALUATION COMPLEXITY: Low      GOALS: Goals reviewed with patient? Yes   SHORT TERM GOALS: (  target date for Short term goals are 3 weeks 02/03/22)    1.  Patient will demonstrate independent use of home exercise program to maintain progress from in clinic treatments.   Goal status: Met 02/16/2022   2.  Pt will be able to perform 5 time sit to stand in </= 12 seconds c/s UE support.                         Goal status: Met 1/24/204   LONG TERM GOALS: (target dates for all long term goals are 12 weeks  04/07/22 )   1. Patient will demonstrate/report pain at worst less than or equal to 2/10 to facilitate minimal limitation in daily activity secondary to pain symptoms.   Goal status: Met 03/08/2022   2. Patient will demonstrate independent use of home exercise program to facilitate ability to maintain/progress functional gains from skilled physical therapy services.   Goal status: On Going 03/10/2022   3. Patient will demonstrate FOTO outcome > or = 56 % to indicate reduced disability due to condition.   Goal status: On Going 03/08/2022   4.  Patient will demonstrate Rt  LE MMT >/= 4+/5 throughout to faciltiate usual transfers, stairs, squatting at [facility] for daily life.  Goal status: Met 03/08/2022   5.  Patient will be able to amb > 800 feet with LRAD with normalized gait pattern on community surfaces.    Goal status: On Going 03/10/2022   6.  Pt will be able to navigate up and down 5 stairs with single hand rail with step over step pattern.    Goal status: Met 03/08/2022   7. Pt will improve her Rt knee flexion to >/= 100 degrees for improved functional mobility.              Goal status: On Going 03/10/2022    PLAN:   PT FREQUENCY: 2-3X/week for 2-3 weeks   PT DURATION: 2-3 weeks   PLANNED INTERVENTIONS: Therapeutic exercises, Therapeutic activity, Neuro Muscular re-education, Balance training, Gait training, Patient/Family education, Joint mobilization, Stair training, DME instructions, Dry Needling,  Electrical stimulation, Traction, Cryotherapy, vasopneumatic deviceMoist heat, Taping, Ultrasound, Ionotophoresis '4mg'$ /ml Dexamethasone, and Manual therapy.  All included unless contraindicated   PLAN FOR NEXT SESSION:   DC 03/27/22 per patient request, update HEP before DC, more balance training if time      Deniece Ree PT DPT PN2  03/22/2022, 2:27 PM

## 2022-03-27 ENCOUNTER — Encounter: Payer: Self-pay | Admitting: Physical Therapy

## 2022-03-27 ENCOUNTER — Ambulatory Visit (INDEPENDENT_AMBULATORY_CARE_PROVIDER_SITE_OTHER): Payer: Medicare Other | Admitting: Physical Therapy

## 2022-03-27 DIAGNOSIS — M25661 Stiffness of right knee, not elsewhere classified: Secondary | ICD-10-CM

## 2022-03-27 DIAGNOSIS — R600 Localized edema: Secondary | ICD-10-CM

## 2022-03-27 DIAGNOSIS — M25561 Pain in right knee: Secondary | ICD-10-CM | POA: Diagnosis not present

## 2022-03-27 DIAGNOSIS — R262 Difficulty in walking, not elsewhere classified: Secondary | ICD-10-CM | POA: Diagnosis not present

## 2022-03-27 NOTE — Therapy (Signed)
OUTPATIENT PHYSICAL THERAPY TREATMENT NOTE DISCHARGE SUMMARY   Patient Name: Kelly [name] MRN: [id number] DOB:20-Jun-1945, 77 y.o., female Today's Date: 03/27/2022  PCP: Elby Showers MD  REFERRING PROVIDER: Mcarthur Rossetti, MD    END OF SESSION:   PT End of Session - 03/27/22 1347     Visit Number 26    Date for PT Re-Evaluation 03/27/22    Authorization Type Medicare/BCBS    Progress Note Due on Visit 30    PT Start Time [id number]    PT Stop Time 1400    PT Time Calculation (min) 15 min    Activity Tolerance Patient tolerated treatment well;No increased pain    Behavior During Therapy WFL for tasks assessed/performed                  Past Medical History:  Diagnosis Date   Arthritis    Cataract    Chronic kidney disease    Depression    Hyperlipidemia    Hypertension    Osteoporosis    Past Surgical History:  Procedure Laterality Date   ABDOMINAL HYSTERECTOMY     BREAST REDUCTION SURGERY     and lift   BREAST REDUCTION SURGERY Bilateral    CARPAL TUNNEL RELEASE  06/2009   right   CATARACT EXTRACTION     CHOLECYSTECTOMY     FACIAL COSMETIC SURGERY  03/2008   KNEE ARTHROSCOPY  2007   left & right   KNEE CLOSED REDUCTION Right 02/09/2022   Procedure: CLOSED MANIPULATION RIGHT TOTAL KNEE ARTHROPLASTY;  Surgeon: Mcarthur Rossetti, MD;  Location: [place];  Service: Orthopedics;  Laterality: Right;   LIPOSUCTION EXTREMITIES     thighs   PARTIAL KNEE ARTHROPLASTY Right 10/02/2014   Procedure: RIGHT KNEE MEDIAL UNICOMPARTMENTAL ARTHROPLASTY;  Surgeon: Mcarthur Rossetti, MD;  Location: [facility];  Service: Orthopedics;  Laterality: Right;   REDUCTION MAMMAPLASTY     bilateral   TOTAL KNEE REVISION  10/12/2011   Procedure: TOTAL KNEE REVISION;  Surgeon: Mcarthur Rossetti, MD;  Location: [facility];  Service: Orthopedics;  Laterality: Left;  Left Total Knee Revision Arthroplasty   TOTAL KNEE REVISION Right 12/09/2021   Procedure: CONVERT  RIGHT UNI KNEE ARTHROPLASTY TO TOTAL KNEE ARTHROPLASTY;  Surgeon: Mcarthur Rossetti, MD;  Location: [facility];  Service: Orthopedics;  Laterality: Right;   TRIGGER FINGER RELEASE  06/2009   right 4th finger   Patient Active Problem List   Diagnosis Date Noted   Arthrofibrosis of right total knee arthroplasty ([place]) 02/09/2022   Status post revision of total knee, right 12/09/2021   Arthritis of right knee 12/08/2021   Chronic left-sided low back pain with left-sided sciatica 12/17/2019   Diabetes mellitus type 2 in obese ([place]) 12/13/2019   Chronic rhinitis 03/03/2019   Constipation 10/31/2016   Impaired glucose tolerance 10/31/2016   Stage III chronic kidney disease ([place]) 10/31/2016   Urge urinary incontinence 10/31/2016   Status post right partial knee replacement 10/02/2014   Anxiety and depression 11/06/2011   Hypertension 10/13/2010   Hyperlipidemia 10/13/2010   Osteoarthritis 10/13/2010   UNSPECIFIED IRON DEFICIENCY ANEMIA 04/11/2010   DIARRHEA 04/11/2010    REFERRING DIAG: [id number] (ICD-10-CM) - Status post revision of total knee, right   THERAPY DIAG:  Acute pain of right knee  Difficulty in walking, not elsewhere classified  Stiffness of right knee, not elsewhere classified  Localized edema  Rationale for Evaluation and Treatment Rehabilitation  PERTINENT HISTORY:  Rt TKA 12/09/21 Rt knee  surgery: uni knee arthroplasty injection 09/16/2020   hyperlipidemia, depression, HTN, osteoporosis  PRECAUTIONS:  none  SUBJECTIVE:                                                                                                                                                                                      SUBJECTIVE STATEMENT:  Went to the [facility] for a show and was very uncomfortable, but managed to do okay.      PAIN:  NPRS scale: 0/10   OBJECTIVE: (objective measures completed at initial evaluation unless otherwise dated)  DIAGNOSTIC FINDINGS: 12/09/21  IMPRESSION: 1. Right total knee arthroplasty revision without acute postoperative complication.images following TKA    PATIENT SURVEYS:  03/27/22: FOTO 91  03/08/2021: FOTO 53 (was 46, Goal 56)  02/10/2022: FOTO 53 (was 46, Goal 56)  01/09/22: FOTO intake:  46%   COGNITION: Overall cognitive status: WFL                  SENSATION: WFL:    EDEMA:  01/09/22: Circumferential: Rt:  50 centimeters                           Left: 46 centimeters  MUSCLE LENGTH: 01/09/22: Hamstrings: Right 75 deg; Left 84 deg     POSTURE: rounded shoulders and forward head   PALPATION: 01/09/22: TTP: both sides of incision site on Rt knee    LOWER EXTREMITY ROM:    ROM A: active,  P: passive Right 01/09/22 Left 01/09/22 Rt 01/11/22 Right 01/20/22 Right 02/10/2022 Post-manipulation Rt 02/15/22 Right 02/16/2022 Right  02/20/2022 Right 02/24/22 Rt 03/06/2022 Right 03/08/2022 Right 03/22/22 Right 03/27/22  Knee flexion A: 76 P: 80 A: 94 A: 76 P: 84 A: 80 P: 86 Active 78 Active seated 65*; supine 70* Passive supine 80 AAROM 82* seated  A: 77 Supine AROM heel slide 81 PROM heel slide c strap 86 AROM 83 AROM 78* standing box stretch, AAROM 81* sitting  A: 81 (Supine)  Knee extension A: -10 P: -8 A: -2 A: -8 P: -6 A (LAQ): -5 Active -2 Active seated 22*; supine 11* Active supine 0 degrees Active long sitting 5*    AROM -5 AROM LAQ -8*    (Blank rows = not tested)   LOWER EXTREMITY MMT:   MMT Right 01/09/22 Left 01/09/22 Right 02/27/2022 Left/Right in pounds 03/08/2022  Hip flexion 4-/5 4-/5    Hip extension        Hip abduction 4/5 4/5    Hip adduction 4/5 4/5    Hip internal rotation        Hip external rotation  Knee flexion 3+/5 5/5 5/5 44.6/47.2  Knee extension 3+/5 5/5 5/5    (Blank rows = not tested)     FUNCTIONAL TESTS:  01/09/22: 5 time sit to stand: 17 seconds c UE support   GAIT: 02/27/2022:  Independent ambulation c antalgic gait, lacking TKE in stance,    Eval Distance walked: 20 feet Assistive device utilized: Single point cane Level of assistance: Modified independence Comments: forward  trunk flexion, antalgic gait pattern, increased knee flexion on Rt     TODAY'S TREATMENT  03/27/22 TherEx AA heel slides x 10 reps ROM measurements - see above Final goal assessment of unmet goals  Recommended continued exercise and activity, water exercise to maintain current progress   03/22/22  TherEx  Scifit bike seat 11--> 10 x6 minutes for ROM  NMR  SLS with one foot on 6 inch box 3x30 seconds B  Tandem gait solid surface x4 laps in // bars  Lateral step overs over orange cones in // bars x3 rounds Rocker board lateral rocks x2.5 minutes Rocker board AP rocks x2 minutes  Sidestepping on blue foam pad x4 in // bars    03/20/22  TherEx   PreCor bike for ROM today partial rotations x6 minutes  Forward step ups 6 inch box x10 B U UE support  Lateral step ups 4 inch box x15 B U UE support  Hip hikes x15 B Hip hikes + ABD x10 B  NMR  SLS 3x15 seconds B solid surface  Standing with one foot on 4 inch step 3x30 seconds B  Alternating toe taps on 4 inch box x20 intermittent UE support      03/15/22  TherEx  Scifit x6 minutes seat 9--> seat 8 full rotations Bridges 2x12 Clamshells x10 B red TB SAQs 9# x15 3 second holds  LAQs 9# x10 2 second holds  Tandem stance 2x30 seconds B solid surface  STS with 5# x10 from standard height chair feet even, no UEs  SLS x10 second holds x3 B    03/13/22  TherEx  SciFit x6 minutes full rotations seat 9-->8 for ROM Bridges 2x10 SAQs 9# 2x10  Sidelying knee flexion 4# x10 in available ROM STS with 5# x10 from standard height chair feet even, no UEs    NMR  Semi-tandem stance blue foam pad 3x30 seconds B SBA Narrow BOS on blue foam pad 3x30 seconds SBA       03/10/2022 [place] knee flexion 1 minute AAROM right knee flexion 10X 10 seconds with PT overpressure Quad  sets with right heel prop 10X 5 seconds Standing knee flexion on 8 inch step in parallel bars (lunge into step with right foot on step) and standing knee extension (same set up) 10X 5 seconds each  Functional Activities for sit to stand and stairs: Double Leg 62# 15X full extension to full flexion, slow eccentrics Single Leg 50# 15X full extension to full flexion, slow eccentrics Step-up and over 4 inch step with focus on avoiding circumduction and avoiding hip ER for increased knee flexion 10X   Vaso right knee 10 minutes post-exercises 34* Medium Pressure   03/08/2022 [place] knee flexion 1 minute AAROM right knee flexion 10X 10 seconds Quad sets with right heel prop 10X 5 seconds  Functional Activities for sit to stand and stairs: Double Leg 56# 15X full extension to full flexion, slow eccentrics Single Leg 50# 10X full extension to full flexion, slow eccentrics  Manual flexion with PT overpressure 5 for 10 seconds  Vaso right knee 10 minutes post-exercises 34* Medium Pressure   03/06/2022 TherEx Scifit bike 9 minutes seat 11 full rotations; L1.5 Seated LAQ alternating with 5# on RLE 2x15; 3 sec hold Leg press Double leg 56 lbs x 15, Rt leg only x15 50 lbs c flexion stretch 2-3 seconds each rep.  Incline gastroc stretch 30 sec x 3  Supine heel slide c strap Rt leg 10 sec x 6  Manual Seated Rt knee flexion c distraction/IR mobilization c movement.  Contract/relax techniques for Rt knee flexion.   Modalities Vaso x 10 min; mod pressure with 34 deg; Rt knee   PATIENT EDUCATION:  Education details: ROM measures/progress with PT  Person educated: Patient Education method: Consulting civil engineer, Media planner, Verbal cues, and Handouts Education comprehension: verbalized understanding, returned demonstration, and verbal cues required   HOME EXERCISE PROGRAM:   Access Code: [email or web address] URL: [email or web address] Date: 03/20/2022 Prepared by: Deniece Ree  Exercises - Sit to Stand  - 3 x daily - 7 x weekly - 2 sets - 10 reps - Seated Long Arc Quad  - 3 x daily - 7 x weekly - 2 sets - 10 reps - 5 seconds hold - Supine Heel Slide with Strap  - 3 x daily - 7 x weekly - 2 sets - 10 reps - Supine Straight Leg Raises  - 3 x daily - 7 x weekly - 2 sets - 10 reps - Supine Quad Set  - 3 x daily - 7 x weekly - 2 sets - 10 reps - 5 seconds hold - Supine Quadricep Sets  - 5 x daily - 7 x weekly - 2 sets - 10 reps - 5 second hold - Seated Knee Flexion AAROM  - 5 x daily - 7 x weekly - 1 sets - 1 reps - 3 minutes hold - Supine Bridge  - 1 x daily - 7 x weekly - 3 sets - 10 reps - Clamshell with Resistance  - 1 x daily - 7 x weekly - 3 sets - 10 reps - Tandem Stance in Corner  - 1 x daily - 7 x weekly - 3 sets - 10 reps - Forward Step Up with Counter Support  - 1 x daily - 7 x weekly - 3 sets - 10 reps - Standing Single Leg Stance with Unilateral Counter Support  - 1 x daily - 7 x weekly - 3 sets - 10 reps  ASSESSMENT:   CLINICAL IMPRESSION:  Pt has met all goals except ROM goal at this time. She has returned to her usual functional tasks and is ready for d/c.  Will d/c PT today.   OBJECTIVE IMPAIRMENTS: Abnormal gait, decreased activity tolerance, decreased balance, decreased mobility, difficulty walking, decreased ROM, decreased strength, increased edema, impaired flexibility, and pain.    ACTIVITY LIMITATIONS: lifting, bending, sitting, standing, squatting, sleeping, stairs, and transfers   PARTICIPATION LIMITATIONS: cleaning, driving, and community activity   PERSONAL FACTORS: 3+ comorbidities: see above  are also affecting patient's functional outcome.    REHAB POTENTIAL: Good   CLINICAL DECISION MAKING: Stable/uncomplicated   EVALUATION COMPLEXITY: Low     GOALS: Goals reviewed with patient? Yes   SHORT TERM GOALS: (target date for Short term goals are 3 weeks 02/03/22)    1.  Patient will demonstrate independent use of home  exercise program to maintain progress from in clinic treatments.   Goal status: Met 02/16/2022   2.  Pt will be able to perform 5 time sit to  stand in </= 12 seconds c/s UE support.                       Goal status: Met 1/24/204   LONG TERM GOALS: (target dates for all long term goals are 12 weeks  04/07/22 )   1. Patient will demonstrate/report pain at worst less than or equal to 2/10 to facilitate minimal limitation in daily activity secondary to pain symptoms.   Goal status: Met 03/08/2022   2. Patient will demonstrate independent use of home exercise program to facilitate ability to maintain/progress functional gains from skilled physical therapy services.   Goal status:  Met 03/27/22   3. Patient will demonstrate FOTO outcome > or = 56 % to indicate reduced disability due to condition.   Goal status: Met 03/27/22    4.  Patient will demonstrate Rt  LE MMT >/= 4+/5 throughout to faciltiate usual transfers, stairs, squatting at [facility] for daily life.  Goal status: Met 03/08/2022   5.  Patient will be able to amb > 800 feet with LRAD with normalized gait pattern on community surfaces.    Goal status:  Met 03/27/22   6.  Pt will be able to navigate up and down 5 stairs with single hand rail with step over step pattern.    Goal status: Met 03/08/2022   7. Pt will improve her Rt knee flexion to >/= 100 degrees for improved functional mobility.              Goal status: Not Met 03/27/22    PLAN:   PT FREQUENCY: 2-3X/week for 2-3 weeks   PT DURATION: 2-3 weeks   PLANNED INTERVENTIONS: Therapeutic exercises, Therapeutic activity, Neuro Muscular re-education, Balance training, Gait training, Patient/Family education, Joint mobilization, Stair training, DME instructions, Dry Needling, Electrical stimulation, Traction, Cryotherapy, vasopneumatic deviceMoist heat, Taping, Ultrasound, Ionotophoresis 41m/ml Dexamethasone, and Manual therapy.  All included unless contraindicated   PLAN FOR  NEXT SESSION:   DC 03/27/22     SLaureen Abrahams PT, DPT 03/27/22 2:02 PM   PHYSICAL THERAPY DISCHARGE SUMMARY  Visits from Start of Care: 26  Current functional level related to goals / functional outcomes: See above   Remaining deficits: See above   Education / Equipment: HEP   Patient agrees to discharge. Patient goals were partially met. Patient is being discharged due to being pleased with the current functional level.   SLaureen Abrahams PT, DPT 03/27/22 2:02 PM  [facility] Center[facility] [address]GAnna [address] [address]Phone: [phone]  Fax:  [phone]

## 2022-03-28 DIAGNOSIS — N3281 Overactive bladder: Secondary | ICD-10-CM | POA: Diagnosis not present

## 2022-03-28 DIAGNOSIS — R350 Frequency of micturition: Secondary | ICD-10-CM | POA: Diagnosis not present

## 2022-03-28 DIAGNOSIS — R351 Nocturia: Secondary | ICD-10-CM | POA: Diagnosis not present

## 2022-03-29 ENCOUNTER — Encounter: Payer: Self-pay | Admitting: Orthopaedic Surgery

## 2022-03-29 ENCOUNTER — Ambulatory Visit (INDEPENDENT_AMBULATORY_CARE_PROVIDER_SITE_OTHER): Payer: Medicare Other

## 2022-03-29 ENCOUNTER — Ambulatory Visit (INDEPENDENT_AMBULATORY_CARE_PROVIDER_SITE_OTHER): Payer: Medicare Other | Admitting: Orthopaedic Surgery

## 2022-03-29 DIAGNOSIS — Z96651 Presence of right artificial knee joint: Secondary | ICD-10-CM | POA: Diagnosis not present

## 2022-03-29 NOTE — Progress Notes (Signed)
The patient is now 3 and half months status post a right total knee arthroplasty.  Her postoperative course was complicated by arthrofibrosis and we had to take her to the operating room and manipulate her knee under anesthesia.  She still has significant limitations of flexion of that knee as well as her left knee which had a revision arthroplasty of it sometime ago.  She has excellent attitude and it is 77 years old says that she is really doing well and this is good for her and is "where her body wants her to be".  She walks without assistive ice.  She is enrolling in a course to help with her balance and coordination and to prevent her from being a fall risk.  On exam today of her right knee there is still swelling to be expected.  Extension is almost full and I can flex her to maybe 80 degrees.  I then pushed her little bit further beyond that.  This is actually where her other knee is as well on the left side.  2 views of the right knee show well-seated total knee arthroplasty with no complicating features.  At this point the next time I need to see her is not for 6 months unless she is having any issues.  We will not x-ray her at that visit unless there are any issues.

## 2022-03-31 DIAGNOSIS — R350 Frequency of micturition: Secondary | ICD-10-CM | POA: Diagnosis not present

## 2022-04-20 ENCOUNTER — Encounter: Payer: Self-pay | Admitting: Radiology

## 2022-04-24 DIAGNOSIS — M5032 Other cervical disc degeneration, mid-cervical region, unspecified level: Secondary | ICD-10-CM | POA: Diagnosis not present

## 2022-04-24 DIAGNOSIS — M9901 Segmental and somatic dysfunction of cervical region: Secondary | ICD-10-CM | POA: Diagnosis not present

## 2022-04-24 DIAGNOSIS — M9902 Segmental and somatic dysfunction of thoracic region: Secondary | ICD-10-CM | POA: Diagnosis not present

## 2022-04-25 DIAGNOSIS — R350 Frequency of micturition: Secondary | ICD-10-CM | POA: Diagnosis not present

## 2022-05-12 DIAGNOSIS — M9901 Segmental and somatic dysfunction of cervical region: Secondary | ICD-10-CM | POA: Diagnosis not present

## 2022-05-12 DIAGNOSIS — M5032 Other cervical disc degeneration, mid-cervical region, unspecified level: Secondary | ICD-10-CM | POA: Diagnosis not present

## 2022-05-12 DIAGNOSIS — M9902 Segmental and somatic dysfunction of thoracic region: Secondary | ICD-10-CM | POA: Diagnosis not present

## 2022-05-16 DIAGNOSIS — M9901 Segmental and somatic dysfunction of cervical region: Secondary | ICD-10-CM | POA: Diagnosis not present

## 2022-05-16 DIAGNOSIS — M5032 Other cervical disc degeneration, mid-cervical region, unspecified level: Secondary | ICD-10-CM | POA: Diagnosis not present

## 2022-05-16 DIAGNOSIS — M9902 Segmental and somatic dysfunction of thoracic region: Secondary | ICD-10-CM | POA: Diagnosis not present

## 2022-05-22 ENCOUNTER — Telehealth: Payer: Self-pay | Admitting: Internal Medicine

## 2022-05-22 DIAGNOSIS — Z0289 Encounter for other administrative examinations: Secondary | ICD-10-CM

## 2022-05-22 NOTE — Telephone Encounter (Signed)
Patient dropped off [employer] parking placard to be filled out

## 2022-05-22 NOTE — Telephone Encounter (Signed)
Mailed completed [employer] parking placard

## 2022-05-23 DIAGNOSIS — R350 Frequency of micturition: Secondary | ICD-10-CM | POA: Diagnosis not present

## 2022-06-14 DIAGNOSIS — N1831 Chronic kidney disease, stage 3a: Secondary | ICD-10-CM | POA: Diagnosis not present

## 2022-06-20 DIAGNOSIS — R350 Frequency of micturition: Secondary | ICD-10-CM | POA: Diagnosis not present

## 2022-06-21 DIAGNOSIS — D631 Anemia in chronic kidney disease: Secondary | ICD-10-CM | POA: Diagnosis not present

## 2022-06-21 DIAGNOSIS — N1831 Chronic kidney disease, stage 3a: Secondary | ICD-10-CM | POA: Diagnosis not present

## 2022-06-21 DIAGNOSIS — I129 Hypertensive chronic kidney disease with stage 1 through stage 4 chronic kidney disease, or unspecified chronic kidney disease: Secondary | ICD-10-CM | POA: Diagnosis not present

## 2022-06-21 DIAGNOSIS — N2581 Secondary hyperparathyroidism of renal origin: Secondary | ICD-10-CM | POA: Diagnosis not present

## 2022-07-06 DIAGNOSIS — M5032 Other cervical disc degeneration, mid-cervical region, unspecified level: Secondary | ICD-10-CM | POA: Diagnosis not present

## 2022-07-06 DIAGNOSIS — M9902 Segmental and somatic dysfunction of thoracic region: Secondary | ICD-10-CM | POA: Diagnosis not present

## 2022-07-06 DIAGNOSIS — M9901 Segmental and somatic dysfunction of cervical region: Secondary | ICD-10-CM | POA: Diagnosis not present

## 2022-07-18 DIAGNOSIS — R350 Frequency of micturition: Secondary | ICD-10-CM | POA: Diagnosis not present

## 2022-08-01 DIAGNOSIS — R350 Frequency of micturition: Secondary | ICD-10-CM | POA: Diagnosis not present

## 2022-08-01 DIAGNOSIS — N3281 Overactive bladder: Secondary | ICD-10-CM | POA: Diagnosis not present

## 2022-08-07 DIAGNOSIS — D485 Neoplasm of uncertain behavior of skin: Secondary | ICD-10-CM | POA: Diagnosis not present

## 2022-08-07 DIAGNOSIS — D2271 Melanocytic nevi of right lower limb, including hip: Secondary | ICD-10-CM | POA: Diagnosis not present

## 2022-08-07 DIAGNOSIS — D225 Melanocytic nevi of trunk: Secondary | ICD-10-CM | POA: Diagnosis not present

## 2022-08-07 DIAGNOSIS — Z1283 Encounter for screening for malignant neoplasm of skin: Secondary | ICD-10-CM | POA: Diagnosis not present

## 2022-08-15 ENCOUNTER — Other Ambulatory Visit: Payer: Self-pay | Admitting: Internal Medicine

## 2022-08-15 DIAGNOSIS — Z1231 Encounter for screening mammogram for malignant neoplasm of breast: Secondary | ICD-10-CM

## 2022-08-29 ENCOUNTER — Emergency Department (HOSPITAL_COMMUNITY): Payer: Medicare Other

## 2022-08-29 ENCOUNTER — Telehealth: Payer: Self-pay

## 2022-08-29 ENCOUNTER — Emergency Department (HOSPITAL_COMMUNITY)
Admission: EM | Admit: 2022-08-29 | Discharge: 2022-08-29 | Disposition: A | Discharge status: Home / Self Care | Payer: Medicare Other | Attending: Emergency Medicine | Admitting: Emergency Medicine

## 2022-08-29 ENCOUNTER — Other Ambulatory Visit: Payer: Self-pay

## 2022-08-29 DIAGNOSIS — K5900 Constipation, unspecified: Secondary | ICD-10-CM | POA: Diagnosis not present

## 2022-08-29 DIAGNOSIS — N200 Calculus of kidney: Secondary | ICD-10-CM | POA: Diagnosis not present

## 2022-08-29 DIAGNOSIS — E876 Hypokalemia: Secondary | ICD-10-CM | POA: Diagnosis not present

## 2022-08-29 DIAGNOSIS — K5730 Diverticulosis of large intestine without perforation or abscess without bleeding: Secondary | ICD-10-CM | POA: Diagnosis not present

## 2022-08-29 DIAGNOSIS — N189 Chronic kidney disease, unspecified: Secondary | ICD-10-CM | POA: Insufficient documentation

## 2022-08-29 DIAGNOSIS — R1031 Right lower quadrant pain: Secondary | ICD-10-CM | POA: Diagnosis not present

## 2022-08-29 DIAGNOSIS — I129 Hypertensive chronic kidney disease with stage 1 through stage 4 chronic kidney disease, or unspecified chronic kidney disease: Secondary | ICD-10-CM | POA: Diagnosis not present

## 2022-08-29 DIAGNOSIS — R109 Unspecified abdominal pain: Secondary | ICD-10-CM

## 2022-08-29 DIAGNOSIS — Z79899 Other long term (current) drug therapy: Secondary | ICD-10-CM | POA: Insufficient documentation

## 2022-08-29 DIAGNOSIS — K449 Diaphragmatic hernia without obstruction or gangrene: Secondary | ICD-10-CM | POA: Diagnosis not present

## 2022-08-29 LAB — URINALYSIS, ROUTINE W REFLEX MICROSCOPIC
Bacteria, UA: NONE SEEN
Bilirubin Urine: NEGATIVE
Glucose, UA: NEGATIVE mg/dL
Hgb urine dipstick: NEGATIVE
Ketones, ur: NEGATIVE mg/dL
Nitrite: NEGATIVE
Protein, ur: NEGATIVE mg/dL
Specific Gravity, Urine: 1.011 (ref 1.005–1.030)
pH: 6 (ref 5.0–8.0)

## 2022-08-29 LAB — LIPASE, BLOOD: Lipase: 50 U/L (ref 11–51)

## 2022-08-29 LAB — COMPREHENSIVE METABOLIC PANEL
ALT: 14 U/L (ref 0–44)
AST: 22 U/L (ref 15–41)
Albumin: 4.1 g/dL (ref 3.5–5.0)
Alkaline Phosphatase: 92 U/L (ref 38–126)
Anion gap: 13 (ref 5–15)
BUN: 36 mg/dL — ABNORMAL HIGH (ref 8–23)
CO2: 27 mmol/L (ref 22–32)
Calcium: 9.8 mg/dL (ref 8.9–10.3)
Chloride: 99 mmol/L (ref 98–111)
Creatinine, Ser: 1.78 mg/dL — ABNORMAL HIGH (ref 0.44–1.00)
GFR, Estimated: 29 mL/min — ABNORMAL LOW (ref >60)
Glucose, Bld: 105 mg/dL — ABNORMAL HIGH (ref 70–99)
Potassium: 2.8 mmol/L — ABNORMAL LOW (ref 3.5–5.1)
Sodium: 139 mmol/L (ref 135–145)
Total Bilirubin: 1.2 mg/dL (ref 0.3–1.2)
Total Protein: 7.3 g/dL (ref 6.5–8.1)

## 2022-08-29 LAB — CBC
HCT: 41.3 % (ref 36.0–46.0)
Hemoglobin: 13.6 g/dL (ref 12.0–15.0)
MCH: 29.4 pg (ref 26.0–34.0)
MCHC: 32.9 g/dL (ref 30.0–36.0)
MCV: 89.2 fL (ref 80.0–100.0)
Platelets: 214 10*3/uL (ref 150–400)
RBC: 4.63 MIL/uL (ref 3.87–5.11)
RDW: 13.6 % (ref 11.5–15.5)
WBC: 4 10*3/uL (ref 4.0–10.5)
nRBC: 0 % (ref 0.0–0.2)

## 2022-08-29 LAB — MAGNESIUM: Magnesium: 2.6 mg/dL — ABNORMAL HIGH (ref 1.7–2.4)

## 2022-08-29 MED ORDER — SODIUM CHLORIDE 0.9 % IV BOLUS
1000.0000 mL | Freq: Once | INTRAVENOUS | Status: AC
  Administered 2022-08-29: 1000 mL via INTRAVENOUS

## 2022-08-29 MED ORDER — POTASSIUM CHLORIDE ER 10 MEQ PO TBCR: 10.0000 meq | EXTENDED_RELEASE_TABLET | Freq: Every day | ORAL | 0 refills | Status: DC

## 2022-08-29 MED ORDER — LISINOPRIL 10 MG PO TABS: 10.0000 mg | ORAL_TABLET | Freq: Every day | ORAL | 0 refills | Status: DC

## 2022-08-29 MED ORDER — POTASSIUM CHLORIDE CRYS ER 20 MEQ PO TBCR
20.0000 meq | EXTENDED_RELEASE_TABLET | Freq: Once | ORAL | Status: AC
  Administered 2022-08-29: 20 meq via ORAL
  Filled 2022-08-29: qty 1

## 2022-08-29 MED ORDER — POTASSIUM CHLORIDE 10 MEQ/100ML IV SOLN
10.0000 meq | INTRAVENOUS | Status: AC
  Administered 2022-08-29: 10 meq via INTRAVENOUS
  Filled 2022-08-29 (×2): qty 100

## 2022-08-29 NOTE — Discharge Instructions (Addendum)
We discussed incidental findings on your workup today.  Your potassium level was low and gave you potassium.  This may be a result of the medications you are taking at home.  If you are taking blood pressure medicine that includes hydrochlorothiazide, I recommend that you stop taking this medication at this time.  Instead I prescribed you lisinopril, as an alternative medicine, until you are able to follow-up with your kidney doctor.  Your CT scan also showed that you were constipated, we discussed a bowel regimen at home.  You prefer to use over-the-counter laxatives which is reasonable.  Continue drinking plenty of water.  You have an incidental 3 mm kidney stone that was found inside your right kidney.  You are not actively passing the stone, but you may pass this in the future.

## 2022-08-29 NOTE — ED Provider Triage Note (Signed)
Emergency Medicine Provider Triage Evaluation Note  BANESA TRISTAN , a 77 y.o. female  was evaluated in triage.  Pt complains of RLQ pain.  Review of Systems  Positive:  Negative:   Physical Exam  BP (!) 153/91 (BP Location: Right Arm)   Pulse 70   Temp 98.2 F (36.8 C) (Oral)   Resp 18   Ht 5' (1.524 m)   Wt 86 kg   SpO2 98%   BMI 37.03 kg/m  Gen:   Awake, no distress   Resp:  Normal effort  MSK:   Moves extremities without difficulty  Other:    Medical Decision Making  Medically screening exam initiated at 3:13 PM.  Appropriate orders placed.  CIELLE AGUILA was informed that the remainder of the evaluation will be completed by another provider, this initial triage assessment does not replace that evaluation, and the importance of remaining in the ED until their evaluation is complete.  S/p cholecystectomy 50 years ago. RLQ cramping pain started 5 days ago and resolved. Pain returned 3 days ago. Denies fever, chest pain, dyspnea, n/v/d. Denies dysuria, hematuria, hematochezia. Last BM today.  Ordering labs and CT.   Dorthy Cooler, [place] 08/29/22 1517

## 2022-08-29 NOTE — ED Notes (Signed)
Lab called to add on Magnesium level

## 2022-08-29 NOTE — Telephone Encounter (Signed)
Patient states that she has lower right quadrant pain that comes and goes. Started last Thursday. Tylenol helps, unsure what makes it worse, no fever, no N  & V. She asked to have labs drawn at [facility] but was told she probably needs seen first. Please advise.

## 2022-08-29 NOTE — Telephone Encounter (Signed)
Results have been relayed to the patient/authorized caretaker. The patient/authorized caretaker verbalized understanding. No questions at this time.  She agreed.

## 2022-08-29 NOTE — ED Triage Notes (Signed)
Pt c/o right lower abd pain x 2 days, denies n/v/d

## 2022-08-30 NOTE — ED Provider Notes (Signed)
EMERGENCY DEPARTMENT AT [facility] Provider Note   CSN: [id number] Arrival date & time: 08/29/22  1453     History  Chief Complaint  Patient presents with   Abdominal Pain    Kelly [name] is a 77 y.o. female w/ HTN, CKD, presenting to ED with right lower abdominal pain.  Reports onset 2-3 days ago.  Intermittent pain in same spot, not worse with exercise or movement, does not radiate.  Has mild-moderate pain currently. Reports constipation for a few days, no vomiting.  No dysuria or hematuria.  HPI     Home Medications Prior to Admission medications   Medication Sig Start Date End Date Taking? Authorizing Provider  lisinopril (ZESTRIL) 10 MG tablet Take 1 tablet (10 mg total) by mouth daily for 30 doses. 08/29/22 09/28/22 Yes Derrall Hicks, Kermit Balo, MD  potassium chloride (KLOR-CON) 10 MEQ tablet Take 1 tablet (10 mEq total) by mouth daily for 10 doses. 08/30/22 09/09/22 Yes Yandel Zeiner, Kermit Balo, MD  acetaminophen (TYLENOL) 500 MG tablet Take 1,000 mg by mouth every 6 (six) hours as needed (pain.).    [provider]  amLODipine (NORVASC) 5 MG tablet TAKE 1 TABLET BY MOUTH EVERY DAY 03/06/22   Margaree Mackintosh, MD  Cyanocobalamin (VITAMIN B-12 PO) Take 2,500 mcg by mouth daily.    [provider]  Ferrous Sulfate (IRON PO) Take 1 tablet by mouth in the morning.    [provider]  HYDROcodone-acetaminophen (NORCO) 7.5-325 MG tablet Take 1-2 tablets by mouth every 6 (six) hours as needed for moderate pain. 02/09/22   Kathryne Hitch, MD  methocarbamol (ROBAXIN) 500 MG tablet Take 1 tablet (500 mg total) by mouth every 6 (six) hours as needed for muscle spasms. 01/11/22   Kathryne Hitch, MD  Omega-3 Fatty Acids (OMEGA 3 PO) Take 1 capsule by mouth in the morning. Omega XL    [provider]  rosuvastatin (CRESTOR) 10 MG tablet TAKE 1 TABLET(10 MG) BY MOUTH DAILY 03/06/22   Margaree Mackintosh, MD  TURMERIC PO Take 1,000 mg by  mouth in the morning.    [provider]      Allergies    Patient has no known allergies.    Review of Systems   Review of Systems  Physical Exam Updated Vital Signs BP 132/84   Pulse 62   Temp 98 F (36.7 C) (Oral)   Resp 17   Ht 5' (1.524 m)   Wt 86 kg   SpO2 98%   BMI 37.03 kg/m  Physical Exam Vitals and nursing note reviewed.  Constitutional:      General: She is not in acute distress.    Appearance: She is well-developed.  HENT:     Head: Normocephalic and atraumatic.  Eyes:     Conjunctiva/sclera: Conjunctivae normal.  Cardiovascular:     Rate and Rhythm: Normal rate and regular rhythm.     Heart sounds: No murmur heard. Pulmonary:     Effort: Pulmonary effort is normal. No respiratory distress.     Breath sounds: Normal breath sounds.  Abdominal:     Palpations: Abdomen is soft.     Tenderness: There is no abdominal tenderness. There is no guarding. Negative signs include Murphy's sign.  Musculoskeletal:        General: No swelling.     Cervical back: Neck supple.  Skin:    General: Skin is warm and dry.     Capillary Refill: Capillary refill  takes less than 2 seconds.  Neurological:     Mental Status: She is alert.  Psychiatric:        Mood and Affect: Mood normal.     ED Results / Procedures / Treatments   Labs (all labs ordered are listed, but only abnormal results are displayed) Labs Reviewed  COMPREHENSIVE METABOLIC PANEL - Abnormal; Notable for the following components:      Result Value   Potassium 2.8 (*)    Glucose, Bld 105 (*)    BUN 36 (*)    Creatinine, Ser 1.78 (*)    GFR, Estimated 29 (*)    All other components within normal limits  URINALYSIS, ROUTINE W REFLEX MICROSCOPIC - Abnormal; Notable for the following components:   Leukocytes,Ua SMALL (*)    All other components within normal limits  MAGNESIUM - Abnormal; Notable for the following components:   Magnesium 2.6 (*)    All other components within normal limits   LIPASE, BLOOD  CBC    EKG None  Radiology CT ABDOMEN PELVIS WO CONTRAST  Result Date: 08/29/2022 CLINICAL DATA:  Right lower quadrant pain over the last 2 days. EXAM: CT ABDOMEN AND PELVIS WITHOUT CONTRAST TECHNIQUE: Multidetector CT imaging of the abdomen and pelvis was performed following the standard protocol without IV contrast. RADIATION DOSE REDUCTION: This exam was performed according to the departmental dose-optimization program which includes automated exposure control, adjustment of the mA and/or kV according to patient size and/or use of iterative reconstruction technique. COMPARISON:  None Available. FINDINGS: Lower chest: Moderate size hiatal hernia. Mild scarring at the lung bases. No active process otherwise Hepatobiliary: Liver parenchyma is normal without contrast. I think there is been previous cholecystectomy. 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 This is probably not of acute relevance. Pancreas: Normal Spleen: Normal Adrenals/Urinary Tract: Adrenal glands are normal. Mild atrophic change of the kidneys. No evidence of mass or hydronephrosis. Nonobstructing 3 mm stone in the midportion of the right kidney. Bladder is normal. Stomach/Bowel: Moderate hiatal hernia as noted above. Small bowel is normal. No sign of appendicitis. Moderate amount of fecal matter throughout the colon. Diverticulosis of the left colon without evidence of diverticulitis. No inflammatory change seen in the right lower quadrant. Vascular/Lymphatic: Aortic atherosclerosis. No aneurysm. IVC is normal. No adenopathy. Reproductive: Previous hysterectomy.  No pelvic mass. Other: No free fluid or air.  No hernia. Musculoskeletal: Chronic degenerative changes affect the lumbar spine. IMPRESSION: 1. No acute finding by CT. No sign of appendicitis. No inflammatory change in the right lower quadrant. Moderate amount of fecal matter throughout the colon. 2. Moderate hiatal hernia. 3. Previous cholecystectomy. 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 This is probably not of acute relevance. 4. Nonobstructing 3 mm stone in the midportion of the right kidney. Aortic Atherosclerosis (ICD10-I70.0). Electronically Signed   By: Paulina Fusi M.D.   On: 08/29/2022 18:47    Procedures Procedures    Medications Ordered in ED Medications  potassium chloride 10 mEq in 100 mL IVPB (0 mEq Intravenous Stopped 08/29/22 1956)  sodium chloride 0.9 % bolus 1,000 mL (0 mLs Intravenous Stopped 08/29/22 1956)  potassium chloride SA (KLOR-CON M) CR tablet 20 mEq (20 mEq Oral Given 08/29/22 1939)    ED Course/ Medical Decision Making/ A&P Clinical Course as of 08/30/22 1433  Tue Aug 29, 2022  1930 Patient was reassessed and remains minimally symptomatic.  We discussed her incidental findings on her workup.  She is aware of her kidney disease and already sees a  nephrologist for this.  She tells me that she was on a combination blood pressure medicine and diuretic as she cannot recall the name of, and I suspect this may be lisinopril-HCTZ, and possibly culprit for hypokalemia.  She otherwise eats bananas every day and has never had hypokalemia issues in the past.  I will switch her back onto the simple lisinopril close she can follow-up with her nephrologist again.  We also discussed potassium supplementation.  We discussed her constipation and she has felt constipated, this may be contributing to her abdominal pain, she prefers to use over-the-counter laxatives which she has at home.  I was able to answer all of her questions and I do think she is stable for discharge at this time [MT]    Clinical Course User Index [MT] Christena Sunderlin, Kermit Balo, MD                             Medical Decision Making Amount and/or Complexity of Data Reviewed Labs: ordered.  Risk Prescription drug management.   This patient presents to the ED with concern for abdominal pain, constipation. This involves an extensive number of treatment options, and is a complaint that  carries with it a high risk of complications and morbidity.  The differential diagnosis includes constipation vs ileus vs appendicitis vs UTI vs ureteral colic vs other  I ordered and personally interpreted labs.  The pertinent results include:  K 2.8, Cr 1.7, GFR 29 (known CKD)  I ordered imaging studies including ct abdomen pelvis I independently visualized and interpreted imaging which showed no emergent findings; large stool burden; incidental 3 mm renal stone; hiatal hernia and s/p cholecystectomy I agree with the radiologist interpretation   I ordered medication including IV K (1 run completed), IV fluids, oral K for hypokalemia and hydration.  Patient not requesting pain medication, says her pain is very minimal in the ED.  I have reviewed the patients home medicines and have made adjustments as needed  Test Considered: doubt ovarian torsion; AAA; DVT  After the interventions noted above, I reevaluated the patient and found that they have: improved  - eating comfortably, tolerating PO   Dispostion:  After consideration of the diagnostic results and the patients response to treatment, I feel that the patent would benefit from outpatient follow up for her abdominal pain and hypoK, as well as bowel clean-out at home        Final Clinical Impression(s) / ED Diagnoses Final diagnoses:  Hypokalemia  Constipation, unspecified constipation type  Abdominal pain, unspecified abdominal location    Rx / DC Orders ED Discharge Orders          Ordered    potassium chloride (KLOR-CON) 10 MEQ tablet  Daily        08/29/22 1928    lisinopril (ZESTRIL) 10 MG tablet  Daily        08/29/22 1928              Terald Sleeper, MD 08/30/22 1437

## 2022-09-02 ENCOUNTER — Other Ambulatory Visit: Payer: Self-pay | Admitting: Internal Medicine

## 2022-09-07 DIAGNOSIS — N3281 Overactive bladder: Secondary | ICD-10-CM | POA: Diagnosis not present

## 2022-09-07 DIAGNOSIS — R350 Frequency of micturition: Secondary | ICD-10-CM | POA: Diagnosis not present

## 2022-09-14 DIAGNOSIS — M9901 Segmental and somatic dysfunction of cervical region: Secondary | ICD-10-CM | POA: Diagnosis not present

## 2022-09-14 DIAGNOSIS — M9902 Segmental and somatic dysfunction of thoracic region: Secondary | ICD-10-CM | POA: Diagnosis not present

## 2022-09-14 DIAGNOSIS — M5032 Other cervical disc degeneration, mid-cervical region, unspecified level: Secondary | ICD-10-CM | POA: Diagnosis not present

## 2022-09-27 ENCOUNTER — Ambulatory Visit: Payer: Medicare Other | Admitting: Orthopaedic Surgery

## 2022-09-28 ENCOUNTER — Ambulatory Visit: Payer: Medicare Other

## 2022-10-05 ENCOUNTER — Ambulatory Visit
Admission: RE | Admit: 2022-10-05 | Discharge: 2022-10-05 | Disposition: A | Discharge status: Home / Self Care | Payer: Medicare Other | Source: Ambulatory Visit | Attending: Internal Medicine | Admitting: Internal Medicine

## 2022-10-05 ENCOUNTER — Ambulatory Visit (INDEPENDENT_AMBULATORY_CARE_PROVIDER_SITE_OTHER): Payer: Medicare Other | Admitting: Orthopaedic Surgery

## 2022-10-05 DIAGNOSIS — Z1231 Encounter for screening mammogram for malignant neoplasm of breast: Secondary | ICD-10-CM | POA: Diagnosis not present

## 2022-10-05 DIAGNOSIS — Z96651 Presence of right artificial knee joint: Secondary | ICD-10-CM

## 2022-10-05 NOTE — Progress Notes (Signed)
The patient is now 10 months status post a right total knee arthroplasty.  She has had a revision arthroplasty years ago of her left knee.  She is 77 years old.  She was be 77 in November.  She recently a week ago did injure her right hip with an accident with a lawnmower.  She says it is feeling better now but still has just a little bit of pain.  Both her knees are examined.  Both knees have full extension but only flex to about 90 to 95 degrees.  She is satisfied with that.  There is no pain with motion of either knee.  We did not x-ray them today.  Her right hip actually moves fluidly and smoothly with no blocks to rotation.  I told her to give it a few weeks with her right hip and if this continues to give her problems not to hesitate to come back and see [place] for x-rays.  Right now she is walking without any assistive device and is a caregiver.  She does perform water aerobics as well.  All question and concerns were answered and addressed.  For now follow-up is as needed.

## 2022-11-01 DIAGNOSIS — H35361 Drusen (degenerative) of macula, right eye: Secondary | ICD-10-CM | POA: Diagnosis not present

## 2022-11-01 DIAGNOSIS — Z961 Presence of intraocular lens: Secondary | ICD-10-CM | POA: Diagnosis not present

## 2022-11-01 DIAGNOSIS — H26491 Other secondary cataract, right eye: Secondary | ICD-10-CM | POA: Diagnosis not present

## 2022-11-01 DIAGNOSIS — H35372 Puckering of macula, left eye: Secondary | ICD-10-CM | POA: Diagnosis not present

## 2022-11-01 LAB — HM DIABETES EYE EXAM

## 2022-11-08 DIAGNOSIS — R350 Frequency of micturition: Secondary | ICD-10-CM | POA: Diagnosis not present

## 2022-11-08 DIAGNOSIS — R351 Nocturia: Secondary | ICD-10-CM | POA: Diagnosis not present

## 2022-11-09 DIAGNOSIS — M9902 Segmental and somatic dysfunction of thoracic region: Secondary | ICD-10-CM | POA: Diagnosis not present

## 2022-11-09 DIAGNOSIS — M9901 Segmental and somatic dysfunction of cervical region: Secondary | ICD-10-CM | POA: Diagnosis not present

## 2022-11-09 DIAGNOSIS — M5032 Other cervical disc degeneration, mid-cervical region, unspecified level: Secondary | ICD-10-CM | POA: Diagnosis not present

## 2022-12-07 NOTE — Progress Notes (Signed)
Annual Wellness Visit    Patient Care Team: Margaree Mackintosh, MD as PCP - General  Visit Date: 12/14/22   Chief Complaint  Patient presents with   Medicare Wellness    Subjective:   Patient: Kelly [name], Female    DOB: 09/24/1945, 77 y.o.   MRN: [id number]  Kelly [name] is a 77 y.o. Female who presents today for her Annual Wellness Visit. History of arthritis, cataract, chronic kidney disease, depression, hyperlipidemia, hypertension, osteoporosis.  Reports she has had shortness of breath on exertion since revision right total knee arthroplasty in 01/2022. Denies chest pain. She is doing water aerobics and gardening. Eats oatmeal, coffee, banana for breakfast. 11/28/21 EKG showed normal sinus rhythm. Denies feet swelling. Seems to occur with heavy lifting or exertion.   She is sleeping well but has some nocturia.  History of hyperlipidemia treated with rosuvastatin 10 mg daily. HDL low at 37, TRIG elevated at 162, LDL elevated at 115.  History of hypertension treated with lisinopril 10 mg daily, amlodipine 5 mg daily. Blood pressure normal in-office today at 120/80.  History of impaired glucose tolerance. HGBA1c at 5.5% on 12/08/22, down from 5.8% on 11/25/21.  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   Seen in ER 08/29/22 for hypokalemia, constipation. Given IV potassium chloride, IV fluids, oral potassium chloride. CT abdomen/pelvis showed: 1) No acute finding by CT. No sign of appendicitis. No inflammatory change in the right lower quadrant. Moderate amount of fecal matter throughout the colon. 2. Moderate hiatal hernia, 3) Previous cholecystectomy. 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 This is probably not of acute relevance, 4) Nonobstructing 3 mm stone in the midportion of the right kidney. Aortic Atherosclerosis. Switched back to simple lisinopril from combination form.  Eye exam by [facility] Sept 2024  Saw Dr. Alvester Morin  in August 2022 for lumbar radiculopathy and had epidural steroid injection.  History of right foraminal narrowing L5-S1 and multilevel facet hypertrophy based on MRI in 2021.  Seen in [facility] for urinary frequency.  History of overactive bladder.  Did not tolerate Ditropan because of side effects including dry mouth.  Main complaint is nocturia.  Has failed Myrbetriq. PTNS recommended by urologist.  Elevated serum creatinine likely due to years of hypertension. CKD stage 3a seen by [facility].  Patient had multiple plastic surgery procedures in 2002 and 2003 including abdominoplasty, breast reduction surgery, bilateral brachioplasty.  History of facial cosmetic surgery with laser peel in 2018.  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 Merlyn Lot in 2011.  History of right carpal tunnel syndrome.  Labs reviewed today. Creatinine elevated at 1.4, GFR low at 39. Calcium elevated at 10.5. Glucose normal. Electrolytes normal. Blood proteins normal. WBC low at 3,500. Absolute lymphocytes low at 795. TSH at 2.42.  Mammogram normal on 10/06/22.  History of positive Cologuard test and had colonoscopy by Dr. Marina Goodell in January 2022 with several adenomatous polyps being removed. Repeat study recommended in 3 years.   Social history: Patient was raised in an orphanage in [place]. Patient's daughter died of metastatic ovarian cancer. Patient previously worked for the [employer] and is currently working as a Comptroller. She is divorced. Drinks 2 glasses of wine daily. Does not smoke.  Family history: Father died of alcoholism. Mother died of heart disease. 1 sister with history of lung cancer. 3 sisters in good health.   Past Medical History:  Diagnosis Date   Arthritis  Cataract    Chronic kidney disease    Depression    Hyperlipidemia    Hypertension    Osteoporosis      Family History  Problem Relation Age of Onset   Heart disease Mother     Alcohol abuse Father    Hypertension Father    Cancer Sister    Hypertension Sister    Breast cancer Neg Hx    Colon cancer Neg Hx    Colon polyps Neg Hx    Esophageal cancer Neg Hx    Stomach cancer Neg Hx    Rectal cancer Neg Hx      Social Hx: resides alone. Divorced. Drinks 2 glasses of wine daily. Does not smoke.Has a couple of elderly folks she helps by staying with them as needed.  FamilyHx: Mother died of heart disease.  Father died of alcoholism.  1 sister with history of lung cancer.  3 sisters in good health.    Review of Systems  Constitutional:  Negative for chills, fever, malaise/fatigue and weight loss.  HENT:  Negative for hearing loss, sinus pain and sore throat.   Respiratory:  Positive for shortness of breath. Negative for cough and hemoptysis.   Cardiovascular:  Negative for chest pain, palpitations, leg swelling and PND.  Gastrointestinal:  Negative for abdominal pain, constipation, diarrhea, heartburn, nausea and vomiting.  Genitourinary:  Negative for dysuria, frequency and urgency.  Musculoskeletal:  Negative for back pain, myalgias and neck pain.  Skin:  Negative for itching and rash.  Neurological:  Negative for dizziness, tingling, seizures and headaches.  Endo/Heme/Allergies:  Negative for polydipsia.  Psychiatric/Behavioral:  Negative for depression. The patient is not nervous/anxious.       Objective:   Vitals: BP 120/80   Pulse 76   Ht 4' 11.5" (1.511 m)   Wt 195 lb (88.5 kg)   SpO2 96%   BMI 38.73 kg/m   Pleasant female in no acute distress.  Skin: Warm and dry.  No cervical adenopathy.  No thyromegaly.  No carotid bruits.  Chest clear.  Cardiac exam: Regular rate and rhythm.  2/6 systolic ejection murmur which has not been previously appreciated.  Breasts are without masses.  Abdomen is soft nondistended without hepatosplenomegaly masses or tenderness.  Pelvic exam deferred.  She has trace lower extremity pitting edema.  Neurological exam is  intact without gross focal deficits.  Affect thought and judgment are normal.   Most recent functional status assessment:    12/14/2022    3:02 PM  In your present state of health, do you have any difficulty performing the following activities:  Hearing? 0  Vision? 0  Difficulty concentrating or making decisions? 0  Walking or climbing stairs? 1  Dressing or bathing? 0  Doing errands, shopping? 0  Preparing Food and eating ? N  Using the Toilet? N  In the past six months, have you accidently leaked urine? Y  Do you have problems with loss of bowel control? N  Managing your Medications? N  Managing your Finances? N  Housekeeping or managing your Housekeeping? N   Most recent fall risk assessment:    12/14/2022    3:15 PM  Fall Risk   Falls in the past year? 0  Number falls in past yr: 0  Injury with Fall? 0  Risk for fall due to : No Fall Risks  Follow up Falls evaluation completed    Most recent depression screenings:    12/14/2022    3:15 PM 12/05/2021  2:18 PM  PHQ 2/9 Scores  PHQ - 2 Score 0 0   Most recent cognitive screening:    12/14/2022    3:12 PM  6CIT Screen  What Year? 0 points  What month? 0 points  What time? 0 points  Count back from 20 0 points  Months in reverse 0 points  Repeat phrase 0 points  Total Score 0 points     Results:   Studies obtained and personally reviewed by me:  Mammogram normal on 10/06/22.  History of positive Cologuard test and had colonoscopy by Dr. Marina Goodell in January 2022 with several adenomatous polyps being removed. Repeat study recommended in 3 years.   08/29/22 CT abdomen/pelvis showed: 1) No acute finding by CT. No sign of appendicitis. No inflammatory change in the right lower quadrant. Moderate amount of fecal matter throughout the colon. 2. Moderate hiatal hernia, 3) Previous cholecystectomy. 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 This is probably not of acute relevance, 4) Nonobstructing 3 mm stone in the midportion of  the right kidney. Aortic Atherosclerosis.  Labs:       Component Value Date/Time   NA 141 12/08/2022 0932   K 4.8 12/08/2022 0932   CL 104 12/08/2022 0932   CO2 30 12/08/2022 0932   GLUCOSE 88 12/08/2022 0932   BUN 22 12/08/2022 0932   CREATININE 1.40 (H) 12/08/2022 0932   CALCIUM 10.5 (H) 12/08/2022 0932   PROT 7.0 12/08/2022 0932   ALBUMIN 4.1 08/29/2022 1510   AST 22 12/08/2022 0932   ALT 14 12/08/2022 0932   ALKPHOS 92 08/29/2022 1510   BILITOT 0.7 12/08/2022 0932   GFRNONAA 29 (L) 08/29/2022 1510   GFRNONAA 34 (L) 11/18/2019 0954   GFRAA 39 (L) 11/18/2019 0954     Lab Results  Component Value Date   WBC 3.5 (L) 12/08/2022   HGB 13.6 12/08/2022   HCT 41.5 12/08/2022   MCV 92.8 12/08/2022   PLT 199 12/08/2022    Lab Results  Component Value Date   CHOL 180 12/08/2022   HDL 37 (L) 12/08/2022   LDLCALC 115 (H) 12/08/2022   TRIG 162 (H) 12/08/2022   CHOLHDL 4.9 12/08/2022    Lab Results  Component Value Date   HGBA1C 5.5 12/08/2022     Lab Results  Component Value Date   TSH 2.42 12/08/2022    Assessment & Plan:   Dyspnea on exertion: Patient  this has been an issue since revision right total knee arthroplasty in 01/2022. Ordered chest X-ray. EKG was normal today. Ordered echocardiogram and will refer to [facility] cardiology.  Nocturia: she wakes up during the night to void her bladder but is sleeping well otherwise.  Hyperlipidemia: treated with rosuvastatin 10 mg daily. HDL low at 37, TRIG elevated at 162, LDL elevated at 115.  Hypertension: treated with lisinopril 10 mg daily, amlodipine 5 mg daily. Blood pressure normal in-office today at 120/80.  Impaired glucose tolerance: HGBA1c at 5.5% on 12/08/22, down from 5.8% on 11/25/21.  Seen by [facility] for Stage 3a chronic kidney disease.  History of right foraminal narrowing L5-S1 and multilevel facet hypertrophy based on MRI in 2021.  Seen in [facility] for urinary frequency, overactive  bladder.    Elevated serum creatinine likely due to years of hypertension.  Trace dependednt edema today.  History of right carpal tunnel syndrome.  Urinalysis today showed cloudy urine with small leukocytes.  Pelvic exam deferred.  Mammogram normal on 10/06/22.  History of positive Cologuard test and had colonoscopy by  Dr. Marina Goodell in January 2022 with several adenomatous polyps being removed. Repeat study recommended in 3 years.   Vaccine counseling: UTD on tetanus vaccine. Suggest flu, Covid-19, shingles vaccines.  Return in 6 months or as needed.  Will place referral to [facility] to evaluate systolic murmur and shortness of breath with exertion     Annual wellness visit done today including the all of the following: Reviewed patient's Family Medical History Reviewed and updated list of patient's medical providers Assessment of cognitive impairment was done Assessed patient's functional ability Established a written schedule for health screening services Health Risk Assessent Completed and Reviewed  Discussed health benefits of physical activity, and encouraged her to engage in regular exercise appropriate for her age and condition.        I,Alexander Ruley,acting as a Neurosurgeon for Margaree Mackintosh, MD.,have documented all relevant documentation on the behalf of Margaree Mackintosh, MD,as directed by  Margaree Mackintosh, MD while in the presence of Margaree Mackintosh, MD.   I, Margaree Mackintosh, MD, have reviewed all documentation for this visit. The documentation on 12/14/22 for the exam, diagnosis, procedures, and orders are all accurate and complete.

## 2022-12-08 ENCOUNTER — Other Ambulatory Visit: Payer: Medicare Other

## 2022-12-08 DIAGNOSIS — Z0000 Encounter for general adult medical examination without abnormal findings: Secondary | ICD-10-CM

## 2022-12-08 DIAGNOSIS — N1831 Chronic kidney disease, stage 3a: Secondary | ICD-10-CM | POA: Diagnosis not present

## 2022-12-08 DIAGNOSIS — I10 Essential (primary) hypertension: Secondary | ICD-10-CM | POA: Diagnosis not present

## 2022-12-08 DIAGNOSIS — E781 Pure hyperglyceridemia: Secondary | ICD-10-CM

## 2022-12-08 DIAGNOSIS — E119 Type 2 diabetes mellitus without complications: Secondary | ICD-10-CM | POA: Diagnosis not present

## 2022-12-08 DIAGNOSIS — E7439 Other disorders of intestinal carbohydrate absorption: Secondary | ICD-10-CM

## 2022-12-08 DIAGNOSIS — Z1329 Encounter for screening for other suspected endocrine disorder: Secondary | ICD-10-CM

## 2022-12-09 LAB — COMPLETE METABOLIC PANEL WITH GFR
AG Ratio: 1.4 (calc) (ref 1.0–2.5)
ALT: 14 U/L (ref 6–29)
AST: 22 U/L (ref 10–35)
Albumin: 4.1 g/dL (ref 3.6–5.1)
Alkaline phosphatase (APISO): 100 U/L (ref 37–153)
BUN/Creatinine Ratio: 16 (calc) (ref 6–22)
BUN: 22 mg/dL (ref 7–25)
CO2: 30 mmol/L (ref 20–32)
Calcium: 10.5 mg/dL — ABNORMAL HIGH (ref 8.6–10.4)
Chloride: 104 mmol/L (ref 98–110)
Creat: 1.4 mg/dL — ABNORMAL HIGH (ref 0.60–1.00)
Globulin: 2.9 g/dL (ref 1.9–3.7)
Glucose, Bld: 88 mg/dL (ref 65–99)
Potassium: 4.8 mmol/L (ref 3.5–5.3)
Sodium: 141 mmol/L (ref 135–146)
Total Bilirubin: 0.7 mg/dL (ref 0.2–1.2)
Total Protein: 7 g/dL (ref 6.1–8.1)
eGFR: 39 mL/min/{1.73_m2} — ABNORMAL LOW (ref >=60)

## 2022-12-09 LAB — CBC WITH DIFFERENTIAL/PLATELET
Absolute Lymphocytes: 795 {cells}/uL — ABNORMAL LOW (ref 850–3900)
Absolute Monocytes: 249 {cells}/uL (ref 200–950)
Basophils Absolute: 11 {cells}/uL (ref 0–200)
Basophils Relative: 0.3 %
Eosinophils Absolute: 39 {cells}/uL (ref 15–500)
Eosinophils Relative: 1.1 %
HCT: 41.5 % (ref 35.0–45.0)
Hemoglobin: 13.6 g/dL (ref 11.7–15.5)
MCH: 30.4 pg (ref 27.0–33.0)
MCHC: 32.8 g/dL (ref 32.0–36.0)
MCV: 92.8 fL (ref 80.0–100.0)
MPV: 11 fL (ref 7.5–12.5)
Monocytes Relative: 7.1 %
Neutro Abs: 2408 {cells}/uL (ref 1500–7800)
Neutrophils Relative %: 68.8 %
Platelets: 199 10*3/uL (ref 140–400)
RBC: 4.47 10*6/uL (ref 3.80–5.10)
RDW: 12.7 % (ref 11.0–15.0)
Total Lymphocyte: 22.7 %
WBC: 3.5 10*3/uL — ABNORMAL LOW (ref 3.8–10.8)

## 2022-12-09 LAB — LIPID PANEL
Cholesterol: 180 mg/dL (ref <200)
HDL: 37 mg/dL — ABNORMAL LOW (ref >=50)
LDL Cholesterol (Calc): 115 mg/dL — ABNORMAL HIGH
Non-HDL Cholesterol (Calc): 143 mg/dL — ABNORMAL HIGH (ref <130)
Total CHOL/HDL Ratio: 4.9 (calc) (ref <5.0)
Triglycerides: 162 mg/dL — ABNORMAL HIGH (ref <150)

## 2022-12-09 LAB — HEMOGLOBIN A1C
Hgb A1c MFr Bld: 5.5 %{Hb} (ref <5.7)
Mean Plasma Glucose: 111 mg/dL
eAG (mmol/L): 6.2 mmol/L

## 2022-12-09 LAB — TSH: TSH: 2.42 m[IU]/L (ref 0.40–4.50)

## 2022-12-09 LAB — MICROALBUMIN / CREATININE URINE RATIO
Creatinine, Urine: 80 mg/dL (ref 20–275)
Microalb Creat Ratio: 33 mg/g{creat} — ABNORMAL HIGH (ref <30)
Microalb, Ur: 2.6 mg/dL

## 2022-12-14 ENCOUNTER — Ambulatory Visit: Payer: Medicare Other | Admitting: Internal Medicine

## 2022-12-14 ENCOUNTER — Ambulatory Visit
Admission: RE | Admit: 2022-12-14 | Discharge: 2022-12-14 | Disposition: A | Discharge status: Home / Self Care | Payer: Medicare Other | Source: Ambulatory Visit | Attending: Internal Medicine | Admitting: Internal Medicine

## 2022-12-14 ENCOUNTER — Encounter: Payer: Self-pay | Admitting: Internal Medicine

## 2022-12-14 ENCOUNTER — Other Ambulatory Visit: Payer: Self-pay | Admitting: Internal Medicine

## 2022-12-14 VITALS — BP 120/80 | HR 76 | Ht 59.5 in | Wt 195.0 lb

## 2022-12-14 DIAGNOSIS — Z96651 Presence of right artificial knee joint: Secondary | ICD-10-CM

## 2022-12-14 DIAGNOSIS — I10 Essential (primary) hypertension: Secondary | ICD-10-CM

## 2022-12-14 DIAGNOSIS — R351 Nocturia: Secondary | ICD-10-CM

## 2022-12-14 DIAGNOSIS — E7439 Other disorders of intestinal carbohydrate absorption: Secondary | ICD-10-CM | POA: Diagnosis not present

## 2022-12-14 DIAGNOSIS — R609 Edema, unspecified: Secondary | ICD-10-CM

## 2022-12-14 DIAGNOSIS — Z0000 Encounter for general adult medical examination without abnormal findings: Secondary | ICD-10-CM | POA: Diagnosis not present

## 2022-12-14 DIAGNOSIS — N1831 Chronic kidney disease, stage 3a: Secondary | ICD-10-CM | POA: Diagnosis not present

## 2022-12-14 DIAGNOSIS — N3941 Urge incontinence: Secondary | ICD-10-CM | POA: Diagnosis not present

## 2022-12-14 DIAGNOSIS — R011 Cardiac murmur, unspecified: Secondary | ICD-10-CM

## 2022-12-14 DIAGNOSIS — K449 Diaphragmatic hernia without obstruction or gangrene: Secondary | ICD-10-CM | POA: Diagnosis not present

## 2022-12-14 DIAGNOSIS — R0602 Shortness of breath: Secondary | ICD-10-CM

## 2022-12-14 DIAGNOSIS — Z6838 Body mass index (BMI) 38.0-38.9, adult: Secondary | ICD-10-CM

## 2022-12-14 DIAGNOSIS — Z860101 Personal history of adenomatous and serrated colon polyps: Secondary | ICD-10-CM | POA: Diagnosis not present

## 2022-12-14 DIAGNOSIS — R8290 Unspecified abnormal findings in urine: Secondary | ICD-10-CM | POA: Diagnosis not present

## 2022-12-14 LAB — POCT URINALYSIS DIP (CLINITEK)
Bilirubin, UA: NEGATIVE
Blood, UA: NEGATIVE
Glucose, UA: NEGATIVE mg/dL
Ketones, POC UA: NEGATIVE mg/dL
Nitrite, UA: NEGATIVE
Spec Grav, UA: 1.015
Urobilinogen, UA: 0.2 U/dL
pH, UA: 6

## 2022-12-14 NOTE — Patient Instructions (Addendum)
Patient has new onset of shortness of breath with heavy lifting and exertion. Does not seem to have CHF on exam. May have occult coronary disease. Kidney functions are stable. She will have CXR today and will likely be referred to Cardiology for further evaluation. She has a systolic murmur on exam.  Addendum: CXR shows no CHF. Make referral to [facility]

## 2022-12-15 ENCOUNTER — Other Ambulatory Visit: Payer: Self-pay | Admitting: Internal Medicine

## 2022-12-15 DIAGNOSIS — R0602 Shortness of breath: Secondary | ICD-10-CM

## 2022-12-15 LAB — URINE CULTURE
MICRO NUMBER:: 15669982
SPECIMEN QUALITY:: ADEQUATE

## 2022-12-18 ENCOUNTER — Encounter: Payer: Self-pay | Admitting: Internal Medicine

## 2023-01-18 ENCOUNTER — Ambulatory Visit: Payer: Medicare Other | Attending: Cardiovascular Disease | Admitting: Cardiovascular Disease

## 2023-01-18 ENCOUNTER — Encounter: Payer: Self-pay | Admitting: Cardiovascular Disease

## 2023-01-18 ENCOUNTER — Other Ambulatory Visit: Payer: Self-pay | Admitting: Cardiovascular Disease

## 2023-01-18 VITALS — BP 128/80 | HR 73 | Ht 59.5 in | Wt 195.8 lb

## 2023-01-18 DIAGNOSIS — R0602 Shortness of breath: Secondary | ICD-10-CM | POA: Insufficient documentation

## 2023-01-18 DIAGNOSIS — R011 Cardiac murmur, unspecified: Secondary | ICD-10-CM | POA: Diagnosis not present

## 2023-01-18 DIAGNOSIS — R072 Precordial pain: Secondary | ICD-10-CM | POA: Insufficient documentation

## 2023-01-18 MED ORDER — METOPROLOL TARTRATE 50 MG PO TABS: 50.0000 mg | ORAL_TABLET | Freq: Once | ORAL | 0 refills | Status: DC

## 2023-01-18 NOTE — Patient Instructions (Addendum)
Medication Instructions:  No changes *If you need a refill on your cardiac medications before your next appointment, please call your pharmacy*   Lab Work: Today: bmet If you have labs (blood work) drawn today and your tests are completely normal, you will receive your results only by: MyChart Message (if you have MyChart) OR A paper copy in the mail If you have any lab test that is abnormal or we need to change your treatment, we will call you to review the results.   Testing/Procedures: Your physician has requested that you have an echocardiogram. Echocardiography is a painless test that uses sound waves to create images of your heart. It provides your doctor with information about the size and shape of your heart and how well your heart's chambers and valves are working. This procedure takes approximately one hour. There are no restrictions for this procedure. Please do NOT wear cologne, perfume, aftershave, or lotions (deodorant is allowed). Please arrive 15 minutes prior to your appointment time.  Please note: We ask at that you not bring children with you during ultrasound (echo/ vascular) testing. Due to room size and safety concerns, children are not allowed in the ultrasound rooms during exams. Our front office staff cannot provide observation of children in our lobby area while testing is being conducted. An adult accompanying a patient to their appointment will only be allowed in the ultrasound room at the discretion of the ultrasound technician under special circumstances. We apologize for any inconvenience.  Cardiac ct angiogram - see instructions below   Follow-Up: At [facility], you and your health needs are our priority.  As part of our continuing mission to provide you with exceptional heart care, we have created designated Provider Care Teams.  These Care Teams include your primary Cardiologist (physician) and Advanced Practice Providers (APPs -  Physician  Assistants and Nurse Practitioners) who all work together to provide you with the care you need, when you need it.    Your next appointment:   6 week(s)  Provider:   Verne Carrow, MD       Your cardiac CT will be scheduled at:   [facility] [address] [place], [place] [address] [phone]   Please arrive at the [facility] and Children's Entrance (Entrance C2) of [facility] 30 minutes prior to test start time. You can use the FREE valet parking offered at entrance C (encouraged to control the heart rate for the test)  Proceed to the [facility] Radiology Department (first floor) to check-in and test prep.  All radiology patients and guests should use entrance C2 at [facility], accessed from [facility], even though the hospital's physical address listed is [address].     Please follow these instructions carefully (unless otherwise directed):  An IV will be required for this test and Nitroglycerin will be given.    On the Night Before the Test: Be sure to Drink plenty of water. Do not consume any caffeinated/decaffeinated beverages or chocolate 12 hours prior to your test. Do not take any antihistamines 12 hours prior to your test.  On the Day of the Test: Drink plenty of water until 1 hour prior to the test. Do not eat any food 1 hour prior to test. You may take your regular medications prior to the test.  Take metoprolol (Lopressor) two hours prior to test. FEMALES- please wear underwire-free bra if available, avoid dresses & tight clothing  After the Test:  Drink plenty of water. After receiving IV contrast, you may experience a mild flushed feeling. This is normal. On occasion, you may experience a mild rash up to 24 hours after the test. This is not dangerous. If this occurs, you can take Benadryl 25 mg and increase your fluid intake. If you experience trouble breathing, this can be serious. If it is  severe call 911 IMMEDIATELY. If it is mild, please call our office.  We will call to schedule your test 2-4 weeks out understanding that some insurance companies will need an authorization prior to the service being performed.   For more information and frequently asked questions, please visit our website : [email or web address]  For non-scheduling related questions, please contact the cardiac imaging nurse navigator should you have any questions/concerns: Cardiac Imaging Nurse Navigators Direct Office Dial: [phone]   For scheduling needs, including cancellations and rescheduling, please call [place], [phone].

## 2023-01-18 NOTE — Progress Notes (Signed)
Chief Complaint  Patient presents with   New Patient (Initial Visit)    Dyspnea on exertion   History of Present Illness: 77 yo female with history of arthritis, CKD, HLD and HTN who is here today as a new consult, referred by Dr. Lenord Fellers, for the evaluation of dyspnea on exertion. Chest x-ray October 2024 with no active lung disease. EKG with sinus rhythm and no ischemic changes in October 2024. She tells me that she has had dyspnea since she had her knee replacement in December 2023. She states that she has dyspnea with minimal exertion. Occasional chest pressure. No dizziness, near syncope or syncope. No LE edema or palpitations.   Primary Care Physician: Margaree Mackintosh, MD   Past Medical History:  Diagnosis Date   Arthritis    Cataract    Chronic kidney disease    Depression    Hyperlipidemia    Hypertension    Osteoporosis     Past Surgical History:  Procedure Laterality Date   ABDOMINAL HYSTERECTOMY     BREAST REDUCTION SURGERY     and lift   BREAST REDUCTION SURGERY Bilateral    CARPAL TUNNEL RELEASE  06/2009   right   CATARACT EXTRACTION     CHOLECYSTECTOMY     FACIAL COSMETIC SURGERY  03/2008   KNEE ARTHROSCOPY  2007   left & right   KNEE CLOSED REDUCTION Right 02/09/2022   Procedure: CLOSED MANIPULATION RIGHT TOTAL KNEE ARTHROPLASTY;  Surgeon: Kathryne Hitch, MD;  Location: [facility] OR;  Service: Orthopedics;  Laterality: Right;   LIPOSUCTION EXTREMITIES     thighs   PARTIAL KNEE ARTHROPLASTY Right 10/02/2014   Procedure: RIGHT KNEE MEDIAL UNICOMPARTMENTAL ARTHROPLASTY;  Surgeon: Kathryne Hitch, MD;  Location: [facility] ORS;  Service: Orthopedics;  Laterality: Right;   REDUCTION MAMMAPLASTY     bilateral   TOTAL KNEE REVISION  10/12/2011   Procedure: TOTAL KNEE REVISION;  Surgeon: Kathryne Hitch, MD;  Location: [facility];  Service: Orthopedics;  Laterality: Left;  Left Total Knee Revision Arthroplasty   TOTAL KNEE REVISION Right 12/09/2021    Procedure: CONVERT RIGHT UNI KNEE ARTHROPLASTY TO TOTAL KNEE ARTHROPLASTY;  Surgeon: Kathryne Hitch, MD;  Location: [facility];  Service: Orthopedics;  Laterality: Right;   TRIGGER FINGER RELEASE  06/2009   right 4th finger    Current Outpatient Medications  Medication Sig Dispense Refill   amLODipine (NORVASC) 5 MG tablet TAKE 1 TABLET BY MOUTH EVERY DAY 90 tablet 1   metoprolol tartrate (LOPRESSOR) 50 MG tablet Take 1 tablet (50 mg total) by mouth once for 1 dose. Take 90-120 minutes prior to scan. Hold for SBP less than 110. 1 tablet 0   rosuvastatin (CRESTOR) 10 MG tablet TAKE 1 TABLET(10 MG) BY MOUTH DAILY 90 tablet 1   No current facility-administered medications for this visit.    No Known Allergies  Social History   Socioeconomic History   Marital status: Divorced    Spouse name: Not on file   Number of children: 1   Years of education: Not on file   Highest education level: Not on file  Occupational History   Occupation: Caregiver   Occupation: Retired post office  Tobacco Use   Smoking status: Never   Smokeless tobacco: Never  Vaping Use   Vaping status: Never Used  Substance and Sexual Activity   Alcohol use: Yes    Alcohol/week: 1.0 standard drink of alcohol    Types: 1 Glasses of wine per week  Comment: 1 glass of wine per day   Drug use: No   Sexual activity: Not on file  Other Topics Concern   Not on file  Social History Narrative   Not on file   Social Determinants of Health   Financial Resource Strain: Low Risk  (12/14/2022)   Overall Financial Resource Strain (CARDIA)    Difficulty of Paying Living Expenses: Not very hard  Food Insecurity: No Food Insecurity (12/14/2022)   Hunger Vital Sign    Worried About Running Out of Food in the Last Year: Never true    Ran Out of Food in the Last Year: Never true  Transportation Needs: No Transportation Needs (12/14/2022)   PRAPARE - Administrator, Civil Service (Medical): No    Lack  of Transportation (Non-Medical): No  Physical Activity: Sufficiently Active (12/14/2022)   Exercise Vital Sign    Days of Exercise per Week: 4 days    Minutes of Exercise per Session: 110 min  Stress: No Stress Concern Present (12/14/2022)   [employer] of Occupational Health - Occupational Stress Questionnaire    Feeling of Stress : Not at all  Social Connections: Moderately Isolated (12/14/2022)   Social Connection and Isolation Panel [NHANES]    Frequency of Communication with Friends and Family: Once a week    Frequency of Social Gatherings with Friends and Family: Once a week    Attends Religious Services: More than 4 times per year    Active Member of [employer] or Organizations: Yes    Attends Engineer, structural: More than 4 times per year    Marital Status: Divorced  Intimate Partner Violence: Not At Risk (12/09/2021)   Humiliation, Afraid, Rape, and Kick questionnaire    Fear of Current or Ex-Partner: No    Emotionally Abused: No    Physically Abused: No    Sexually Abused: No    Family History  Problem Relation Age of Onset   Heart disease Mother    Alcohol abuse Father    Hypertension Father    Cancer Sister    Hypertension Sister    Breast cancer Neg Hx    Colon cancer Neg Hx    Colon polyps Neg Hx    Esophageal cancer Neg Hx    Stomach cancer Neg Hx    Rectal cancer Neg Hx     Review of Systems:  As stated in the HPI and otherwise negative.   BP 128/80   Pulse 73   Ht 4' 11.5" (1.511 m)   Wt 88.8 kg   SpO2 97%   BMI 38.88 kg/m   Physical Examination: General: Well developed, well nourished, NAD  HEENT: OP clear, mucus membranes moist  SKIN: warm, dry. No rashes. Neuro: No focal deficits  Musculoskeletal: Muscle strength 5/5 all ext  Psychiatric: Mood and affect normal  Neck: No JVD, no carotid bruits, no thyromegaly, no lymphadenopathy.  Lungs:Clear bilaterally, no wheezes, rhonci, crackles Cardiovascular: Regular rate and rhythm.  Systolic murmur.  Abdomen:Soft. Bowel sounds present. Non-tender.  Extremities: No lower extremity edema. Pulses are 2 + in the bilateral DP/PT.  EKG:  EKG is not ordered today. The ekg ordered today demonstrates   Recent Labs: 08/29/2022: Magnesium 2.6 12/08/2022: ALT 14; BUN 22; Creat 1.40; Hemoglobin 13.6; Platelets 199; Potassium 4.8; Sodium 141; TSH 2.42   Lipid Panel    Component Value Date/Time   CHOL 180 12/08/2022 0932   TRIG 162 (H) 12/08/2022 0932   HDL 37 (L) 12/08/2022  0932   CHOLHDL 4.9 12/08/2022 0932   VLDL 43 (H) 02/09/2016 1014   LDLCALC 115 (H) 12/08/2022 0932     Wt Readings from Last 3 Encounters:  01/18/23 88.8 kg  12/14/22 88.5 kg  08/29/22 86 kg     Assessment and Plan:   1. Dyspnea on exertion/Chest pain: Cannot exclude angina. Will arrange an echo to assess LVEF and exclude structural heart disease. Will arrange a coronary CTA to exclude CAD.   2. Cardiac murmur: Echo to assess  Labs/ tests ordered today include:   Orders Placed This Encounter  Procedures   CT CORONARY MORPH W/CTA COR W/SCORE W/CA W/CM &/OR WO/CM   Basic Metabolic Panel (BMET)   ECHOCARDIOGRAM COMPLETE   Disposition:   F/U with me in 4-6 weeks  Signed, Verne Carrow, MD, [facility] 01/18/2023 2:53 PM    [facility] [address] [place], [place], [place]  [address] Phone: [phone]; Fax: [phone]

## 2023-01-19 LAB — BASIC METABOLIC PANEL
BUN/Creatinine Ratio: 15 (ref 12–28)
BUN: 21 mg/dL (ref 8–27)
CO2: 25 mmol/L (ref 20–29)
Calcium: 10.4 mg/dL — ABNORMAL HIGH (ref 8.7–10.3)
Chloride: 105 mmol/L (ref 96–106)
Creatinine, Ser: 1.43 mg/dL — ABNORMAL HIGH (ref 0.57–1.00)
Glucose: 103 mg/dL — ABNORMAL HIGH (ref 70–99)
Potassium: 4.7 mmol/L (ref 3.5–5.2)
Sodium: 144 mmol/L (ref 134–144)
eGFR: 37 mL/min/{1.73_m2} — ABNORMAL LOW (ref >59)

## 2023-01-22 DIAGNOSIS — M9902 Segmental and somatic dysfunction of thoracic region: Secondary | ICD-10-CM | POA: Diagnosis not present

## 2023-01-22 DIAGNOSIS — M5032 Other cervical disc degeneration, mid-cervical region, unspecified level: Secondary | ICD-10-CM | POA: Diagnosis not present

## 2023-01-22 DIAGNOSIS — M9901 Segmental and somatic dysfunction of cervical region: Secondary | ICD-10-CM | POA: Diagnosis not present

## 2023-01-25 ENCOUNTER — Ambulatory Visit: Payer: Medicare Other | Admitting: Cardiology

## 2023-02-01 ENCOUNTER — Telehealth (HOSPITAL_COMMUNITY): Payer: Self-pay | Admitting: *Deleted

## 2023-02-01 ENCOUNTER — Ambulatory Visit: Payer: Medicare Other | Admitting: Physician Assistant

## 2023-02-01 ENCOUNTER — Telehealth: Payer: Self-pay | Admitting: Cardiovascular Disease

## 2023-02-01 ENCOUNTER — Ambulatory Visit (AMBULATORY_SURGERY_CENTER): Payer: Medicare Other

## 2023-02-01 VITALS — Ht <= 58 in | Wt 195.0 lb

## 2023-02-01 DIAGNOSIS — Z860100 Personal history of colon polyps, unspecified: Secondary | ICD-10-CM

## 2023-02-01 MED ORDER — METOPROLOL TARTRATE 50 MG PO TABS: ORAL_TABLET | ORAL | 0 refills | Status: DC

## 2023-02-01 MED ORDER — METOPROLOL TARTRATE 50 MG PO TABS: 50.0000 mg | ORAL_TABLET | Freq: Once | ORAL | 0 refills | Status: DC

## 2023-02-01 MED ORDER — NA SULFATE-K SULFATE-MG SULF 17.5-3.13-1.6 GM/177ML PO SOLN
1.0000 | Freq: Once | ORAL | 0 refills | Status: AC
Start: 2023-02-01 — End: 2023-02-01

## 2023-02-01 NOTE — Telephone Encounter (Signed)
Received a transferred call about the patient's upcoming cardiac imaging study; pt verbalizes understanding of appt date/time, parking situation and where to check in, pre-test NPO status and medications ordered, and verified current allergies; name and call back number provided for further questions should they arise  Larey Brick RN Navigator Cardiac Imaging [facility] [phone] office [phone] cell  Patient to take 50mg  metoprolol tartrate two hours prior to her cardiac CT scan.

## 2023-02-01 NOTE — Telephone Encounter (Signed)
Spoke w patient.  Reviewed testing plans.   Reminded her the Lopressor is for the CT scan, not the echo and send a new prescription in.  Gave her the number for scheduling the CT to call and get it scheduled.  CT had been calling her, and leaving VM but she is not seeing the calls or hearing the VMs.

## 2023-02-01 NOTE — Progress Notes (Signed)
Pt's name and DOB verified at the beginning of the pre-visit wit 2 identifiers  Pt denies any difficulty with ambulating,sitting, laying down or rolling side to side  Pt has no issues with ambulation   Pt has no issues moving head neck or swallowing  No egg or soy allergy known to patient   No issues known to pt with past sedation with any surgeries or procedures  Pt denies having issues being intubated  No FH of Malignant Hyperthermia  Pt is not on diet pills or shots  Pt is not on home 02   Pt is not on blood thinners   Pt denies issues with constipation   Pt is not on dialysis  Pt denise any abnormal heart rhythms   Pt at CARD office to have dye due to SOB with Activity  Pt encouraged to use to use Singlecare or Goodrx to reduce cost   Patient's chart reviewed by Cathlyn Parsons CNRA prior to pre-visit and patient appropriate for the [facility].  Pre-visit completed and red dot placed by patient's name on their procedure day (on provider's schedule).  .  Visit by phone  Pt states weight 195 lb  Instructed pt why it is important to and  to call if they have any changes in health or new medications. Directed them to the # given and on instructions.     Instructions reviewed. Pt given both [facility] main # and MD on call # prior to instructions.  Pt states understanding. Instructed to review again prior to procedure. Pt states they will.   Instructions sent by mail with coupon and by My Chart  Coupon sent via text to mobile phone and pt verified they received it

## 2023-02-01 NOTE — Telephone Encounter (Signed)
Pt c/o medication issue:  1. Name of Medication:  metoprolol tartrate (LOPRESSOR) 50 MG tablet (Expired)   2. How are you currently taking this medication (dosage and times per day)? Accidentally taken this morning   3. Are you having a reaction (difficulty breathing--STAT)? No   4. What is your medication issue? Patient is calling stating she accidentally took this medication this morning in error. She is requesting another tablet be prescribed for her to take prior to her echo to the same pharmacy.   Please advise.

## 2023-02-05 ENCOUNTER — Other Ambulatory Visit: Payer: Self-pay | Admitting: Cardiovascular Disease

## 2023-02-05 ENCOUNTER — Ambulatory Visit
Admission: RE | Admit: 2023-02-05 | Discharge: 2023-02-05 | Disposition: A | Discharge status: Home / Self Care | Payer: Medicare Other | Source: Ambulatory Visit | Attending: Cardiovascular Disease | Admitting: Cardiovascular Disease

## 2023-02-05 DIAGNOSIS — R0602 Shortness of breath: Secondary | ICD-10-CM

## 2023-02-05 DIAGNOSIS — R072 Precordial pain: Secondary | ICD-10-CM

## 2023-02-05 DIAGNOSIS — R931 Abnormal findings on diagnostic imaging of heart and coronary circulation: Secondary | ICD-10-CM

## 2023-02-05 MED ORDER — DILTIAZEM HCL 25 MG/5ML IV SOLN
10.0000 mg | INTRAVENOUS | Status: DC | PRN
  Filled 2023-02-05: qty 5

## 2023-02-05 MED ORDER — METOPROLOL TARTRATE 5 MG/5ML IV SOLN
10.0000 mg | Freq: Once | INTRAVENOUS | Status: DC | PRN
  Filled 2023-02-05: qty 10

## 2023-02-05 MED ORDER — IOHEXOL 350 MG/ML SOLN: 60.0000 mL | Freq: Once | INTRAVENOUS | Status: DC | PRN

## 2023-02-05 MED ORDER — NITROGLYCERIN 0.4 MG SL SUBL
0.8000 mg | SUBLINGUAL_TABLET | Freq: Once | SUBLINGUAL | Status: AC
  Administered 2023-02-05: 0.8 mg via SUBLINGUAL
  Filled 2023-02-05: qty 25

## 2023-02-05 MED ORDER — IOHEXOL 350 MG/ML SOLN
80.0000 mL | Freq: Once | INTRAVENOUS | Status: AC | PRN
  Administered 2023-02-05: 80 mL via INTRAVENOUS

## 2023-02-05 NOTE — Progress Notes (Signed)
Patient tolerated procedure well. Ambulate w/o difficulty. Denies any lightheadedness or being dizzy. Pt denies any pain at this time. Sitting in chair. Pt is encouraged to drink additional water throughout the day and reason explained to patient. Patient verbalized understanding and all questions answered. ABC intact. No further needs at this time. Discharge from procedure area w/o issues. 

## 2023-02-08 ENCOUNTER — Ambulatory Visit (HOSPITAL_COMMUNITY)
Admission: RE | Admit: 2023-02-08 | Discharge: 2023-02-08 | Disposition: A | Discharge status: Home / Self Care | Payer: Medicare Other | Source: Ambulatory Visit | Attending: Cardiovascular Disease | Admitting: Cardiovascular Disease

## 2023-02-08 DIAGNOSIS — E785 Hyperlipidemia, unspecified: Secondary | ICD-10-CM | POA: Insufficient documentation

## 2023-02-08 DIAGNOSIS — N183 Chronic kidney disease, stage 3 unspecified: Secondary | ICD-10-CM | POA: Insufficient documentation

## 2023-02-08 DIAGNOSIS — I129 Hypertensive chronic kidney disease with stage 1 through stage 4 chronic kidney disease, or unspecified chronic kidney disease: Secondary | ICD-10-CM | POA: Insufficient documentation

## 2023-02-08 DIAGNOSIS — I083 Combined rheumatic disorders of mitral, aortic and tricuspid valves: Secondary | ICD-10-CM | POA: Diagnosis not present

## 2023-02-08 DIAGNOSIS — R0602 Shortness of breath: Secondary | ICD-10-CM | POA: Diagnosis not present

## 2023-02-08 DIAGNOSIS — R0600 Dyspnea, unspecified: Secondary | ICD-10-CM | POA: Diagnosis present

## 2023-02-08 DIAGNOSIS — E1122 Type 2 diabetes mellitus with diabetic chronic kidney disease: Secondary | ICD-10-CM | POA: Insufficient documentation

## 2023-02-08 DIAGNOSIS — R011 Cardiac murmur, unspecified: Secondary | ICD-10-CM

## 2023-02-08 LAB — ECHOCARDIOGRAM COMPLETE
AR max vel: 1.65 cm2
AV Area VTI: 1.54 cm2
AV Area mean vel: 1.56 cm2
AV Mean grad: 7 mm[Hg]
AV Peak grad: 13.7 mm[Hg]
Ao pk vel: 1.85 m/s
Area-P 1/2: 2.5 cm2
S' Lateral: 2.7 cm

## 2023-02-08 NOTE — Progress Notes (Signed)
Echocardiogram 2D Echocardiogram has been performed.  Kelly [name] 02/08/2023, 11:57 AM

## 2023-02-12 ENCOUNTER — Other Ambulatory Visit: Payer: Self-pay

## 2023-02-12 ENCOUNTER — Ambulatory Visit: Payer: Medicare Other | Admitting: Physician Assistant

## 2023-02-12 ENCOUNTER — Encounter: Payer: Self-pay | Admitting: Sports Medicine

## 2023-02-12 ENCOUNTER — Encounter: Payer: Self-pay | Admitting: Physician Assistant

## 2023-02-12 ENCOUNTER — Ambulatory Visit (INDEPENDENT_AMBULATORY_CARE_PROVIDER_SITE_OTHER): Payer: Medicare Other

## 2023-02-12 ENCOUNTER — Ambulatory Visit: Payer: Medicare Other | Admitting: Sports Medicine

## 2023-02-12 DIAGNOSIS — M1611 Unilateral primary osteoarthritis, right hip: Secondary | ICD-10-CM

## 2023-02-12 DIAGNOSIS — M25551 Pain in right hip: Secondary | ICD-10-CM

## 2023-02-12 MED ORDER — METHYLPREDNISOLONE ACETATE 40 MG/ML IJ SUSP
40.0000 mg | INTRAMUSCULAR | Status: AC | PRN
Start: 2023-02-12 — End: 2023-02-12
  Administered 2023-02-12: 40 mg via INTRA_ARTICULAR

## 2023-02-12 MED ORDER — LIDOCAINE HCL 1 % IJ SOLN
4.0000 mL | INTRAMUSCULAR | Status: AC | PRN
Start: 2023-02-12 — End: 2023-02-12
  Administered 2023-02-12: 4 mL

## 2023-02-12 NOTE — Progress Notes (Signed)
Office Visit Note   Patient: Kelly [name]           Date of Birth: [birth year]           MRN: [id number] Visit Date: 02/12/2023              Requested by: Margaree Mackintosh, MD [address] [place],  [place] [address] PCP: Margaree Mackintosh, MD   Assessment & Plan: Visit Diagnoses:  1. Pain in right hip   2. Primary osteoarthritis of right hip     Plan: Will have her undergo right hip intra-articular injection under ultrasound with Dr. Shon Baton.  See her back in 4 weeks see what type of response she had.  Questions were encouraged and answered at length.   Follow-Up Instructions: Return in about 4 weeks (around 03/12/2023).   Orders:  Orders Placed This Encounter  Procedures   XR HIP UNILAT W OR W/O PELVIS 2-3 VIEWS RIGHT   No orders of the defined types were placed in this encounter.     Procedures: No procedures performed   Clinical Data: No additional findings.   Subjective: Chief Complaint  Patient presents with   Right Hip - Pain    HPI Mrs. Bockover comes in today due to right hip pain that started about 3 weeks ago.  She describes the pain as a sharp stabbing pain in the groin.  Worse with going from a sitting to standing position.  No known injury.  She states pain is worse with steps or walking long distance.  Ranks her pain to be 5-6 out of 10 pain at worst.  She is taking Tylenol for the pain at this point in time.  She is diabetic last examination 1C was 5.5  , 2 months ago.  She is being worked up for some shortness of breath that she has developed with exercise. Review of Systems  Constitutional:  Negative for chills and fever.  Respiratory:  Positive for shortness of breath.      Objective: Vital Signs: There were no vitals taken for this visit.  Physical Exam Constitutional:      Appearance: She is not ill-appearing or diaphoretic.  Neurological:     Mental Status: She is alert and oriented to person, place, and time.  Psychiatric:         Mood and Affect: Mood normal.     Ortho Exam Bilateral hips good range of motion left hip without pain.  Right hip pain with external and internal rotation.  Slightly diminished internal rotation compared to left hip.  Nontender over the trochanteric regions bilaterally. Specialty Comments:  No specialty comments available.  Imaging: XR HIP UNILAT W OR W/O PELVIS 2-3 VIEWS RIGHT Result Date: 02/12/2023 AP Pel over this lateral view right hip: Bilateral hips well located.  No acute fractures acute findings.  No evidence of AVN.  Moderate narrowing right hip joint compared to left.    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) 02/09/2022   Status post revision of total knee, right 12/09/2021   Arthritis of right knee 12/08/2021   Chronic left-sided low back pain with left-sided sciatica 12/17/2019   Type 2 diabetes mellitus with obesity (HCC) 12/13/2019   Chronic rhinitis 03/03/2019   Constipation 10/31/2016   Impaired glucose tolerance 10/31/2016   Stage III chronic kidney disease (HCC) 10/31/2016   Urge urinary incontinence 10/31/2016   Status post right partial  knee replacement 10/02/2014   Anxiety and depression 11/06/2011   Hypertension 10/13/2010   Hyperlipidemia 10/13/2010   Osteoarthritis 10/13/2010   UNSPECIFIED IRON DEFICIENCY ANEMIA 04/11/2010   DIARRHEA 04/11/2010   Past Medical History:  Diagnosis Date   Arthritis    Cataract    Chronic kidney disease    Hyperlipidemia    Hypertension    Osteoporosis    Shortness of breath    With activity    Family History  Problem Relation Age of Onset   Heart disease Mother    Alcohol abuse Father    Hypertension Father    Cancer Sister    Hypertension Sister    Breast cancer Neg Hx    Colon cancer Neg Hx    Colon polyps Neg Hx    Esophageal cancer Neg Hx    Stomach cancer Neg Hx    Rectal cancer Neg Hx     Past Surgical  History:  Procedure Laterality Date   ABDOMINAL HYSTERECTOMY     BREAST REDUCTION SURGERY     and lift   BREAST REDUCTION SURGERY Bilateral    CARPAL TUNNEL RELEASE  06/2009   right   CATARACT EXTRACTION     CHOLECYSTECTOMY     FACIAL COSMETIC SURGERY  03/2008   KNEE ARTHROSCOPY  2007   left & right   KNEE CLOSED REDUCTION Right 02/09/2022   Procedure: CLOSED MANIPULATION RIGHT TOTAL KNEE ARTHROPLASTY;  Surgeon: Kathryne Hitch, MD;  Location: [facility] OR;  Service: Orthopedics;  Laterality: Right;   LIPOSUCTION EXTREMITIES     thighs   PARTIAL KNEE ARTHROPLASTY Right 10/02/2014   Procedure: RIGHT KNEE MEDIAL UNICOMPARTMENTAL ARTHROPLASTY;  Surgeon: Kathryne Hitch, MD;  Location: [facility];  Service: Orthopedics;  Laterality: Right;   REDUCTION MAMMAPLASTY     bilateral   TOTAL KNEE REVISION  10/12/2011   Procedure: TOTAL KNEE REVISION;  Surgeon: Kathryne Hitch, MD;  Location: [facility];  Service: Orthopedics;  Laterality: Left;  Left Total Knee Revision Arthroplasty   TOTAL KNEE REVISION Right 12/09/2021   Procedure: CONVERT RIGHT UNI KNEE ARTHROPLASTY TO TOTAL KNEE ARTHROPLASTY;  Surgeon: Kathryne Hitch, MD;  Location: [facility];  Service: Orthopedics;  Laterality: Right;   TRIGGER FINGER RELEASE  06/2009   right 4th finger   Social History   Occupational History   Occupation: Engineer, structural   Occupation: Retired post office  Tobacco Use   Smoking status: Never   Smokeless tobacco: Never  Vaping Use   Vaping status: Never Used  Substance and Sexual Activity   Alcohol use: Yes    Alcohol/week: 1.0 standard drink of alcohol    Types: 1 Glasses of wine per week    Comment: 1 glass of wine per day   Drug use: No   Sexual activity: Not on file

## 2023-02-12 NOTE — Progress Notes (Signed)
   Procedure Note  Patient: Kelly [name]             Date of Birth: 1945-08-20           MRN: [id number]             Visit Date: 02/12/2023  Procedures: Visit Diagnoses:  1. Pain in right hip   2. Primary osteoarthritis of right hip    Large Joint Inj: R hip joint on 02/12/2023 2:59 PM Indications: pain Details: 22 G 3.5 in needle, ultrasound-guided anterior approach Medications: 4 mL lidocaine 1 %; 40 mg methylPREDNISolone acetate 40 MG/ML Outcome: tolerated well, no immediate complications  Procedure: US-guided intra-articular hip injection, Right After discussion on risks/benefits/indications and informed verbal consent was obtained, a timeout was performed. Patient was lying supine on exam table. The hip was cleaned with betadine and alcohol swabs. Then utilizing ultrasound guidance, the patient's femoral head and neck junction was identified and subsequently injected with 4:1 lidocaine:depomedrol via an in-plane approach with ultrasound visualization of the injectate administered into the hip joint. Patient tolerated procedure well without immediate complications.  Procedure, treatment alternatives, risks and benefits explained, specific risks discussed. Consent was given by the patient. Immediately prior to procedure a time out was called to verify the correct patient, procedure, equipment, support staff and site/side marked as required. Patient was prepped and draped in the usual sterile fashion.     - follow-up with Rexene Edison, PAC and/or Dr. Magnus Ivan as indicated in 4 weeks; I am happy to see them as needed  Madelyn Brunner, DO Primary Care Sports Medicine Physician  [facility]  This note was dictated using Dragon naturally speaking software and may contain errors in syntax, spelling, or content which have not been identified prior to signing this note.

## 2023-02-16 ENCOUNTER — Ambulatory Visit: Payer: Medicare Other | Admitting: Cardiovascular Disease

## 2023-02-16 ENCOUNTER — Ambulatory Visit: Payer: Medicare Other | Attending: Nurse Practitioner | Admitting: Nurse Practitioner

## 2023-02-16 ENCOUNTER — Encounter: Payer: Self-pay | Admitting: Nurse Practitioner

## 2023-02-16 VITALS — BP 130/68 | HR 72 | Ht <= 58 in | Wt 194.0 lb

## 2023-02-16 DIAGNOSIS — E785 Hyperlipidemia, unspecified: Secondary | ICD-10-CM | POA: Insufficient documentation

## 2023-02-16 DIAGNOSIS — I10 Essential (primary) hypertension: Secondary | ICD-10-CM | POA: Insufficient documentation

## 2023-02-16 DIAGNOSIS — Z79899 Other long term (current) drug therapy: Secondary | ICD-10-CM | POA: Diagnosis not present

## 2023-02-16 DIAGNOSIS — I2510 Atherosclerotic heart disease of native coronary artery without angina pectoris: Secondary | ICD-10-CM | POA: Insufficient documentation

## 2023-02-16 DIAGNOSIS — Z01810 Encounter for preprocedural cardiovascular examination: Secondary | ICD-10-CM | POA: Diagnosis not present

## 2023-02-16 MED ORDER — ASPIRIN 81 MG PO TBEC: 81.0000 mg | DELAYED_RELEASE_TABLET | Freq: Every day | ORAL | 3 refills | Status: DC

## 2023-02-16 MED ORDER — AMLODIPINE BESYLATE 10 MG PO TABS: 10.0000 mg | ORAL_TABLET | Freq: Every day | ORAL | 3 refills | Status: AC

## 2023-02-16 MED ORDER — ROSUVASTATIN CALCIUM 20 MG PO TABS: 20.0000 mg | ORAL_TABLET | Freq: Every day | ORAL | 3 refills | Status: DC

## 2023-02-16 NOTE — Patient Instructions (Addendum)
 Medication Instructions:  Please START taking baby aspirin  (81 mg) daily.  Please INCREASE your dose of crestor  to 20 mg daily.  Please INCREASE your dose of amlodipine  to 10 mg daily.  *If you need a refill on your cardiac medications before your next appointment, please call your pharmacy*   Lab Work: Please complete an LP(a) today at the first floor [facility] before you leave.   Please complete a FASTING lipid panel and ALT the first week of March 2025 at any [facility].   If you have labs (blood work) drawn today and your tests are completely normal, you will receive your results only by: MyChart Message (if you have MyChart) OR A paper copy in the mail If you have any lab test that is abnormal or we need to change your treatment, we will call you to review the results.   Testing/Procedures: None.   Follow-Up:  Your next appointment:   6 month(s)  Provider:   Lonni Cash, MD  or Jackee Alberts, NP   Other Instructions Please check your blood pressures 1-2 times daily for 2 weeks. Check your BP 2-3 hours after you have taken any blood pressure medication. At the end of 2 weeks, submit your readings over MyChart or call our office. All  our operators are trained to take down your readings.  Please start wearing compression stockings.

## 2023-02-16 NOTE — Progress Notes (Addendum)
 Cardiology Office Note    [name] Name: Kelly [name] Date of Encounter: 02/16/2023  Primary Care Provider:  Perri Ronal PARAS, MD Primary Cardiologist:  Lonni Cash, MD Primary Electrophysiologist: None   Past Medical History    Past Medical History:  Diagnosis Date   Arthritis    Cataract    Chronic kidney disease    Hyperlipidemia    Hypertension    Osteoporosis    Shortness of breath    With activity    History of Present Illness  Kelly [name] is a 78 y.o. female with a PMH of coronary artery disease, HTN, HLD  CKD, arthritis who presents today to discuss CTA results.  Kelly [name] was seen initially on 01/18/2023 by Dr. Cash for complaint of dyspnea on exertion.  He had a chest x-ray completed by her PCP that showed no active lung disease and EKG was completed with no ischemic changes.  She denied any palpitations or syncope and was sent for coronary CTA for further evaluation.  CTA results were abnormal with elevated calcium  score of 1112 and calcified plaque noted in Kelly mid LAD with moderate stenosis (50-69%) and mild stenosis noted in RCA and left circumflex (25-49%).  Results were submitted for FFR which showed no significant stenosis.  2D echo was also completed and showed normal EF of 60-65% with no RWMA and mild MVR and normal LA/RA.  Kelly [name] presents today for review and follow-up of coronary CTA. She presents with shortness of breath. Kelly [name] reports that Kelly symptom has been bothersome, particularly when lifting heavy objects or doing strenuous activities. Kelly symptom started after a knee surgery a year ago, which led to a decrease in Kelly [name]'s physical activity level. Kelly [name] also reports lower extremity swelling, which has been a long-standing issue. Kelly [name] is currently on amlodipine  and Crestor  for her heart condition. Kelly [name] is scheduled for a procedure involving a coin insertion into Kelly ankle for urinary problems. Kelly  [name] also reports a deteriorating hip, which has affected her physical activity level.  [name] denies chest pain, palpitations, dyspnea, PND, orthopnea, nausea, vomiting, dizziness, syncope, edema, weight gain, or early satiety.   Review of Systems  Please see Kelly history of present illness.    All other systems reviewed and are otherwise negative except as noted above.  Physical Exam    Wt Readings from Last 3 Encounters:  02/01/23 195 lb (88.5 kg)  01/18/23 195 lb 12.8 oz (88.8 kg)  12/14/22 195 lb (88.5 kg)   CD:Uyzmz were no vitals filed for this visit.,There is no height or weight on file to calculate BMI. GEN: Well nourished, well developed in no acute distress Neck: No JVD; No carotid bruits Pulmonary: Clear to auscultation without rales, wheezing or rhonchi  Cardiovascular: Normal rate. Regular rhythm. Normal S1. Normal S2.   Murmurs: There is no murmur.  ABDOMEN: Soft, non-tender, non-distended EXTREMITIES:  No edema; No deformity   EKG/LABS/ Recent Cardiac Studies   ECG personally reviewed by me today -none completed today  Risk Assessment/Calculations:          Lab Results  Component Value Date   WBC 3.5 (L) 12/08/2022   HGB 13.6 12/08/2022   HCT 41.5 12/08/2022   MCV 92.8 12/08/2022   PLT 199 12/08/2022   Lab Results  Component Value Date   CREATININE 1.43 (H) 01/18/2023   BUN 21 01/18/2023   NA 144 01/18/2023   K 4.7 01/18/2023   CL 105 01/18/2023  CO2 25 01/18/2023   Lab Results  Component Value Date   CHOL 180 12/08/2022   HDL 37 (L) 12/08/2022   LDLCALC 115 (H) 12/08/2022   TRIG 162 (H) 12/08/2022   CHOLHDL 4.9 12/08/2022    Lab Results  Component Value Date   HGBA1C 5.5 12/08/2022   Assessment & Plan    1.  Coronary artery disease: Coronary Artery Disease Elevated calcium  score (1112) with moderate stenosis (50-69%) in Kelly left anterior descending artery. Fractional flow reserve rate showed no significant stenosis, indicating no  need for invasive heart catheterization. Discussed Kelly importance of lifestyle modifications and medication adjustments to prevent progression of disease. -Increase Crestor  to 20mg  at night. -Start baby aspirin  81mg  daily. -Order Lipoprotein A test to assess genetic predisposition to coronary disease. -Recheck cholesterol and liver function tests in 8 weeks.  2.  Hyperlipidemia: -[name]'s recent calcium  score revealed level of 1112 -We will increase Crestor  to 20 mg daily -We will recheck LFTs and lipids in 8 weeks  3. Hypertension: Blood pressure readings at home vary, with some readings in Kelly 140-150/70s range. Mild edema noted in lower extremities, possibly related to amlodipine . -Increase amlodipine  to 10mg  daily. -Check blood pressure at home and log readings for Kelly next 2 weeks. -Recommend use of compression stockings for lower extremity edema.  4. Dyspnea: Shortness of breath noted, not related to heart structure. Possible deconditioning due to decreased physical activity following knee surgery. -Encourage reconditioning through increased physical activity as tolerated. -Consider referral to pulmonologist if symptoms persist despite reconditioning.  5.  Preoperative clearance: -[name]'s RCRI score is 0.4% -Kelly [name] affirms she has been doing well without any new cardiac symptoms. They are able to achieve 5 METS without cardiac limitations. Therefore, based on ACC/AHA guidelines, Kelly [name] would be at acceptable risk for Kelly planned procedure without further cardiovascular testing. Kelly [name] was advised that if she develops new symptoms prior to surgery to contact our office to arrange for a follow-up visit, and she verbalized understanding.    Disposition: Follow-up with Lonni Cash, MD or APP in 6 months    Signed, Wyn Raddle, Jackee Shove, NP 02/16/2023, 7:51 AM [facility]

## 2023-02-17 LAB — LIPOPROTEIN A (LPA): Lipoprotein (a): 283.3 nmol/L — ABNORMAL HIGH (ref <75.0)

## 2023-02-20 ENCOUNTER — Telehealth: Payer: Self-pay

## 2023-02-20 DIAGNOSIS — E785 Hyperlipidemia, unspecified: Secondary | ICD-10-CM

## 2023-02-20 DIAGNOSIS — I2510 Atherosclerotic heart disease of native coronary artery without angina pectoris: Secondary | ICD-10-CM

## 2023-02-20 NOTE — Telephone Encounter (Signed)
 Placed an order for Lipid Clinic referral.

## 2023-02-20 NOTE — Telephone Encounter (Signed)
-----   Message from Wyn Raddle, Jackee Shove sent at 02/17/2023  9:30 AM EST ----- Please let patient know that her LP(a) is elevated indicating an increased risk for cardiovascular diseases such as coronary heart disease, aortic valve stenosis, and potentially increased risk for cardiovascular events even if other cholesterol levels are normal.  Lp(a) levels do not respond well to lifestyle modifications or traditional lipid-lowering treatments such as statins or PCSK9 inhibitors, but may be modestly reduced by PCSK9 inhibitors and more significantly by therapies targeting apolipoprotein(a) therefore we will refer her to our lipid clinic to discuss alternative therapies further.  Please let me know if you have any further questions.   Jackee Wyn, NP

## 2023-02-28 ENCOUNTER — Other Ambulatory Visit: Payer: Self-pay | Admitting: Internal Medicine

## 2023-03-01 ENCOUNTER — Encounter: Payer: Medicare Other | Admitting: Internal Medicine

## 2023-03-06 ENCOUNTER — Ambulatory Visit: Payer: Medicare Other | Admitting: Physician Assistant

## 2023-03-08 ENCOUNTER — Encounter: Payer: Self-pay | Admitting: Internal Medicine

## 2023-03-08 ENCOUNTER — Ambulatory Visit (AMBULATORY_SURGERY_CENTER): Payer: Medicare Other | Admitting: Internal Medicine

## 2023-03-08 VITALS — BP 111/60 | HR 68 | Temp 97.0°F | Resp 17 | Ht <= 58 in | Wt 195.0 lb

## 2023-03-08 DIAGNOSIS — Z860101 Personal history of adenomatous and serrated colon polyps: Secondary | ICD-10-CM | POA: Diagnosis not present

## 2023-03-08 DIAGNOSIS — Z860100 Personal history of colon polyps, unspecified: Secondary | ICD-10-CM

## 2023-03-08 DIAGNOSIS — D122 Benign neoplasm of ascending colon: Secondary | ICD-10-CM | POA: Diagnosis not present

## 2023-03-08 DIAGNOSIS — E785 Hyperlipidemia, unspecified: Secondary | ICD-10-CM | POA: Diagnosis not present

## 2023-03-08 DIAGNOSIS — K5730 Diverticulosis of large intestine without perforation or abscess without bleeding: Secondary | ICD-10-CM

## 2023-03-08 DIAGNOSIS — Z1211 Encounter for screening for malignant neoplasm of colon: Secondary | ICD-10-CM

## 2023-03-08 DIAGNOSIS — K6389 Other specified diseases of intestine: Secondary | ICD-10-CM

## 2023-03-08 DIAGNOSIS — I10 Essential (primary) hypertension: Secondary | ICD-10-CM | POA: Diagnosis not present

## 2023-03-08 MED ORDER — SODIUM CHLORIDE 0.9 % IV SOLN: 500.0000 mL | Freq: Once | INTRAVENOUS | Status: DC

## 2023-03-08 NOTE — Patient Instructions (Signed)
Resume previous diet and medications.  Handout provided on polyps.  Follow up colonoscopy not recommended at this time.   YOU HAD AN ENDOSCOPIC PROCEDURE TODAY AT THE [facility]:   Refer to the procedure report that was given to you for any specific questions about what was found during the examination.  If the procedure report does not answer your questions, please call your gastroenterologist to clarify.  If you requested that your care partner not be given the details of your procedure findings, then the procedure report has been included in a sealed envelope for you to review at your convenience later.  YOU SHOULD EXPECT: Some feelings of bloating in the abdomen. Passage of more gas than usual.  Walking can help get rid of the air that was put into your GI tract during the procedure and reduce the bloating. If you had a lower endoscopy (such as a colonoscopy or flexible sigmoidoscopy) you may notice spotting of blood in your stool or on the toilet paper. If you underwent a bowel prep for your procedure, you may not have a normal bowel movement for a few days.  Please Note:  You might notice some irritation and congestion in your nose or some drainage.  This is from the oxygen used during your procedure.  There is no need for concern and it should clear up in a day or so.  SYMPTOMS TO REPORT IMMEDIATELY:  Following lower endoscopy (colonoscopy or flexible sigmoidoscopy):  Excessive amounts of blood in the stool  Significant tenderness or worsening of abdominal pains  Swelling of the abdomen that is new, acute  Fever of 100F or higher  For urgent or emergent issues, a gastroenterologist can be reached at any hour by calling [phone]. Do not use MyChart messaging for urgent concerns.    DIET:  We do recommend a small meal at first, but then you may proceed to your regular diet.  Drink plenty of fluids but you should avoid alcoholic beverages for 24 hours.  ACTIVITY:  You  should plan to take it easy for the rest of today and you should NOT DRIVE or use heavy machinery until tomorrow (because of the sedation medicines used during the test).    FOLLOW UP: Our staff will call the number listed on your records the next business day following your procedure.  We will call around 7:15- 8:00 am to check on you and address any questions or concerns that you may have regarding the information given to you following your procedure. If we do not reach you, we will leave a message.     If any biopsies were taken you will be contacted by phone or by letter within the next 1-3 weeks.  Please call us at [phone] if you have not heard about the biopsies in 3 weeks.    SIGNATURES/CONFIDENTIALITY: You and/or your care partner have signed paperwork which will be entered into your electronic medical record.  These signatures attest to the fact that that the information above on your After Visit Summary has been reviewed and is understood.  Full responsibility of the confidentiality of this discharge information lies with you and/or your care-partner.

## 2023-03-08 NOTE — Progress Notes (Signed)
Pt's states no medical or surgical changes since previsit or office visit. 

## 2023-03-08 NOTE — Progress Notes (Signed)
HISTORY OF PRESENT ILLNESS:  Kelly [name] is a 78 y.o. female with history multiple adenomatous colon polyps.  Presents today for surveillance colonoscopy  REVIEW OF SYSTEMS:  All non-GI ROS negative except for  Past Medical History:  Diagnosis Date   Arthritis    Cataract    Chronic kidney disease    Hyperlipidemia    Hypertension    Osteoporosis    Shortness of breath    With activity    Past Surgical History:  Procedure Laterality Date   ABDOMINAL HYSTERECTOMY     BREAST REDUCTION SURGERY     and lift   BREAST REDUCTION SURGERY Bilateral    CARPAL TUNNEL RELEASE  06/2009   right   CATARACT EXTRACTION     CHOLECYSTECTOMY     FACIAL COSMETIC SURGERY  03/2008   KNEE ARTHROSCOPY  2007   left & right   KNEE CLOSED REDUCTION Right 02/09/2022   Procedure: CLOSED MANIPULATION RIGHT TOTAL KNEE ARTHROPLASTY;  Surgeon: Kathryne Hitch, MD;  Location: [facility] OR;  Service: Orthopedics;  Laterality: Right;   LIPOSUCTION EXTREMITIES     thighs   PARTIAL KNEE ARTHROPLASTY Right 10/02/2014   Procedure: RIGHT KNEE MEDIAL UNICOMPARTMENTAL ARTHROPLASTY;  Surgeon: Kathryne Hitch, MD;  Location: [facility];  Service: Orthopedics;  Laterality: Right;   REDUCTION MAMMAPLASTY     bilateral   TOTAL KNEE REVISION  10/12/2011   Procedure: TOTAL KNEE REVISION;  Surgeon: Kathryne Hitch, MD;  Location: [facility];  Service: Orthopedics;  Laterality: Left;  Left Total Knee Revision Arthroplasty   TOTAL KNEE REVISION Right 12/09/2021   Procedure: CONVERT RIGHT UNI KNEE ARTHROPLASTY TO TOTAL KNEE ARTHROPLASTY;  Surgeon: Kathryne Hitch, MD;  Location: [facility];  Service: Orthopedics;  Laterality: Right;   TRIGGER FINGER RELEASE  06/2009   right 4th finger    Social History Kelly [name]  reports that she has never smoked. She has never used smokeless tobacco. She reports current alcohol use of about 1.0 standard drink of alcohol per week. She reports that she does not  use drugs.  family history includes Alcohol abuse in her father; Cancer in her sister; Heart disease in her mother; Hypertension in her father and sister.  No Known Allergies     PHYSICAL EXAMINATION: Vital signs: BP (!) 121/49   Pulse 67   Temp (!) 97 F (36.1 C) (Temporal)   Ht 4\' 9"  (1.448 m)   Wt 195 lb (88.5 kg)   SpO2 96%   BMI 42.20 kg/m  General: Well-developed, well-nourished, no acute distress HEENT: Sclerae are anicteric, conjunctiva pink. Oral mucosa intact Lungs: Clear Heart: Regular Abdomen: soft, nontender, nondistended, no obvious ascites, no peritoneal signs, normal bowel sounds. No organomegaly. Extremities: No edema Psychiatric: alert and oriented x3. Cooperative     ASSESSMENT:  Personal history multiple adenomatous polyps   PLAN:  Surveillance colonoscopy with

## 2023-03-08 NOTE — Progress Notes (Signed)
Called to room to assist during endoscopic procedure.  Patient ID and intended procedure confirmed with present staff. Received instructions for my participation in the procedure from the performing physician.  

## 2023-03-08 NOTE — Op Note (Signed)
[facility] Patient Name: Kelly [name] Procedure Date: 03/08/2023 9:54 AM MRN: [id number] Endoscopist: Wilhemina Bonito. Marina Goodell , MD, [id number] Age: 78 Referring MD:  Date of Birth: [birth year] Gender: Female Account #: [id number] Procedure:                Colonoscopy with cold snare polypectomy x 1; with                            biopsies Indications:              High risk colon cancer surveillance: Personal                            history of multiple (3 or more) adenomas. Previous                            examinations 2012, 2022 (8 polyps) Medicines:                Monitored Anesthesia Care Procedure:                Pre-Anesthesia Assessment:                           - Prior to the procedure, a History and Physical                            was performed, and patient medications and                            allergies were reviewed. The patient's tolerance of                            previous anesthesia was also reviewed. The risks                            and benefits of the procedure and the sedation                            options and risks were discussed with the patient.                            All questions were answered, and informed consent                            was obtained. Prior Anticoagulants: The patient has                            taken no anticoagulant or antiplatelet agents. ASA                            Grade Assessment: II - A patient with mild systemic                            disease. After reviewing the risks and benefits,  the patient was deemed in satisfactory condition to                            undergo the procedure.                           After obtaining informed consent, the colonoscope                            was passed under direct vision. Throughout the                            procedure, the patient's blood pressure, pulse, and                            oxygen saturations were  monitored continuously. The                            Olympus Scope SN: [id number] was introduced through                            the anus and advanced to the the cecum, identified                            by appendiceal orifice and ileocecal valve. The                            ileocecal valve, appendiceal orifice, and rectum                            were photographed. The quality of the bowel                            preparation was good only after vigorous irrigation                            and suctioning. The colonoscopy was performed                            without difficulty. The patient tolerated the                            procedure well. The bowel preparation used was                            SUPREP via split dose instruction. Scope In: 10:00:19 AM Scope Out: 10:25:51 AM Scope Withdrawal Time: 0 hours 12 minutes 58 seconds  Total Procedure Duration: 0 hours 25 minutes 32 seconds  Findings:                 A 4 mm polyp was found in the ascending colon. The                            polyp was removed with a cold snare. Resection  and                            retrieval were complete.                           Diverticula were found in the sigmoid colon and                            ascending colon. There were changes of melanosis                            coli. Biopsies taken.                           The exam was otherwise without abnormality on                            direct and retroflexion views.:. The colon is                            redundant Complications:            No immediate complications. Estimated blood loss:                            None. Estimated Blood Loss:     Estimated blood loss: none. Impression:               - One 4 mm polyp in the ascending colon, removed                            with a cold snare. Resected and retrieved.                           - Diverticulosis in the sigmoid colon and in the                             ascending colon. Melanosis coli.                           - The examination was otherwise normal on direct                            and retroflexion views. Redundant colon. Recommendation:           - Repeat colonoscopy is not recommended for                            surveillance.                           - Patient has a contact number available for                            emergencies. The signs and symptoms of potential  delayed complications were discussed with the                            patient. Return to normal activities tomorrow.                            Written discharge instructions were provided to the                            patient.                           - Resume previous diet.                           - Continue present medications.                           - Await pathology results. Wilhemina Bonito. Marina Goodell, MD 03/08/2023 10:35:02 AM This report has been signed electronically.

## 2023-03-08 NOTE — Progress Notes (Signed)
Sedate, gd SR, tolerated procedure well, VSS, report to RN 

## 2023-03-09 ENCOUNTER — Telehealth: Payer: Self-pay | Admitting: *Deleted

## 2023-03-09 NOTE — Telephone Encounter (Signed)
  Follow up Call-     03/08/2023    8:10 AM  Call back number  Post procedure Call Back phone  # [phone]  Permission to leave phone message Yes     Patient questions:  Do you have a fever, pain , or abdominal swelling? No. Pain Score  0 *  Have you tolerated food without any problems? Yes.    Have you been able to return to your normal activities? Yes.    Do you have any questions about your discharge instructions: Diet   No. Medications  No. Follow up visit  No.  Do you have questions or concerns about your Care? No.  Actions: * If pain score is 4 or above: No action needed, pain <4.

## 2023-03-12 ENCOUNTER — Ambulatory Visit (INDEPENDENT_AMBULATORY_CARE_PROVIDER_SITE_OTHER): Payer: Medicare Other | Admitting: Physician Assistant

## 2023-03-12 ENCOUNTER — Encounter: Payer: Self-pay | Admitting: Physician Assistant

## 2023-03-12 ENCOUNTER — Encounter: Payer: Self-pay | Admitting: Internal Medicine

## 2023-03-12 DIAGNOSIS — M1611 Unilateral primary osteoarthritis, right hip: Secondary | ICD-10-CM

## 2023-03-12 LAB — SURGICAL PATHOLOGY

## 2023-03-12 NOTE — Progress Notes (Signed)
HPI: Kelly [name] returns today status post right hip intra-articular injection by Dr. Shon Baton on 02/12/2023.  She states that she got good relief from the injection in the first 24 hours but then after that no pain relief.  She notes that she is able to deal with the pain.  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  She states she is able to donning shoes and socks.  Review of systems: See HPI otherwise negative  Physical exam: Lower extremities: Ambulates without any assistive device.  Right hip no pain with external rotation.  Discomfort with internal rotation.  Overall good range of motion of the right hip without any blocks.  Impression: Right hip osteoarthritis  Plan: At this point in time patient is not interested in any further treatment.  She would like to see how she does with the hip pain.  Therefore she will follow-up with [place] as needed.

## 2023-03-20 ENCOUNTER — Telehealth: Payer: Self-pay | Admitting: Orthopaedic Surgery

## 2023-03-20 NOTE — Telephone Encounter (Signed)
Patient would like to go ahead with right hip surgery

## 2023-03-26 DIAGNOSIS — N3281 Overactive bladder: Secondary | ICD-10-CM | POA: Diagnosis not present

## 2023-03-30 ENCOUNTER — Telehealth: Payer: Self-pay

## 2023-03-30 NOTE — Telephone Encounter (Signed)
-----   Message from Nurse Carlyle C sent at 03/02/2023  8:42 AM EST ----- Regarding: FW: bp log  ----- Message ----- From: Luellen Pucker, RN Sent: 03/02/2023  12:00 AM EST To: [facility] Triage Subject: bp log                                         Bp log due

## 2023-03-30 NOTE — Telephone Encounter (Signed)
Call to patient to check on BP log. No answer. Left detailed message asking patient to submit bp log by Mychart or call our main # and give to operators. Second notice, letter sent.

## 2023-04-09 ENCOUNTER — Telehealth: Payer: Self-pay | Admitting: Cardiovascular Disease

## 2023-04-09 NOTE — Telephone Encounter (Signed)
 Patient called to report her BP readings as requested:  1/27 - 143/67  HR 63 1/28 - 152/72  HR 72 1/29 - 142/67  HR 66 1/30 - 135/76  HR 72 1/31 - 141/80  HR 61 2/1 -    113/62  HR 68 2/2 -    127/67  HR 70 2/3 -    136/88  HR 71 2/4 -    107/72  HR 73 2/5 -    129/73  HR 70 2/6 -    131/78  HR 66 2/7 -    128/68  HR 62 2/8 -    127/74  HR 67 2/9 -    159/83  HR 72 (1:50 pm)            137/68  HR 62 (10:52 pm) 2/10 -  132/75  HR 64 2/11 -  127/73  HR 76 2/12 -  142/69  HR 61 2/13 -  135/77  HR 67 2/14 -  115/64   HR 69 2/15 -  149/103  HR 91 2/16 -  128/72  HR 66 2/17 -  153/91  HR 64 2/18 -  135/92  HR 65 2/19 -  123/68  HR 67 2/20 -  138/83  HR 63 2/21 -  135/77  HR 67 2/22 -  128/72  HR 61 2/23 -  131/66  HR 64 2/24 -  108/79  HR 81

## 2023-04-09 NOTE — Telephone Encounter (Signed)
Spoke with the patient and gave recommendations from Ernest. Patient verbalized understanding.  

## 2023-04-10 ENCOUNTER — Ambulatory Visit: Payer: Medicare Other | Attending: Cardiology | Admitting: Pharmacist

## 2023-04-10 DIAGNOSIS — E785 Hyperlipidemia, unspecified: Secondary | ICD-10-CM | POA: Diagnosis not present

## 2023-04-10 NOTE — Patient Instructions (Signed)
 Continue rosuvastatin 20mg  daily  I will submit a prior authorization for Repatha. I will call you once I hear back. Please call me at [phone] with any questions.   Repatha is a cholesterol medication that improved your body's ability to get rid of "bad cholesterol" known as LDL. It can lower your LDL up to 60%! It is an injection that is given under the skin every 2 weeks. The medication often requires a prior authorization from your insurance company. We will take care of submitting all the necessary information to your insurance company to get it approved. The most common side effects of Repatha include runny nose, symptoms of the common cold, rarely flu or flu-like symptoms, back/muscle pain in about 3-4% of the patients, and redness, pain, or bruising at the injection site. Tell your healthcare provider if you have any side effect that bothers you or that does not go away.

## 2023-04-10 NOTE — Assessment & Plan Note (Signed)
 Assessment : LDL-C above goal less than 70 due to coronary calcium score of 1112 Patient on rosuvastatin 20 mg daily Has been very active her whole life Discussed PCSK9 its effect on LP(a), his ability to lower LDL cholesterol, cardiovascular risk reduction, side effects, cost and injection technique. Will need to apply for [employer] grant  Plan: Submit prior authorization for Repatha Continue rosuvastatin 20 mg daily Will need to apply for [employer] grant as well

## 2023-04-10 NOTE — Progress Notes (Signed)
 Patient ID: Kelly [name]                 DOB: Feb 28, 1945                    MRN: [id number]      HPI: Kelly [name] is a 78 y.o. female patient referred to lipid clinic by Robin Searing. PMH is significant for coronary artery disease, HTN, HLD CKD, arthritis.  CTA results were abnormal with elevated calcium score of 1112 and calcified plaque noted in the mid LAD with moderate stenosis (50-69%) and mild stenosis noted in RCA and left circumflex (25-49%).  Results were submitted for FFR which showed no significant stenosis.  2D echo was also completed and showed normal EF of 60-65% with no RWMA and mild MVR and normal LA/RA. LP(a) 283. In January her rosuvastatin was increased to 20mg  daily.  Patient presents today to lipid clinic.  No complaints with increased dose of rosuvastatin.  We discussed PCSK9, its effect on LP(a), his ability to lower LDL cholesterol, cardiovascular risk reduction, side effects, cost and injection technique.  She was previously doing water aerobics but the cold water hurt her hip.  Going for a hip replacement soon and plans to resume in April.  Current Medications: rosuvastatin 20mg  daily Intolerances: none Risk Factors: CAD, HTN, age LDL-C goal: <70 ApoB goal: <80  Diet:  Breakfast: banana oatmeal and coffee (honey and almond milk) Smoothies w/ protein powder Fruit Chicken and fish, rare red meat and processed meat Drink: water, pomegrant juice  Exercise: water aerobics   Family History:  Family History  Problem Relation Age of Onset   Heart disease Mother    Alcohol abuse Father    Hypertension Father    Cancer Sister    Hypertension Sister    Breast cancer Neg Hx    Colon cancer Neg Hx    Colon polyps Neg Hx    Esophageal cancer Neg Hx    Stomach cancer Neg Hx    Rectal cancer Neg Hx      Social History: no tobacco, no ETOH (previously 1-2 wine per night)  Labs: Lipid Panel     Component Value Date/Time   CHOL 180 12/08/2022 0932    TRIG 162 (H) 12/08/2022 0932   HDL 37 (L) 12/08/2022 0932   CHOLHDL 4.9 12/08/2022 0932   VLDL 43 (H) 02/09/2016 1014   LDLCALC 115 (H) 12/08/2022 0932    Past Medical History:  Diagnosis Date   Arthritis    Cataract    Chronic kidney disease    Hyperlipidemia    Hypertension    Osteoporosis    Shortness of breath    With activity    Current Outpatient Medications on File Prior to Visit  Medication Sig Dispense Refill   amLODipine (NORVASC) 10 MG tablet Take 1 tablet (10 mg total) by mouth daily. 90 tablet 3   aspirin EC 81 MG tablet Take 1 tablet (81 mg total) by mouth daily. Swallow whole. 90 tablet 3   Multiple Vitamins-Minerals (WOMENS 50+ MULTI VITAMIN PO) Take 1 tablet by mouth daily at 6 (six) AM.     rosuvastatin (CRESTOR) 20 MG tablet Take 1 tablet (20 mg total) by mouth daily. 90 tablet 3   No current facility-administered medications on file prior to visit.    No Known Allergies  Assessment/Plan:  1. Hyperlipidemia -  Hyperlipidemia Assessment : LDL-C above goal less than 70 due to coronary calcium score of  1112 Patient on rosuvastatin 20 mg daily Has been very active her whole life Discussed PCSK9 its effect on LP(a), his ability to lower LDL cholesterol, cardiovascular risk reduction, side effects, cost and injection technique. Will need to apply for healthwell grant  Plan: Submit prior authorization for Repatha Continue rosuvastatin 20 mg daily Will need to apply for healthwell grant as well    Thank you,  Olene Floss, Pharm.D, BCACP, CPP [facility] of [facility] [address] [place], [place] [address]  Phone: [phone]; Fax: [phone]

## 2023-04-11 ENCOUNTER — Encounter: Payer: Self-pay | Admitting: Pharmacy Technician

## 2023-04-11 ENCOUNTER — Other Ambulatory Visit (HOSPITAL_COMMUNITY): Payer: Self-pay

## 2023-04-11 ENCOUNTER — Telehealth: Payer: Self-pay | Admitting: Pharmacy Technician

## 2023-04-11 NOTE — Telephone Encounter (Signed)
 Pharmacy Patient Advocate Encounter   Received notification from [facility] charts that prior authorization for Repatha is required/requested.   Insurance verification completed.   The patient is insured through [employer] .   Per test claim: PA required; PA submitted to above mentioned insurance via CoverMyMeds Key/confirmation #/EOC [id number] Status is pending

## 2023-04-11 NOTE — Telephone Encounter (Signed)
-----   Message from Olene Floss sent at 04/10/2023  4:02 PM EST ----- Please do prior authorization for Repatha -ASCVD Please also get healthwell grant

## 2023-04-11 NOTE — Telephone Encounter (Signed)
 Patient Advocate Encounter   The patient was approved for a [employer] grant that will help cover the cost of repatha Total amount awarded, 2500.00.  Effective: 03/12/23 - 03/10/24   HQI:[id number] MWU:[id number]   Pharmacy provided with approval and processing information. Patient informed via mychart

## 2023-04-11 NOTE — Telephone Encounter (Signed)
 Pharmacy Patient Advocate Encounter  Received notification from [facility] that Prior Authorization for Repatha has been APPROVED from 04/11/23 to 04/10/24. Ran test claim, Copay is $35.00- one month. This test claim was processed through [facility]- copay amounts may vary at other pharmacies due to pharmacy/plan contracts, or as the patient moves through the different stages of their insurance plan.   PA #/Case ID/Reference #: [id number]

## 2023-04-12 MED ORDER — REPATHA SURECLICK 140 MG/ML ~~LOC~~ SOAJ: 1.0000 mL | SUBCUTANEOUS | 3 refills | Status: DC

## 2023-04-12 NOTE — Telephone Encounter (Signed)
 Called and left a detailed message per DPR that medication was approved along with grant. Info called into [facility] but this was prior to me sending Rx so pt should make sure [facility] billed grant as well.

## 2023-04-16 ENCOUNTER — Telehealth: Payer: Self-pay | Admitting: Orthopaedic Surgery

## 2023-04-16 NOTE — Telephone Encounter (Signed)
 Pt is requesting a call back, pt didn't not provide what the call is in regards of

## 2023-04-18 ENCOUNTER — Telehealth: Payer: Self-pay

## 2023-04-18 NOTE — Telephone Encounter (Signed)
 Received voice mail from patient for return call to discuss possible date for surgery.  I called patient and left voice mail for return call.

## 2023-04-30 ENCOUNTER — Ambulatory Visit (INDEPENDENT_AMBULATORY_CARE_PROVIDER_SITE_OTHER): Payer: Federal, State, Local not specified - PPO | Admitting: Orthopaedic Surgery

## 2023-04-30 DIAGNOSIS — M1611 Unilateral primary osteoarthritis, right hip: Secondary | ICD-10-CM

## 2023-04-30 DIAGNOSIS — M25551 Pain in right hip: Secondary | ICD-10-CM | POA: Diagnosis not present

## 2023-04-30 NOTE — Progress Notes (Signed)
 The patient is a regular patient of ours.  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  She has been hurting for over a year now and is really getting worse.  Her pain is now definitely in the groin.  She has had a trochanteric steroid injection of the right hip and an intra-articular steroid injection in the right hip.  Those only helped temporize her pain.  At this point her right hip pain is daily and it is detrimentally affecting her mobility, her quality of life and her actives of daily living.  She has had a history of bilateral knee replacements.  On exam her right hip she has significant pain with internal and external rotation with pain in the groin.  There is only mild pain over the trochanteric area.  Previous x-rays of her pelvis and right hip show significant joint space narrowing of the right hip when comparing the right and left hips.  The left hip space is normal and well-maintained and her left hip exam is normal.  We had a long and thorough discussion about hip replacement surgery.  We described the risks and benefits of the surgery and what to expect from an intraoperative and postoperative standpoint.  I went over her x-rays with her as well as gave her handout about hip replacement surgery.  I reviewed all of her notes within epic as well as past medical history and medications and labs.  All questions and concerns were answered and addressed.  Will work on getting on the schedule in the near future for a right total hip arthroplasty.

## 2023-05-16 NOTE — Progress Notes (Signed)
 Left message requesting surgical orders with Sherrie at Dr. Eliberto Ivory office.

## 2023-05-16 NOTE — Progress Notes (Signed)
 Surgical Instructions   Your procedure is scheduled on Tuesday, April 15, 25. Report to [facility] Main Entrance "A" at 5:30 A.M., then check in with the Admitting office. Any questions or running late day of surgery: call [phone]  Questions prior to your surgery date: call [phone], Monday-Friday, 8am-4pm. If you experience any cold or flu symptoms such as cough, fever, chills, shortness of breath, etc. between now and your scheduled surgery, please notify us at the above number.     Remember:  Do not eat after midnight the night before your surgery   You may drink clear liquids until 4:30 the morning of your surgery.   Clear liquids allowed are: Water, Non-Citrus Juices (without pulp), Carbonated Beverages, Clear Tea (no milk, honey, etc.), Black Coffee Only (NO MILK, CREAM OR POWDERED CREAMER of any kind), and Gatorade.    Take these medicines the morning of surgery with A SIP OF WATER  amLODipine (NORVASC)  rosuvastatin (CRESTOR)   Patient Instructions  The night before surgery:  No food after midnight. ONLY clear liquids after midnight  The day of surgery:  Drink ONE (1) Pre-Surgery Clear Ensure by 4:30 the morning of surgery. Drink in one sitting. Do not sip.  This drink was given to you during your hospital pre-op appointment visit.  Nothing else to drink after completing the Pre-Surgery Clear Ensure.          If you have questions, please contact your surgeon's office.    Follow your surgeon's instructions on when to stop Asprin.  If no instructions were given by your surgeon then you will need to call the office to get those instructions.    One week prior to surgery, STOP taking any Aleve, Naproxen, Ibuprofen, Motrin, Advil, Goody's, BC's, all herbal medications, fish oil, and non-prescription vitamins.                     Do NOT Smoke (Tobacco/Vaping) for 24 hours prior to your procedure.  If you use a CPAP at night, you may bring your mask/headgear  for your overnight stay.   You will be asked to remove any contacts, glasses, piercing's, hearing aid's, dentures/partials prior to surgery. Please bring cases for these items if needed.    Patients discharged the day of surgery will not be allowed to drive home, and someone needs to stay with them for 24 hours.  SURGICAL WAITING ROOM VISITATION Patients may have no more than 2 support people in the waiting area - these visitors may rotate.   Pre-op nurse will coordinate an appropriate time for 1 ADULT support person, who may not rotate, to accompany patient in pre-op.  Children under the age of 62 must have an adult with them who is not the patient and must remain in the main waiting area with an adult.  If the patient needs to stay at the hospital during part of their recovery, the visitor guidelines for inpatient rooms apply.  Please refer to the [facility] website for the visitor guidelines for any additional information.   If you received a COVID test during your pre-op visit  it is requested that you wear a mask when out in public, stay away from anyone that may not be feeling well and notify your surgeon if you develop symptoms. If you have been in contact with anyone that has tested positive in the last 10 days please notify you surgeon.      Pre-operative CHG Bathing Instructions   You  can play a key role in reducing the risk of infection after surgery. Your skin needs to be as free of germs as possible. You can reduce the number of germs on your skin by washing with CHG (chlorhexidine gluconate) soap before surgery. CHG is an antiseptic soap that kills germs and continues to kill germs even after washing.   DO NOT use if you have an allergy to chlorhexidine/CHG or antibacterial soaps. If your skin becomes reddened or irritated, stop using the CHG and notify one of our RNs at [phone].              TAKE A SHOWER THE NIGHT BEFORE SURGERY AND THE DAY OF SURGERY    Please keep  in mind the following:  DO NOT shave, including legs and underarms, 48 hours prior to surgery.   You may shave your face before/day of surgery.  Place clean sheets on your bed the night before surgery Use a clean washcloth (not used since being washed) for each shower. DO NOT sleep with pet's night before surgery.  CHG Shower Instructions:  Wash your face and private area with normal soap. If you choose to wash your hair, wash first with your normal shampoo.  After you use shampoo/soap, rinse your hair and body thoroughly to remove shampoo/soap residue.  Turn the water OFF and apply half the bottle of CHG soap to a CLEAN washcloth.  Apply CHG soap ONLY FROM YOUR NECK DOWN TO YOUR TOES (washing for 3-5 minutes)  DO NOT use CHG soap on face, private areas, open wounds, or sores.  Pay special attention to the area where your surgery is being performed.  If you are having back surgery, having someone wash your back for you may be helpful. Wait 2 minutes after CHG soap is applied, then you may rinse off the CHG soap.  Pat dry with a clean towel  Put on clean pajamas    Additional instructions for the day of surgery: DO NOT APPLY any lotions, deodorants, cologne, or perfumes.   Do not wear jewelry or makeup Do not wear nail polish, gel polish, artificial nails, or any other type of covering on natural nails (fingers and toes) Do not bring valuables to the hospital. [facility] is not responsible for valuables/personal belongings. Put on clean/comfortable clothes.  Please brush your teeth.  Ask your nurse before applying any prescription medications to the skin.

## 2023-05-17 ENCOUNTER — Telehealth: Payer: Self-pay | Admitting: Orthopaedic Surgery

## 2023-05-17 ENCOUNTER — Encounter (HOSPITAL_COMMUNITY)
Admission: RE | Admit: 2023-05-17 | Discharge: 2023-05-17 | Disposition: A | Discharge status: Home / Self Care | Source: Ambulatory Visit | Attending: Orthopaedic Surgery | Admitting: Orthopaedic Surgery

## 2023-05-17 ENCOUNTER — Encounter (HOSPITAL_COMMUNITY): Payer: Self-pay

## 2023-05-17 ENCOUNTER — Other Ambulatory Visit: Payer: Self-pay

## 2023-05-17 VITALS — BP 131/90 | HR 70 | Temp 97.8°F | Resp 17 | Ht <= 58 in | Wt 188.9 lb

## 2023-05-17 DIAGNOSIS — N183 Chronic kidney disease, stage 3 unspecified: Secondary | ICD-10-CM | POA: Diagnosis not present

## 2023-05-17 DIAGNOSIS — I129 Hypertensive chronic kidney disease with stage 1 through stage 4 chronic kidney disease, or unspecified chronic kidney disease: Secondary | ICD-10-CM | POA: Insufficient documentation

## 2023-05-17 DIAGNOSIS — E785 Hyperlipidemia, unspecified: Secondary | ICD-10-CM | POA: Diagnosis not present

## 2023-05-17 DIAGNOSIS — M1611 Unilateral primary osteoarthritis, right hip: Secondary | ICD-10-CM | POA: Insufficient documentation

## 2023-05-17 DIAGNOSIS — I340 Nonrheumatic mitral (valve) insufficiency: Secondary | ICD-10-CM | POA: Diagnosis not present

## 2023-05-17 DIAGNOSIS — Z01812 Encounter for preprocedural laboratory examination: Secondary | ICD-10-CM | POA: Insufficient documentation

## 2023-05-17 DIAGNOSIS — I2510 Atherosclerotic heart disease of native coronary artery without angina pectoris: Secondary | ICD-10-CM | POA: Diagnosis not present

## 2023-05-17 DIAGNOSIS — Z01818 Encounter for other preprocedural examination: Secondary | ICD-10-CM | POA: Diagnosis present

## 2023-05-17 LAB — BASIC METABOLIC PANEL WITH GFR
Anion gap: 9 (ref 5–15)
BUN: 21 mg/dL (ref 8–23)
CO2: 28 mmol/L (ref 22–32)
Calcium: 10.3 mg/dL (ref 8.9–10.3)
Chloride: 104 mmol/L (ref 98–111)
Creatinine, Ser: 1.5 mg/dL — ABNORMAL HIGH (ref 0.44–1.00)
GFR, Estimated: 35 mL/min — ABNORMAL LOW (ref >60)
Glucose, Bld: 89 mg/dL (ref 70–99)
Potassium: 4 mmol/L (ref 3.5–5.1)
Sodium: 141 mmol/L (ref 135–145)

## 2023-05-17 LAB — CBC
HCT: 40.2 % (ref 36.0–46.0)
Hemoglobin: 12.8 g/dL (ref 12.0–15.0)
MCH: 29.1 pg (ref 26.0–34.0)
MCHC: 31.8 g/dL (ref 30.0–36.0)
MCV: 91.4 fL (ref 80.0–100.0)
Platelets: 216 10*3/uL (ref 150–400)
RBC: 4.4 MIL/uL (ref 3.87–5.11)
RDW: 14.2 % (ref 11.5–15.5)
WBC: 4.8 10*3/uL (ref 4.0–10.5)
nRBC: 0 % (ref 0.0–0.2)

## 2023-05-17 LAB — TYPE AND SCREEN
ABO/RH(D): O POS
Antibody Screen: NEGATIVE

## 2023-05-17 LAB — SURGICAL PCR SCREEN
MRSA, PCR: NEGATIVE
Staphylococcus aureus: NEGATIVE

## 2023-05-17 NOTE — Progress Notes (Signed)
 PCP - Dr. Marlan Palau Cardiologist - Dr. Robin Searing - LOV 02/16/23 cardiac clearance with F/U in 6 months  PPM/ICD - Denies Device Orders - n/a Rep Notified - n/a  Chest x-ray - 12-14-22 EKG - 12-14-22 Stress Test - Denies ECHO - 02-08-23 Cardiac Cath - Denies  Sleep Study - Denies CPAP - none  NON-Diabetic  Last dose of GLP1 agonist-  Denies GLP1 instructions: n/a  Blood Thinner Instructions: Denies Aspirin Instructions: Per patient instructed to stop 7 day's prior  ERAS Protcol - Clears until 04:30 PRE-SURGERY Ensure or G2- Ensure   COVID TEST- None   Anesthesia review: Yes, cardiac clearance, HTN, CKD  Patient denies shortness of breath, fever, cough and chest pain at PAT appointment. Patient denies any respiratory issues at this time.    All instructions explained to the patient, with a verbal understanding of the material. Patient agrees to go over the instructions while at home for a better understanding. Patient also instructed to self quarantine after being tested for COVID-19. The opportunity to ask questions was provided.

## 2023-05-17 NOTE — Progress Notes (Signed)
 Anesthesia Chart Review:  78 year old female follows with cardiology for history of HTN, HLD, CAD by coronary CTA 01/2023 (FFR analysis showed no obstructive lesions).  Echo 01/2023 showed EF 60 to 65%, normal RV function, mild mitral regurgitation.  Seen by Robin Searing, NP on 02/16/2023 for preop evaluation.  Per note, "-Patient's RCRI score is 0.4% -The patient affirms she has been doing well without any new cardiac symptoms. They are able to achieve 5 METS without cardiac limitations. Therefore, based on ACC/AHA guidelines, the patient would be at acceptable risk for the planned procedure without further cardiovascular testing. The patient was advised that if she develops new symptoms prior to surgery to contact our office to arrange for a follow-up visit, and she verbalized understanding."  History of CKD 3 by labs.  Preop labs reviewed, creatinine mildly elevated 1.50 (peers to be near baseline), otherwise unremarkable.  EKG 12/14/2022: NSR.  Rate 75.  TTE 02/08/2023:  1. Left ventricular ejection fraction, by estimation, is 60 to 65%. The  left ventricle has normal function. The left ventricle has no regional  wall motion abnormalities. Left ventricular diastolic parameters are  indeterminate.   2. Right ventricular systolic function is normal. The right ventricular  size is normal.   3. Mild mitral valve regurgitation.   4. The aortic valve is tricuspid. Aortic valve regurgitation is not  visualized. Aortic valve sclerosis/calcification is present, without any  evidence of aortic stenosis.   5. The inferior vena cava is dilated in size with <50% respiratory  variability, suggesting right atrial pressure of 15 mmHg.   Coronary CTA with FFR analysis 02/05/2023: 1. Left Main:  No significant stenosis.   2. LAD: No significant stenosis.  FFRct 0.91 in mid segment. 3. LCX: No significant stenosis.  FFRct 0.98 4. RCA: No significant stenosis.  FFRct 0.98   IMPRESSION: 1.  CT FFR  analysis didn't show any significant stenosis.     Kelly [name] [facility]/Anesthesiology Phone [phone] 05/17/2023 1:24 PM

## 2023-05-17 NOTE — Telephone Encounter (Signed)
 Patient called and wants to know if she approved before the surgery. She also has to be in a nursing home ( [facility] in [place]). CB#[phone]

## 2023-05-17 NOTE — Anesthesia Preprocedure Evaluation (Addendum)
 Anesthesia Evaluation  Patient identified by MRN, date of birth, ID band Patient awake    Reviewed: Allergy & Precautions, H&P , NPO status , Patient's Chart, lab work & pertinent test results  Airway Mallampati: II  TM Distance: >3 FB Neck ROM: Full    Dental no notable dental hx.    Pulmonary neg pulmonary ROS   Pulmonary exam normal breath sounds clear to auscultation       Cardiovascular hypertension, + CAD  Normal cardiovascular exam Rhythm:Regular Rate:Normal     Neuro/Psych  PSYCHIATRIC DISORDERS Anxiety Depression    negative neurological ROS  negative psych ROS   GI/Hepatic negative GI ROS, Neg liver ROS,,,  Endo/Other  diabetes, Type 2    Renal/GU CRFRenal disease  negative genitourinary   Musculoskeletal  (+) Arthritis ,    Abdominal   Peds negative pediatric ROS (+)  Hematology  (+) Blood dyscrasia, anemia   Anesthesia Other Findings Plt 216  Reproductive/Obstetrics negative OB ROS                              Anesthesia Physical Anesthesia Plan  ASA: 3  Anesthesia Plan: Spinal   Post-op Pain Management: Ofirmev IV (intra-op)*   Induction: Intravenous  PONV Risk Score and Plan: 2 and Treatment may vary due to age or medical condition  Airway Management Planned: Natural Airway  Additional Equipment:   Intra-op Plan:   Post-operative Plan: Extubation in OR  Informed Consent: I have reviewed the patients History and Physical, chart, labs and discussed the procedure including the risks, benefits and alternatives for the proposed anesthesia with the patient or authorized representative who has indicated his/her understanding and acceptance.     Dental advisory given  Plan Discussed with: CRNA  Anesthesia Plan Comments: (PAT note by Rudy Costain, PA-C: 78 year old female follows with cardiology for history of HTN, HLD, CAD by coronary CTA 01/2023 (FFR analysis  showed no obstructive lesions).  Echo 01/2023 showed EF 60 to 65%, normal RV function, mild mitral regurgitation.  Seen by Charles Connor, NP on 02/16/2023 for preop evaluation.  Per note, "-Patient's RCRI score is 0.4% -The patient affirms she has been doing well without any new cardiac symptoms. They are able to achieve 5 METS without cardiac limitations. Therefore, based on ACC/AHA guidelines, the patient would be at acceptable risk for the planned procedure without further cardiovascular testing. The patient was advised that if she develops new symptoms prior to surgery to contact our office to arrange for a follow-up visit, and she verbalized understanding."  History of CKD 3 by labs.  Preop labs reviewed, creatinine mildly elevated 1.50 (peers to be near baseline), otherwise unremarkable.  EKG 12/14/2022: NSR.  Rate 75.  TTE 02/08/2023:  1. Left ventricular ejection fraction, by estimation, is 60 to 65%. The  left ventricle has normal function. The left ventricle has no regional  wall motion abnormalities. Left ventricular diastolic parameters are  indeterminate.   2. Right ventricular systolic function is normal. The right ventricular  size is normal.   3. Mild mitral valve regurgitation.   4. The aortic valve is tricuspid. Aortic valve regurgitation is not  visualized. Aortic valve sclerosis/calcification is present, without any  evidence of aortic stenosis.   5. The inferior vena cava is dilated in size with <50% respiratory  variability, suggesting right atrial pressure of 15 mmHg.   Coronary CTA with FFR analysis 02/05/2023: 1. Left Main:  No significant stenosis.  2. LAD: No significant stenosis.  FFRct 0.91 in mid segment. 3. LCX: No significant stenosis.  FFRct 0.98 4. RCA: No significant stenosis.  FFRct 0.98   IMPRESSION: 1.  CT FFR analysis didn't show any significant stenosis.   )         Anesthesia Quick Evaluation

## 2023-05-21 ENCOUNTER — Ambulatory Visit (INDEPENDENT_AMBULATORY_CARE_PROVIDER_SITE_OTHER)

## 2023-05-21 VITALS — Ht 60.0 in | Wt 192.0 lb

## 2023-05-21 DIAGNOSIS — M25551 Pain in right hip: Secondary | ICD-10-CM

## 2023-05-21 DIAGNOSIS — M1611 Unilateral primary osteoarthritis, right hip: Secondary | ICD-10-CM

## 2023-05-22 ENCOUNTER — Encounter: Payer: Self-pay | Admitting: Orthopaedic Surgery

## 2023-05-22 NOTE — Progress Notes (Signed)
 The patient was seen by nursing staff and case manager only for a weight and BMI calculation.

## 2023-05-28 ENCOUNTER — Telehealth: Payer: Self-pay | Admitting: *Deleted

## 2023-05-28 DIAGNOSIS — M1611 Unilateral primary osteoarthritis, right hip: Secondary | ICD-10-CM | POA: Insufficient documentation

## 2023-05-28 NOTE — H&P (Signed)
 TOTAL HIP ADMISSION H&P  Patient is admitted for right total hip arthroplasty.  Subjective:  Chief Complaint: right hip pain  HPI: Kelly [name], 78 y.o. female, has a history of pain and functional disability in the right hip(s) due to arthritis and patient has failed non-surgical conservative treatments for greater than 12 weeks to include NSAID's and/or analgesics, corticosteriod injections, flexibility and strengthening excercises, use of assistive devices, weight reduction as appropriate, and activity modification.  Onset of symptoms was gradual starting 1 years ago with gradually worsening course since that time.The patient noted no past surgery on the right hip(s).  Patient currently rates pain in the right hip at 10 out of 10 with activity. Patient has night pain, worsening of pain with activity and weight bearing, trendelenberg gait, pain that interfers with activities of daily living, and pain with passive range of motion. Patient has evidence of subchondral sclerosis, periarticular osteophytes, and joint space narrowing by imaging studies. This condition presents safety issues increasing the risk of falls.  There is no current active infection.  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) 02/09/2022   Status post revision of total knee, right 12/09/2021   Arthritis of right knee 12/08/2021   Chronic left-sided low back pain with left-sided sciatica 12/17/2019   Type 2 diabetes mellitus with obesity (HCC) 12/13/2019   Chronic rhinitis 03/03/2019   Constipation 10/31/2016   Impaired glucose tolerance 10/31/2016   Stage III chronic kidney disease (HCC) 10/31/2016   Urge urinary incontinence 10/31/2016   Status post right partial knee replacement 10/02/2014   Anxiety and depression 11/06/2011   Hypertension 10/13/2010   Hyperlipidemia 10/13/2010    Osteoarthritis 10/13/2010   UNSPECIFIED IRON DEFICIENCY ANEMIA 04/11/2010   DIARRHEA 04/11/2010   Past Medical History:  Diagnosis Date   Arthritis    Cataract    Chronic kidney disease    Hyperlipidemia    Hypertension    Osteoporosis    Shortness of breath    With activity    Past Surgical History:  Procedure Laterality Date   ABDOMINAL HYSTERECTOMY     BREAST REDUCTION SURGERY     and lift   BREAST REDUCTION SURGERY Bilateral    CARPAL TUNNEL RELEASE  06/2009   right   CATARACT EXTRACTION     CHOLECYSTECTOMY     FACIAL COSMETIC SURGERY  03/2008   KNEE ARTHROSCOPY  2007   left & right   KNEE CLOSED REDUCTION Right 02/09/2022   Procedure: CLOSED MANIPULATION RIGHT TOTAL KNEE ARTHROPLASTY;  Surgeon: Arnie Lao, MD;  Location: [facility] OR;  Service: Orthopedics;  Laterality: Right;   LIPOSUCTION EXTREMITIES     thighs   PARTIAL KNEE ARTHROPLASTY Right 10/02/2014   Procedure: RIGHT KNEE MEDIAL UNICOMPARTMENTAL ARTHROPLASTY;  Surgeon: Arnie Lao, MD;  Location: [facility]S;  Service: Orthopedics;  Laterality: Right;   REDUCTION MAMMAPLASTY     bilateral   TOTAL KNEE REVISION  10/12/2011   Procedure: TOTAL KNEE REVISION;  Surgeon: Arnie Lao, MD;  Location: [facility];  Service: Orthopedics;  Laterality: Left;  Left Total Knee Revision Arthroplasty   TOTAL KNEE REVISION Right 12/09/2021   Procedure: CONVERT RIGHT UNI KNEE ARTHROPLASTY TO TOTAL KNEE ARTHROPLASTY;  Surgeon: Arnie Lao, MD;  Location: [facility];  Service: Orthopedics;  Laterality: Right;   TRIGGER FINGER RELEASE  06/2009   right 4th finger    No  current facility-administered medications for this encounter.   Current Outpatient Medications  Medication Sig Dispense Refill Last Dose/Taking   amLODipine (NORVASC) 10 MG tablet Take 1 tablet (10 mg total) by mouth daily. 90 tablet 3 Taking   aspirin EC 81 MG tablet Take 1 tablet (81 mg total) by mouth daily. Swallow whole. 90 tablet  3 Taking   Evolocumab (REPATHA SURECLICK) 140 MG/ML SOAJ Inject 140 mg into the skin every 14 (fourteen) days. 6 mL 3 Taking   Multiple Vitamins-Minerals (WOMENS 50+ MULTI VITAMIN PO) Take 1 tablet by mouth daily at 6 (six) AM.   Taking   rosuvastatin (CRESTOR) 20 MG tablet Take 1 tablet (20 mg total) by mouth daily. 90 tablet 3 Taking   No Known Allergies  Social History   Tobacco Use   Smoking status: Never   Smokeless tobacco: Never  Substance Use Topics   Alcohol use: Yes    Alcohol/week: 1.0 standard drink of alcohol    Types: 1 Glasses of wine per week    Comment: 1 glass of wine per day    Family History  Problem Relation Age of Onset   Heart disease Mother    Alcohol abuse Father    Hypertension Father    Cancer Sister    Hypertension Sister    Breast cancer Neg Hx    Colon cancer Neg Hx    Colon polyps Neg Hx    Esophageal cancer Neg Hx    Stomach cancer Neg Hx    Rectal cancer Neg Hx      Review of Systems  Objective:  Physical Exam Vitals reviewed.  Constitutional:      Appearance: Normal appearance. She is obese.  HENT:     Head: Normocephalic and atraumatic.  Eyes:     Extraocular Movements: Extraocular movements intact.     Pupils: Pupils are equal, round, and reactive to light.  Cardiovascular:     Rate and Rhythm: Normal rate and regular rhythm.  Pulmonary:     Effort: Pulmonary effort is normal.     Breath sounds: Normal breath sounds.  Abdominal:     Palpations: Abdomen is soft.  Musculoskeletal:     Cervical back: Normal range of motion and neck supple.     Right hip: Tenderness and bony tenderness present. Decreased range of motion. Decreased strength.  Neurological:     Mental Status: She is alert and oriented to person, place, and time.  Psychiatric:        Behavior: Behavior normal.     Vital signs in last 24 hours:    Labs:   Estimated body mass index is 37.5 kg/m as calculated from the following:   Height as of 05/21/23: 5'  (1.524 m).   Weight as of 05/21/23: 87.1 kg.   Imaging Review Plain radiographs demonstrate severe degenerative joint disease of the right hip(s). The bone quality appears to be good for age and reported activity level.      Assessment/Plan:  End stage arthritis, right hip(s)  The patient history, physical examination, clinical judgement of the provider and imaging studies are consistent with end stage degenerative joint disease of the right hip(s) and total hip arthroplasty is deemed medically necessary. The treatment options including medical management, injection therapy, arthroscopy and arthroplasty were discussed at length. The risks and benefits of total hip arthroplasty were presented and reviewed. The risks due to aseptic loosening, infection, stiffness, dislocation/subluxation,  thromboembolic complications and other imponderables were discussed.  The patient acknowledged  the explanation, agreed to proceed with the plan and consent was signed. Patient is being admitted for inpatient treatment for surgery, pain control, PT, OT, prophylactic antibiotics, VTE prophylaxis, progressive ambulation and ADL's and discharge planning.The patient is planning to be discharged to skilled nursing facility

## 2023-05-28 NOTE — Care Plan (Signed)
 [facility] RN[facility] met with patient in office today for a scheduled weight check. She went to pre-op for her upcoming Right total hip arthroplasty and was not weighed correctly. Weighed in office and met surgical clearance hard stop. Discussed upcoming RTHA with Dr. Lucienne Ryder on 05/29/23 at [facility]. She did have a caregiver that was going to provide care, but she has had a heart attack and is recovering at the last minute and cannot provide assistance. Because patient has worked extremely hard to maintain weight and is ready for surgery, she will proceed with surgery. Anticipate maybe need for SNF for STR. Patient wants to go to [facility] and CM did speak with intake coordinator for them and gave patient's name on her behalf. They reported they would have bed availability this week. [facility] also given referral in case HHPT is needed instead of SNF. Reviewed all post op care with patient and will continue to follow for needs. Has all DME needed.

## 2023-05-28 NOTE — Telephone Encounter (Signed)
 [facility] pre-op call by [facility].

## 2023-05-29 ENCOUNTER — Encounter (HOSPITAL_COMMUNITY): Payer: Self-pay | Admitting: Orthopaedic Surgery

## 2023-05-29 ENCOUNTER — Observation Stay (HOSPITAL_COMMUNITY)

## 2023-05-29 ENCOUNTER — Ambulatory Visit (HOSPITAL_COMMUNITY)

## 2023-05-29 ENCOUNTER — Other Ambulatory Visit: Payer: Self-pay

## 2023-05-29 ENCOUNTER — Ambulatory Visit (HOSPITAL_BASED_OUTPATIENT_CLINIC_OR_DEPARTMENT_OTHER)

## 2023-05-29 ENCOUNTER — Observation Stay (HOSPITAL_COMMUNITY)
Admission: RE | Admit: 2023-05-29 | Discharge: 2023-05-30 | Disposition: A | Discharge status: Skilled Nursing Facility | Source: Ambulatory Visit | Attending: Orthopaedic Surgery | Admitting: Orthopaedic Surgery

## 2023-05-29 ENCOUNTER — Encounter (HOSPITAL_COMMUNITY)
Admission: RE | Disposition: A | Discharge status: Skilled Nursing Facility | Payer: Self-pay | Source: Ambulatory Visit | Attending: Orthopaedic Surgery

## 2023-05-29 ENCOUNTER — Ambulatory Visit (HOSPITAL_COMMUNITY): Payer: Self-pay | Admitting: Physician Assistant

## 2023-05-29 DIAGNOSIS — F418 Other specified anxiety disorders: Secondary | ICD-10-CM

## 2023-05-29 DIAGNOSIS — E1122 Type 2 diabetes mellitus with diabetic chronic kidney disease: Secondary | ICD-10-CM | POA: Diagnosis not present

## 2023-05-29 DIAGNOSIS — N183 Chronic kidney disease, stage 3 unspecified: Secondary | ICD-10-CM | POA: Diagnosis not present

## 2023-05-29 DIAGNOSIS — Z96653 Presence of artificial knee joint, bilateral: Secondary | ICD-10-CM | POA: Diagnosis not present

## 2023-05-29 DIAGNOSIS — Z96641 Presence of right artificial hip joint: Secondary | ICD-10-CM | POA: Diagnosis not present

## 2023-05-29 DIAGNOSIS — M1611 Unilateral primary osteoarthritis, right hip: Principal | ICD-10-CM | POA: Insufficient documentation

## 2023-05-29 DIAGNOSIS — Z79899 Other long term (current) drug therapy: Secondary | ICD-10-CM | POA: Diagnosis not present

## 2023-05-29 DIAGNOSIS — M169 Osteoarthritis of hip, unspecified: Secondary | ICD-10-CM | POA: Diagnosis present

## 2023-05-29 DIAGNOSIS — I10 Essential (primary) hypertension: Secondary | ICD-10-CM | POA: Diagnosis not present

## 2023-05-29 DIAGNOSIS — I2510 Atherosclerotic heart disease of native coronary artery without angina pectoris: Secondary | ICD-10-CM | POA: Diagnosis not present

## 2023-05-29 DIAGNOSIS — I129 Hypertensive chronic kidney disease with stage 1 through stage 4 chronic kidney disease, or unspecified chronic kidney disease: Secondary | ICD-10-CM | POA: Insufficient documentation

## 2023-05-29 DIAGNOSIS — N189 Chronic kidney disease, unspecified: Secondary | ICD-10-CM | POA: Diagnosis not present

## 2023-05-29 DIAGNOSIS — Z7982 Long term (current) use of aspirin: Secondary | ICD-10-CM | POA: Diagnosis not present

## 2023-05-29 DIAGNOSIS — Z471 Aftercare following joint replacement surgery: Secondary | ICD-10-CM | POA: Diagnosis not present

## 2023-05-29 HISTORY — PX: TOTAL HIP ARTHROPLASTY: SHX124

## 2023-05-29 SURGERY — ARTHROPLASTY, HIP, TOTAL, ANTERIOR APPROACH
Anesthesia: Spinal | Site: Hip | Laterality: Right

## 2023-05-29 MED ORDER — METHOCARBAMOL 1000 MG/10ML IJ SOLN: 500.0000 mg | Freq: Four times a day (QID) | INTRAMUSCULAR | Status: DC | PRN

## 2023-05-29 MED ORDER — PROPOFOL 500 MG/50ML IV EMUL
INTRAVENOUS | Status: DC | PRN
  Administered 2023-05-29: 75 ug/kg/min via INTRAVENOUS

## 2023-05-29 MED ORDER — CEFAZOLIN SODIUM-DEXTROSE 2-4 GM/100ML-% IV SOLN
2.0000 g | INTRAVENOUS | Status: AC
  Administered 2023-05-29: 2 g via INTRAVENOUS

## 2023-05-29 MED ORDER — ALUM & MAG HYDROXIDE-SIMETH 200-200-20 MG/5ML PO SUSP: 30.0000 mL | ORAL | Status: DC | PRN

## 2023-05-29 MED ORDER — SODIUM CHLORIDE 0.9 % IV SOLN: INTRAVENOUS | Status: DC

## 2023-05-29 MED ORDER — OXYCODONE HCL 5 MG PO TABS
10.0000 mg | ORAL_TABLET | ORAL | Status: DC | PRN
Start: 2023-05-29 — End: 2023-05-30
  Administered 2023-05-29 (×2): 15 mg via ORAL
  Administered 2023-05-30: 10 mg via ORAL
  Administered 2023-05-30: 15 mg via ORAL
  Filled 2023-05-29 (×4): qty 3

## 2023-05-29 MED ORDER — DROPERIDOL 2.5 MG/ML IJ SOLN
0.6250 mg | Freq: Once | INTRAMUSCULAR | Status: AC | PRN
  Administered 2023-05-29: 0.625 mg via INTRAVENOUS

## 2023-05-29 MED ORDER — POVIDONE-IODINE 10 % EX SWAB
2.0000 | Freq: Once | CUTANEOUS | Status: AC
  Administered 2023-05-29: 2 via TOPICAL

## 2023-05-29 MED ORDER — ACETAMINOPHEN 10 MG/ML IV SOLN: 1000.0000 mg | Freq: Once | INTRAVENOUS | Status: DC | PRN

## 2023-05-29 MED ORDER — 0.9 % SODIUM CHLORIDE (POUR BTL) OPTIME
TOPICAL | Status: DC | PRN
  Administered 2023-05-29: 1000 mL

## 2023-05-29 MED ORDER — PANTOPRAZOLE SODIUM 40 MG PO TBEC
40.0000 mg | DELAYED_RELEASE_TABLET | Freq: Every day | ORAL | Status: DC
  Administered 2023-05-29 – 2023-05-30 (×2): 40 mg via ORAL
  Filled 2023-05-29 (×2): qty 1

## 2023-05-29 MED ORDER — AMLODIPINE BESYLATE 10 MG PO TABS
10.0000 mg | ORAL_TABLET | Freq: Every day | ORAL | Status: DC
  Administered 2023-05-30: 10 mg via ORAL
  Filled 2023-05-29: qty 1

## 2023-05-29 MED ORDER — CHLORHEXIDINE GLUCONATE 0.12 % MT SOLN: 15.0000 mL | Freq: Once | OROMUCOSAL | Status: AC

## 2023-05-29 MED ORDER — MENTHOL 3 MG MT LOZG
1.0000 | LOZENGE | OROMUCOSAL | Status: DC | PRN
Start: 2023-05-29 — End: 2023-05-30

## 2023-05-29 MED ORDER — HYDROMORPHONE HCL 1 MG/ML IJ SOLN
0.5000 mg | INTRAMUSCULAR | Status: DC | PRN
Start: 2023-05-29 — End: 2023-05-30
  Administered 2023-05-29 – 2023-05-30 (×2): 1 mg via INTRAVENOUS
  Filled 2023-05-29 (×2): qty 1

## 2023-05-29 MED ORDER — TRANEXAMIC ACID-NACL 1000-0.7 MG/100ML-% IV SOLN
1000.0000 mg | INTRAVENOUS | Status: AC
  Administered 2023-05-29: 1000 mg via INTRAVENOUS

## 2023-05-29 MED ORDER — CHLORHEXIDINE GLUCONATE 0.12 % MT SOLN
OROMUCOSAL | Status: AC
  Administered 2023-05-29: 15 mL via OROMUCOSAL
  Filled 2023-05-29: qty 15

## 2023-05-29 MED ORDER — LACTATED RINGERS IV SOLN: INTRAVENOUS | Status: DC | PRN

## 2023-05-29 MED ORDER — ASPIRIN 81 MG PO CHEW
81.0000 mg | CHEWABLE_TABLET | Freq: Two times a day (BID) | ORAL | Status: DC
  Administered 2023-05-29 – 2023-05-30 (×2): 81 mg via ORAL
  Filled 2023-05-29 (×2): qty 1

## 2023-05-29 MED ORDER — PHENOL 1.4 % MT LIQD: 1.0000 | OROMUCOSAL | Status: DC | PRN

## 2023-05-29 MED ORDER — OXYCODONE HCL 5 MG PO TABS
5.0000 mg | ORAL_TABLET | ORAL | Status: DC | PRN
  Filled 2023-05-29: qty 2

## 2023-05-29 MED ORDER — BUPIVACAINE IN DEXTROSE 0.75-8.25 % IT SOLN
INTRATHECAL | Status: DC | PRN
  Administered 2023-05-29: 1.6 mL via INTRATHECAL

## 2023-05-29 MED ORDER — METOCLOPRAMIDE HCL 5 MG PO TABS: 5.0000 mg | ORAL_TABLET | Freq: Three times a day (TID) | ORAL | Status: DC | PRN

## 2023-05-29 MED ORDER — OXYCODONE HCL 5 MG PO TABS
ORAL_TABLET | ORAL | Status: AC
  Filled 2023-05-29: qty 1

## 2023-05-29 MED ORDER — TRANEXAMIC ACID-NACL 1000-0.7 MG/100ML-% IV SOLN
INTRAVENOUS | Status: AC
  Filled 2023-05-29: qty 100

## 2023-05-29 MED ORDER — SODIUM CHLORIDE 0.9% FLUSH: 3.0000 mL | INTRAVENOUS | Status: DC | PRN

## 2023-05-29 MED ORDER — PHENYLEPHRINE 80 MCG/ML (10ML) SYRINGE FOR IV PUSH (FOR BLOOD PRESSURE SUPPORT)
PREFILLED_SYRINGE | INTRAVENOUS | Status: DC | PRN
  Administered 2023-05-29 (×3): 80 ug via INTRAVENOUS
  Administered 2023-05-29: 160 ug via INTRAVENOUS
  Administered 2023-05-29 (×2): 80 ug via INTRAVENOUS

## 2023-05-29 MED ORDER — ACETAMINOPHEN 325 MG PO TABS: 325.0000 mg | ORAL_TABLET | Freq: Four times a day (QID) | ORAL | Status: DC | PRN

## 2023-05-29 MED ORDER — ALBUMIN HUMAN 5 % IV SOLN
12.5000 g | Freq: Once | INTRAVENOUS | Status: AC
  Administered 2023-05-29: 12.5 g via INTRAVENOUS

## 2023-05-29 MED ORDER — OXYCODONE HCL 5 MG PO TABS
5.0000 mg | ORAL_TABLET | Freq: Once | ORAL | Status: AC | PRN
  Administered 2023-05-29: 5 mg via ORAL

## 2023-05-29 MED ORDER — ONDANSETRON HCL 4 MG PO TABS: 4.0000 mg | ORAL_TABLET | Freq: Four times a day (QID) | ORAL | Status: DC | PRN

## 2023-05-29 MED ORDER — OXYCODONE HCL 5 MG/5ML PO SOLN: 5.0000 mg | Freq: Once | ORAL | Status: AC | PRN

## 2023-05-29 MED ORDER — FENTANYL CITRATE (PF) 100 MCG/2ML IJ SOLN
25.0000 ug | INTRAMUSCULAR | Status: DC | PRN
  Administered 2023-05-29 (×4): 25 ug via INTRAVENOUS

## 2023-05-29 MED ORDER — FENTANYL CITRATE (PF) 100 MCG/2ML IJ SOLN
INTRAMUSCULAR | Status: AC
  Filled 2023-05-29: qty 2

## 2023-05-29 MED ORDER — ONDANSETRON HCL 4 MG/2ML IJ SOLN: 4.0000 mg | Freq: Four times a day (QID) | INTRAMUSCULAR | Status: DC | PRN

## 2023-05-29 MED ORDER — METOCLOPRAMIDE HCL 5 MG/ML IJ SOLN: 5.0000 mg | Freq: Three times a day (TID) | INTRAMUSCULAR | Status: DC | PRN

## 2023-05-29 MED ORDER — METHOCARBAMOL 500 MG PO TABS
500.0000 mg | ORAL_TABLET | Freq: Four times a day (QID) | ORAL | Status: DC | PRN
  Administered 2023-05-29: 500 mg via ORAL
  Filled 2023-05-29: qty 1

## 2023-05-29 MED ORDER — SODIUM CHLORIDE 0.9% FLUSH: 3.0000 mL | Freq: Two times a day (BID) | INTRAVENOUS | Status: DC

## 2023-05-29 MED ORDER — POLYETHYLENE GLYCOL 3350 17 G PO PACK: 17.0000 g | PACK | Freq: Every day | ORAL | Status: DC | PRN

## 2023-05-29 MED ORDER — ORAL CARE MOUTH RINSE: 15.0000 mL | Freq: Once | OROMUCOSAL | Status: AC

## 2023-05-29 MED ORDER — DOCUSATE SODIUM 100 MG PO CAPS
100.0000 mg | ORAL_CAPSULE | Freq: Two times a day (BID) | ORAL | Status: DC
  Administered 2023-05-29 – 2023-05-30 (×3): 100 mg via ORAL
  Filled 2023-05-29 (×3): qty 1

## 2023-05-29 MED ORDER — ALBUMIN HUMAN 5 % IV SOLN
INTRAVENOUS | Status: AC
  Filled 2023-05-29: qty 250

## 2023-05-29 MED ORDER — ROSUVASTATIN CALCIUM 20 MG PO TABS
20.0000 mg | ORAL_TABLET | Freq: Every day | ORAL | Status: DC
  Administered 2023-05-29 – 2023-05-30 (×2): 20 mg via ORAL
  Filled 2023-05-29 (×2): qty 1

## 2023-05-29 MED ORDER — DIPHENHYDRAMINE HCL 12.5 MG/5ML PO ELIX
12.5000 mg | ORAL_SOLUTION | ORAL | Status: DC | PRN
Start: 2023-05-29 — End: 2023-05-30

## 2023-05-29 MED ORDER — DEXAMETHASONE SODIUM PHOSPHATE 10 MG/ML IJ SOLN
INTRAMUSCULAR | Status: DC | PRN
  Administered 2023-05-29: 10 mg via INTRAVENOUS

## 2023-05-29 MED ORDER — CEFAZOLIN SODIUM-DEXTROSE 2-4 GM/100ML-% IV SOLN
INTRAVENOUS | Status: AC
  Filled 2023-05-29: qty 100

## 2023-05-29 MED ORDER — CEFAZOLIN SODIUM-DEXTROSE 2-4 GM/100ML-% IV SOLN
2.0000 g | Freq: Two times a day (BID) | INTRAVENOUS | Status: AC
  Administered 2023-05-29 – 2023-05-30 (×2): 2 g via INTRAVENOUS
  Filled 2023-05-29 (×2): qty 100

## 2023-05-29 MED ORDER — DROPERIDOL 2.5 MG/ML IJ SOLN
INTRAMUSCULAR | Status: AC
  Filled 2023-05-29: qty 2

## 2023-05-29 MED ORDER — SODIUM CHLORIDE 0.9 % IR SOLN
Status: DC | PRN
  Administered 2023-05-29: 1000 mL

## 2023-05-29 SURGICAL SUPPLY — 47 items
BAG COUNTER SPONGE SURGICOUNT (BAG) ×1 IMPLANT
BENZOIN TINCTURE PRP APPL 2/3 (GAUZE/BANDAGES/DRESSINGS) ×1 IMPLANT
BLADE CLIPPER SURG (BLADE) IMPLANT
BLADE SAW SGTL 18X1.27X75 (BLADE) ×1 IMPLANT
COVER SURGICAL LIGHT HANDLE (MISCELLANEOUS) ×1 IMPLANT
CUP SECTOR GRIPTON 50MM (Cup) IMPLANT
DRAPE C-ARM 42X72 X-RAY (DRAPES) ×1 IMPLANT
DRAPE STERI IOBAN 125X83 (DRAPES) ×1 IMPLANT
DRAPE U-SHAPE 47X51 STRL (DRAPES) ×3 IMPLANT
DRSG AQUACEL AG ADV 3.5X10 (GAUZE/BANDAGES/DRESSINGS) ×1 IMPLANT
DURAPREP 26ML APPLICATOR (WOUND CARE) ×1 IMPLANT
ELECT BLADE 4.0 EZ CLEAN MEGAD (MISCELLANEOUS) ×1 IMPLANT
ELECT BLADE 6.5 EXT (BLADE) IMPLANT
ELECT REM PT RETURN 9FT ADLT (ELECTROSURGICAL) ×1 IMPLANT
ELECTRODE BLDE 4.0 EZ CLN MEGD (MISCELLANEOUS) ×1 IMPLANT
ELECTRODE REM PT RTRN 9FT ADLT (ELECTROSURGICAL) ×1 IMPLANT
FACESHIELD WRAPAROUND (MASK) ×2 IMPLANT
FACESHIELD WRAPAROUND OR TEAM (MASK) ×2 IMPLANT
FEM STEM 12/14 TAPER SZ 4 HIP (Orthopedic Implant) ×1 IMPLANT
FEMORAL STEM 12/14 TPR SZ4 HIP (Orthopedic Implant) IMPLANT
GLOVE BIOGEL PI IND STRL 8 (GLOVE) ×2 IMPLANT
GLOVE ECLIPSE 8.0 STRL XLNG CF (GLOVE) ×1 IMPLANT
GLOVE ORTHO TXT STRL SZ7.5 (GLOVE) ×2 IMPLANT
GOWN STRL REUS W/ TWL LRG LVL3 (GOWN DISPOSABLE) ×2 IMPLANT
GOWN STRL REUS W/ TWL XL LVL3 (GOWN DISPOSABLE) ×2 IMPLANT
HEAD FEM STD 32X+9 STRL (Hips) IMPLANT
KIT BASIN OR (CUSTOM PROCEDURE TRAY) ×1 IMPLANT
KIT TURNOVER KIT B (KITS) ×1 IMPLANT
LINER ACET PNNCL PLUS4 NEUTRAL (Hips) IMPLANT
MANIFOLD NEPTUNE II (INSTRUMENTS) ×1 IMPLANT
NS IRRIG 1000ML POUR BTL (IV SOLUTION) ×1 IMPLANT
PACK TOTAL JOINT (CUSTOM PROCEDURE TRAY) ×1 IMPLANT
PAD ARMBOARD POSITIONER FOAM (MISCELLANEOUS) ×1 IMPLANT
PINNACLE PLUS 4 NEUTRAL (Hips) ×1 IMPLANT
SET HNDPC FAN SPRY TIP SCT (DISPOSABLE) ×1 IMPLANT
STAPLER VISISTAT 35W (STAPLE) IMPLANT
STRIP CLOSURE SKIN 1/2X4 (GAUZE/BANDAGES/DRESSINGS) ×2 IMPLANT
SUT ETHIBOND NAB CT1 #1 30IN (SUTURE) ×1 IMPLANT
SUT MNCRL AB 4-0 PS2 18 (SUTURE) IMPLANT
SUT VIC AB 0 CT1 27XBRD ANBCTR (SUTURE) ×1 IMPLANT
SUT VIC AB 1 CT1 27XBRD ANBCTR (SUTURE) ×1 IMPLANT
SUT VIC AB 2-0 CT1 TAPERPNT 27 (SUTURE) ×1 IMPLANT
TOWEL GREEN STERILE (TOWEL DISPOSABLE) ×1 IMPLANT
TOWEL GREEN STERILE FF (TOWEL DISPOSABLE) ×1 IMPLANT
TRAY CATH INTERMITTENT SS 16FR (CATHETERS) IMPLANT
TRAY FOLEY W/BAG SLVR 16FR ST (SET/KITS/TRAYS/PACK) IMPLANT
WATER STERILE IRR 1000ML POUR (IV SOLUTION) ×2 IMPLANT

## 2023-05-29 NOTE — NC FL2 (Signed)
 [place]  MEDICAID FL2 LEVEL OF CARE FORM     IDENTIFICATION  Patient Name: Kelly [name] Birthdate: Jun 18, 1945 Sex: female Admission Date (Current Location): 05/29/2023  [facility] and [place] Number:  Producer, television/film/video and Address:  [facility] [place], [address], [place], [place] [address]      Provider Number: [id number]  Attending Physician Name and Address:  Arnie Lao,*  Relative Name and Phone Number:  Heidemarie, [place]   [phone]    Current Level of Care: Hospital Recommended Level of Care: Skilled Nursing Facility Prior Approval Number:    Date Approved/Denied:   PASRR Number: [id number]  Discharge Plan: SNF    Current Diagnoses: Patient Active Problem List   Diagnosis Date Noted   OA (osteoarthritis) of hip 05/29/2023   Status post total replacement of right hip 05/29/2023   Unilateral primary osteoarthritis, right hip 05/28/2023   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) 02/09/2022   Status post revision of total knee, right 12/09/2021   Arthritis of right knee 12/08/2021   Chronic left-sided low back pain with left-sided sciatica 12/17/2019   Type 2 diabetes mellitus with obesity (HCC) 12/13/2019   Chronic rhinitis 03/03/2019   Constipation 10/31/2016   Impaired glucose tolerance 10/31/2016   Stage III chronic kidney disease (HCC) 10/31/2016   Urge urinary incontinence 10/31/2016   Status post right partial knee replacement 10/02/2014   Anxiety and depression 11/06/2011   Hypertension 10/13/2010   Hyperlipidemia 10/13/2010   Osteoarthritis 10/13/2010   UNSPECIFIED IRON DEFICIENCY ANEMIA 04/11/2010   DIARRHEA 04/11/2010    Orientation RESPIRATION BLADDER Height & Weight     Self, Time, Situation, Place  Normal Indwelling catheter, Continent Weight: 192 lb (87.1 kg) Height:  5' (152.4 cm)  BEHAVIORAL SYMPTOMS/MOOD NEUROLOGICAL BOWEL NUTRITION STATUS      Continent Diet (see discharge  summary)  AMBULATORY STATUS COMMUNICATION OF NEEDS Skin   Limited Assist Verbally Surgical wounds                       Personal Care Assistance Level of Assistance  Bathing, Feeding, Dressing Bathing Assistance: Limited assistance Feeding assistance: Independent Dressing Assistance: Limited assistance     Functional Limitations Info  Sight, Hearing, Speech Sight Info: Adequate Hearing Info: Adequate Speech Info: Adequate    SPECIAL CARE FACTORS FREQUENCY  PT (By licensed PT), OT (By licensed OT)     PT Frequency: 5x week OT Frequency: 5x week            Contractures Contractures Info: Not present    Additional Factors Info  Code Status, Allergies Code Status Info: full Allergies Info: NKA           Current Medications (05/29/2023):  This is the current hospital active medication list Current Facility-Administered Medications  Medication Dose Route Frequency Provider Last Rate Last Admin   0.9 %  sodium chloride infusion   Intravenous Continuous Arnie Lao, MD       [START ON 05/30/2023] acetaminophen (TYLENOL) tablet 325-650 mg  325-650 mg Oral Q6H PRN Arnie Lao, MD       albumin human 5 % solution            alum & mag hydroxide-simeth (MAALOX/MYLANTA) 200-200-20 MG/5ML suspension 30 mL  30 mL Oral Q4H PRN Arnie Lao, MD       [START ON 05/30/2023] amLODipine (NORVASC) tablet 10 mg  10 mg Oral Daily Arnie Lao, MD  aspirin chewable tablet 81 mg  81 mg Oral BID Blackman, Christopher Y, MD       ceFAZolin (ANCEF) IVPB 2g/100 mL premix  2 g Intravenous Q12H Blackman, Christopher Y, MD       diphenhydrAMINE (BENADRYL) 12.5 MG/5ML elixir 12.5-25 mg  12.5-25 mg Oral Q4H PRN Arnie Lao, MD       docusate sodium (COLACE) capsule 100 mg  100 mg Oral BID Arnie Lao, MD   100 mg at 05/29/23 1229   droperidol (INAPSINE) 2.5 MG/ML injection            fentaNYL (SUBLIMAZE) 100 MCG/2ML  injection            HYDROmorphone (DILAUDID) injection 0.5-1 mg  0.5-1 mg Intravenous Q4H PRN Arnie Lao, MD   1 mg at 05/29/23 1228   menthol-cetylpyridinium (CEPACOL) lozenge 3 mg  1 lozenge Oral PRN Arnie Lao, MD       Or   phenol (CHLORASEPTIC) mouth spray 1 spray  1 spray Mouth/Throat PRN Arnie Lao, MD       methocarbamol (ROBAXIN) tablet 500 mg  500 mg Oral Q6H PRN Arnie Lao, MD       Or   methocarbamol (ROBAXIN) injection 500 mg  500 mg Intravenous Q6H PRN Arnie Lao, MD       metoCLOPramide (REGLAN) tablet 5 mg  5 mg Oral Q8H PRN Arnie Lao, MD       Or   metoCLOPramide (REGLAN) injection 5 mg  5 mg Intravenous Q8H PRN Arnie Lao, MD       ondansetron (ZOFRAN) tablet 4 mg  4 mg Oral Q6H PRN Arnie Lao, MD       Or   ondansetron (ZOFRAN) injection 4 mg  4 mg Intravenous Q6H PRN Arnie Lao, MD       oxyCODONE (Oxy IR/ROXICODONE) 5 MG immediate release tablet            oxyCODONE (Oxy IR/ROXICODONE) immediate release tablet 10-15 mg  10-15 mg Oral Q4H PRN Arnie Lao, MD       oxyCODONE (Oxy IR/ROXICODONE) immediate release tablet 5-10 mg  5-10 mg Oral Q4H PRN Arnie Lao, MD       pantoprazole (PROTONIX) EC tablet 40 mg  40 mg Oral Daily Arnie Lao, MD   40 mg at 05/29/23 1229   polyethylene glycol (MIRALAX / GLYCOLAX) packet 17 g  17 g Oral Daily PRN Arnie Lao, MD       rosuvastatin (CRESTOR) tablet 20 mg  20 mg Oral Daily Blackman, Christopher Y, MD   20 mg at 05/29/23 1229     Discharge Medications: Please see discharge summary for a list of discharge medications.  Relevant Imaging Results:  Relevant Lab Results:   Additional Information SSN: [id number]  Elspeth Hals, LCSW

## 2023-05-29 NOTE — Interval H&P Note (Signed)
 History and Physical Interval Note: The patient understands that she is here today for a right total hip replacement to treat her significant right hip pain and arthritis.  There has been no acute or interval change in her medical status.  The risks and benefits of surgery have been discussed in detail and informed consent has been obtained.  The right operative hip has been marked.  05/29/2023 7:14 AM  Kelly [name]  has presented today for surgery, with the diagnosis of Osteoarthritis Right Hip.  The various methods of treatment have been discussed with the patient and family. After consideration of risks, benefits and other options for treatment, the patient has consented to  Procedure(s): ARTHROPLASTY, HIP, TOTAL, ANTERIOR APPROACH (Right) as a surgical intervention.  The patient's history has been reviewed, patient examined, no change in status, stable for surgery.  I have reviewed the patient's chart and labs.  Questions were answered to the patient's satisfaction.     Arnie Lao

## 2023-05-29 NOTE — Evaluation (Signed)
 Physical Therapy Evaluation Patient Details Name: Kelly [name] MRN: [id number] DOB: 12/08/1945 Today's Date: 05/29/2023  History of Present Illness  78 y.o. female presents to [facility] hospital on 05/29/2023 for elective R THA. PMH includes HTN, HLD, OA, anxiety, depression, CKD III, DMII.  Clinical Impression  Pt presents to PT with deficits in strength, power, ROM, gait, balance. Pt is able to transfer and ambulate for short distances with support of RW. PT notes a R knee contracture, with incomplete extension during stance phase on RLE. Pt will benefit from continued frequent mobilization to aide in improving strength, stability and endurance.  PT will continue to follow in the acute setting.       If plan is discharge home, recommend the following: A little help with walking and/or transfers;A lot of help with bathing/dressing/bathroom;Assistance with cooking/housework;Assist for transportation;Help with stairs or ramp for entrance   Can travel by private vehicle   Yes    Equipment Recommendations None recommended by PT  Recommendations for Other Services       Functional Status Assessment Patient has had a recent decline in their functional status and demonstrates the ability to make significant improvements in function in a reasonable and predictable amount of time.     Precautions / Restrictions Precautions Precautions: Fall Recall of Precautions/Restrictions: Intact Precaution/Restrictions Comments: direct anterior THA Restrictions Weight Bearing Restrictions Per Provider Order: Yes RLE Weight Bearing Per Provider Order: Weight bearing as tolerated      Mobility  Bed Mobility Overal bed mobility: Needs Assistance Bed Mobility: Rolling, Supine to Sit, Sit to Supine Rolling: Min assist   Supine to sit: Min assist Sit to supine: Min assist        Transfers Overall transfer level: Needs assistance Equipment used: Rolling walker (2 wheels) Transfers: Sit to/from  Stand Sit to Stand: Contact guard assist                Ambulation/Gait Ambulation/Gait assistance: Contact guard assist Gait Distance (Feet): 20 Feet (20' x 2 trials) Assistive device: Rolling walker (2 wheels) Gait Pattern/deviations: Step-to pattern, Wide base of support Gait velocity: reduced Gait velocity interpretation: <1.31 ft/sec, indicative of household ambulator   General Gait Details: pt with slowed step-to gait, widened BOS with RLE initially outside of walker frame but pt is able to correct with verbal cueing from PT  Stairs            Wheelchair Mobility     Tilt Bed    Modified Rankin (Stroke Patients Only)       Balance Overall balance assessment: Needs assistance Sitting-balance support: No upper extremity supported, Feet supported Sitting balance-Leahy Scale: Good     Standing balance support: Bilateral upper extremity supported, Reliant on assistive device for balance Standing balance-Leahy Scale: Poor                               Pertinent Vitals/Pain Pain Assessment Pain Assessment: Faces Faces Pain Scale: Hurts even more Pain Location: R hip Pain Descriptors / Indicators: Sore Pain Intervention(s): Monitored during session    Home Living Family/patient expects to be discharged to:: Private residence Living Arrangements: Alone Available Help at Discharge: Friend(s);Available PRN/intermittently Type of Home: House Home Access: Stairs to enter Entrance Stairs-Rails: Can reach both Entrance Stairs-Number of Steps: 2   Home Layout: One level Home Equipment: Agricultural consultant (2 wheels);Rollator (4 wheels);Cane - single point;BSC/3in1;Shower seat      Prior Function Prior  Level of Function : Independent/Modified Independent;Driving             Mobility Comments: ambulatory with RW       Extremity/Trunk Assessment   Upper Extremity Assessment Upper Extremity Assessment: Overall WFL for tasks assessed     Lower Extremity Assessment Lower Extremity Assessment: RLE deficits/detail RLE Deficits / Details: generalized post-op weakness as anticipated on POD 0. Pt does appear to have a mild knee flexion contracture of R knee    Cervical / Trunk Assessment Cervical / Trunk Assessment: Normal  Communication   Communication Communication: No apparent difficulties    Cognition Arousal: Alert Behavior During Therapy: WFL for tasks assessed/performed   PT - Cognitive impairments: No apparent impairments                         Following commands: Intact       Cueing Cueing Techniques: Verbal cues     General Comments General comments (skin integrity, edema, etc.): VSS on RA    Exercises     Assessment/Plan    PT Assessment Patient needs continued PT services  PT Problem List Decreased strength;Decreased activity tolerance;Decreased balance;Decreased mobility;Decreased knowledge of use of DME;Decreased range of motion;Pain       PT Treatment Interventions DME instruction;Gait training;Stair training;Functional mobility training;Therapeutic activities;Therapeutic exercise;Balance training;Neuromuscular re-education;Patient/family education    PT Goals (Current goals can be found in the Care Plan section)  Acute Rehab PT Goals Patient Stated Goal: to return to independence PT Goal Formulation: With patient Time For Goal Achievement: 06/02/23 Potential to Achieve Goals: Good    Frequency 7X/week     Co-evaluation               AM-PAC PT "6 Clicks" Mobility  Outcome Measure Help needed turning from your back to your side while in a flat bed without using bedrails?: A Little Help needed moving from lying on your back to sitting on the side of a flat bed without using bedrails?: A Little Help needed moving to and from a bed to a chair (including a wheelchair)?: A Little Help needed standing up from a chair using your arms (e.g., wheelchair or bedside chair)?:  A Little Help needed to walk in hospital room?: A Little Help needed climbing 3-5 steps with a railing? : Total 6 Click Score: 16    End of Session Equipment Utilized During Treatment: Gait belt Activity Tolerance: Patient tolerated treatment well Patient left: in bed;with call bell/phone within reach;with bed alarm set Nurse Communication: Mobility status PT Visit Diagnosis: Other abnormalities of gait and mobility (R26.89);Muscle weakness (generalized) (M62.81);Pain Pain - Right/Left: Right Pain - part of body: Hip    Time: 4098-1191 PT Time Calculation (min) (ACUTE ONLY): 28 min   Charges:   PT Evaluation $PT Eval Low Complexity: 1 Low   PT General Charges $$ ACUTE PT VISIT: 1 Visit         Rexie Catena, PT, DPT Acute Rehabilitation Office [phone]   Rexie Catena 05/29/2023, 2:40 PM

## 2023-05-29 NOTE — Anesthesia Procedure Notes (Signed)
 Spinal  Patient location during procedure: OR Start time: 05/29/2023 7:30 AM End time: 05/29/2023 7:37 AM Reason for block: surgical anesthesia Staffing Performed: anesthesiologist  Anesthesiologist: Lethaniel Rave, MD Performed by: Lethaniel Rave, MD Authorized by: Lethaniel Rave, MD   Preanesthetic Checklist Completed: patient identified, IV checked, site marked, risks and benefits discussed, surgical consent, monitors and equipment checked, pre-op evaluation and timeout performed Spinal Block Patient position: sitting Prep: DuraPrep Patient monitoring: heart rate, cardiac monitor, continuous pulse ox and blood pressure Approach: midline Location: L4-5 Needle Needle type: Pencan  Needle gauge: 24 G Assessment Sensory level: T6 Additional Notes Functioning IV was confirmed and monitors were applied. Sterile prep and drape, including hand hygiene and sterile gloves were used. The patient was positioned and the spine was prepped. The skin was anesthetized with lidocaine.  Free flow of clear CSF was obtained prior to injecting local anesthetic into the CSF.  The spinal needle aspirated freely following injection.  The needle was carefully withdrawn.  The patient tolerated the procedure well.

## 2023-05-29 NOTE — Anesthesia Postprocedure Evaluation (Signed)
 Anesthesia Post Note  Patient: Kelly [name]  Procedure(s) Performed: RIGHT TOTAL HIP ARTHOPLASTY (Right: Hip)     Patient location during evaluation: PACU Anesthesia Type: Spinal Level of consciousness: oriented and awake and alert Pain management: pain level controlled Vital Signs Assessment: post-procedure vital signs reviewed and stable Respiratory status: spontaneous breathing, respiratory function stable and patient connected to nasal cannula oxygen Cardiovascular status: blood pressure returned to baseline and stable Postop Assessment: no headache, no backache and no apparent nausea or vomiting Anesthetic complications: no   No notable events documented.  Last Vitals:  Vitals:   05/29/23 1200 05/29/23 1211  BP: 107/64 (!) 108/57  Pulse: 65 65  Resp: 15 17  Temp: 36.8 C 36.6 C  SpO2: 93% 94%    Last Pain:  Vitals:   05/29/23 1258  TempSrc:   PainSc: Asleep                 Lethaniel Rave

## 2023-05-29 NOTE — Op Note (Signed)
 Operative Note  Date of operation: 05/29/2023 Preoperative diagnosis: Right hip primary osteoarthritis Postoperative diagnosis: Same  Procedure: Right direct anterior total hip arthroplasty  Implants: Implant Name Type Inv. Item Serial No. Manufacturer Lot No. LRB No. Used Action  CUP SECTOR GRIPTON - [id number] Cup CUP SECTOR GRIPTON  DEPUY ORTH[employer] [id number] Right 1 Implanted  PINNACLE PLUS 4 NEUTRAL - [id number] Hips PINNACLE PLUS 4 NEUTRAL  DEPUY ORTHOPAEDICS [id number] Right 1 Implanted  FEM STEM 12/14 TAPER SZ 4 HIP - [id number] Orthopedic Implant FEM STEM 12/14 TAPER SZ 4 HIP  DEPUY ORTHOPAEDICS [id number] Right 1 Implanted  HEAD FEM STD 32X+9 STRL - [id number] Hips HEAD FEM STD 32X+9 Jolayne Haines ORTH[employer]ED[employer] [id number] Right 1 Implanted   Surgeon: Vanita Panda. Magnus Ivan, MD Assistant: Hart Carwin RNFA  Anesthesia: Spinal Antibiotics: IV Ancef EBL: 250 cc Complications: None  Indications: The patient is a 78 year old female with significant arthritis involving her right hip.  She does have right hip pain on a daily basis and it is detrimentally affecting her mobility, her quality of life and her actives daily living to the point she does wish to proceed with a hip replacement.  She has tried and failed conservative treatment for over a year.  We did discuss the risks of acute blood loss anemia, nerve vessel injury, fracture, infection, DVT, implant failure, leg length differences, dislocation and wound healing issues.  She understands that our goals are hopefully decreased pain, improved mobility and improved quality of life.  Procedure description: After informed consent was obtained and the appropriate right hip was marked, the patient was brought to the operating room and and set up on her stretcher where spinal anesthesia was obtained.  She was then laid in supine position on the stretcher and a Foley catheter was placed.  Traction boots were placed on both her feet and  next she was placed supine on the Hana fracture table with a perineal post in place and both legs in inline skeletal traction devices with no traction applied.  Her right operative hip and pelvis were assessed radiographically.  The right hip was prepped and draped with DuraPrep and sterile drapes.  A timeout was called and she was identified as the correct patient and correct right hip.  An incision was then made just inferior and posterior to the ASIS and carried slightly obliquely down the leg.  Dissection was carried down to the tensor fascia lata muscle and the tensor fascia was divided longitudinally to proceed with a direct anterior approach of the hip.  Circumflex vessels were identified and cauterized.  The hip capsule was identified and opened up in L-type format.  Cobra retractors were placed around the medial and lateral femoral neck and a femoral neck cut was made with an oscillating saw just proximal to the lesser trochanter.  This cut was completed with an osteotome.  A corkscrew guide was placed in the femoral head and the femoral head was removed in its entirety.  A bent Hohmann was then placed over the medial acetabular rim and remnants of the acetabular labrum and other debris were removed.  Reaming was then initiated from a size 43 reamer and stepwise increments going up to a size 49 reamer with all reamers placed under direct visualization and the last reamer also placed under direct fluoroscopy in order to obtain the depth of reaming, the inclination and the anteversion.  The real DePuy Sectra GRIPTION acetabular component size 50 was then placed without difficulty followed by  a 32+4 polythene liner.  Attention was then turned to the femur.  With the right leg externally rotated to 120 degrees, extended and adducted, a Mueller retractor was placed medially and a Hohmann retractor was placed behind the greater trochanter.  The lateral joint capsule was released and a box cutting osteotome was  used in her femoral canal.  Broaching was then initiated using the Actis broaching system from a size 0 going to a size 4.  With a size 4 in place we trialed a high offset femoral neck and a 32+1 trial hip ball.  This was reduced in the pelvis and it was quite tight but we are pleased with stability and leg length as well as offset assessed radiographically and clinically.  We then dislocated the hip and over the trial components.  We placed the real Actis femoral component with high offset size 4 and the real 32+9 metal head ball.  Again this was reduced in the pelvis and we assessed it radiographically and clinically and replaced with stability as well as radiographic measurements.  We then irrigated the soft tissue with normal saline solution.  Remnants of the joint capsule were closed with interrupted #1 Ethibond suture followed by #1 Vicryl to close the tensor fascia.  0 Vicryl was used to close deep tissue and 2-0 Vicryl is used to close subcutaneous tissue.  The skin was closed with staples.  An Aquacel dressing was applied.  The patient was taken off of the Hana table and taken to the recovery in stable condition.

## 2023-05-29 NOTE — TOC Initial Note (Addendum)
 Transition of Care [facility]) - Initial/Assessment Note    Patient Details  Name: Kelly [name] MRN: [id number] Date of Birth: 08/31/1945  Transition of Care [facility]) CM/SW Contact:    Elspeth Hals, LCSW Phone Number: 05/29/2023, 3:30 PM  Clinical Narrative:  CSW met with pt regarding PT recommendation for SNF.  Pt is in agreement with this, requesting [facility], where she has been before.  Pt from home alone, no current services.  Permission given to speak with friend/POA Cornelius Dill.    Referral sent to [facility], CSW reached out to [facility] to review.                 1550: [name] offer bed.  Expected Discharge Plan: Skilled Nursing Facility Barriers to Discharge: Continued Medical Work up, SNF Pending bed offer   Patient Goals and CMS Choice Patient states their goals for this hospitalization and ongoing recovery are:: back to normal   Choice offered to / list presented to : Patient (requesting [facility])      Expected Discharge Plan and Services In-house Referral: Clinical Social Work   Post Acute Care Choice: Skilled Nursing Facility Living arrangements for the past 2 months: Single Family Home                                      Prior Living Arrangements/Services Living arrangements for the past 2 months: Single Family Home Lives with:: Self Patient language and need for interpreter reviewed:: Yes Do you feel safe going back to the place where you live?: Yes      Need for Family Participation in Patient Care: No (Comment) Care giver support system in place?: Yes (comment) Current home services: Other (comment) (none) Criminal Activity/Legal Involvement Pertinent to Current Situation/Hospitalization: No - Comment as needed  Activities of Daily Living      Permission Sought/Granted Permission sought to share information with : Family Supports Permission granted to share information with : Yes, Verbal Permission Granted  Share Information with  NAME: friend/POA Franci  Permission granted to share info w AGENCY: SNF        Emotional Assessment Appearance:: Appears stated age Attitude/Demeanor/Rapport: Engaged Affect (typically observed): Appropriate, Pleasant Orientation: : Oriented to Self, Oriented to Place, Oriented to  Time, Oriented to Situation      Admission diagnosis:  Primary osteoarthritis of right hip [M16.11] OA (osteoarthritis) of hip [M16.9] Status post total replacement of right hip [Z96.641] Patient Active Problem List   Diagnosis Date Noted   OA (osteoarthritis) of hip 05/29/2023   Status post total replacement of right hip 05/29/2023   Unilateral primary osteoarthritis, right hip 05/28/2023   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) 02/09/2022   Status post revision of total knee, right 12/09/2021   Arthritis of right knee 12/08/2021   Chronic left-sided low back pain with left-sided sciatica 12/17/2019   Type 2 diabetes mellitus with obesity (HCC) 12/13/2019   Chronic rhinitis 03/03/2019   Constipation 10/31/2016   Impaired glucose tolerance 10/31/2016   Stage III chronic kidney disease (HCC) 10/31/2016   Urge urinary incontinence 10/31/2016   Status post right partial knee replacement 10/02/2014   Anxiety and depression 11/06/2011   Hypertension 10/13/2010   Hyperlipidemia 10/13/2010   Osteoarthritis 10/13/2010   UNSPECIFIED IRON DEFICIENCY ANEMIA 04/11/2010   DIARRHEA 04/11/2010   PCP:  Sylvan Evener, MD Pharmacy:   [facility] [phone] Jonette Nestle,  [place] - [address] AT [facility] & [address] [place] [address] Phone: [phone] Fax: [phone]     Social Drivers of Health (SDOH) Social History: SDOH Screenings   Food Insecurity: No Food Insecurity (12/14/2022)  Housing: Low Risk  (12/09/2021)  Transportation Needs: No Transportation Needs (12/14/2022)  Utilities: Not At Risk (12/14/2022)  Depression (PHQ2-9):  Low Risk  (12/14/2022)  Financial Resource Strain: Low Risk  (12/14/2022)  Physical Activity: Sufficiently Active (12/14/2022)  Social Connections: Moderately Isolated (12/14/2022)  Stress: No Stress Concern Present (12/14/2022)  Tobacco Use: Low Risk  (05/29/2023)  Health Literacy: Adequate Health Literacy (12/14/2022)   SDOH Interventions:     Readmission Risk Interventions     No data to display

## 2023-05-29 NOTE — Transfer of Care (Signed)
 Immediate Anesthesia Transfer of Care Note  Patient: Kelly [name]  Procedure(s) Performed: RIGHT TOTAL HIP ARTHOPLASTY (Right: Hip)  Patient Location: PACU  Anesthesia Type:MAC and Spinal  Level of Consciousness: awake, alert , and oriented  Airway & Oxygen Therapy: Patient Spontanous Breathing  Post-op Assessment: Report given to RN and Post -op Vital signs reviewed and stable  Post vital signs: Reviewed and stable  Last Vitals:  Vitals Value Taken Time  BP 122/81 05/29/23 0915  Temp    Pulse 63 05/29/23 0924  Resp 24 05/29/23 0924  SpO2 100 % 05/29/23 0924  Vitals shown include unfiled device data.  Last Pain:  Vitals:   05/29/23 0912  TempSrc:   PainSc: Asleep         Complications: No notable events documented.

## 2023-05-30 ENCOUNTER — Encounter (HOSPITAL_COMMUNITY): Payer: Self-pay | Admitting: Orthopaedic Surgery

## 2023-05-30 ENCOUNTER — Other Ambulatory Visit (HOSPITAL_COMMUNITY): Payer: Self-pay

## 2023-05-30 DIAGNOSIS — E1122 Type 2 diabetes mellitus with diabetic chronic kidney disease: Secondary | ICD-10-CM | POA: Diagnosis not present

## 2023-05-30 DIAGNOSIS — M62838 Other muscle spasm: Secondary | ICD-10-CM | POA: Diagnosis not present

## 2023-05-30 DIAGNOSIS — I10 Essential (primary) hypertension: Secondary | ICD-10-CM | POA: Diagnosis not present

## 2023-05-30 DIAGNOSIS — M1611 Unilateral primary osteoarthritis, right hip: Secondary | ICD-10-CM | POA: Diagnosis not present

## 2023-05-30 DIAGNOSIS — E785 Hyperlipidemia, unspecified: Secondary | ICD-10-CM | POA: Diagnosis not present

## 2023-05-30 DIAGNOSIS — F419 Anxiety disorder, unspecified: Secondary | ICD-10-CM | POA: Diagnosis not present

## 2023-05-30 DIAGNOSIS — M810 Age-related osteoporosis without current pathological fracture: Secondary | ICD-10-CM | POA: Diagnosis not present

## 2023-05-30 DIAGNOSIS — M25551 Pain in right hip: Secondary | ICD-10-CM | POA: Diagnosis not present

## 2023-05-30 DIAGNOSIS — I129 Hypertensive chronic kidney disease with stage 1 through stage 4 chronic kidney disease, or unspecified chronic kidney disease: Secondary | ICD-10-CM | POA: Diagnosis not present

## 2023-05-30 DIAGNOSIS — Z96653 Presence of artificial knee joint, bilateral: Secondary | ICD-10-CM | POA: Diagnosis not present

## 2023-05-30 DIAGNOSIS — N1832 Chronic kidney disease, stage 3b: Secondary | ICD-10-CM | POA: Diagnosis not present

## 2023-05-30 DIAGNOSIS — H269 Unspecified cataract: Secondary | ICD-10-CM | POA: Diagnosis not present

## 2023-05-30 DIAGNOSIS — Z79899 Other long term (current) drug therapy: Secondary | ICD-10-CM | POA: Diagnosis not present

## 2023-05-30 DIAGNOSIS — Z7982 Long term (current) use of aspirin: Secondary | ICD-10-CM | POA: Diagnosis not present

## 2023-05-30 DIAGNOSIS — D649 Anemia, unspecified: Secondary | ICD-10-CM | POA: Diagnosis not present

## 2023-05-30 DIAGNOSIS — M169 Osteoarthritis of hip, unspecified: Secondary | ICD-10-CM | POA: Diagnosis not present

## 2023-05-30 DIAGNOSIS — E46 Unspecified protein-calorie malnutrition: Secondary | ICD-10-CM | POA: Diagnosis not present

## 2023-05-30 DIAGNOSIS — F32A Depression, unspecified: Secondary | ICD-10-CM | POA: Diagnosis not present

## 2023-05-30 DIAGNOSIS — R32 Unspecified urinary incontinence: Secondary | ICD-10-CM | POA: Diagnosis not present

## 2023-05-30 DIAGNOSIS — R0609 Other forms of dyspnea: Secondary | ICD-10-CM | POA: Diagnosis not present

## 2023-05-30 DIAGNOSIS — Z471 Aftercare following joint replacement surgery: Secondary | ICD-10-CM | POA: Diagnosis not present

## 2023-05-30 DIAGNOSIS — Z96641 Presence of right artificial hip joint: Secondary | ICD-10-CM | POA: Diagnosis not present

## 2023-05-30 LAB — BASIC METABOLIC PANEL WITH GFR
Anion gap: 10 (ref 5–15)
BUN: 24 mg/dL — ABNORMAL HIGH (ref 8–23)
CO2: 25 mmol/L (ref 22–32)
Calcium: 9.7 mg/dL (ref 8.9–10.3)
Chloride: 101 mmol/L (ref 98–111)
Creatinine, Ser: 1.46 mg/dL — ABNORMAL HIGH (ref 0.44–1.00)
GFR, Estimated: 36 mL/min — ABNORMAL LOW (ref >60)
Glucose, Bld: 125 mg/dL — ABNORMAL HIGH (ref 70–99)
Potassium: 4.2 mmol/L (ref 3.5–5.1)
Sodium: 136 mmol/L (ref 135–145)

## 2023-05-30 LAB — CBC
HCT: 28.5 % — ABNORMAL LOW (ref 36.0–46.0)
Hemoglobin: 9.8 g/dL — ABNORMAL LOW (ref 12.0–15.0)
MCH: 30.5 pg (ref 26.0–34.0)
MCHC: 34.4 g/dL (ref 30.0–36.0)
MCV: 88.8 fL (ref 80.0–100.0)
Platelets: 162 10*3/uL (ref 150–400)
RBC: 3.21 MIL/uL — ABNORMAL LOW (ref 3.87–5.11)
RDW: 14.5 % (ref 11.5–15.5)
WBC: 7.1 10*3/uL (ref 4.0–10.5)
nRBC: 0 % (ref 0.0–0.2)

## 2023-05-30 MED ORDER — ASPIRIN 81 MG PO CHEW: 81.0000 mg | CHEWABLE_TABLET | Freq: Two times a day (BID) | ORAL | 0 refills | Status: AC

## 2023-05-30 MED ORDER — METHOCARBAMOL 500 MG PO TABS: 500.0000 mg | ORAL_TABLET | Freq: Four times a day (QID) | ORAL | 0 refills | Status: DC | PRN

## 2023-05-30 MED ORDER — OXYCODONE HCL 5 MG PO TABS: 5.0000 mg | ORAL_TABLET | ORAL | 0 refills | Status: DC | PRN

## 2023-05-30 NOTE — Care Management Obs Status (Signed)
 MEDICARE OBSERVATION STATUS NOTIFICATION   Patient Details  Name: Kelly [name] MRN: [id number] Date of Birth: Aug 06, 1945   Medicare Observation Status Notification Given:  Yes    Omie Bickers, RN 05/30/2023, 10:35 AM

## 2023-05-30 NOTE — Progress Notes (Signed)
 Subjective: 1 Day Post-Op Procedure(s) (LRB): RIGHT TOTAL HIP ARTHOPLASTY (Right) Patient reports pain as moderate.    Objective: Vital signs in last 24 hours: Temp:  [94.1 F (34.5 C)-98.9 F (37.2 C)] 98.9 F (37.2 C) (04/16 0519) Pulse Rate:  [47-71] 69 (04/16 0519) Resp:  [15-24] 16 (04/16 0519) BP: (82-127)/(54-82) 127/69 (04/16 0519) SpO2:  [93 %-100 %] 97 % (04/16 0519)  Intake/Output from previous day: 04/15 0701 - 04/16 0700 In: 996.6 [I.V.:796.6; IV Piggyback:200] Out: 500 [Urine:250; Blood:250] Intake/Output this shift: No intake/output data recorded.  Recent Labs    05/30/23 0619  HGB 9.8*   Recent Labs    05/30/23 0619  WBC 7.1  RBC 3.21*  HCT 28.5*  PLT 162   Recent Labs    05/30/23 0619  NA 136  K 4.2  CL 101  CO2 25  BUN 24*  CREATININE 1.46*  GLUCOSE 125*  CALCIUM 9.7   No results for input(s): "LABPT", "INR" in the last 72 hours.  Sensation intact distally Intact pulses distally Dorsiflexion/Plantar flexion intact Incision: scant drainage   Assessment/Plan: 1 Day Post-Op Procedure(s) (LRB): RIGHT TOTAL HIP ARTHOPLASTY (Right) Up with therapy Discharge to SNF when bed available      Kelly [name] 05/30/2023, 7:47 AM

## 2023-05-30 NOTE — Progress Notes (Signed)
 Physical Therapy Treatment Patient Details Name: Kelly [name] MRN: [id number] DOB: July 31, 1945 Today's Date: 05/30/2023   History of Present Illness 78 y.o. female presents to [facility] hospital on 05/29/2023 for elective R THA. PMH includes HTN, HLD, OA, anxiety, depression, CKD III, DMII.    PT Comments  Pt supine in bed on arrival this session.  Pt continues to benefit from skilled rehab in a post acute setting due to functional decline but making appropriate progress given surgery date.  Pt resting in chair post session awaiting d/c to snf today.    If plan is discharge home, recommend the following: A little help with walking and/or transfers;A lot of help with bathing/dressing/bathroom;Assistance with cooking/housework;Assist for transportation;Help with stairs or ramp for entrance   Can travel by private vehicle     Yes  Equipment Recommendations  None recommended by PT    Recommendations for Other Services       Precautions / Restrictions Precautions Precautions: Fall Recall of Precautions/Restrictions: Intact Precaution/Restrictions Comments: direct anterior THA Restrictions Weight Bearing Restrictions Per Provider Order: Yes RLE Weight Bearing Per Provider Order: Weight bearing as tolerated     Mobility  Bed Mobility Overal bed mobility: Needs Assistance Bed Mobility: Rolling, Supine to Sit, Sit to Supine     Supine to sit: Min assist     General bed mobility comments: Min assistance to utilize hooking method to move to edge of bed.  Heavy use of rails and increased time.    Transfers Overall transfer level: Needs assistance Equipment used: Rolling walker (2 wheels) Transfers: Sit to/from Stand Sit to Stand: Contact guard assist, Min assist           General transfer comment: Cues for hand placement to push from seated surface vs reaching and pulling on RW.  Pt required increased assistance to stand from lower seated commode.     Ambulation/Gait Ambulation/Gait assistance: Contact guard assist Gait Distance (Feet): 50 Feet Assistive device: Rolling walker (2 wheels) Gait Pattern/deviations: Step-to pattern, Wide base of support, Trunk flexed Gait velocity: reduced     General Gait Details: Cues for posture and forward gaze.  Better ability to stay in device for safety.   Stairs             Wheelchair Mobility     Tilt Bed    Modified Rankin (Stroke Patients Only)       Balance Overall balance assessment: Needs assistance Sitting-balance support: No upper extremity supported, Feet supported Sitting balance-Leahy Scale: Good       Standing balance-Leahy Scale: Poor                              Communication Communication Communication: No apparent difficulties  Cognition Arousal: Alert Behavior During Therapy: WFL for tasks assessed/performed                             Following commands: Intact      Cueing Cueing Techniques: Verbal cues  Exercises General Exercises - Lower Extremity Ankle Circles/Pumps: AROM, Both, 10 reps, Supine Quad Sets: AROM, Both, 10 reps, Supine Heel Slides: AROM, Left, 10 reps, Supine    General Comments        Pertinent Vitals/Pain Pain Assessment Pain Assessment: Faces Faces Pain Scale: Hurts even more Pain Location: R hip with movement Pain Descriptors / Indicators: Sore Pain Intervention(s): Repositioned    Home Living  Prior Function            PT Goals (current goals can now be found in the care plan section) Acute Rehab PT Goals Patient Stated Goal: to return to independence Potential to Achieve Goals: Good Progress towards PT goals: Progressing toward goals    Frequency    7X/week      PT Plan      Co-evaluation              AM-PAC PT "6 Clicks" Mobility   Outcome Measure  Help needed turning from your back to your side while in a flat bed without  using bedrails?: A Little Help needed moving from lying on your back to sitting on the side of a flat bed without using bedrails?: A Little Help needed moving to and from a bed to a chair (including a wheelchair)?: A Little Help needed standing up from a chair using your arms (e.g., wheelchair or bedside chair)?: A Little Help needed to walk in hospital room?: A Little Help needed climbing 3-5 steps with a railing? : Total 6 Click Score: 16    End of Session Equipment Utilized During Treatment: Gait belt Activity Tolerance: Patient tolerated treatment well Patient left: in bed;with call bell/phone within reach;with bed alarm set Nurse Communication: Mobility status PT Visit Diagnosis: Other abnormalities of gait and mobility (R26.89);Muscle weakness (generalized) (M62.81);Pain Pain - Right/Left: Right Pain - part of body: Hip     Time: 1224-1254 PT Time Calculation (min) (ACUTE ONLY): 30 min  Charges:    $Gait Training: 8-22 mins $Therapeutic Activity: 8-22 mins PT General Charges $$ ACUTE PT VISIT: 1 Visit                     Beulah Brunt , PTA Acute Rehabilitation Services Office [phone]    Reynolds Cea 05/30/2023, 12:56 PM

## 2023-05-30 NOTE — Discharge Instructions (Signed)

## 2023-05-30 NOTE — Discharge Summary (Signed)
 Patient ID: Kelly [name] MRN: [id number] DOB/AGE: [birth year] 78 y.o.  Admit date: 05/29/2023 Discharge date: 05/30/2023  Admission Diagnoses:  Principal Problem:   Unilateral primary osteoarthritis, right hip Active Problems:   OA (osteoarthritis) of hip   Status post total replacement of right hip   Discharge Diagnoses:  Same  Past Medical History:  Diagnosis Date   Arthritis    Cataract    Chronic kidney disease    Hyperlipidemia    Hypertension    Osteoporosis    Shortness of breath    With activity    Surgeries: Procedure(s): RIGHT TOTAL HIP ARTHOPLASTY on 05/29/2023   Consultants:   Discharged Condition: Improved  Hospital Course: HOLLY IANNACCONE is an 78 y.o. female who was admitted 05/29/2023 for operative treatment ofUnilateral primary osteoarthritis, right hip. Patient has severe unremitting pain that affects sleep, daily activities, and work/hobbies. After pre-op clearance the patient was taken to the operating room on 05/29/2023 and underwent  Procedure(s): RIGHT TOTAL HIP ARTHOPLASTY.    Patient was given perioperative antibiotics:  Anti-infectives (From admission, onward)    Start     Dose/Rate Route Frequency Ordered Stop   05/29/23 1800  ceFAZolin (ANCEF) IVPB 2g/100 mL premix        2 g 200 mL/hr over 30 Minutes Intravenous Every 12 hours 05/29/23 1214 05/30/23 0534   05/29/23 0630  ceFAZolin (ANCEF) IVPB 2g/100 mL premix        2 g 200 mL/hr over 30 Minutes Intravenous On call to O.R. 05/29/23 0865 05/29/23 0750   05/29/23 0618  ceFAZolin (ANCEF) 2-4 GM/100ML-% IVPB       Note to Pharmacy: Hamp Levine N: cabinet override      05/29/23 0618 05/29/23 0758        Patient was given sequential compression devices, early ambulation, and chemoprophylaxis to prevent DVT.  Patient benefited maximally from hospital stay and there were no complications.    Recent vital signs: Patient Vitals for the past 24 hrs:  BP Temp Temp src Pulse Resp  SpO2  05/30/23 0814 128/71 98.4 F (36.9 C) Oral 69 18 97 %  05/30/23 0519 127/69 98.9 F (37.2 C) -- 69 16 97 %  05/29/23 2045 115/72 97.8 F (36.6 C) Oral 69 16 99 %  05/29/23 1545 120/70 98.1 F (36.7 C) Oral 71 17 99 %  05/29/23 1211 (!) 108/57 97.9 F (36.6 C) Oral 65 17 94 %  05/29/23 1200 107/64 98.3 F (36.8 C) -- 65 15 93 %  05/29/23 1147 -- 98.3 F (36.8 C) -- 63 18 94 %  05/29/23 1145 100/82 (!) 97.2 F (36.2 C) -- 62 19 95 %  05/29/23 1130 112/67 (!) 96.8 F (36 C) -- 62 15 95 %  05/29/23 1115 112/67 (!) 96.3 F (35.7 C) -- 61 16 98 %  05/29/23 1103 -- (!) 95.9 F (35.5 C) -- 62 (!) 24 100 %  05/29/23 1100 119/69 (!) 95.9 F (35.5 C) -- 61 (!) 21 99 %  05/29/23 1045 119/62 (!) 95.5 F (35.3 C) -- 60 17 100 %  05/29/23 1030 98/60 (!) 95.2 F (35.1 C) -- (!) 53 (!) 23 100 %  05/29/23 1015 (!) 115/58 (!) 95 F (35 C) -- (!) 52 (!) 22 98 %  05/29/23 1000 (!) 82/54 (!) 94.6 F (34.8 C) -- (!) 47 19 100 %  05/29/23 0945 108/60 -- -- 61 (!) 24 100 %  05/29/23 0930 115/74 -- -- 61 16 100 %  Recent laboratory studies:  Recent Labs    05/30/23 0619  WBC 7.1  HGB 9.8*  HCT 28.5*  PLT 162  NA 136  K 4.2  CL 101  CO2 25  BUN 24*  CREATININE 1.46*  GLUCOSE 125*  CALCIUM 9.7     Discharge Medications:   Allergies as of 05/30/2023   No Known Allergies      Medication List     STOP taking these medications    aspirin EC 81 MG tablet Replaced by: aspirin 81 MG chewable tablet       TAKE these medications    amLODipine 10 MG tablet Commonly known as: NORVASC Take 1 tablet (10 mg total) by mouth daily.   aspirin 81 MG chewable tablet Chew 1 tablet (81 mg total) by mouth 2 (two) times daily. Replaces: aspirin EC 81 MG tablet   methocarbamol 500 MG tablet Commonly known as: ROBAXIN Take 1 tablet (500 mg total) by mouth every 6 (six) hours as needed for muscle spasms.   oxyCODONE 5 MG immediate release tablet Commonly known as: Oxy  IR/ROXICODONE Take 1 tablet (5 mg total) by mouth every 4 (four) hours as needed for moderate pain (pain score 4-6) (pain score 4-6).   Repatha SureClick 140 MG/ML Soaj Generic drug: Evolocumab Inject 140 mg into the skin every 14 (fourteen) days.   rosuvastatin 20 MG tablet Commonly known as: CRESTOR Take 1 tablet (20 mg total) by mouth daily.   WOMENS 50+ MULTI VITAMIN PO Take 1 tablet by mouth daily at 6 (six) AM.               Durable Medical Equipment  (From admission, onward)           Start     Ordered   05/29/23 1215  DME 3 n 1  Once        05/29/23 1214   05/29/23 1215  DME Walker rolling  Once       Question Answer Comment  Walker: With 5 Inch Wheels   Patient needs a walker to treat with the following condition Status post total replacement of right hip      05/29/23 1214            Diagnostic Studies: DG Pelvis Portable Result Date: 05/29/2023 CLINICAL DATA:  Status post right hip replacement. EXAM: PORTABLE PELVIS 1-2 VIEWS COMPARISON:  None Available. FINDINGS: Right hip arthroplasty in expected alignment. No periprosthetic lucency or fracture. Recent postsurgical change includes air and edema in the soft tissues. Overlying skin staples in place. IMPRESSION: Right hip arthroplasty without immediate postoperative complication. Electronically Signed   By: Narda Rutherford M.D.   On: 05/29/2023 10:25   DG HIP UNILAT WITH PELVIS 1V RIGHT Result Date: 05/29/2023 CLINICAL DATA:  Right total hip arthroplasty. EXAM: OPERATIVE RIGHT HIP (WITH PELVIS IF PERFORMED) 3 VIEWS TECHNIQUE: Fluoroscopic spot image(s) were submitted for interpretation post-operatively. COMPARISON:  Right hip radiographs dated 02/12/2023. FINDINGS: Multiple intraoperative fluoroscopic spot images are provided. Interval right total hip arthroplasty. Normal alignment. FLUOROSCOPY TIME:  Total fluoroscopy time: 18.3 seconds Total radiation dose: 2.17 mGy IMPRESSION: Multiple intraoperative  fluoroscopic spot images of interval right total hip arthroplasty. Electronically Signed   By: Hart Robinsons M.D.   On: 05/29/2023 08:57   DG C-Arm 1-60 Min-No Report Result Date: 05/29/2023 Fluoroscopy was utilized by the requesting physician.  No radiographic interpretation.    Disposition: Discharge disposition: 03-Skilled Nursing Facility  Follow-up Information     Arnie Lao, MD Follow up in 2 week(s).   Specialty: Orthopedic Surgery Contact information: [address] [place]  [place] [address] [phone]                  Signed: Arnie Lao 05/30/2023, 9:24 AM

## 2023-05-30 NOTE — Care Management CC44 (Signed)
 Condition Code 44 Documentation Completed  Patient Details  Name: Kelly [name] MRN: [id number] Date of Birth: October 01, 1945   Condition Code 44 given:  Yes Patient signature on Condition Code 44 notice:  Yes Documentation of 2 MD's agreement:  Yes Code 44 added to claim:  Yes    Omie Bickers, RN 05/30/2023, 10:35 AM

## 2023-05-30 NOTE — TOC Transition Note (Signed)
 Transition of Care [facility]) - Discharge Note   Patient Details  Name: Kelly [name] MRN: [id number] Date of Birth: 07/31/1945  Transition of [facility]) CM/SW Contact:  Elspeth Hals, LCSW Phone Number: 05/30/2023, 12:52 PM   Clinical Narrative:   Pt discharging to [facility] room 206.  RN call report to [phone].  Pt will be transported by friend.  Will need to be brought down to main north tower entrance with help getting into the vehicle.    1030: CSW spoke with pt regarding transportation.  She has a friend who can transport and will contact her.  CSW confirmed with [name] that they can receive pt today.  1100: CSW confirmed with PT that they can complete another session today, as per pt agreement with MD.  Final next level of care: Skilled Nursing Facility Barriers to Discharge: Barriers Resolved   Patient Goals and CMS Choice Patient states their goals for this hospitalization and ongoing recovery are:: back to normal   Choice offered to / list presented to : Patient (requesting [facility])      Discharge Placement              Patient chooses bed at: [facility] [place] Patient to be transferred to facility by: friend [name] Name of family member notified: unable to leave message with friend [name] Patient and family notified of of transfer: 05/30/23  Discharge Plan and Services Additional resources added to the After Visit Summary for   In-house Referral: Clinical Social Work   Post Acute Care Choice: Skilled Nursing Facility                               Social Drivers of Health (SDOH) Interventions SDOH Screenings   Food Insecurity: No Food Insecurity (05/29/2023)  Housing: Low Risk  (05/29/2023)  Transportation Needs: No Transportation Needs (05/29/2023)  Utilities: Not At Risk (05/29/2023)  Depression (PHQ2-9): Low Risk  (12/14/2022)  Financial Resource Strain: Low Risk  (12/14/2022)  Physical Activity: Sufficiently Active  (12/14/2022)  Social Connections: Moderately Isolated (05/29/2023)  Stress: No Stress Concern Present (12/14/2022)  Tobacco Use: Low Risk  (05/29/2023)  Health Literacy: Adequate Health Literacy (12/14/2022)     Readmission Risk Interventions     No data to display

## 2023-05-30 NOTE — Plan of Care (Signed)

## 2023-05-30 NOTE — Progress Notes (Signed)
 Report given to Odilia Bennett RN at [facility] and [facility] and given to [facility]. d/c paperwork given to patient. Pt will be transported to [facility] by friend.

## 2023-05-31 DIAGNOSIS — Z79899 Other long term (current) drug therapy: Secondary | ICD-10-CM | POA: Diagnosis not present

## 2023-06-02 DIAGNOSIS — N1832 Chronic kidney disease, stage 3b: Secondary | ICD-10-CM | POA: Diagnosis not present

## 2023-06-02 DIAGNOSIS — M1611 Unilateral primary osteoarthritis, right hip: Secondary | ICD-10-CM | POA: Diagnosis not present

## 2023-06-02 DIAGNOSIS — D649 Anemia, unspecified: Secondary | ICD-10-CM | POA: Diagnosis not present

## 2023-06-02 DIAGNOSIS — Z96641 Presence of right artificial hip joint: Secondary | ICD-10-CM | POA: Diagnosis not present

## 2023-06-02 DIAGNOSIS — E785 Hyperlipidemia, unspecified: Secondary | ICD-10-CM | POA: Diagnosis not present

## 2023-06-02 DIAGNOSIS — E46 Unspecified protein-calorie malnutrition: Secondary | ICD-10-CM | POA: Diagnosis not present

## 2023-06-07 ENCOUNTER — Other Ambulatory Visit: Payer: Federal, State, Local not specified - PPO

## 2023-06-07 DIAGNOSIS — N1831 Chronic kidney disease, stage 3a: Secondary | ICD-10-CM

## 2023-06-07 DIAGNOSIS — E7439 Other disorders of intestinal carbohydrate absorption: Secondary | ICD-10-CM

## 2023-06-07 DIAGNOSIS — Z1329 Encounter for screening for other suspected endocrine disorder: Secondary | ICD-10-CM

## 2023-06-07 DIAGNOSIS — I10 Essential (primary) hypertension: Secondary | ICD-10-CM

## 2023-06-07 DIAGNOSIS — E119 Type 2 diabetes mellitus without complications: Secondary | ICD-10-CM

## 2023-06-11 ENCOUNTER — Encounter: Payer: Self-pay | Admitting: Orthopaedic Surgery

## 2023-06-11 ENCOUNTER — Ambulatory Visit (INDEPENDENT_AMBULATORY_CARE_PROVIDER_SITE_OTHER): Admitting: Orthopaedic Surgery

## 2023-06-11 DIAGNOSIS — Z96641 Presence of right artificial hip joint: Secondary | ICD-10-CM

## 2023-06-11 NOTE — Progress Notes (Signed)
 The patient is here for her first postoperative visit status post a right total hip replacement.  She is 78 years old and active.  She has been recovering in a skilled nursing facility since she has no one at home.  She is actually scheduled to be released to home tomorrow.  She is very pleased so far with how her hip is coming along.  Her right hip incision looks good.  Staples are removed and Steri-Strips applied.  Her calf is soft.  She is mobilizing very well.  From our standpoint she can go home with some home health therapy if she needs it.  We will see her back in a month to see how she is doing overall but no x-rays are needed.

## 2023-06-13 DIAGNOSIS — Z96641 Presence of right artificial hip joint: Secondary | ICD-10-CM | POA: Diagnosis not present

## 2023-06-13 DIAGNOSIS — Z6838 Body mass index (BMI) 38.0-38.9, adult: Secondary | ICD-10-CM | POA: Diagnosis not present

## 2023-06-13 DIAGNOSIS — I129 Hypertensive chronic kidney disease with stage 1 through stage 4 chronic kidney disease, or unspecified chronic kidney disease: Secondary | ICD-10-CM | POA: Diagnosis not present

## 2023-06-13 DIAGNOSIS — N1832 Chronic kidney disease, stage 3b: Secondary | ICD-10-CM | POA: Diagnosis not present

## 2023-06-13 DIAGNOSIS — Z471 Aftercare following joint replacement surgery: Secondary | ICD-10-CM | POA: Diagnosis not present

## 2023-06-13 DIAGNOSIS — Z7982 Long term (current) use of aspirin: Secondary | ICD-10-CM | POA: Diagnosis not present

## 2023-06-13 DIAGNOSIS — Z79899 Other long term (current) drug therapy: Secondary | ICD-10-CM | POA: Diagnosis not present

## 2023-06-13 DIAGNOSIS — R32 Unspecified urinary incontinence: Secondary | ICD-10-CM | POA: Diagnosis not present

## 2023-06-13 DIAGNOSIS — E43 Unspecified severe protein-calorie malnutrition: Secondary | ICD-10-CM | POA: Diagnosis not present

## 2023-06-13 DIAGNOSIS — E785 Hyperlipidemia, unspecified: Secondary | ICD-10-CM | POA: Diagnosis not present

## 2023-06-13 DIAGNOSIS — Z604 Social exclusion and rejection: Secondary | ICD-10-CM | POA: Diagnosis not present

## 2023-06-13 DIAGNOSIS — D62 Acute posthemorrhagic anemia: Secondary | ICD-10-CM | POA: Diagnosis not present

## 2023-06-13 DIAGNOSIS — K5900 Constipation, unspecified: Secondary | ICD-10-CM | POA: Diagnosis not present

## 2023-06-13 DIAGNOSIS — R252 Cramp and spasm: Secondary | ICD-10-CM | POA: Diagnosis not present

## 2023-06-13 DIAGNOSIS — D631 Anemia in chronic kidney disease: Secondary | ICD-10-CM | POA: Diagnosis not present

## 2023-06-13 DIAGNOSIS — Z96653 Presence of artificial knee joint, bilateral: Secondary | ICD-10-CM | POA: Diagnosis not present

## 2023-06-13 DIAGNOSIS — E663 Overweight: Secondary | ICD-10-CM | POA: Diagnosis not present

## 2023-06-13 DIAGNOSIS — M810 Age-related osteoporosis without current pathological fracture: Secondary | ICD-10-CM | POA: Diagnosis not present

## 2023-06-13 DIAGNOSIS — F1090 Alcohol use, unspecified, uncomplicated: Secondary | ICD-10-CM | POA: Diagnosis not present

## 2023-06-13 LAB — LIPID PANEL
Chol/HDL Ratio: 3.4 ratio (ref 0.0–4.4)
Cholesterol, Total: 95 mg/dL — ABNORMAL LOW (ref 100–199)
HDL: 28 mg/dL — ABNORMAL LOW (ref >39)
LDL Chol Calc (NIH): 46 mg/dL (ref 0–99)
Triglycerides: 113 mg/dL (ref 0–149)
VLDL Cholesterol Cal: 21 mg/dL (ref 5–40)

## 2023-06-13 LAB — ALT: ALT: 10 IU/L (ref 0–32)

## 2023-06-13 NOTE — Progress Notes (Addendum)
 Patient Care Team: Sylvan Evener, MD as PCP - General Abel Hoe Coy Ditty, MD as PCP - Cardiology (Cardiology)  Visit Date: 06/25/23  Subjective:   Chief Complaint  Patient presents with   Medical Management of Chronic Issues  Patient YQ:MVHQIO Kelly [name],Female DOB:August 07, 1945,77 y.o. NGE:[id number]   78 y.o. Female presents today for 6 months follow-up for Hypertension; Hyperlipidemia; Diabetes Mellitus II; Chronic Kidney Disease, stage III. Patient has a past medical history of SOBOE; Overactive Bladder. Seen for her annual on 12/14/2022, in the interim has been seen Cardiology x3, Chiropractic Medicine, Orthopedics x5, Colonoscopy 03/08/2023, and Urology.   History of Hypertension treated with Amlodipine  10 mg daily. Blood Pressure: normotensive today at 120/80.   History of Hyperlipidemia treated with  Rosuvastatin  20 mg daily and Repatha  140 mg fortnightly. 06/13/2023 Lipid Panel, compared to 12/08/2022: Cholesterol 95, decreased from 180; HDL 28, decreased from 37; otherwise WNL.   History of Diabetes Mellitus, type II with 06/19/2023 HgbA1c 5.4. Eye exam on 11/01/2022.   Chronic Kidney Disease, stage III followed by [facility].   S/p Total Hip Replacement, Right by Dr. Lucienne Ryder on 4/15. Says that this has been an ordeal. Past Medical History:  Diagnosis Date   Arthritis    Cataract    Chronic kidney disease    Hyperlipidemia    Hypertension    Osteoporosis    Shortness of breath    With activity  No Known Allergies  Family History  Problem Relation Age of Onset   Heart disease Mother    Alcohol abuse Father    Hypertension Father    Cancer Sister    Hypertension Sister    Breast cancer Neg Hx    Colon cancer Neg Hx    Colon polyps Neg Hx    Esophageal cancer Neg Hx    Stomach cancer Neg Hx    Rectal cancer Neg Hx    Social Hx: resides alone. Divorced. Does not smoke. Social alcohol consumption  Review of Systems  Constitutional:  Negative  for fever and malaise/fatigue.  HENT:  Negative for congestion.   Eyes:  Negative for blurred vision.  Respiratory:  Negative for cough and shortness of breath.   Cardiovascular:  Negative for chest pain, palpitations and leg swelling.  Gastrointestinal:  Negative for vomiting.  Musculoskeletal:  Negative for back pain.  Skin:  Negative for rash.  Neurological:  Negative for loss of consciousness and headaches.     Objective:  Vitals: BP 120/80   Pulse 67   Ht 5' (1.524 m)   Wt 184 lb (83.5 kg)   SpO2 99%   BMI 35.94 kg/m   Physical Exam Vitals and nursing note reviewed.  Constitutional:      General: She is not in acute distress.    Appearance: Normal appearance. She is not toxic-appearing.  HENT:     Head: Normocephalic and atraumatic.  Cardiovascular:     Rate and Rhythm: Normal rate and regular rhythm. No extrasystoles are present.    Pulses: Normal pulses.     Heart sounds: Normal heart sounds. No murmur heard.    No friction rub. No gallop.  Pulmonary:     Effort: Pulmonary effort is normal. No respiratory distress.     Breath sounds: Normal breath sounds. No wheezing or rales.  Musculoskeletal:     Right lower leg: 1+ Edema (trace) present.     Left lower leg: Edema (trace) present.  Skin:    General: Skin is warm and  dry.  Neurological:     Mental Status: She is alert and oriented to person, place, and time. Mental status is at baseline.  Psychiatric:        Mood and Affect: Mood normal.        Behavior: Behavior normal.        Thought Content: Thought content normal.        Judgment: Judgment normal.     Results:  Studies Obtained And Personally Reviewed By Me: Labs:     Component Value Date/Time   NA 136 05/30/2023 0619   NA 144 01/18/2023 1506   K 4.2 05/30/2023 0619   CL 101 05/30/2023 0619   CO2 25 05/30/2023 0619   GLUCOSE 125 (H) 05/30/2023 0619   BUN 24 (H) 05/30/2023 0619   BUN 21 01/18/2023 1506   CREATININE 1.46 (H) 05/30/2023 0619    CREATININE 1.40 (H) 12/08/2022 0932   CALCIUM  9.7 05/30/2023 0619   PROT 7.0 12/08/2022 0932   ALBUMIN  4.1 08/29/2022 1510   AST 22 12/08/2022 0932   ALT 10 06/13/2023 0943   ALKPHOS 92 08/29/2022 1510   BILITOT 0.7 12/08/2022 0932   GFRNONAA 36 (L) 05/30/2023 0619   GFRNONAA 34 (L) 11/18/2019 0954   GFRAA 39 (L) 11/18/2019 0954    Lab Results  Component Value Date   WBC 7.1 05/30/2023   HGB 9.8 (L) 05/30/2023   HCT 28.5 (L) 05/30/2023   MCV 88.8 05/30/2023   PLT 162 05/30/2023   Lab Results  Component Value Date   CHOL 95 (L) 06/13/2023   HDL 28 (L) 06/13/2023   LDLCALC 46 06/13/2023   TRIG 113 06/13/2023   CHOLHDL 3.4 06/13/2023   Lab Results  Component Value Date   HGBA1C 5.4 06/19/2023    Lab Results  Component Value Date   TSH 2.42 12/08/2022   Assessment & Plan:  Hypertension treated with Amlodipine  10 mg daily. Blood Pressure: normotensive today at 120/80.   Hyperlipidemia treated with  Rosuvastatin  20 mg daily and Repatha  140 mg fortnightly. 06/13/2023 Lipid Panel, compared to 12/08/2022: Cholesterol 95, decreased from 180; HDL 28, decreased from 37; otherwise WNL.   Diabetes Mellitus, type II with 06/19/2023 HgbA1c 5.4%. Eye exam on 11/01/2022.   Chronic Kidney Disease, stage III followed by [facility].   Hx right knee arthroplasty   S/p Total Hip Replacement, Right by Dr. Lucienne Ryder on 4/15. Says that this has been an ordeal.    Production manager as a scribe for Sylvan Evener, MD.,have documented all relevant documentation on the behalf of Sylvan Evener, MD,as directed by  Sylvan Evener, MD while in the presence of Sylvan Evener, MD.   I, Sylvan Evener, MD, have reviewed all documentation for this visit. The documentation on 07/01/23 for the exam, diagnosis, procedures, and orders are all accurate and complete.

## 2023-06-14 ENCOUNTER — Ambulatory Visit: Payer: Medicare Other | Admitting: Internal Medicine

## 2023-06-14 ENCOUNTER — Encounter: Payer: Self-pay | Admitting: Pharmacist

## 2023-06-15 DIAGNOSIS — I129 Hypertensive chronic kidney disease with stage 1 through stage 4 chronic kidney disease, or unspecified chronic kidney disease: Secondary | ICD-10-CM | POA: Diagnosis not present

## 2023-06-15 DIAGNOSIS — E43 Unspecified severe protein-calorie malnutrition: Secondary | ICD-10-CM | POA: Diagnosis not present

## 2023-06-15 DIAGNOSIS — F1090 Alcohol use, unspecified, uncomplicated: Secondary | ICD-10-CM | POA: Diagnosis not present

## 2023-06-15 DIAGNOSIS — Z471 Aftercare following joint replacement surgery: Secondary | ICD-10-CM | POA: Diagnosis not present

## 2023-06-15 DIAGNOSIS — N1832 Chronic kidney disease, stage 3b: Secondary | ICD-10-CM | POA: Diagnosis not present

## 2023-06-15 DIAGNOSIS — D631 Anemia in chronic kidney disease: Secondary | ICD-10-CM | POA: Diagnosis not present

## 2023-06-19 ENCOUNTER — Other Ambulatory Visit

## 2023-06-19 DIAGNOSIS — R7302 Impaired glucose tolerance (oral): Secondary | ICD-10-CM | POA: Diagnosis not present

## 2023-06-20 DIAGNOSIS — E43 Unspecified severe protein-calorie malnutrition: Secondary | ICD-10-CM | POA: Diagnosis not present

## 2023-06-20 DIAGNOSIS — D631 Anemia in chronic kidney disease: Secondary | ICD-10-CM | POA: Diagnosis not present

## 2023-06-20 DIAGNOSIS — N1832 Chronic kidney disease, stage 3b: Secondary | ICD-10-CM | POA: Diagnosis not present

## 2023-06-20 DIAGNOSIS — I129 Hypertensive chronic kidney disease with stage 1 through stage 4 chronic kidney disease, or unspecified chronic kidney disease: Secondary | ICD-10-CM | POA: Diagnosis not present

## 2023-06-20 DIAGNOSIS — Z471 Aftercare following joint replacement surgery: Secondary | ICD-10-CM | POA: Diagnosis not present

## 2023-06-20 DIAGNOSIS — F1090 Alcohol use, unspecified, uncomplicated: Secondary | ICD-10-CM | POA: Diagnosis not present

## 2023-06-20 LAB — HEMOGLOBIN A1C
Hgb A1c MFr Bld: 5.4 %
Mean Plasma Glucose: 108 mg/dL
eAG (mmol/L): 6 mmol/L

## 2023-06-21 DIAGNOSIS — N1831 Chronic kidney disease, stage 3a: Secondary | ICD-10-CM | POA: Diagnosis not present

## 2023-06-21 DIAGNOSIS — D631 Anemia in chronic kidney disease: Secondary | ICD-10-CM | POA: Diagnosis not present

## 2023-06-21 DIAGNOSIS — N2581 Secondary hyperparathyroidism of renal origin: Secondary | ICD-10-CM | POA: Diagnosis not present

## 2023-06-21 DIAGNOSIS — I129 Hypertensive chronic kidney disease with stage 1 through stage 4 chronic kidney disease, or unspecified chronic kidney disease: Secondary | ICD-10-CM | POA: Diagnosis not present

## 2023-06-22 DIAGNOSIS — E43 Unspecified severe protein-calorie malnutrition: Secondary | ICD-10-CM | POA: Diagnosis not present

## 2023-06-22 DIAGNOSIS — D631 Anemia in chronic kidney disease: Secondary | ICD-10-CM | POA: Diagnosis not present

## 2023-06-22 DIAGNOSIS — F1090 Alcohol use, unspecified, uncomplicated: Secondary | ICD-10-CM | POA: Diagnosis not present

## 2023-06-22 DIAGNOSIS — Z471 Aftercare following joint replacement surgery: Secondary | ICD-10-CM | POA: Diagnosis not present

## 2023-06-22 DIAGNOSIS — I129 Hypertensive chronic kidney disease with stage 1 through stage 4 chronic kidney disease, or unspecified chronic kidney disease: Secondary | ICD-10-CM | POA: Diagnosis not present

## 2023-06-22 DIAGNOSIS — N1832 Chronic kidney disease, stage 3b: Secondary | ICD-10-CM | POA: Diagnosis not present

## 2023-06-25 ENCOUNTER — Encounter: Payer: Self-pay | Admitting: Internal Medicine

## 2023-06-25 ENCOUNTER — Ambulatory Visit (INDEPENDENT_AMBULATORY_CARE_PROVIDER_SITE_OTHER): Admitting: Internal Medicine

## 2023-06-25 VITALS — BP 120/80 | HR 67 | Ht 60.0 in | Wt 184.0 lb

## 2023-06-25 DIAGNOSIS — Z6835 Body mass index (BMI) 35.0-35.9, adult: Secondary | ICD-10-CM | POA: Diagnosis not present

## 2023-06-25 DIAGNOSIS — N1831 Chronic kidney disease, stage 3a: Secondary | ICD-10-CM | POA: Diagnosis not present

## 2023-06-25 DIAGNOSIS — I10 Essential (primary) hypertension: Secondary | ICD-10-CM

## 2023-06-25 DIAGNOSIS — Z96651 Presence of right artificial knee joint: Secondary | ICD-10-CM

## 2023-06-25 DIAGNOSIS — E7439 Other disorders of intestinal carbohydrate absorption: Secondary | ICD-10-CM

## 2023-06-25 DIAGNOSIS — Z96641 Presence of right artificial hip joint: Secondary | ICD-10-CM | POA: Diagnosis not present

## 2023-06-26 LAB — MICROALBUMIN / CREATININE URINE RATIO
Creatinine, Urine: 55 mg/dL (ref 20–275)
Microalb Creat Ratio: 40 mg/g{creat} — ABNORMAL HIGH (ref <30)
Microalb, Ur: 2.2 mg/dL

## 2023-06-27 DIAGNOSIS — D631 Anemia in chronic kidney disease: Secondary | ICD-10-CM | POA: Diagnosis not present

## 2023-06-27 DIAGNOSIS — I129 Hypertensive chronic kidney disease with stage 1 through stage 4 chronic kidney disease, or unspecified chronic kidney disease: Secondary | ICD-10-CM | POA: Diagnosis not present

## 2023-06-27 DIAGNOSIS — N1832 Chronic kidney disease, stage 3b: Secondary | ICD-10-CM | POA: Diagnosis not present

## 2023-06-27 DIAGNOSIS — Z471 Aftercare following joint replacement surgery: Secondary | ICD-10-CM | POA: Diagnosis not present

## 2023-06-27 DIAGNOSIS — E43 Unspecified severe protein-calorie malnutrition: Secondary | ICD-10-CM | POA: Diagnosis not present

## 2023-06-27 DIAGNOSIS — F1090 Alcohol use, unspecified, uncomplicated: Secondary | ICD-10-CM | POA: Diagnosis not present

## 2023-07-01 NOTE — Patient Instructions (Addendum)
 Continue lipid medications as prescribed. Continue amlodipine  for hypertension. Hgb AIC is excellent. Return in December 2025 for medicare wellness visit.

## 2023-07-04 DIAGNOSIS — F1090 Alcohol use, unspecified, uncomplicated: Secondary | ICD-10-CM | POA: Diagnosis not present

## 2023-07-04 DIAGNOSIS — E43 Unspecified severe protein-calorie malnutrition: Secondary | ICD-10-CM | POA: Diagnosis not present

## 2023-07-04 DIAGNOSIS — I129 Hypertensive chronic kidney disease with stage 1 through stage 4 chronic kidney disease, or unspecified chronic kidney disease: Secondary | ICD-10-CM | POA: Diagnosis not present

## 2023-07-04 DIAGNOSIS — Z471 Aftercare following joint replacement surgery: Secondary | ICD-10-CM | POA: Diagnosis not present

## 2023-07-04 DIAGNOSIS — N1832 Chronic kidney disease, stage 3b: Secondary | ICD-10-CM | POA: Diagnosis not present

## 2023-07-04 DIAGNOSIS — D631 Anemia in chronic kidney disease: Secondary | ICD-10-CM | POA: Diagnosis not present

## 2023-07-13 DIAGNOSIS — N3281 Overactive bladder: Secondary | ICD-10-CM | POA: Diagnosis not present

## 2023-07-23 ENCOUNTER — Encounter: Payer: Self-pay | Admitting: Orthopaedic Surgery

## 2023-07-23 ENCOUNTER — Ambulatory Visit (INDEPENDENT_AMBULATORY_CARE_PROVIDER_SITE_OTHER): Admitting: Orthopaedic Surgery

## 2023-07-23 DIAGNOSIS — Z96641 Presence of right artificial hip joint: Secondary | ICD-10-CM

## 2023-07-23 NOTE — Progress Notes (Signed)
 The patient is now just over 6 weeks status post a right hip replacement.  She has had her knees replaced and revised in the past.  She is doing well overall.  She is walking without assistive device.  She is planning to move to [facility] soon but will still see her local physicians here.  On exam she is walking with a much more normal gait.  Her right hip moves smoothly and fluidly with no blocks or rotation and no significant pain or discomfort.  From our standpoint we will see her back in 6 months unless she is having issues.  At that visit we will have a standing AP pelvis and lateral of her right operative hip.

## 2023-07-26 DIAGNOSIS — R350 Frequency of micturition: Secondary | ICD-10-CM | POA: Diagnosis not present

## 2023-07-26 DIAGNOSIS — N3281 Overactive bladder: Secondary | ICD-10-CM | POA: Diagnosis not present

## 2023-08-09 DIAGNOSIS — R350 Frequency of micturition: Secondary | ICD-10-CM | POA: Diagnosis not present

## 2023-08-09 DIAGNOSIS — R351 Nocturia: Secondary | ICD-10-CM | POA: Diagnosis not present

## 2023-08-09 DIAGNOSIS — N3281 Overactive bladder: Secondary | ICD-10-CM | POA: Diagnosis not present

## 2023-08-28 ENCOUNTER — Other Ambulatory Visit: Payer: Self-pay | Admitting: Internal Medicine

## 2023-08-28 DIAGNOSIS — Z1231 Encounter for screening mammogram for malignant neoplasm of breast: Secondary | ICD-10-CM

## 2023-10-04 DIAGNOSIS — Z1283 Encounter for screening for malignant neoplasm of skin: Secondary | ICD-10-CM | POA: Diagnosis not present

## 2023-10-04 DIAGNOSIS — D225 Melanocytic nevi of trunk: Secondary | ICD-10-CM | POA: Diagnosis not present

## 2023-10-09 ENCOUNTER — Ambulatory Visit
Admission: RE | Admit: 2023-10-09 | Discharge: 2023-10-09 | Disposition: A | Discharge status: Home / Self Care | Source: Ambulatory Visit | Attending: Internal Medicine | Admitting: Internal Medicine

## 2023-10-09 DIAGNOSIS — Z1231 Encounter for screening mammogram for malignant neoplasm of breast: Secondary | ICD-10-CM

## 2023-10-31 ENCOUNTER — Telehealth: Payer: Self-pay | Admitting: Cardiovascular Disease

## 2023-10-31 DIAGNOSIS — I2510 Atherosclerotic heart disease of native coronary artery without angina pectoris: Secondary | ICD-10-CM

## 2023-10-31 DIAGNOSIS — E785 Hyperlipidemia, unspecified: Secondary | ICD-10-CM

## 2023-10-31 IMAGING — CR DG CHEST 2V
2 series · 2 of 2 positions shown · non-contrast
Comparison: Chest radiograph 10/06/2011

CLINICAL DATA: Lymphadenopathy of head and neck. Left occipital
node with no cough for 2 weeks.

EXAM:
CHEST - 2 VIEW

[w chest pa]
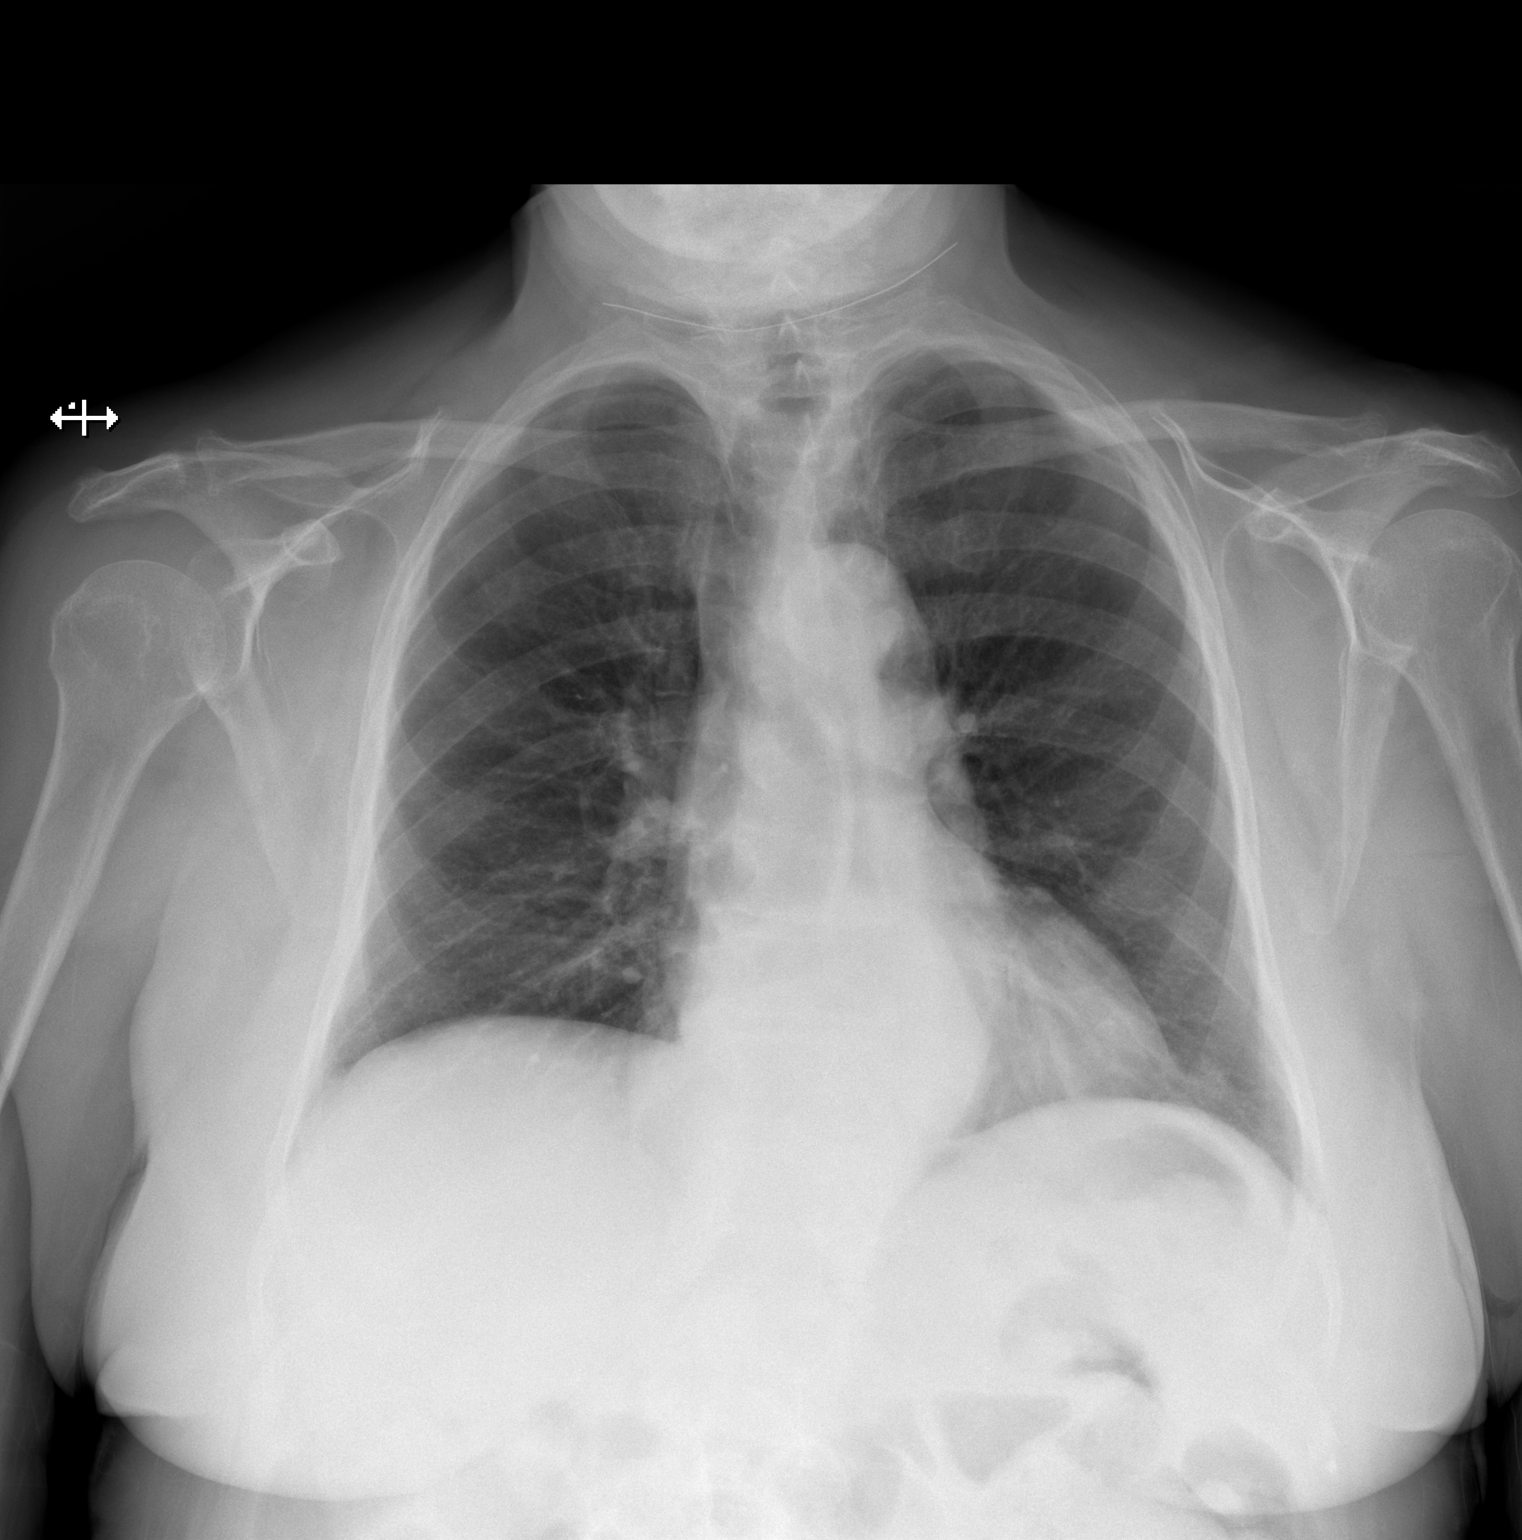

[w chest lat]
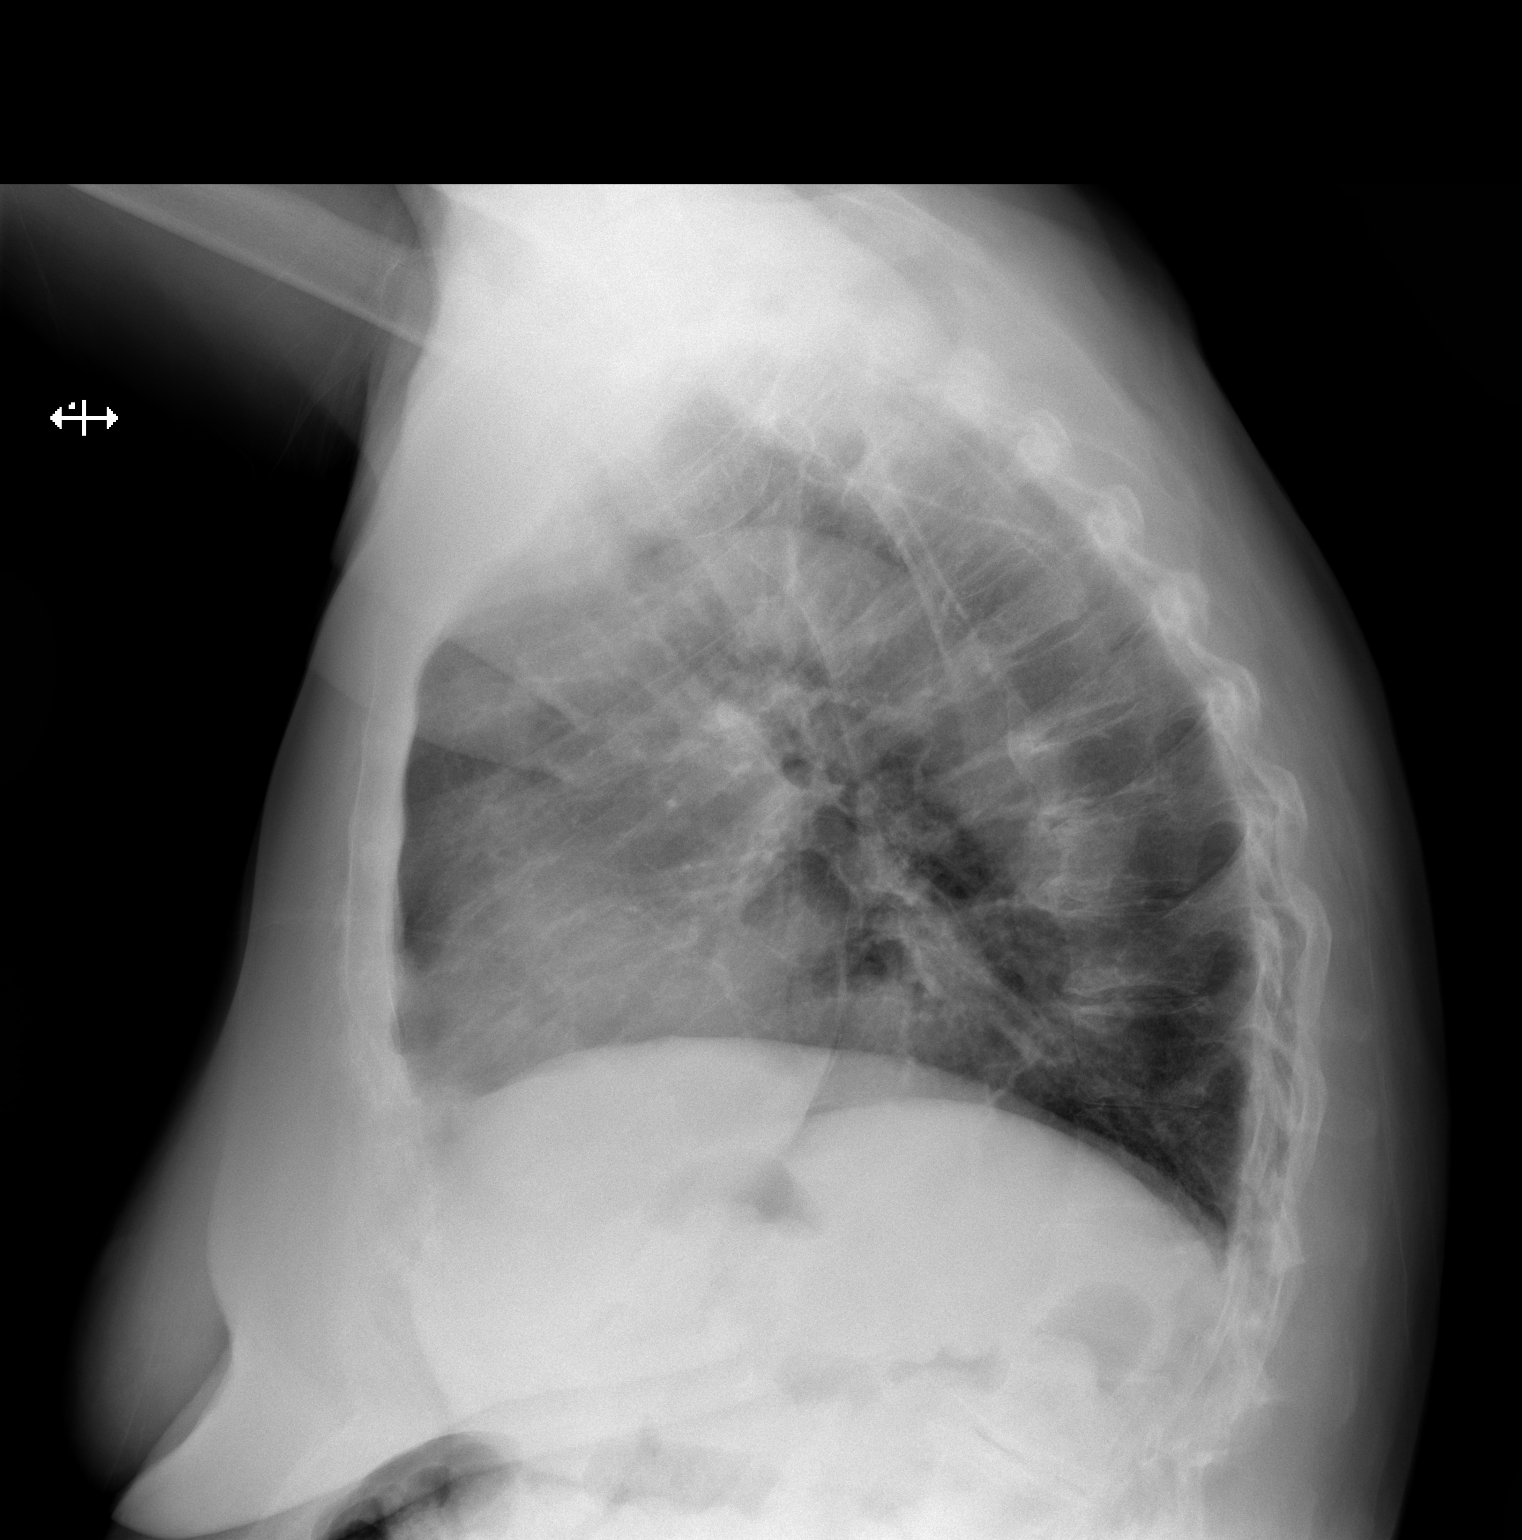

[2 of 2 positions shown; findings below may reference images not displayed]

FINDINGS: The heart is normal in size. No obvious hilar or mediastinal
adenopathy. Increased size of retrocardiac hiatal hernia, now at
least moderate. No focal airspace disease. No pulmonary edema or
pleural effusion. No pneumothorax. No acute osseous abnormalities
are seen.
IMPRESSION: 1. Increased size of retrocardiac hiatal hernia, now at least
moderate in size.
2. No radiographic evidence of mediastinal or hilar adenopathy.

## 2023-10-31 MED ORDER — REPATHA SURECLICK 140 MG/ML ~~LOC~~ SOAJ: 1.0000 mL | SUBCUTANEOUS | 1 refills | Status: AC

## 2023-10-31 NOTE — Telephone Encounter (Signed)
*  STAT* If patient is at the pharmacy, call can be transferred to refill team.   1. Which medications need to be refilled? (please list name of each medication and dose if known) Evolocumab  (REPATHA  SURECLICK) 140 MG/ML SOAJ   2. Which pharmacy/location (including street and city if local pharmacy) is medication to be sent to? [id number] - [place], [place] - [address] AT [facility] [address] Phone: [phone]  Fax: [phone]       Significant History/Details      3. Do they need a 30 day or 90 day supply? 90

## 2023-11-06 DIAGNOSIS — N3281 Overactive bladder: Secondary | ICD-10-CM | POA: Diagnosis not present

## 2023-11-06 DIAGNOSIS — R350 Frequency of micturition: Secondary | ICD-10-CM | POA: Diagnosis not present

## 2023-11-06 DIAGNOSIS — N3941 Urge incontinence: Secondary | ICD-10-CM | POA: Diagnosis not present

## 2023-11-13 ENCOUNTER — Telehealth: Payer: Self-pay | Admitting: Cardiovascular Disease

## 2023-11-13 ENCOUNTER — Telehealth: Payer: Self-pay | Admitting: Pharmacy Technician

## 2023-11-13 NOTE — Telephone Encounter (Signed)
  Her current grant is good until 03/10/24 Card No. [id number]

## 2023-11-13 NOTE — Telephone Encounter (Signed)
 Pt is calling for her grant for Repatha . She states she needs to re enroll. Please Advise

## 2023-11-13 NOTE — Telephone Encounter (Signed)
 Effective: 03/12/23 - 03/10/24   APW:[id number] [id number] Hmnle:[id number] PI:[id number]   Can't re-enroll until 02/08/24

## 2023-11-15 NOTE — Progress Notes (Unsigned)
 No chief complaint on file.  History of Present Illness: 78 yo female with history of CAD, mitral regurgitation, CKD, HLD and HTN who is here today for follow up. She was seen in our office for the first time in December 2023 for the evaluation of dyspnea on exertion. Chest x-ray October 2024 with no active lung disease. She reported dyspnea since she had her knee replacement in December 2023. Cardiac CT December 2024 with moderate LAD stenosis (negative CT FFR), mild RCA stenosis. Calcium  score 1112. Echo December 2024 with LVEF=60-65%. Mild mitral regurgitation.   She is here today for follow up. The patient denies any chest pain, dyspnea, palpitations, lower extremity edema, orthopnea, PND, dizziness, near syncope or syncope.   Primary Care Physician: Perri Ronal PARAS, MD   Past Medical History:  Diagnosis Date   Arthritis    Cataract    Chronic kidney disease    Hyperlipidemia    Hypertension    Osteoporosis    Shortness of breath    With activity    Past Surgical History:  Procedure Laterality Date   ABDOMINAL HYSTERECTOMY     BREAST REDUCTION SURGERY     and lift   BREAST REDUCTION SURGERY Bilateral    CARPAL TUNNEL RELEASE  06/2009   right   CATARACT EXTRACTION     CHOLECYSTECTOMY     FACIAL COSMETIC SURGERY  03/2008   KNEE ARTHROSCOPY  2007   left & right   KNEE CLOSED REDUCTION Right 02/09/2022   Procedure: CLOSED MANIPULATION RIGHT TOTAL KNEE ARTHROPLASTY;  Surgeon: Vernetta Lonni GRADE, MD;  Location: [facility] OR;  Service: Orthopedics;  Laterality: Right;   LIPOSUCTION EXTREMITIES     thighs   PARTIAL KNEE ARTHROPLASTY Right 10/02/2014   Procedure: RIGHT KNEE MEDIAL UNICOMPARTMENTAL ARTHROPLASTY;  Surgeon: Lonni GRADE Vernetta, MD;  Location: [facility];  Service: Orthopedics;  Laterality: Right;   REDUCTION MAMMAPLASTY     bilateral   TOTAL HIP ARTHROPLASTY Right 05/29/2023   Procedure: RIGHT TOTAL HIP ARTHOPLASTY;  Surgeon: Vernetta Lonni GRADE, MD;  Location:  [facility];  Service: Orthopedics;  Laterality: Right;   TOTAL KNEE REVISION  10/12/2011   Procedure: TOTAL KNEE REVISION;  Surgeon: Lonni GRADE Vernetta, MD;  Location: [facility];  Service: Orthopedics;  Laterality: Left;  Left Total Knee Revision Arthroplasty   TOTAL KNEE REVISION Right 12/09/2021   Procedure: CONVERT RIGHT UNI KNEE ARTHROPLASTY TO TOTAL KNEE ARTHROPLASTY;  Surgeon: Vernetta Lonni GRADE, MD;  Location: [facility];  Service: Orthopedics;  Laterality: Right;   TRIGGER FINGER RELEASE  06/2009   right 4th finger    Current Outpatient Medications  Medication Sig Dispense Refill   amLODipine  (NORVASC ) 10 MG tablet Take 1 tablet (10 mg total) by mouth daily. 90 tablet 3   aspirin  81 MG chewable tablet Chew 1 tablet (81 mg total) by mouth 2 (two) times daily. 30 tablet 0   Evolocumab  (REPATHA  SURECLICK) 140 MG/ML SOAJ Inject 140 mg into the skin every 14 (fourteen) days. 6 mL 1   Multiple Vitamins-Minerals (WOMENS 50+ MULTI VITAMIN PO) Take 1 tablet by mouth daily at 6 (six) AM.     rosuvastatin  (CRESTOR ) 20 MG tablet Take 1 tablet (20 mg total) by mouth daily. 90 tablet 3   No current facility-administered medications for this visit.    No Known Allergies  Social History   Socioeconomic History   Marital status: Divorced    Spouse name: Not on file   Number of children: 1  Years of education: Not on file   Highest education level: Associate degree: occupational, Scientist, product/process development, or vocational program  Occupational History   Occupation: Caregiver   Occupation: Retired post office  Tobacco Use   Smoking status: Never   Smokeless tobacco: Never  Vaping Use   Vaping status: Never Used  Substance and Sexual Activity   Alcohol use: Yes    Alcohol/week: 1.0 standard drink of alcohol    Types: 1 Glasses of wine per week    Comment: 1 glass of wine per day   Drug use: No   Sexual activity: Not Currently  Other Topics Concern   Not on file  Social History Narrative   Not on file    Social Drivers of Health   Financial Resource Strain: Low Risk  (06/21/2023)   Overall Financial Resource Strain (CARDIA)    Difficulty of Paying Living Expenses: Not hard at all  Food Insecurity: No Food Insecurity (06/21/2023)   Hunger Vital Sign    Worried About Running Out of Food in the Last Year: Never true    Ran Out of Food in the Last Year: Never true  Transportation Needs: No Transportation Needs (06/21/2023)   PRAPARE - Administrator, Civil Service (Medical): No    Lack of Transportation (Non-Medical): No  Physical Activity: Insufficiently Active (06/21/2023)   Exercise Vital Sign    Days of Exercise per Week: 3 days    Minutes of Exercise per Session: 30 min  Stress: No Stress Concern Present (06/21/2023)   [employer] of Occupational Health - Occupational Stress Questionnaire    Feeling of Stress : Not at all  Social Connections: Moderately Integrated (06/21/2023)   Social Connection and Isolation Panel    Frequency of Communication with Friends and Family: More than three times a week    Frequency of Social Gatherings with Friends and Family: More than three times a week    Attends Religious Services: More than 4 times per year    Active Member of [employer] or Organizations: Yes    Attends Engineer, structural: More than 4 times per year    Marital Status: Divorced  Recent Concern: Social Connections - Moderately Isolated (05/29/2023)   Social Connection and Isolation Panel    Frequency of Communication with Friends and Family: Once a week    Frequency of Social Gatherings with Friends and Family: Once a week    Attends Religious Services: More than 4 times per year    Active Member of [employer] or Organizations: Yes    Attends Engineer, structural: More than 4 times per year    Marital Status: Divorced  Catering manager Violence: Not At Risk (05/29/2023)   Humiliation, Afraid, Rape, and Kick questionnaire    Fear of Current or Ex-Partner: No     Emotionally Abused: No    Physically Abused: No    Sexually Abused: No    Family History  Problem Relation Age of Onset   Heart disease Mother    Alcohol abuse Father    Hypertension Father    Cancer Sister    Hypertension Sister    Breast cancer Neg Hx    Colon cancer Neg Hx    Colon polyps Neg Hx    Esophageal cancer Neg Hx    Stomach cancer Neg Hx    Rectal cancer Neg Hx     Review of Systems:  As stated in the HPI and otherwise negative.   There were no  vitals taken for this visit.  Physical Examination: General: Well developed, well nourished, NAD  HEENT: OP clear, mucus membranes moist  SKIN: warm, dry. No rashes. Neuro: No focal deficits  Musculoskeletal: Muscle strength 5/5 all ext  Psychiatric: Mood and affect normal  Neck: No JVD, no carotid bruits, no thyromegaly, no lymphadenopathy.  Lungs:Clear bilaterally, no wheezes, rhonci, crackles Cardiovascular: Regular rate and rhythm. No murmurs, gallops or rubs. Abdomen:Soft. Bowel sounds present. Non-tender.  Extremities: No lower extremity edema. Pulses are 2 + in the bilateral DP/PT.  EKG:  EKG is not *** ordered today. The ekg ordered today demonstrates   Recent Labs: 12/08/2022: TSH 2.42 05/30/2023: BUN 24; Creatinine, Ser 1.46; Hemoglobin 9.8; Platelets 162; Potassium 4.2; Sodium 136 06/13/2023: ALT 10   Lipid Panel    Component Value Date/Time   CHOL 95 (L) 06/13/2023 0946   TRIG 113 06/13/2023 0946   HDL 28 (L) 06/13/2023 0946   CHOLHDL 3.4 06/13/2023 0946   CHOLHDL 4.9 12/08/2022 0932   VLDL 43 (H) 02/09/2016 1014   LDLCALC 46 06/13/2023 0946   LDLCALC 115 (H) 12/08/2022 0932     Wt Readings from Last 3 Encounters:  06/25/23 184 lb (83.5 kg)  05/29/23 192 lb (87.1 kg)  05/21/23 192 lb (87.1 kg)    Assessment and Plan:   1. CAD without angina: No chest pain. She has moderate LAD disease by cardiac CTA December 2024. Continue ASA, statin and Repatha .   2. Mitral regurgitation: Mild by echo  in December 2024.   Labs/ tests ordered today include:   No orders of the defined types were placed in this encounter.  Disposition:   F/U with me in 12 months  Signed, Lonni Cash, MD, [facility] 11/15/2023 12:23 PM    [facility] [address] [place], [place], [place]  [address] Phone: [phone]; Fax: [phone]

## 2023-11-16 ENCOUNTER — Ambulatory Visit: Attending: Cardiovascular Disease | Admitting: Cardiovascular Disease

## 2023-11-16 ENCOUNTER — Encounter: Payer: Self-pay | Admitting: Cardiovascular Disease

## 2023-11-16 VITALS — BP 122/80 | HR 69 | Resp 16 | Ht 60.0 in | Wt 184.6 lb

## 2023-11-16 DIAGNOSIS — I340 Nonrheumatic mitral (valve) insufficiency: Secondary | ICD-10-CM | POA: Insufficient documentation

## 2023-11-16 DIAGNOSIS — I2510 Atherosclerotic heart disease of native coronary artery without angina pectoris: Secondary | ICD-10-CM | POA: Insufficient documentation

## 2023-11-16 NOTE — Patient Instructions (Signed)
 Medication Instructions:  Your physician recommends that you continue on your current medications as directed. Please refer to the Current Medication list given to you today.  *If you need a refill on your cardiac medications before your next appointment, please call your pharmacy*  Follow-Up: At [facility], you and your health needs are our priority.  As part of our continuing mission to provide you with exceptional heart care, our providers are all part of one team.  This team includes your primary Cardiologist (physician) and Advanced Practice Providers or APPs (Physician Assistants and Nurse Practitioners) who all work together to provide you with the care you need, when you need it.  Your next appointment:   Follow up with new cardiologist in [place]

## 2023-12-17 ENCOUNTER — Encounter: Payer: Self-pay | Admitting: Radiology

## 2023-12-17 DIAGNOSIS — I10 Essential (primary) hypertension: Secondary | ICD-10-CM | POA: Diagnosis not present

## 2023-12-17 DIAGNOSIS — N3281 Overactive bladder: Secondary | ICD-10-CM | POA: Diagnosis not present

## 2023-12-27 DIAGNOSIS — M6281 Muscle weakness (generalized): Secondary | ICD-10-CM | POA: Diagnosis not present

## 2023-12-27 DIAGNOSIS — R2689 Other abnormalities of gait and mobility: Secondary | ICD-10-CM | POA: Diagnosis not present

## 2023-12-31 DIAGNOSIS — R2689 Other abnormalities of gait and mobility: Secondary | ICD-10-CM | POA: Diagnosis not present

## 2023-12-31 DIAGNOSIS — M6281 Muscle weakness (generalized): Secondary | ICD-10-CM | POA: Diagnosis not present

## 2024-01-03 DIAGNOSIS — R2689 Other abnormalities of gait and mobility: Secondary | ICD-10-CM | POA: Diagnosis not present

## 2024-01-03 DIAGNOSIS — M6281 Muscle weakness (generalized): Secondary | ICD-10-CM | POA: Diagnosis not present

## 2024-01-03 NOTE — Progress Notes (Signed)
 Annual Wellness Visit   Patient Care Team: Kelly [name], Kelly PARAS, MD as PCP - General Kelly Lonni BIRCH, MD as PCP - Cardiology (Cardiology)  Visit Date: 01/14/24   Chief Complaint  Patient presents with  . Annual Exam   Subjective:  Patient: Kelly [name], Female DOB: 12/28/1945, 78 y.o. MRN: [id number] Vitals:   01/14/24 1104  BP: 138/80   Kelly [name] is a 78 y.o. Female who presents today for her Annual Wellness Visit. 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  She has been doing physical therapy as she has had both a knee and hip arthroplasty and she finds that it has been helpful managing her pain.    History of Hyperlipidemia treated with rosuvastatin  20 mg daily and Repatha  140 mg fortnightly.    History of Hypertension treated with Amlodipine  10 mg daily. Blood pressure today is normal  at 138/80.    History of impaired glucose tolerance. She says that she has been trying to practice healthier habits.   Status post conversion right knee arthroplasty to total arthroplasty 12/09/21.  Status post right hip arthroplasty 05/29/2023.    Seen in ER 08/29/22 for hypokalemia, constipation. Given IV potassium chloride , IV fluids, oral potassium chloride . CT abdomen/pelvis showed: 1) No acute finding by CT. No sign of appendicitis. No inflammatory change in the right lower quadrant. Moderate amount of fecal matter  throughout the colon. 2. Moderate hiatal hernia, 3) Previous cholecystectomy. 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 This is probably not of acute relevance, 4) Nonobstructing 3 mm stone in the midportion of the right kidney. Aortic Atherosclerosis. Switched back to simple lisinopril  from combination form.    Saw Dr. Eldonna in August 2022 for lumbar radiculopathy and had epidural steroid injection.  History of right foraminal narrowing L5-S1 and multilevel facet hypertrophy based on MRI in 2021.  Seen in [facility] for urinary frequency.  History of overactive bladder.  Did not tolerate Ditropan  because of side effects including dry mouth.  Main complaint is nocturia.  Has failed Myrbetriq. PTNS recommended by urologist.   Elevated serum creatinine likely due to years of hypertension. CKD stage 3a seen by [facility].  Patient had multiple plastic surgery procedures in 2002 and 2003 including abdominoplasty, breast reduction surgery, bilateral brachioplasty.  History of facial cosmetic surgery with laser peel in 2018.  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 Murrell in 2011.  History of right carpal tunnel syndrome.  Labs 01/14/2024 pending     10/09/2023 Mammogram No mammographic evidence of malignancy. Repeat in one year.    03/08/2023 Colonoscopy One 4 mm polyp in the ascending colon, . Resected and retrieved. Pathology found to be tubular adenoma.  Diverticulosis in the sigmoid colon and in the ascending colon. Melanosis coli. The examination was otherwise normal on direct and retroflexion views. Redundant colon. Repeat not recommended due to age.    Vaccine counseling: Influenza vaccine received today. Declined Covid-19 vaccine and shingles vaccine.    Health maintenance: Eye exam due  Health Maintenance  Topic Date Due  .  Influenza Vaccine  09/14/2023  . OPHTHALMOLOGY EXAM  11/01/2023  . Medicare Annual Wellness (AWV)  12/14/2023  . HEMOGLOBIN A1C   12/20/2023  . COVID-19 Vaccine (4 - 2025-26 season) 01/30/2024 (Originally 10/15/2023)  . Zoster Vaccines- Shingrix (1 of 2) 04/13/2024 (Originally 01/02/1965)  . Diabetic kidney evaluation - eGFR measurement  05/29/2024  . Diabetic kidney evaluation - Urine ACR  06/24/2024  . FOOT EXAM  01/13/2025  . DTaP/Tdap/Td (3 - Td or Tdap) 11/30/2025  . Pneumococcal Vaccine: 50+ Years  Completed  . Bone Density Scan  Completed  . Meningococcal B Vaccine  Aged Out  . Mammogram  Discontinued  . Colonoscopy  Discontinued   Review of Systems  Constitutional:  Negative for fever and malaise/fatigue.  HENT:  Negative for congestion.   Eyes:  Negative for blurred vision.  Respiratory:  Negative for cough and shortness of breath.   Cardiovascular:  Negative for chest pain, palpitations and leg swelling.  Gastrointestinal:  Negative for vomiting.  Musculoskeletal:  Negative for back pain.  Skin:  Negative for rash.  Neurological:  Negative for loss of consciousness and headaches.   Objective:  Vitals: body mass index is 35.94 kg/m. Today's Vitals   01/14/24 1104  BP: 138/80  Pulse: 82  SpO2: 98%  Weight: 184 lb (83.5 kg)  Height: 5' (1.524 m)  PainSc: 0-No pain   Physical Exam Vitals and nursing note reviewed.  Constitutional:      General: She is not in acute distress.    Appearance: Normal appearance. She is not ill-appearing or toxic-appearing.  HENT:     Head: Normocephalic and atraumatic.     Right Ear: Hearing, tympanic membrane, ear canal and external ear normal.     Left Ear: Hearing, tympanic membrane, ear canal and external ear normal.     Mouth/Throat:     Pharynx: Oropharynx is clear.  Eyes:     Extraocular Movements: Extraocular movements intact.     Pupils: Pupils are equal, round, and reactive to light.  Neck:     Thyroid : No thyroid  mass, thyromegaly or thyroid  tenderness.     Vascular: No carotid bruit.  Cardiovascular:     Rate and Rhythm: Normal rate and regular  rhythm. No extrasystoles are present.    Pulses:          Dorsalis pedis pulses are 2+ on the right side and 2+ on the left side.     Heart sounds: Normal heart sounds. No murmur heard.    No friction rub. No gallop.  Pulmonary:     Effort: Pulmonary effort is normal.     Breath sounds: Normal breath sounds. No decreased breath sounds, wheezing, rhonchi or rales.  Chest:     Chest wall: No mass.  Abdominal:     Palpations: Abdomen is soft. There is no hepatomegaly, splenomegaly or mass.     Tenderness: There is no abdominal tenderness.     Hernia: No hernia is present.  Musculoskeletal:     Cervical back: Normal range of motion.     Right lower leg: No edema.     Left lower leg: No edema.  Lymphadenopathy:     Cervical: No cervical adenopathy.     Upper Body:     Right upper body: No supraclavicular adenopathy.     Left upper body: No supraclavicular adenopathy.  Skin:    General: Skin is warm and dry.  Neurological:     General: No focal deficit present.  Mental Status: She is alert and oriented to person, place, and time. Mental status is at baseline.     Sensory: Sensation is intact.     Motor: Motor function is intact. No weakness.     Deep Tendon Reflexes: Reflexes are normal and symmetric.  Psychiatric:        Attention and Perception: Attention normal.        Mood and Affect: Mood normal.        Speech: Speech normal.        Behavior: Behavior normal.        Thought Content: Thought content normal.        Cognition and Memory: Cognition normal.        Judgment: Judgment normal.     Current Outpatient Medications  Medication Instructions  . amLODipine  (NORVASC ) 10 mg, Oral, Daily  . aspirin  81 mg, Oral, 2 times daily  . Multiple Vitamins-Minerals (WOMENS 50+ MULTI VITAMIN PO) 1 tablet, Daily  . Repatha  SureClick 140 mg, Subcutaneous, Every 14 days  . rosuvastatin  (CRESTOR ) 20 mg, Oral, Daily   Past Medical History:  Diagnosis Date  . Arthritis   .  Cataract   . Chronic kidney disease   . Hyperlipidemia   . Hypertension   . Osteoporosis   . Shortness of breath    With activity   Medical/Surgical History Narrative:  Allergic/Intolerant to: No Known Allergies  Past Surgical History:  Procedure Laterality Date  . ABDOMINAL HYSTERECTOMY    . BREAST REDUCTION SURGERY     and lift  . BREAST REDUCTION SURGERY Bilateral   . CARPAL TUNNEL RELEASE  06/2009   right  . CATARACT EXTRACTION    . CHOLECYSTECTOMY    . FACIAL COSMETIC SURGERY  03/2008  . KNEE ARTHROSCOPY  2007   left & right  . KNEE CLOSED REDUCTION Right 02/09/2022   Procedure: CLOSED MANIPULATION RIGHT TOTAL KNEE ARTHROPLASTY;  Surgeon: Vernetta Lonni GRADE, MD;  Location: [facility] OR;  Service: Orthopedics;  Laterality: Right;  . LIPOSUCTION EXTREMITIES     thighs  . PARTIAL KNEE ARTHROPLASTY Right 10/02/2014   Procedure: RIGHT KNEE MEDIAL UNICOMPARTMENTAL ARTHROPLASTY;  Surgeon: Lonni GRADE Vernetta, MD;  Location: [facility];  Service: Orthopedics;  Laterality: Right;  . REDUCTION MAMMAPLASTY     bilateral  . TOTAL HIP ARTHROPLASTY Right 05/29/2023   Procedure: RIGHT TOTAL HIP ARTHOPLASTY;  Surgeon: Vernetta Lonni GRADE, MD;  Location: [facility] OR;  Service: Orthopedics;  Laterality: Right;  . TOTAL KNEE REVISION  10/12/2011   Procedure: TOTAL KNEE REVISION;  Surgeon: Lonni GRADE Vernetta, MD;  Location: [facility];  Service: Orthopedics;  Laterality: Left;  Left Total Knee Revision Arthroplasty  . TOTAL KNEE REVISION Right 12/09/2021   Procedure: CONVERT RIGHT UNI KNEE ARTHROPLASTY TO TOTAL KNEE ARTHROPLASTY;  Surgeon: Vernetta Lonni GRADE, MD;  Location: [facility];  Service: Orthopedics;  Laterality: Right;  . TRIGGER FINGER RELEASE  06/2009   right 4th finger   Family History  Problem Relation Age of Onset  . Heart disease Mother   . Alcohol abuse Father   . Hypertension Father   . Cancer Sister   . Hypertension Sister   . Breast cancer Neg Hx   . Colon cancer Neg Hx    . Colon polyps Neg Hx   . Esophageal cancer Neg Hx   . Stomach cancer Neg Hx   . Rectal cancer Neg Hx    Family History Narrative: Father died of alcoholism. Mother died of heart disease. 1  sister with history of lung cancer. 3 sisters in good health.   Social history: Patient was raised in an orphanage in [place]. Patient's daughter died of metastatic ovarian cancer. Patient previously worked for the [employer] and is currently working as a comptroller. She is divorced. Drinks 2 glasses of wine daily. Does not smoke.  Currently lives in [place].   Most Recent Health Risks Assessment:   Most Recent Social Determinants of Health (Including Hx of Tobacco, Alcohol, and Drug Use) SDOH Screenings   Food Insecurity: No Food Insecurity (06/21/2023)  Housing: Low Risk  (06/21/2023)  Transportation Needs: No Transportation Needs (06/21/2023)  Utilities: Not At Risk (05/29/2023)  Alcohol Screen: Low Risk  (06/21/2023)  Depression (PHQ2-9): Low Risk  (01/14/2024)  Financial Resource Strain: Low Risk  (06/21/2023)  Physical Activity: Insufficiently Active (06/21/2023)  Social Connections: Moderately Integrated (06/21/2023)  Recent Concern: Social Connections - Moderately Isolated (05/29/2023)  Stress: No Stress Concern Present (06/21/2023)  Tobacco Use: Low Risk  (01/14/2024)  Health Literacy: Adequate Health Literacy (12/14/2022)   Social History   Tobacco Use  . Smoking status: Never  . Smokeless tobacco: Never  Vaping Use  . Vaping status: Never Used  Substance Use Topics  . Alcohol use: Yes    Alcohol/week: 1.0 standard drink of alcohol    Types: 1 Glasses of wine per week    Comment: 1 glass of wine per day  . Drug use: No   Most Recent Functional Status Assessment:    05/29/2023    4:17 PM  In your present state of health, do you have any difficulty performing the following activities:  Hearing? 0  Vision? 0  Difficulty concentrating or making decisions? 0  Doing errands, shopping? 0    Most Recent Fall Risk Assessment:    12/14/2022    3:15 PM  Fall Risk   Falls in the past year? 0  Number falls in past yr: 0  Injury with Fall? 0  Risk for fall due to : No Fall Risks  Follow up Falls evaluation completed   Most Recent Anxiety/Depression Screenings:    01/14/2024   11:03 AM 12/14/2022    3:15 PM  PHQ 2/9 Scores  PHQ - 2 Score 0 0  PHQ- 9 Score 0     Most Recent Cognitive Screening:    12/14/2022    3:12 PM  6CIT Screen  What Year? 0 points  What month? 0 points  What time? 0 points  Count back from 20 0 points  Months in reverse 0 points  Repeat phrase 0 points  Total Score 0 points    Results:  Studies Obtained And Personally Reviewed By Me:   10/09/2023 Mammogram No mammographic evidence of malignancy. Repeat in one year.    03/08/2023 Colonoscopy One 4 mm polyp in the ascending colon, . Resected and retrieved. Pathology found to be tubular adenoma.  Diverticulosis in the sigmoid colon and in the ascending colon. Melanosis coli. The examination was otherwise normal on direct and retroflexion views. Redundant colon. Repeat not recommended due to age.     Labs:  CBC w/ Differential Lab Results  Component Value Date   WBC 7.1 05/30/2023   RBC 3.21 (L) 05/30/2023   HGB 9.8 (L) 05/30/2023   HCT 28.5 (L) 05/30/2023   PLT 162 05/30/2023   MCV 88.8 05/30/2023   MCH 30.5 05/30/2023   MCHC 34.4 05/30/2023   RDW 14.5 05/30/2023   MPV 11.0 12/08/2022   LYMPHSABS 1,546 11/25/2021  MONOABS 322 08/12/2015   BASOSABS 11 12/08/2022    Comprehensive Metabolic Panel Lab Results  Component Value Date   NA 136 05/30/2023   K 4.2 05/30/2023   CL 101 05/30/2023   CO2 25 05/30/2023   GLUCOSE 125 (H) 05/30/2023   BUN 24 (H) 05/30/2023   CREATININE 1.46 (H) 05/30/2023   CALCIUM  9.7 05/30/2023   PROT 7.0 12/08/2022   ALBUMIN  4.1 08/29/2022   AST 22 12/08/2022   ALT 10 06/13/2023   ALKPHOS 92 08/29/2022   BILITOT 0.7 12/08/2022   EGFR 37 (L)  01/18/2023   GFRNONAA 36 (L) 05/30/2023   Lipid Panel  Lab Results  Component Value Date   CHOL 95 (L) 06/13/2023   HDL 28 (L) 06/13/2023   LDLCALC 46 06/13/2023   TRIG 113 06/13/2023   A1c Lab Results  Component Value Date   HGBA1C 5.4 06/19/2023    TSH Lab Results  Component Value Date   TSH 2.42 12/08/2022    Assessment & Plan:   Orders Placed This Encounter  Procedures  . CBC with Differential/Platelet  . Comprehensive metabolic panel with GFR  . Hemoglobin A1c  . Lipid panel  . Microalbumin / creatinine urine ratio  . TSH   Hyperlipidemia: treated with rosuvastatin  20 mg daily and Repatha  140 mg fortnightly.    Hypertension: treated with Amlodipine  10 mg daily. Blood pressure today is normal at 138/80.     Nocturia: she wakes up during the night to void her bladder but is sleeping well otherwise.   impaired glucose tolerance: She says that she has been trying to practice healthier habits.    Elevated serum creatinine likely due to years of hypertension. CKD stage 3a seen by [facility].  Labs 01/14/2024 pending     10/09/2023 Mammogram No mammographic evidence of malignancy. Repeat in one year.    03/08/2023 Colonoscopy One 4 mm polyp in the ascending colon, . Resected and retrieved. Pathology found to be tubular adenoma.  Diverticulosis in the sigmoid colon and in the ascending colon. Melanosis coli. The examination was otherwise normal on direct and retroflexion views. Redundant colon. Repeat not recommended due to age.    Vaccine counseling: Influenza vaccine received today. Declined Covid-19 vaccine and shingles vaccines.    Health maintenance: Eye exam due   Return in about 1 year (around 01/13/2025).   Annual Wellness Visit done today including the all of the following: Reviewed patient's Family Medical History Reviewed patient's SDOH and reviewed tobacco, alcohol, and drug use.  Reviewed and updated list of patient's medical  providers Assessment of cognitive impairment was done Assessed patient's functional ability Established a written schedule for health screening services Health Risk Assessent Completed and Reviewed  Discussed health benefits of physical activity, and encouraged her to engage in regular exercise appropriate for her age and condition.    I,Makayla C Reid,acting as a scribe for Kelly JINNY Hailstone, MD.,have documented all relevant documentation on the behalf of Kelly JINNY Hailstone, MD,as directed by  Kelly JINNY Hailstone, MD while in the presence of Kelly JINNY Hailstone, MD.  I, Kelly JINNY Hailstone, MD, have reviewed all documentation for and agree with the above Annual Wellness Visit documentation.  Kelly JINNY Hailstone, MD Internal Medicine 01/14/2024

## 2024-01-07 DIAGNOSIS — M6281 Muscle weakness (generalized): Secondary | ICD-10-CM | POA: Diagnosis not present

## 2024-01-07 DIAGNOSIS — R2689 Other abnormalities of gait and mobility: Secondary | ICD-10-CM | POA: Diagnosis not present

## 2024-01-09 DIAGNOSIS — M6281 Muscle weakness (generalized): Secondary | ICD-10-CM | POA: Diagnosis not present

## 2024-01-09 DIAGNOSIS — R2689 Other abnormalities of gait and mobility: Secondary | ICD-10-CM | POA: Diagnosis not present

## 2024-01-14 ENCOUNTER — Encounter: Payer: Self-pay | Admitting: Internal Medicine

## 2024-01-14 ENCOUNTER — Ambulatory Visit: Payer: Self-pay | Admitting: Internal Medicine

## 2024-01-14 VITALS — BP 138/80 | HR 82 | Ht 60.0 in | Wt 184.0 lb

## 2024-01-14 DIAGNOSIS — R7302 Impaired glucose tolerance (oral): Secondary | ICD-10-CM

## 2024-01-14 DIAGNOSIS — Z1329 Encounter for screening for other suspected endocrine disorder: Secondary | ICD-10-CM | POA: Diagnosis not present

## 2024-01-14 DIAGNOSIS — N1831 Chronic kidney disease, stage 3a: Secondary | ICD-10-CM

## 2024-01-14 DIAGNOSIS — R2689 Other abnormalities of gait and mobility: Secondary | ICD-10-CM | POA: Diagnosis not present

## 2024-01-14 DIAGNOSIS — E7439 Other disorders of intestinal carbohydrate absorption: Secondary | ICD-10-CM

## 2024-01-14 DIAGNOSIS — I10 Essential (primary) hypertension: Secondary | ICD-10-CM | POA: Diagnosis not present

## 2024-01-14 DIAGNOSIS — Z0001 Encounter for general adult medical examination with abnormal findings: Secondary | ICD-10-CM

## 2024-01-14 DIAGNOSIS — M6281 Muscle weakness (generalized): Secondary | ICD-10-CM | POA: Diagnosis not present

## 2024-01-14 DIAGNOSIS — Z860101 Personal history of adenomatous and serrated colon polyps: Secondary | ICD-10-CM | POA: Diagnosis not present

## 2024-01-14 DIAGNOSIS — E782 Mixed hyperlipidemia: Secondary | ICD-10-CM | POA: Diagnosis not present

## 2024-01-14 DIAGNOSIS — Z0000 Encounter for general adult medical examination without abnormal findings: Secondary | ICD-10-CM | POA: Diagnosis not present

## 2024-01-14 DIAGNOSIS — Z23 Encounter for immunization: Secondary | ICD-10-CM

## 2024-01-14 DIAGNOSIS — Z96641 Presence of right artificial hip joint: Secondary | ICD-10-CM | POA: Diagnosis not present

## 2024-01-14 DIAGNOSIS — E786 Lipoprotein deficiency: Secondary | ICD-10-CM

## 2024-01-14 DIAGNOSIS — Z96651 Presence of right artificial knee joint: Secondary | ICD-10-CM

## 2024-01-14 NOTE — Patient Instructions (Addendum)
 It was a pleasure to see you today. Continue same meds and return in one year or as needed. Flu vaccine given.

## 2024-01-15 LAB — COMPREHENSIVE METABOLIC PANEL WITH GFR
AG Ratio: 1.4 (calc) (ref 1.0–2.5)
ALT: 16 U/L (ref 6–29)
AST: 25 U/L (ref 10–35)
Albumin: 4.3 g/dL (ref 3.6–5.1)
Alkaline phosphatase (APISO): 112 U/L (ref 37–153)
BUN/Creatinine Ratio: 14 (calc) (ref 6–22)
BUN: 23 mg/dL (ref 7–25)
CO2: 31 mmol/L (ref 20–32)
Calcium: 10.7 mg/dL — ABNORMAL HIGH (ref 8.6–10.4)
Chloride: 105 mmol/L (ref 98–110)
Creat: 1.63 mg/dL — ABNORMAL HIGH (ref 0.60–0.95)
Globulin: 3 g/dL (ref 1.9–3.7)
Glucose, Bld: 103 mg/dL — ABNORMAL HIGH (ref 65–99)
Potassium: 4.5 mmol/L (ref 3.5–5.3)
Sodium: 142 mmol/L (ref 135–146)
Total Bilirubin: 0.6 mg/dL (ref 0.2–1.2)
Total Protein: 7.3 g/dL (ref 6.1–8.1)
eGFR: 32 mL/min/1.73m2 — ABNORMAL LOW

## 2024-01-15 LAB — CBC WITH DIFFERENTIAL/PLATELET
Absolute Lymphocytes: 908 {cells}/uL (ref 850–3900)
Absolute Monocytes: 211 {cells}/uL (ref 200–950)
Basophils Absolute: 10 {cells}/uL (ref 0–200)
Basophils Relative: 0.3 %
Eosinophils Absolute: 71 {cells}/uL (ref 15–500)
Eosinophils Relative: 2.1 %
HCT: 38 % (ref 35.9–46.0)
Hemoglobin: 11.8 g/dL (ref 11.7–15.5)
MCH: 27 pg (ref 27.0–33.0)
MCHC: 31.1 g/dL — ABNORMAL LOW (ref 31.6–35.4)
MCV: 87 fL (ref 81.4–101.7)
MPV: 11.6 fL (ref 7.5–12.5)
Monocytes Relative: 6.2 %
Neutro Abs: 2200 {cells}/uL (ref 1500–7800)
Neutrophils Relative %: 64.7 %
Platelets: 217 Thousand/uL (ref 140–400)
RBC: 4.37 Million/uL (ref 3.80–5.10)
RDW: 13.5 % (ref 11.0–15.0)
Total Lymphocyte: 26.7 %
WBC: 3.4 Thousand/uL — ABNORMAL LOW (ref 3.8–10.8)

## 2024-01-15 LAB — LIPID PANEL
Cholesterol: 97 mg/dL
HDL: 43 mg/dL — ABNORMAL LOW
LDL Cholesterol (Calc): 35 mg/dL
Non-HDL Cholesterol (Calc): 54 mg/dL
Total CHOL/HDL Ratio: 2.3 (calc)
Triglycerides: 103 mg/dL

## 2024-01-15 LAB — HEMOGLOBIN A1C
Hgb A1c MFr Bld: 5.5 %
Mean Plasma Glucose: 111 mg/dL
eAG (mmol/L): 6.2 mmol/L

## 2024-01-15 LAB — MICROALBUMIN / CREATININE URINE RATIO
Creatinine, Urine: 51 mg/dL (ref 20–275)
Microalb Creat Ratio: 84 mg/g{creat} — ABNORMAL HIGH
Microalb, Ur: 4.3 mg/dL

## 2024-01-15 LAB — TSH: TSH: 2.37 m[IU]/L (ref 0.40–4.50)

## 2024-01-16 DIAGNOSIS — M6281 Muscle weakness (generalized): Secondary | ICD-10-CM | POA: Diagnosis not present

## 2024-01-16 DIAGNOSIS — R2689 Other abnormalities of gait and mobility: Secondary | ICD-10-CM | POA: Diagnosis not present

## 2024-01-23 ENCOUNTER — Ambulatory Visit: Admitting: Orthopaedic Surgery

## 2024-01-28 DIAGNOSIS — M6281 Muscle weakness (generalized): Secondary | ICD-10-CM | POA: Diagnosis not present

## 2024-01-28 DIAGNOSIS — R2689 Other abnormalities of gait and mobility: Secondary | ICD-10-CM | POA: Diagnosis not present

## 2024-01-31 DIAGNOSIS — R2689 Other abnormalities of gait and mobility: Secondary | ICD-10-CM | POA: Diagnosis not present

## 2024-01-31 DIAGNOSIS — M6281 Muscle weakness (generalized): Secondary | ICD-10-CM | POA: Diagnosis not present

## 2024-02-04 ENCOUNTER — Ambulatory Visit: Admitting: Orthopaedic Surgery

## 2024-02-04 ENCOUNTER — Other Ambulatory Visit (INDEPENDENT_AMBULATORY_CARE_PROVIDER_SITE_OTHER): Payer: Self-pay

## 2024-02-04 ENCOUNTER — Encounter: Payer: Self-pay | Admitting: Orthopaedic Surgery

## 2024-02-04 ENCOUNTER — Other Ambulatory Visit: Payer: Self-pay

## 2024-02-04 ENCOUNTER — Telehealth: Payer: Self-pay

## 2024-02-04 DIAGNOSIS — Z96641 Presence of right artificial hip joint: Secondary | ICD-10-CM

## 2024-02-04 NOTE — Telephone Encounter (Signed)
 This was faxed on 01/23/24, Delon will fax it again today.  Copied from CRM #[id number]. Topic: General - Call Back - No Documentation >> Feb 04, 2024  1:53 PM Olam RAMAN wrote: Reason for CRM:   [id number] Jama Dew from  [facility] waiting for a signature for a plan of care for PT Fax: [phone]

## 2024-02-04 NOTE — Progress Notes (Signed)
 The patient is an 78 year old female who is now 8 months status post a right direct anterior total hip arthroplasty to treat significant right hip pain and arthritis.  She has a remote history of both of her knees being replaced and also revisions of the knees.  She states that she is doing well.  She is walking without an assistive device.  She is going through physical therapy to work with the flexibility especially with her knees because her knee flexion is significant limited with both knees.  The right hip is doing well.  Both hips move smoothly and fluidly with no blocks to rotation.  Both knees barely flex past 90 degrees.  An AP pelvis and lateral the right hip shows a well-seated right total hip arthroplasty with no complicating features.  The left hip joint space is well-maintained.  At this point follow-up for her joints can be as needed.  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  If she does have any issues she knows to come and see us .

## 2024-02-05 MED ORDER — ROSUVASTATIN CALCIUM 20 MG PO TABS: 20.0000 mg | ORAL_TABLET | Freq: Every day | ORAL | 3 refills | Status: AC

## 2024-02-11 ENCOUNTER — Telehealth: Payer: Self-pay | Admitting: Pharmacy Technician

## 2024-02-11 NOTE — Telephone Encounter (Signed)
 Patient Advocate Encounter   The patient was approved for a [employer] grant that will help cover the cost of repatha  Total amount awarded, 2500.  Effective: 03/11/24 - 03/10/25   APW:[id number] ERW:[id number] Hmnle:[id number] PI:[id number] [employer] ID: [id number]   Pharmacy provided with approval and processing information. Patient informed via mychart    Faxed to [facility]

## 2025-01-19 ENCOUNTER — Ambulatory Visit: Admitting: Internal Medicine
